# Patient Record
Sex: Female | Born: 1935 | Race: White | Hispanic: No | Smoking: Never smoker
Health system: Southern US, Community
[De-identification: ages and names within clinical notes are randomized; demographics above are authoritative.]

## PROBLEM LIST (undated history)

## (undated) DIAGNOSIS — I10 Essential (primary) hypertension: Secondary | ICD-10-CM

## (undated) DIAGNOSIS — Z8241 Family history of sudden cardiac death: Secondary | ICD-10-CM

## (undated) DIAGNOSIS — E785 Hyperlipidemia, unspecified: Secondary | ICD-10-CM

## (undated) DIAGNOSIS — I5032 Chronic diastolic (congestive) heart failure: Secondary | ICD-10-CM

## (undated) DIAGNOSIS — E559 Vitamin D deficiency, unspecified: Secondary | ICD-10-CM

## (undated) DIAGNOSIS — H3320 Serous retinal detachment, unspecified eye: Secondary | ICD-10-CM

## (undated) DIAGNOSIS — I4819 Other persistent atrial fibrillation: Secondary | ICD-10-CM

## (undated) DIAGNOSIS — I8310 Varicose veins of unspecified lower extremity with inflammation: Secondary | ICD-10-CM

## (undated) DIAGNOSIS — I6789 Other cerebrovascular disease: Secondary | ICD-10-CM

## (undated) DIAGNOSIS — I6529 Occlusion and stenosis of unspecified carotid artery: Secondary | ICD-10-CM

## (undated) HISTORY — DX: Other cerebrovascular disease: I67.89

## (undated) HISTORY — DX: Serous retinal detachment, unspecified eye: H33.20

## (undated) HISTORY — DX: Hyperlipidemia, unspecified: E78.5

## (undated) HISTORY — PX: OTHER SURGICAL HISTORY: SHX169

## (undated) HISTORY — DX: Other persistent atrial fibrillation: I48.19

## (undated) HISTORY — DX: Varicose veins of unspecified lower extremity with inflammation: I83.10

## (undated) HISTORY — PX: RETINAL DETACHMENT SURGERY: SHX105

## (undated) HISTORY — PX: TONSILLECTOMY: SUR1361

## (undated) HISTORY — DX: Family history of sudden cardiac death: Z82.41

## (undated) HISTORY — PX: CATARACT EXTRACTION: SUR2

---

## 1981-02-18 HISTORY — PX: CHOLECYSTECTOMY: SHX55

## 1998-04-07 ENCOUNTER — Emergency Department (HOSPITAL_COMMUNITY): Admission: EM | Admit: 1998-04-07 | Discharge: 1998-04-07 | Payer: Self-pay | Admitting: Emergency Medicine

## 1998-04-07 ENCOUNTER — Encounter: Payer: Self-pay | Admitting: Emergency Medicine

## 1998-10-26 ENCOUNTER — Other Ambulatory Visit: Admission: RE | Admit: 1998-10-26 | Discharge: 1998-10-26 | Payer: Self-pay | Admitting: *Deleted

## 2001-03-19 ENCOUNTER — Other Ambulatory Visit: Admission: RE | Admit: 2001-03-19 | Discharge: 2001-03-19 | Payer: Self-pay | Admitting: *Deleted

## 2003-03-16 DIAGNOSIS — I8310 Varicose veins of unspecified lower extremity with inflammation: Secondary | ICD-10-CM

## 2003-03-16 HISTORY — DX: Varicose veins of unspecified lower extremity with inflammation: I83.10

## 2005-07-23 ENCOUNTER — Emergency Department (HOSPITAL_COMMUNITY): Admission: EM | Admit: 2005-07-23 | Discharge: 2005-07-23 | Payer: Self-pay | Admitting: Emergency Medicine

## 2006-07-25 DIAGNOSIS — E669 Obesity, unspecified: Secondary | ICD-10-CM | POA: Insufficient documentation

## 2008-11-29 DIAGNOSIS — Z8241 Family history of sudden cardiac death: Secondary | ICD-10-CM

## 2008-11-29 HISTORY — DX: Family history of sudden cardiac death: Z82.41

## 2009-01-22 ENCOUNTER — Ambulatory Visit: Payer: Self-pay | Admitting: Family Medicine

## 2009-01-22 ENCOUNTER — Inpatient Hospital Stay (HOSPITAL_COMMUNITY): Admission: EM | Admit: 2009-01-22 | Discharge: 2009-01-26 | Payer: Self-pay | Admitting: Family Medicine

## 2009-01-25 ENCOUNTER — Ambulatory Visit: Payer: Self-pay | Admitting: Emergency Medicine

## 2009-05-16 DIAGNOSIS — E559 Vitamin D deficiency, unspecified: Secondary | ICD-10-CM

## 2009-05-16 HISTORY — DX: Vitamin D deficiency, unspecified: E55.9

## 2009-06-23 ENCOUNTER — Emergency Department (HOSPITAL_COMMUNITY): Admission: EM | Admit: 2009-06-23 | Discharge: 2009-06-23 | Payer: Self-pay | Admitting: Emergency Medicine

## 2009-06-27 DIAGNOSIS — I6789 Other cerebrovascular disease: Secondary | ICD-10-CM

## 2009-06-27 DIAGNOSIS — I10 Essential (primary) hypertension: Secondary | ICD-10-CM

## 2009-06-27 HISTORY — DX: Other cerebrovascular disease: I67.89

## 2009-06-27 HISTORY — DX: Essential (primary) hypertension: I10

## 2010-05-08 LAB — COMPREHENSIVE METABOLIC PANEL
AST: 22 U/L (ref 0–37)
Albumin: 3.7 g/dL (ref 3.5–5.2)
BUN: 13 mg/dL (ref 6–23)
Chloride: 103 mEq/L (ref 96–112)
Creatinine, Ser: 0.42 mg/dL (ref 0.4–1.2)
Potassium: 3.8 mEq/L (ref 3.5–5.1)
Total Bilirubin: 0.8 mg/dL (ref 0.3–1.2)
Total Protein: 7.1 g/dL (ref 6.0–8.3)

## 2010-05-08 LAB — URINALYSIS, ROUTINE W REFLEX MICROSCOPIC
Bilirubin Urine: NEGATIVE
Glucose, UA: NEGATIVE mg/dL
Specific Gravity, Urine: 1.008 (ref 1.005–1.030)
Urobilinogen, UA: 0.2 mg/dL (ref 0.0–1.0)
pH: 7.5 (ref 5.0–8.0)

## 2010-05-08 LAB — URINE MICROSCOPIC-ADD ON

## 2010-05-08 LAB — DIFFERENTIAL
Lymphs Abs: 1.3 10*3/uL (ref 0.7–4.0)
Monocytes Absolute: 0.5 10*3/uL (ref 0.1–1.0)
Monocytes Relative: 7 % (ref 3–12)
Neutro Abs: 4.9 10*3/uL (ref 1.7–7.7)
Neutrophils Relative %: 72 % (ref 43–77)

## 2010-05-08 LAB — CBC
HCT: 43.8 % (ref 36.0–46.0)
Hemoglobin: 14.7 g/dL (ref 12.0–15.0)
MCHC: 33.6 g/dL (ref 30.0–36.0)
Platelets: 214 10*3/uL (ref 150–400)
RDW: 13.6 % (ref 11.5–15.5)

## 2010-05-22 LAB — CBC
HCT: 36.4 % (ref 36.0–46.0)
HCT: 36.5 % (ref 36.0–46.0)
HCT: 37.5 % (ref 36.0–46.0)
Hemoglobin: 12.6 g/dL (ref 12.0–15.0)
MCHC: 33.7 g/dL (ref 30.0–36.0)
MCHC: 34.6 g/dL (ref 30.0–36.0)
MCV: 95.6 fL (ref 78.0–100.0)
MCV: 96.4 fL (ref 78.0–100.0)
MCV: 96.7 fL (ref 78.0–100.0)
Platelets: 171 10*3/uL (ref 150–400)
RBC: 3.78 MIL/uL — ABNORMAL LOW (ref 3.87–5.11)
RBC: 3.88 MIL/uL (ref 3.87–5.11)
WBC: 11.6 10*3/uL — ABNORMAL HIGH (ref 4.0–10.5)
WBC: 13.5 10*3/uL — ABNORMAL HIGH (ref 4.0–10.5)

## 2010-05-22 LAB — CARDIAC PANEL(CRET KIN+CKTOT+MB+TROPI)
CK, MB: 2 ng/mL (ref 0.3–4.0)
Total CK: 85 U/L (ref 7–177)

## 2010-05-22 LAB — DIFFERENTIAL
Eosinophils Absolute: 0.1 10*3/uL (ref 0.0–0.7)
Eosinophils Relative: 1 % (ref 0–5)
Lymphs Abs: 1.4 10*3/uL (ref 0.7–4.0)
Monocytes Relative: 12 % (ref 3–12)

## 2010-05-22 LAB — FUNGUS CULTURE W SMEAR: Fungal Smear: NONE SEEN

## 2010-05-22 LAB — BASIC METABOLIC PANEL
CO2: 28 mEq/L (ref 19–32)
Calcium: 8.4 mg/dL (ref 8.4–10.5)
Creatinine, Ser: 0.54 mg/dL (ref 0.4–1.2)
GFR calc non Af Amer: >60 mL/min (ref >60)

## 2010-05-22 LAB — LEGIONELLA ANTIGEN, URINE

## 2010-05-22 LAB — CULTURE, BLOOD (ROUTINE X 2): Culture: NO GROWTH

## 2010-05-22 LAB — MYCOPLASMA PNEUMONIAE ANTIBODY, IGM

## 2010-05-22 LAB — COCCIDIOIDES ANTIBODIES

## 2010-09-09 ENCOUNTER — Emergency Department (HOSPITAL_COMMUNITY): Payer: Medicare Other

## 2010-09-09 ENCOUNTER — Observation Stay (HOSPITAL_COMMUNITY)
Admission: EM | Admit: 2010-09-09 | Discharge: 2010-09-10 | Disposition: A | Discharge status: Home / Self Care | Payer: Medicare Other | Attending: Internal Medicine | Admitting: Internal Medicine

## 2010-09-09 DIAGNOSIS — E785 Hyperlipidemia, unspecified: Secondary | ICD-10-CM | POA: Insufficient documentation

## 2010-09-09 DIAGNOSIS — J4489 Other specified chronic obstructive pulmonary disease: Secondary | ICD-10-CM | POA: Insufficient documentation

## 2010-09-09 DIAGNOSIS — J449 Chronic obstructive pulmonary disease, unspecified: Secondary | ICD-10-CM | POA: Insufficient documentation

## 2010-09-09 DIAGNOSIS — G319 Degenerative disease of nervous system, unspecified: Secondary | ICD-10-CM | POA: Insufficient documentation

## 2010-09-09 DIAGNOSIS — R55 Syncope and collapse: Principal | ICD-10-CM | POA: Insufficient documentation

## 2010-09-09 DIAGNOSIS — I4949 Other premature depolarization: Secondary | ICD-10-CM | POA: Insufficient documentation

## 2010-09-09 DIAGNOSIS — I517 Cardiomegaly: Secondary | ICD-10-CM | POA: Insufficient documentation

## 2010-09-09 LAB — URINALYSIS, ROUTINE W REFLEX MICROSCOPIC
Glucose, UA: NEGATIVE mg/dL
Hgb urine dipstick: NEGATIVE
Protein, ur: NEGATIVE mg/dL

## 2010-09-09 LAB — CBC
Hemoglobin: 15.1 g/dL — ABNORMAL HIGH (ref 12.0–15.0)
MCH: 32.2 pg (ref 26.0–34.0)
MCV: 93.8 fL (ref 78.0–100.0)
Platelets: 248 10*3/uL (ref 150–400)
RBC: 4.69 MIL/uL (ref 3.87–5.11)

## 2010-09-09 LAB — CK TOTAL AND CKMB (NOT AT ARMC)
Relative Index: INVALID (ref 0.0–2.5)
Total CK: 52 U/L (ref 7–177)

## 2010-09-09 LAB — COMPREHENSIVE METABOLIC PANEL
Albumin: 3.8 g/dL (ref 3.5–5.2)
BUN: 13 mg/dL (ref 6–23)
Calcium: 8.9 mg/dL (ref 8.4–10.5)
Glucose, Bld: 103 mg/dL — ABNORMAL HIGH (ref 70–99)
Sodium: 139 mEq/L (ref 135–145)
Total Protein: 8 g/dL (ref 6.0–8.3)

## 2010-09-09 LAB — DIFFERENTIAL
Eosinophils Absolute: 0 10*3/uL (ref 0.0–0.7)
Lymphs Abs: 1.5 10*3/uL (ref 0.7–4.0)
Monocytes Relative: 7 % (ref 3–12)
Neutrophils Relative %: 71 % (ref 43–77)

## 2010-09-09 LAB — CARDIAC PANEL(CRET KIN+CKTOT+MB+TROPI): Total CK: 46 U/L (ref 7–177)

## 2010-09-09 LAB — TROPONIN I: Troponin I: <0.3 ng/mL (ref <0.30)

## 2010-09-09 NOTE — H&P (Signed)
Julie Haas, Julie Haas              ACCOUNT NO.:  [id number]  MEDICAL RECORD NO.:  [id number]  LOCATION:  WLED                         FACILITY:  [facility]  PHYSICIAN:  Tana Felts, MD     DATE OF BIRTH:  05-07-1925  DATE OF ADMISSION:  09/09/2010 DATE OF DISCHARGE:                             HISTORY & PHYSICAL   CHIEF COMPLAINT:  Syncopal event.  HISTORY OF PRESENT ILLNESS:  This is a fairly healthy 77 year old woman with no significant past medical history who presents today after a 2- minute episode of staring and unresponsiveness at church today.  She remembers the preacher asking everyone to bow their heads in prayer and woke up about 2 minutes later having been lowered to the floor.  She denies any fall.  Apparently her husband was unable to get her attention to get her to wake up.  Her eyes remained open and, as mentioned, she sustained no trauma.  She denies any sort of prodromal syndrome so when she woke up she felt hot, sweaty and said she was seeing stars.  She did have breakfast this morning including her regular coffee which she drinks every day.  She says almost immediately after the episode she felt fine and was attended to by three physicians who were in the church congregation at the time.  She denies any chest pain, trouble breathing, or abdominal symptoms, and says she is at her baseline right now.  REVIEW OF SYSTEMS:  10 system review of systems is otherwise negative.  PAST MEDICAL HISTORY:  Cataract surgery, detached retina, hyperlipidemia, pneumonia, PVCs, cholecystectomy, and tonsillectomy.  SOCIAL HISTORY:  The patient does not smoke or drink.  She lives in [facility] with her husband and is fairly independent.  FAMILY HISTORY:  Multiple female relatives have died in their 47s or younger of a genetic heart condition.  She says it predominately affects men.  Travel history, nothing recent but she has traveled widely in [place] in the  past.  ALLERGIES:  PENICILLIN, CODEINE, and STATINS.  MEDICATIONS:  No home medications.  PHYSICAL EXAM:  VITAL SIGNS:  Temperature 98.1, pulse 68, blood pressure 156/77, respiratory rate 18, saturating 99% on room air. GENERAL:  This is a well-nourished, well-developed, well-groomed and pleasant elderly woman in no acute distress. HEENT:  Pupils are equal, round, reactive to light.  There is a mild irregularity in the left pupil secondary to ophthalmic surgery.  She has moist mucous membranes.  Oropharynx is clear. NECK:  Supple.  No lymphadenopathy. LUNGS:  Clear to auscultation bilaterally. CARDIOVASCULAR:  Regular rate and rhythm.  No murmurs, rubs or gallops although heart sounds are somewhat soft.  ABDOMEN:  Soft, nontender, nondistended.  Normal bowel sounds. EXTREMITIES:  Warm, well-perfused.  No cyanosis, clubbing or edema except for possibly some very mild pedal edema. NEUROLOGIC:  Cranial nerves are intact and symmetric.  She has 5/5 strength in all extremities.  Sensation is intact and her cognition is excellent.  LABORATORY DATA:  One set of cardiac enzymes was normal.  White count 6.6, hemoglobin 15.1, platelets 248, normal differential.  UA was normal.  Chemistries were normal. Chest x-ray:  Mild cardiomegaly and pulmonary hyperinflation, but no  active process. Head CT:  No acute intracranial abnormality, mild-to-moderate generalized atrophy, and mild chronic microvascular ischemic changes which are stable since an exam done in 2007.  ASSESSMENT:  This is an 77 year old woman in generally good health who presents today after an episode of staring and unresponsiveness in church.  Based on her symptoms of sweating and warmth after she awakened, I suspect this is a vagal event although it is unusual that she did not have a prodrome.  It is also possible that she had arrhythmias that were self-limited given her history of PVCs.  She does drink coffee which can  provoke tachyarrhythmias especially in the elderly.  The description somewhat like a petit mal seizure but these are very uncommon to onset in an elderly patient with no prior history of seizures, and if workup is negative and these events recur, I think it might be worthwhile to work them up but I am not sure that it would be useful at this time given her completely normal neuro exam.  PLAN: 1. Admit for observation on telemetry to evaluate for any kind of     arrhythmia.  She might be a candidate for an event monitor in the     future. 2. Echocardiogram to rule out any structural heart disease.  She does     have cardiomegaly on her chest x-ray. 3. Seizure precautions:  Though again I doubt this is a seizure.  At     this point, I have not ordered an EEG but it might be a reasonable     course of action should this is event recur.     Tana Felts, MD     NB/MEDQ  D:  09/09/2010  T:  09/09/2010  Job:  [id number]  Electronically Signed by Tana Felts M.D. on 09/09/2010 11:04:20 PM

## 2010-09-10 DIAGNOSIS — R55 Syncope and collapse: Secondary | ICD-10-CM

## 2010-09-13 NOTE — Discharge Summary (Addendum)
Julie Haas, Julie Haas              ACCOUNT NO.:  [id number]  MEDICAL RECORD NO.:  [id number]  LOCATION:  1401                         FACILITY:  [facility]  PHYSICIAN:  Hartley Barefoot, MD    DATE OF BIRTH:  [birth year]  DATE OF ADMISSION:  09/09/2010 DATE OF DISCHARGE:  09/10/2010                              DISCHARGE SUMMARY   PRIMARY CARE PROVIDER:  Lenon Curt. Chilton Si, M.D.  DISCHARGE DIAGNOSES: 1. Syncope. 2. History of PVCs. 3. Hyperlipidemia.  DISCHARGE MEDICATIONS:  None.  DIAGNOSTIC LABS:  WBCs 6.6, hemoglobin 15.1, hematocrit 44.0, platelets 248,000.  Sodium 139, potassium 3.8, chloride 101, CO2 30, BUN 13, creatinine 0.47.  Urinalysis was negative.  First set of cardiac enzymes; total CK 52, CK-MB 2.4, troponin I less than 0.30.  Second set; total CK 46, CK-MB 2.1, troponin I less and 0.30.  MRSA PCR screening negative.  Magnesium 2.0.  DIAGNOSTIC IMAGING: 1. Chest x-ray done on September 09, 2010 yields stable cardiomegaly and     COPD.  No active disease. 2. CT of the head without contrast yields no acute intracranial     abnormality.  Mild to moderate generalized atrophy and mild chronic     microvascular ischemic changes of the white matter, stable since     2007. 3. A 2D echo done on September 10, 2010 yields an ejection fraction of 60%     to 65%, grade 1 diastolic dysfunction.  CONSULTATIONS:  Rollene Rotunda, MD, [facility] with [place], cardiology.  PROCEDURES:  None.  BRIEF HISTORY:  Julie Haas is a fairly healthy 77 year old female with no significant past medical history, presented to the [facility] ED on September 09, 2010 after a 2-minute episode of staring and unresponsiveness at [place].  She remembers the preacher asked them to bow their head and prayer, and woke up about 2 minutes later have been lower to the floor. She denies any fall.  Apparently, her husband was unable to get her attention to get her to wake up.  Her eyes remained open and as mentioned, she  sustained no trauma.  She denies any short of predominant syndrome, so when she woke up she felt hot, sweaty, and said she was seeing stars.  She did have breakfast at morning including a regular coffee which she drinks every day.  She indicated that most immediately after the episode, she felt fine, was attended 2 by 3 physicians who are at [facility] in the [place] of the time.  She denied any chest pain, trouble breathing.  [facility] were asked to admit her for further evaluation and treatment.  HOSPITAL COURSE BY PROBLEMS: 1. Syncope.  The patient was admitted to telemetry.  Etiology unknown,     there is a concern for arrhythmia, so cardiology was asked to     consult.  The patient does have a history of PVCs.  Mag level was     checked and stated as above.  A 2D echo was done, as stated above.     EKG showed a borderline abnormal QT segment, but otherwise     unremarkable.  The patient was monitored on telemetry during her     hospitalization, and did have  one run of 6 to 7 PVCs.  She was     asymptomatic.  She was seen by cardiology as stated above and an     outpatient stress test scheduled for September 18, 2010 at 9:15.  Her     cardiac enzymes have been negative as stated above.  At the time of     this dictation, patient has had no further episodes. 2. History of PVCs, please see #1. 3. Hyperlipidemia.  The patient is currently not on any medication.     Will follow up with primary care provider for a fasting lipid     panel.  PHYSICAL EXAMINATION:  VITAL SIGNS:  Temperature 98.7, blood pressure 160/82, heart rate 78, respirations 20, sats 93% on room air. GENERAL:  Awake, alert, sitting on the side of bed. CV:  Regular rate and rhythm.  No murmur, gallop, or rub. EXTREMITIES:  Trace lower extremity edema.  Pedal pulses present palpable. RESPIRATORY:  Normal effort.  Breath sounds clear to auscultation bilaterally.  No rhonchi, wheezes, or rales. ABDOMEN:  Soft.   Positive bowel sounds throughout.  Nontender to palpation. NEURO:  Alert and oriented x3.  Speech clear.  Facial symmetry.  ACTIVITY:  As tolerated.  DIET:  Regular.  FOLLOWUP:  She will see Dr. Antoine Poche on October 16, 2010 at 12 noon.  She will have a stress test on September 18, 2010 at 09:15 with [place], Cardiology.  DISPOSITION:  The patient is being discharged to home.  CONDITION ON DISCHARGE:  She is medically stable.  Time spent on this discharge 30 minutes.     Gwenyth Bender, NP   ______________________________ Hartley Barefoot, MD    KMB/MEDQ  D:  09/10/2010  T:  09/10/2010  Job:  [id number]  cc:   Lenon Curt. Chilton Si, M.D. Fax: [phone]  Electronically Signed by Hartley Barefoot MD on 09/13/2010 09:40:48 PM Electronically Signed by Toya Smothers  on 09/16/2010 07:35:54 AM

## 2010-09-14 NOTE — Consult Note (Signed)
Julie Haas, Julie Haas              ACCOUNT NO.:  [id number]  MEDICAL RECORD NO.:  [id number]  LOCATION:  1401                         FACILITY:  [facility]  PHYSICIAN:  Rollene Rotunda, MD, FACCDATE OF BIRTH:  07/10/1925  DATE OF CONSULTATION:  09/10/2010 DATE OF DISCHARGE:  09/10/2010                                CONSULTATION   PRIMARY CARE PHYSICIAN:  Lenon Curt. Chilton Si, M.D.  REASON FOR CONSULTATION:  The patient with syncope.  HISTORY OF PRESENT ILLNESS:  The patient is a lovely 77 year old who has only past cardiac history, apparently includes some PVCs.  This was noted incidentally and she has not had a workup.  She coincidently does have family history in some paternal cousin who has long QT syndrome. She, however, is never up to this point had any problems with presyncope or syncope.  There has been no sudden death in her immediate first- degree relatives.  She was at church.  They have closed their eyes to pray.  Her husband looked over afterwards, and her eyes were open and she was not responding.  She did not fall to the ground.  There was no trauma, seizure activity, or loss of bowel or bladder.  They laid her down on some chairs.  She apparently was somewhat unresponsive for couple of minutes, then her eyes open.  When she awoke, she knew she was in church.  She had no palpitations.  She has had no chest pressure, neck or arm discomfort.  She has had no orthostatic symptoms.  In the emergency room, she has had no enzyme elevations x2.  Head CT demonstrated no acute abnormalities.  EKG just done demonstrated borderline QT prolongation, but no other significant arrhythmias. Echocardiography which we just reviewed was essentially normal.  She has had no further presyncope or syncope.  She has no shortness of breath, PND, or orthopnea.  Of note, she does have ventricular ectopy on telemetry and has had a couple of 6 and 70 runs of nonsustained ventricular tachycardia.  These  have been asymptomatic.  PAST MEDICAL HISTORY: 1. Hyperlipidemia. 2. PVCs. 3. Retinal detachment.  PAST SURGICAL HISTORY: 1. Cholecystectomy. 2. Cataract surgery. 3. Tonsillectomy.  ALLERGIES/INTOLERANCES: 1. PENICILLIN. 2. CODEINE.  MEDICATIONS:  None.  SOCIAL HISTORY:  The patient is married.  She has two children and two grandchildren.  She never smoked cigarettes and does not drink alcohol.  FAMILY HISTORY:  Noncontributory for long QT in first-degree relatives, but she describes sudden death in her paternal grandfather's family. This was a third cousin who died at age 74.  There was another distant cousin who died at age 76.  There has been apparently genetic testing in this family and they have been found to have long QT.  Her father had his first heart attack around in his 14s and died at age 33.  Her brother had his first heart attack at age 52 and died in early age as well.  REVIEW OF SYSTEMS:  As stated in the HPI and negative for all other systems.  PHYSICAL EXAMINATION:  GENERAL:  The patient is in no distress. VITAL SIGNS:  Blood pressure 160/82, heart rate 78 and regular, room air saturation 95%,  afebrile, respiratory rate 16. HEENT:  Eyelids are unremarkable, pupils equal and reactive to light, fundi not visualized.  Oral mucosa unremarkable. NECK:  No jugular venous distention at 45 degrees.  Carotid upstroke brisk and symmetrical.  No bruits, no thyromegaly. LYMPHATICS:  No cervical, axillary, inguinal lymph nodes. LUNGS:  Clear to auscultation bilaterally. BACK:  No costovertebral angle tenderness. CHEST:  Unremarkable. HEART:  PMI not displaced or sustained.  S1-S2 within normal limits.  No S3-S4.  No clicks, no rubs, no murmurs. ABDOMEN:  Flat.  Positive bowel sounds, normal in frequency and pitch. No bruits, rebound, guarding.  No midline pulsatile mass.  No splenomegaly. SKIN:  No rashes, nodules. EXTREMITIES:  2+ pulses throughout.  No edema,  no cyanosis, no clubbing. NEURO:  Oriented to person, place, and time.  Cranial nerves II through XII grossly intact.  Motor grossly intact throughout.  LABS:  Sodium 139, potassium 3.8, BUN 13, creatinine 0.47.  WBCs 6.6, hemoglobin 15.1, platelets 248,000.  Chest x-ray, no acute disease.  EKG; sinus rhythm, rate 76, axis within normal limits, QTC slightly prolonged 470, no acute ST-T wave changes.  ASSESSMENT/PLAN: 1. Loss of consciousness.  The patient has an interesting distant     family history.  She does have ventricular ectopy.  However, at     this point, I cannot put with 2 together as the etiology for loss     of consciousness.  I do plan outpatient evaluation.  Given her risk     factors and ventricular ectopy, a stress perfusion study will be     ordered.  On that same day, apply a 21-day event monitor.  I will     then follow her in the clinic for further     evaluation of her ectopy episode.  I do think followup with her     primary is indicated and any neurologic evaluation if already     planned, should proceed. 2. Long QT, again this was noted.  She should avoid any QT prolonging     drugs and I will evaluate as above.     Rollene Rotunda, MD, [facility]     JH/MEDQ  D:  09/10/2010  T:  09/10/2010  Job:  [id number]  cc:   Lenon Curt. Chilton Si, M.D. Fax: [phone]  Electronically Signed by Rollene Rotunda MD [facility] on 09/14/2010 07:51:45 PM

## 2010-09-18 ENCOUNTER — Encounter (HOSPITAL_COMMUNITY): Payer: Medicare Other | Admitting: Radiology

## 2010-09-18 ENCOUNTER — Telehealth: Payer: Self-pay

## 2010-10-01 ENCOUNTER — Telehealth: Payer: Self-pay

## 2010-10-01 NOTE — Telephone Encounter (Signed)
Call Patient for monitor ,she was very upset that no one has call her about what was find when she was in the hospital so she said she would like to talk to Dr green before  she has this monitor put on her. Per patient said she  Will tell Dr green office to call back if she still need monitor.l

## 2010-10-01 NOTE — Telephone Encounter (Signed)
Talk to patient

## 2010-10-01 NOTE — Telephone Encounter (Deleted)
canc

## 2010-10-08 ENCOUNTER — Encounter: Payer: Self-pay | Admitting: Cardiology

## 2010-10-09 ENCOUNTER — Ambulatory Visit (INDEPENDENT_AMBULATORY_CARE_PROVIDER_SITE_OTHER): Payer: Medicare Other | Admitting: Cardiology

## 2010-10-09 ENCOUNTER — Encounter: Payer: Self-pay | Admitting: Cardiology

## 2010-10-09 DIAGNOSIS — R55 Syncope and collapse: Secondary | ICD-10-CM | POA: Insufficient documentation

## 2010-10-09 DIAGNOSIS — I10 Essential (primary) hypertension: Secondary | ICD-10-CM | POA: Insufficient documentation

## 2010-10-09 NOTE — Progress Notes (Signed)
HPI The patient presents for follow up after a syncopal episode.  I saw her in the hospital.  Echo was OK and no etiology was identified.  She was to have a stress test and event monitor but she canceled these. Since I last saw her she has done well.  The patient denies any new symptoms such as chest discomfort, neck or arm discomfort. There has been no new shortness of breath, PND or orthopnea. There have been no reported palpitations, presyncope or syncope.  She takes care of her aged husband and walks 1/4 mile to the dining hall frequently.   Allergies  Allergen Reactions  . Penicillins     No current outpatient prescriptions on file.    Past Medical History  Diagnosis Date  . Hyperlipidemia   . Arrhythmia     PVC  . Retinal detachment     Past Surgical History  Procedure Date  . Cholecystectomy   . Cataract extraction   . Tonsillectomy     ROS:  As stated in the HPI and negative for all other systems.  PHYSICAL EXAM BP 159/90  Pulse 74  Ht 5\' 5"  (1.651 m)  Wt 196 lb (88.905 kg)  BMI 32.62 kg/m2 GENERAL:  Well appearing HEENT:  Pupils equal round and reactive, fundi not visualized, oral mucosa unremarkable NECK:  No jugular venous distention, waveform within normal limits, carotid upstroke brisk and symmetric, no bruits, no thyromegaly LYMPHATICS:  No cervical, inguinal adenopathy LUNGS:  Clear to auscultation bilaterally BACK:  No CVA tenderness CHEST:  Unremarkable HEART:  PMI not displaced or sustained,S1 and S2 within normal limits, no S3, no S4, no clicks, no rubs, no murmurs ABD:  Flat, positive bowel sounds normal in frequency in pitch, no bruits, no rebound, no guarding, no midline pulsatile mass, no hepatomegaly, no splenomegaly EXT:  2 plus pulses throughout, no edema, no cyanosis no clubbing SKIN:  No rashes no nodules NEURO:  Cranial nerves II through XII grossly intact, motor grossly intact throughout PSYCH:  Cognitively intact, oriented to person place  and time  ASSESSMENT AND PLAN

## 2010-10-09 NOTE — Assessment & Plan Note (Signed)
Her blood pressure is elevated today. She gets it checked occasionally at home but doesn't think it's high. I have given her instructions on how to keep a blood pressure diary. I would be happy to review this.

## 2010-10-09 NOTE — Assessment & Plan Note (Signed)
The patient has had no further cardiac symptoms or syncopal events.  No further work up is indicated at this point.

## 2010-10-16 ENCOUNTER — Encounter: Payer: Medicare Other | Admitting: Cardiology

## 2011-02-03 ENCOUNTER — Emergency Department (HOSPITAL_COMMUNITY)
Admission: EM | Admit: 2011-02-03 | Discharge: 2011-02-03 | Discharge status: Against Medical Advice | Payer: Medicare Other | Attending: Emergency Medicine | Admitting: Emergency Medicine

## 2011-02-03 ENCOUNTER — Encounter (HOSPITAL_COMMUNITY): Payer: Self-pay

## 2011-02-03 DIAGNOSIS — R5381 Other malaise: Secondary | ICD-10-CM | POA: Insufficient documentation

## 2011-02-03 DIAGNOSIS — R5383 Other fatigue: Secondary | ICD-10-CM | POA: Insufficient documentation

## 2011-02-03 NOTE — ED Notes (Signed)
MD at bedside. 

## 2011-02-03 NOTE — ED Notes (Signed)
Per ems- pt has had 3 falls since end of November, latest one being yesterday. Pt family thought he was more lethargic than usual today and having more weakness than usual and has been unable to stand. Pt family noted more left sided weakness, but EMS noted equal strengths bilaterally. Pt has been lethargic for EMS.

## 2011-02-03 NOTE — ED Notes (Signed)
Family at bedside. 

## 2011-02-13 ENCOUNTER — Emergency Department (HOSPITAL_COMMUNITY)
Admission: EM | Admit: 2011-02-13 | Discharge: 2011-02-13 | Disposition: A | Discharge status: Home / Self Care | Payer: Medicare Other | Attending: Emergency Medicine | Admitting: Emergency Medicine

## 2011-02-13 ENCOUNTER — Encounter (HOSPITAL_COMMUNITY): Payer: Self-pay | Admitting: *Deleted

## 2011-02-13 DIAGNOSIS — S0500XA Injury of conjunctiva and corneal abrasion without foreign body, unspecified eye, initial encounter: Secondary | ICD-10-CM

## 2011-02-13 DIAGNOSIS — X58XXXA Exposure to other specified factors, initial encounter: Secondary | ICD-10-CM | POA: Insufficient documentation

## 2011-02-13 DIAGNOSIS — H109 Unspecified conjunctivitis: Secondary | ICD-10-CM | POA: Insufficient documentation

## 2011-02-13 DIAGNOSIS — S058X9A Other injuries of unspecified eye and orbit, initial encounter: Secondary | ICD-10-CM | POA: Insufficient documentation

## 2011-02-13 MED ORDER — TOBRAMYCIN 0.3 % OP SOLN
2.0000 [drp] | Freq: Once | OPHTHALMIC | Status: AC
  Administered 2011-02-13: 2 [drp] via OPHTHALMIC
  Filled 2011-02-13: qty 5

## 2011-02-13 NOTE — ED Notes (Signed)
Pt states "I woke up around 0530 with my right eye hurting, my husband is dying & I've been with him a lot"; pt presents with ROD redness, pt describes "as it feels like grit is in it"

## 2011-02-13 NOTE — ED Notes (Signed)
Mother states "she has a nebulizer @ home, was on prednisone x 2 wks ago, but she started coughing on Christmas Eve"

## 2011-02-13 NOTE — ED Provider Notes (Signed)
History     CSN: [id number]  Arrival date & time 02/13/11  0717   First MD Initiated Contact with Patient 02/13/11 [phone]      Chief Complaint  Patient presents with  . Eye Pain    (Consider location/radiation/quality/duration/timing/severity/associated sxs/prior treatment) Patient is a 77 y.o. female presenting with eye pain.  Eye Pain  pt c/o right eye pain, irritation, redness and scant drainage since this morning. Pt says felt as if sand or 'grit' was in eye. No change in vision. No headache. No nv. No nasal congestion, cough or uri symptoms. No fever or chills. No specific exacerbating or allev factors.   Past Medical History  Diagnosis Date  . Hyperlipidemia   . Arrhythmia     PVC  . Retinal detachment   . Renal disorder     Past Surgical History  Procedure Date  . Cholecystectomy   . Cataract extraction   . Tonsillectomy   . Keratosis     Family History  Problem Relation Age of Onset  . Arrhythmia      Long QT in first degree relatives,  . Heart attack Father 40  . Heart attack Brother 31    History  Substance Use Topics  . Smoking status: Never Smoker   . Smokeless tobacco: Not on file  . Alcohol Use: No    OB History    Grav Para Term Preterm Abortions TAB SAB Ect Mult Living                  Review of Systems  Constitutional: Negative for fever and chills.  Eyes: Positive for pain. Negative for visual disturbance.  Respiratory: Negative for cough.   Skin: Negative for rash.    Allergies  Codeine; Diuretic; Lipitor; Penicillins; and Pravachol  Home Medications  No current outpatient prescriptions on file.  BP 173/87  Pulse 93  Temp(Src) 98.6 F (37 C) (Oral)  Resp 16  Wt 195 lb (88.451 kg)  SpO2 93%  Physical Exam  Nursing note and vitals reviewed. Constitutional: She is oriented to person, place, and time. She appears well-developed and well-nourished. No distress.  HENT:  Head: Atraumatic.  Eyes: Pupils are equal, round,  and reactive to light. No scleral icterus.       Right conj injected, scant matting on lashes. Pupils equal, 3-4 mm, briskly reactive. No corneal clouding.  w fluorescein staining, small right corneal abrasion. Lid everted, no fb seen.   Neck: Neck supple. No tracheal deviation present.  Cardiovascular: Normal rate.   Pulmonary/Chest: Effort normal. No respiratory distress.  Abdominal: Normal appearance. She exhibits no distension.  Musculoskeletal: She exhibits no edema.  Neurological: She is alert and oriented to person, place, and time.  Skin: Skin is warm and dry. No rash noted.       No facial skin rash/shingles noted.   Psychiatric: She has a normal mood and affect.    ED Course  Procedures (including critical care time)     MDM  Tetracaine - relief of symptoms. Tonopen, IOP on right 15. Pt without corrective glasses, [facility] noted. As conjunctivitis, small corneal abrasion will rx tobrex drops.          Suzi Roots, MD 02/13/11 [phone]

## 2011-07-20 ENCOUNTER — Encounter: Payer: Self-pay | Admitting: Family Medicine

## 2011-07-20 ENCOUNTER — Ambulatory Visit (INDEPENDENT_AMBULATORY_CARE_PROVIDER_SITE_OTHER): Payer: Medicare Other | Admitting: Family Medicine

## 2011-07-20 VITALS — BP 164/86 | HR 72 | Temp 97.6°F | Resp 16 | Ht 64.5 in | Wt 193.6 lb

## 2011-07-20 DIAGNOSIS — R55 Syncope and collapse: Secondary | ICD-10-CM

## 2011-07-20 DIAGNOSIS — R5381 Other malaise: Secondary | ICD-10-CM

## 2011-07-20 DIAGNOSIS — R269 Unspecified abnormalities of gait and mobility: Secondary | ICD-10-CM

## 2011-07-20 DIAGNOSIS — R5383 Other fatigue: Secondary | ICD-10-CM

## 2011-07-20 DIAGNOSIS — R2681 Unsteadiness on feet: Secondary | ICD-10-CM

## 2011-07-20 LAB — COMPREHENSIVE METABOLIC PANEL
ALT: 14 U/L (ref 0–35)
AST: 22 U/L (ref 0–37)
Albumin: 3.7 g/dL (ref 3.5–5.2)
Alkaline Phosphatase: 72 U/L (ref 39–117)
BUN: 13 mg/dL (ref 6–23)
CO2: 29 mEq/L (ref 19–32)
Calcium: 9 mg/dL (ref 8.4–10.5)
Chloride: 103 mEq/L (ref 96–112)
Creat: 0.71 mg/dL (ref 0.50–1.10)
Glucose, Bld: 124 mg/dL — ABNORMAL HIGH (ref 70–99)
Potassium: 4 mEq/L (ref 3.5–5.3)
Sodium: 139 mEq/L (ref 135–145)
Total Bilirubin: 0.3 mg/dL (ref 0.3–1.2)
Total Protein: 7.1 g/dL (ref 6.0–8.3)

## 2011-07-20 LAB — POCT CBC
Granulocyte percent: 54.4 %G (ref 37–80)
HCT, POC: 40 % (ref 37.7–47.9)
Hemoglobin: 12.9 g/dL (ref 12.2–16.2)
Lymph, poc: 1.8 (ref 0.6–3.4)
MCH, POC: 30.3 pg (ref 27–31.2)
MCHC: 32.3 g/dL (ref 31.8–35.4)
MCV: 93.8 fL (ref 80–97)
MID (cbc): 0.4 (ref 0–0.9)
MPV: 9.7 fL (ref 0–99.8)
POC Granulocyte: 2.6 (ref 2–6.9)
POC LYMPH PERCENT: 37.8 %L (ref 10–50)
POC MID %: 7.8 %M (ref 0–12)
Platelet Count, POC: 226 10*3/uL (ref 142–424)
RBC: 4.26 M/uL (ref 4.04–5.48)
RDW, POC: 15.6 %
WBC: 4.7 10*3/uL (ref 4.6–10.2)

## 2011-07-20 LAB — POCT UA - MICROSCOPIC ONLY
Casts, Ur, LPF, POC: NEGATIVE
Crystals, Ur, HPF, POC: NEGATIVE
Mucus, UA: NEGATIVE
Yeast, UA: NEGATIVE

## 2011-07-20 LAB — POCT URINALYSIS DIPSTICK
Bilirubin, UA: NEGATIVE
Glucose, UA: NEGATIVE
Ketones, UA: NEGATIVE
Leukocytes, UA: NEGATIVE
Nitrite, UA: NEGATIVE
Protein, UA: NEGATIVE
Spec Grav, UA: 1.01
Urobilinogen, UA: 0.2
pH, UA: 6.5

## 2011-07-20 LAB — VITAMIN B12: Vitamin B-12: 665 pg/mL (ref 211–911)

## 2011-07-20 LAB — TSH: TSH: 2.16 u[IU]/mL (ref 0.350–4.500)

## 2011-07-20 NOTE — Progress Notes (Signed)
78 yo woman in usual state of health until Thursday when she felt something was wrong.  She had nurse at [facility] check her and nothing abnormal was found. Today patient was at program outside.  She felt funny, went inside to the bathroom stall and fell on the toilet seat briefly losing consciousness.  She got back up and spoke with the nurse.    She is not seeing double or feeling dizzy.  Objective:  Alert, articulate and cooperative. HEENT:  unremarkable Neck: no bruits, supple, no adenopathy Chest: clear, non tender, no ecchymoses Heart: reg, no murmur Skin: no ecch.  Ext: trace edema

## 2011-07-20 NOTE — Progress Notes (Signed)
Is an 78 year old woman, widowed this last January, who comes in for symptoms of syncope, and malaise.  She started feeling funny in her head on Thursday at friend's home last. She was evaluated by the nurse who found nothing wrong including a normal blood pressure. She went about her usual daily activities over the last 48 hours. Today she went to a program which was outdoors and after sitting in the sun for a while, she got up to go to the bathroom. When she went to the bathroom she says she lost consciousness and fell onto the toilet seat and immediately came to without hitting anything or injuring herself. She came back out of the bathroom and went to the nurse who said she didn't look well. She was checked by the nurse who didn't find anything wrong, but because of the syncopal symptoms, patient was sent over to our facility for further evaluation.  Patient denies chest pain, headache, weakness. She's had no recent cough, fever, urinary symptoms, abdominal pain, or shortness of breath.  She has a family history of arrhythmias. Currently she is being treated for high blood pressure.  Was widowed last January and she wonders if the stress of her husband's death could be related to this episode.  Objective: Very pleasant, cooperative woman in no distress.  HEENT: Unremarkable Neck: Supple, no adenopathy or thyromegaly, no bruits Chest: Clear to auscultation without ecchymosis Heart: Regular, no murmur or gallop Abdomen: Soft nontender Extremities: Unremarkable with no ecchymosis or bony abnormality. She demonstrates a stable gait. Extremities: 1+ edema of the feet. Skin: No ecchymoses noted, no abrasions. Neurological: Normal mental status, normal cranial nerves III through XII, symmetric motor exam and sensory exam. Patient has a wide-based gait but this seems stable.  EKG:  Normal sinus Results for orders placed in visit on 07/20/11  POCT CBC      Component Value Range   WBC 4.7  4.6 -  10.2 (K/uL)   Lymph, poc 1.8  0.6 - 3.4    POC LYMPH PERCENT 37.8  10 - 50 (%L)   MID (cbc) 0.4  0 - 0.9    POC MID % 7.8  0 - 12 (%M)   POC Granulocyte 2.6  2 - 6.9    Granulocyte percent 54.4  37 - 80 (%G)   RBC 4.26  4.04 - 5.48 (M/uL)   Hemoglobin 12.9  12.2 - 16.2 (g/dL)   HCT, POC 16.1  09.6 - 47.9 (%)   MCV 93.8  80 - 97 (fL)   MCH, POC 30.3  27 - 31.2 (pg)   MCHC 32.3  31.8 - 35.4 (g/dL)   RDW, POC 04.5     Platelet Count, POC 226  142 - 424 (K/uL)   MPV 9.7  0 - 99.8 (fL)  POCT URINALYSIS DIPSTICK      Component Value Range   Color, UA yellow     Clarity, UA clear     Glucose, UA neg     Bilirubin, UA neg     Ketones, UA neg     Spec Grav, UA 1.010     Blood, UA trace     pH, UA 6.5     Protein, UA neg     Urobilinogen, UA 0.2     Nitrite, UA neg     Leukocytes, UA Negative    POCT UA - MICROSCOPIC ONLY      Component Value Range   WBC, Ur, HPF, POC rare  RBC, urine, microscopic 0-2     Bacteria, U Microscopic trace     Mucus, UA neg     Epithelial cells, urine per micros 0-2     Crystals, Ur, HPF, POC neg     Casts, Ur, LPF, POC neg     Yeast, UA neg     Assessment: Vague symptoms without clear causality. I am at a loss to explain her symptoms at the same time I am somewhat concerned with the syncopal nature.  Plan: Start the aspirin daily and followup with Dr. Murray Hodgkins next week C. meds and thyroid and B12 are pending

## 2011-07-20 NOTE — Patient Instructions (Signed)
Several tests are pending.  I want you to see Dr. Chilton Si next week in follow up and we will be checking your pending labs in the next 24 hours   Syncope You have had a fainting (syncopal) spell. A fainting episode is a sudden, short-lived loss of consciousness. It results in complete recovery. It occurs because there has been a temporary shortage of oxygen and/or sugar (glucose) to the brain. CAUSES   Blood pressure pills and other medications that may lower blood pressure below normal. Sudden changes in posture (sudden standing).   Over-medication. Take your medications as directed.   Standing too long. This can cause blood to pool in the legs.   Seizure disorders.   Low blood sugar (hypoglycemia) of diabetes. This more commonly causes coma.   Bearing down to go to the bathroom. This can cause your blood pressure to rise suddenly. Your body compensates by making the blood pressure too low when you stop bearing down.   Hardening of the arteries where the brain temporarily does not receive enough blood.   Irregular heart beat and circulatory problems.   Fear, emotional distress, injury, sight of blood, or illness.  Your caregiver will send you home if the syncope was from non-worrisome causes (benign). Depending on your age and health, you may stay to be monitored and observed. If you return home, have someone stay with you if your caregiver feels that is desirable. It is very important to keep all follow-up referrals and appointments in order to properly manage this condition. This is a serious problem which can lead to serious illness and death if not carefully managed.  WARNING: Do not drive or operate machinery until your caregiver feels that it is safe for you to do so. SEEK IMMEDIATE MEDICAL CARE IF:   You have another fainting episode or faint while lying or sitting down. DO NOT DRIVE YOURSELF. Call 911 if no other help is available.   You have chest pain, are feeling sick to your  stomach (nausea), vomiting or abdominal pain.   You have an irregular heartbeat or one that is very fast (pulse over 120 beats per minute).   You have a loss of feeling in some part of your body or lose movement in your arms or legs.   You have difficulty with speech, confusion, severe weakness, or visual problems.   You become sweaty and/or feel light headed.  Make sure you are rechecked as instructed. Document Released: 02/04/2005 Document Revised: 01/24/2011 Document Reviewed: 09/25/2006 [facility] Patient Information 2012 [place], [place].

## 2011-11-17 ENCOUNTER — Encounter (HOSPITAL_COMMUNITY): Payer: Self-pay | Admitting: *Deleted

## 2011-11-17 ENCOUNTER — Observation Stay (HOSPITAL_COMMUNITY)
Admission: EM | Admit: 2011-11-17 | Discharge: 2011-11-18 | Disposition: A | Discharge status: Home / Self Care | Payer: Medicare Other | Attending: Internal Medicine | Admitting: Internal Medicine

## 2011-11-17 DIAGNOSIS — I4729 Other ventricular tachycardia: Secondary | ICD-10-CM | POA: Insufficient documentation

## 2011-11-17 DIAGNOSIS — R55 Syncope and collapse: Principal | ICD-10-CM | POA: Insufficient documentation

## 2011-11-17 DIAGNOSIS — I472 Ventricular tachycardia, unspecified: Secondary | ICD-10-CM | POA: Insufficient documentation

## 2011-11-17 DIAGNOSIS — I10 Essential (primary) hypertension: Secondary | ICD-10-CM | POA: Insufficient documentation

## 2011-11-17 LAB — POCT I-STAT, CHEM 8
BUN: 17 mg/dL (ref 6–23)
Calcium, Ion: 1.17 mmol/L (ref 1.13–1.30)
TCO2: 29 mmol/L (ref 0–100)

## 2011-11-17 LAB — POCT I-STAT TROPONIN I: Troponin i, poc: 0.01 ng/mL (ref 0.00–0.08)

## 2011-11-17 MED ORDER — ONDANSETRON HCL 4 MG/2ML IJ SOLN: 4.0000 mg | Freq: Three times a day (TID) | INTRAMUSCULAR | Status: DC | PRN

## 2011-11-17 NOTE — ED Notes (Signed)
Assisted Living  No c/o fever, feeling bad

## 2011-11-17 NOTE — ED Notes (Signed)
Patients only complaint is feeling hot.  Stated she was at a religious event and felt hot.  Had a syncopal episode several years back but not since then.l

## 2011-11-17 NOTE — ED Notes (Signed)
Patient was at a church event and became very hot and felt like she was going to pass out.  Became cool and clammy.  Denies passing out but felt light headed and very hot.  Stated this happened once before about 1 year ago.

## 2011-11-17 NOTE — ED Provider Notes (Addendum)
History     CSN: [id number]  Arrival date & time 11/17/11  2036   First MD Initiated Contact with Patient 11/17/11 2048      Chief Complaint  Patient presents with  . Near Syncope     HPI Patient was at a church event and became very hot and felt like she was going to pass out. Became cool and clammy. Denies passing out but felt light headed and very hot. Stated this happened once before about 1 year ago.   Past Medical History  Diagnosis Date  . Hyperlipidemia   . Arrhythmia     PVC  . Retinal detachment   . Renal disorder     Past Surgical History  Procedure Date  . Cholecystectomy   . Cataract extraction   . Tonsillectomy   . Keratosis     Family History  Problem Relation Age of Onset  . Arrhythmia      Long QT in first degree relatives,  . Heart attack Father 37  . Heart disease Father   . Heart attack Brother 31  . Heart disease Brother     History  Substance Use Topics  . Smoking status: Never Smoker   . Smokeless tobacco: Not on file  . Alcohol Use: No    OB History    Grav Para Term Preterm Abortions TAB SAB Ect Mult Living                  Review of Systems  All other systems reviewed and are negative.    Allergies  Codeine; Diuretic; Lipitor; Penicillins; and Pravachol  Home Medications   Current Outpatient Rx  Name Route Sig Dispense Refill  . LOSARTAN POTASSIUM 50 MG PO TABS Oral Take 50 mg by mouth daily.    Marland Kitchen VITAMIN E PO Oral Take 1 tablet by mouth daily.      BP 137/71  Pulse 71  Temp 98 F (36.7 C) (Oral)  Resp 21  SpO2 95%  Physical Exam  Nursing note and vitals reviewed. Constitutional: She is oriented to person, place, and time. She appears well-developed. No distress.  HENT:  Head: Normocephalic and atraumatic.  Eyes: Pupils are equal, round, and reactive to light.  Neck: Normal range of motion.  Cardiovascular: Normal rate and intact distal pulses.  Exam reveals no friction rub.   No murmur  heard. Pulmonary/Chest: No respiratory distress. She has no wheezes. She has no rales.  Abdominal: Normal appearance. She exhibits no distension.  Musculoskeletal: Normal range of motion.  Neurological: She is alert and oriented to person, place, and time. No cranial nerve deficit.  Skin: Skin is warm and dry. No rash noted.  Psychiatric: She has a normal mood and affect. Her behavior is normal.    ED Course  Procedures (including critical care time) Sinus rhythm Rate = 68 .  Ventricular premature complexes Choroid progression anteriorly Borderline repolarization abnormality Labs Reviewed  POCT I-STAT, CHEM 8 - Abnormal; Notable for the following:    Glucose, Bld 103 (*)     All other components within normal limits  POCT I-STAT TROPONIN I   No results found.   1. Syncope       MDM          Nelia Shi, MD 11/17/11 2317  Nelia Shi, MD 11/17/11 [phone]

## 2011-11-18 ENCOUNTER — Encounter (HOSPITAL_COMMUNITY): Payer: Self-pay | Admitting: Internal Medicine

## 2011-11-18 DIAGNOSIS — R55 Syncope and collapse: Principal | ICD-10-CM

## 2011-11-18 DIAGNOSIS — I10 Essential (primary) hypertension: Secondary | ICD-10-CM

## 2011-11-18 LAB — COMPREHENSIVE METABOLIC PANEL
Albumin: 3.4 g/dL — ABNORMAL LOW (ref 3.5–5.2)
BUN: 12 mg/dL (ref 6–23)
CO2: 26 mEq/L (ref 19–32)
Chloride: 102 mEq/L (ref 96–112)
Creatinine, Ser: 0.52 mg/dL (ref 0.50–1.10)
GFR calc non Af Amer: 86 mL/min — ABNORMAL LOW (ref >90)
Total Bilirubin: 0.5 mg/dL (ref 0.3–1.2)

## 2011-11-18 LAB — CBC WITH DIFFERENTIAL/PLATELET
Lymphocytes Relative: 29 % (ref 12–46)
Lymphs Abs: 1.7 10*3/uL (ref 0.7–4.0)
Neutro Abs: 3.5 10*3/uL (ref 1.7–7.7)
Neutrophils Relative %: 58 % (ref 43–77)
Platelets: 209 10*3/uL (ref 150–400)
RBC: 4.61 MIL/uL (ref 3.87–5.11)
WBC: 6 10*3/uL (ref 4.0–10.5)

## 2011-11-18 LAB — TSH: TSH: 2.068 u[IU]/mL (ref 0.350–4.500)

## 2011-11-18 LAB — TROPONIN I: Troponin I: <0.3 ng/mL (ref <0.30)

## 2011-11-18 MED ORDER — ONDANSETRON HCL 4 MG/2ML IJ SOLN: 4.0000 mg | Freq: Four times a day (QID) | INTRAMUSCULAR | Status: DC | PRN

## 2011-11-18 MED ORDER — SODIUM CHLORIDE 0.9 % IV SOLN
INTRAVENOUS | Status: DC
  Administered 2011-11-18: 1000 mL via INTRAVENOUS

## 2011-11-18 MED ORDER — ONDANSETRON HCL 4 MG PO TABS: 4.0000 mg | ORAL_TABLET | Freq: Four times a day (QID) | ORAL | Status: DC | PRN

## 2011-11-18 MED ORDER — SODIUM CHLORIDE 0.9 % IJ SOLN
3.0000 mL | Freq: Two times a day (BID) | INTRAMUSCULAR | Status: DC
  Administered 2011-11-18: 3 mL via INTRAVENOUS

## 2011-11-18 MED ORDER — ACETAMINOPHEN 650 MG RE SUPP: 650.0000 mg | Freq: Four times a day (QID) | RECTAL | Status: DC | PRN

## 2011-11-18 MED ORDER — HYDRALAZINE HCL 20 MG/ML IJ SOLN
10.0000 mg | INTRAMUSCULAR | Status: DC | PRN
  Administered 2011-11-18: 10 mg via INTRAVENOUS
  Filled 2011-11-18: qty 0.5

## 2011-11-18 MED ORDER — METOPROLOL TARTRATE 25 MG PO TABS: 25.0000 mg | ORAL_TABLET | Freq: Two times a day (BID) | ORAL | Status: DC

## 2011-11-18 MED ORDER — ACETAMINOPHEN 325 MG PO TABS: 650.0000 mg | ORAL_TABLET | Freq: Four times a day (QID) | ORAL | Status: DC | PRN

## 2011-11-18 MED ORDER — METOPROLOL TARTRATE 25 MG PO TABS
25.0000 mg | ORAL_TABLET | Freq: Two times a day (BID) | ORAL | Status: DC
  Filled 2011-11-18 (×2): qty 1

## 2011-11-18 MED ORDER — LOSARTAN POTASSIUM 50 MG PO TABS
50.0000 mg | ORAL_TABLET | Freq: Every day | ORAL | Status: DC
  Filled 2011-11-18: qty 1

## 2011-11-18 NOTE — Progress Notes (Signed)
Utilization review complete 

## 2011-11-18 NOTE — Discharge Summary (Signed)
Physician Discharge Summary  Patient ID: NOVALEE HORSFALL MRN: [id number] DOB/AGE: April 28, 1925 78 y.o.  Admit date: 11/17/2011 Discharge date: 11/18/2011  Primary Care Physician:  Kimber Relic, MD  Discharge Diagnoses:    .Syncope ?cardiac/recurrent  - NSVT on tele .HTN (hypertension)  Consults:  [facility]  Discharge Medications:   Medication List     As of 11/18/2011  9:47 AM    TAKE these medications         losartan 50 MG tablet   Commonly known as: COZAAR   Take 50 mg by mouth daily.      metoprolol tartrate 25 MG tablet   Commonly known as: LOPRESSOR   Take 1 tablet (25 mg total) by mouth 2 (two) times daily.      VITAMIN E PO   Take 1 tablet by mouth daily.         Brief H and P: For complete details please refer to admission H and P, but in brief 78 year-old female with history of hypertension and strong family history of cardiac arrhythmias with patient's brother having died at age 50 and niece dying at age 29 from cardiac arrhythmia presented to the ER because patient had an episode of loss of consciousness. Patient was at church last evening around 7 PM patient suddenly lost consciousness while she was sitting. She had no prodromal symptoms. She had sweating after she has lost consciousness. Patient was admitted with similar symptoms last July in 2012 and at that time 2-D echo showed EF of 60-65% and CT head was negative   Hospital Course:    *Syncope: recurrent ?due to cardiac arrhythmia. Patient was admitted under observation and cardiology was consulted. Patient had similar event a year ago when she had runs of NSVT. Troponin x3 were negative, EKG per admitting M.D.was unremarkable. Metoprolol 25 mgBID was added per cardiology recommendations. Patient did not want to stay in-patient for any other workup. Patient was cleared for discharge by cardiology, Dr Antoine Poche. She will need event monitor if she has another recurring syncopal episode.    HTN  (hypertension): Cont betablocker and losartan   Day of Discharge BP 161/79  Pulse 74  Temp 98 F (36.7 C) (Oral)  Resp 20  Ht 5\' 6"  (1.676 m)  Wt 89.6 kg (197 lb 8.5 oz)  BMI 31.88 kg/m2  SpO2 94%  Physical Exam: General: Alert and awake oriented x3 not in any acute distress. HEENT: anicteric sclera, pupils reactive to light and accommodation CVS: S1-S2 clear no murmur rubs or gallops Chest: clear to auscultation bilaterally, no wheezing rales or rhonchi Abdomen: soft nontender, nondistended, normal bowel sounds, no organomegaly Extremities: no cyanosis, clubbing or edema noted bilaterally Neuro: Cranial nerves II-XII intact, no focal neurological deficits   The results of significant diagnostics from this hospitalization (including imaging, microbiology, ancillary and laboratory) are listed below for reference.    LAB RESULTS: Basic Metabolic Panel:  Lab 11/17/11 4098  NA 140  K 4.2  CL 101  CO2 --  GLUCOSE 103*  BUN 17  CREATININE 0.90  CALCIUM --  MG --  PHOS --   CBC:  Lab 11/17/11 2207  WBC --  NEUTROABS --  HGB 14.6  HCT 43.0  MCV --  PLT --   Cardiac Enzymes:  Lab 11/18/11 0258  CKTOTAL --  CKMB --  CKMBINDEX --  TROPONINI <0.30    Significant Diagnostic Studies:  No results found.   Disposition and Follow-up:     Discharge  Orders    Future Orders Please Complete By Expires   Diet - low sodium heart healthy      Increase activity slowly      Discharge instructions      Comments:   Please check your BP everyday, if you have BP<110 consistently, then stop cozaar.       DISPOSITION: home  DIET: heart healthy diet ACTIVITY: as tolerated   DISCHARGE FOLLOW-UP Follow-up Information    Follow up with GREEN, Lenon Curt, MD. Schedule an appointment as soon as possible for a visit in 10 days. (for hospital follow-up)    Contact information:   [address] Jeanella Anton [place] [address] [phone]       Follow up with Rollene Rotunda,  MD. Schedule an appointment as soon as possible for a visit in 2 weeks.   Contact information:   [address] Jaclyn Prime [place] [address] [phone]          Time spent on Discharge: 35 mins  Signed:   RAI,RIPUDEEP M.D. [facility] 11/18/2011, 9:47 AM Pager: [phone]

## 2011-11-18 NOTE — H&P (Signed)
Julie Haas is an 78 y.o. female.   Patient was seen and examined on November 18, 2011. PCP - Dr. Murray Hodgkins. Chief Complaint: Loss of consciousness. HPI: 78 year-old female with history of hypertension and strong family history of cardiac arrhythmias with patient's brother having died at age 110 and niece dying at age 31 from cardiac arrhythmia presented to the ER because patient had an episode of loss of consciousness. Patient was at church last evening around 7 PM patient suddenly lost consciousness while she was sitting. She had no prodromal symptoms. She had sweating after she has lost consciousness. Denies any chest pain focal deficits shortness of breath nausea vomiting abdominal pain visual symptoms prior or after the episode. In the ER initial labs EKG where unremarkable. Patient was nonfocal. Patient will be admitted for further management. Patient was admitted with similar symptoms last July in 2012 and at that time 2-D echo showed EF of 60-65% and CT head was negative.  Past Medical History  Diagnosis Date  . Hyperlipidemia   . Arrhythmia     PVC  . Retinal detachment   . Renal disorder     Past Surgical History  Procedure Date  . Cholecystectomy   . Cataract extraction   . Tonsillectomy   . Keratosis     Family History  Problem Relation Age of Onset  . Arrhythmia      Long QT in first degree relatives,  . Heart attack Father 69  . Heart disease Father   . Heart attack Brother 31  . Heart disease Brother    Social History:  reports that she has never smoked. She does not have any smokeless tobacco history on file. She reports that she does not drink alcohol or use illicit drugs.  Allergies:  Allergies  Allergen Reactions  . Codeine     Unknown  . Diuretic (Buchu-Cornsil[place]-Ch Grass-Hydran)     Unknown  . Lipitor (Atorvastatin Calcium)     Unknown  . Penicillins Other (See Comments)    unknown  . Pravachol     Unknown    Medications Prior to Admission   Medication Sig Dispense Refill  . losartan (COZAAR) 50 MG tablet Take 50 mg by mouth daily.      Marland Kitchen VITAMIN E PO Take 1 tablet by mouth daily.        Results for orders placed during the hospital encounter of 11/17/11 (from the past 48 hour(s))  POCT I-STAT TROPONIN I     Status: Normal   Collection Time   11/17/11 10:05 PM      Component Value Range Comment   Troponin i, poc 0.01  0.00 - 0.08 ng/mL    Comment 3            POCT I-STAT, CHEM 8     Status: Abnormal   Collection Time   11/17/11 10:07 PM      Component Value Range Comment   Sodium 140  135 - 145 mEq/L    Potassium 4.2  3.5 - 5.1 mEq/L    Chloride 101  96 - 112 mEq/L    BUN 17  6 - 23 mg/dL    Creatinine, Ser 1.61  0.50 - 1.10 mg/dL    Glucose, Bld 096 (*) 70 - 99 mg/dL    Calcium, Ion 0.45  4.09 - 1.30 mmol/L    TCO2 29  0 - 100 mmol/L    Hemoglobin 14.6  12.0 - 15.0 g/dL    HCT  43.0  36.0 - 46.0 %    No results found.  Review of Systems  Constitutional: Negative.   HENT: Negative.   Eyes: Negative.   Respiratory: Negative.   Cardiovascular: Negative.   Gastrointestinal: Negative.   Genitourinary: Negative.   Musculoskeletal: Negative.   Skin: Negative.   Neurological: Positive for loss of consciousness.  Endo/Heme/Allergies: Negative.   Psychiatric/Behavioral: Negative.     Blood pressure 150/90, pulse 74, temperature 98.1 F (36.7 C), temperature source Oral, resp. rate 20, SpO2 94.00%. Physical Exam  Constitutional: She is oriented to person, place, and time. She appears well-developed and well-nourished. No distress.  HENT:  Head: Normocephalic and atraumatic.  Right Ear: External ear normal.  Left Ear: External ear normal.  Nose: Nose normal.  Mouth/Throat: Oropharynx is clear and moist. No oropharyngeal exudate.  Eyes: Conjunctivae normal are normal. Pupils are equal, round, and reactive to light. Right eye exhibits no discharge. Left eye exhibits no discharge. No scleral icterus.  Neck: Normal  range of motion. Neck supple.  Cardiovascular: Normal rate and regular rhythm.   Respiratory: Effort normal and breath sounds normal. No respiratory distress. She has no wheezes. She has no rales.  GI: Soft. Bowel sounds are normal. She exhibits no distension. There is no tenderness. There is no rebound.  Musculoskeletal: Normal range of motion. She exhibits no edema and no tenderness.  Neurological: She is alert and oriented to person, place, and time.       Moves all extremities 5/5. No facial asymmetry. Tongue is midline.  Skin: Skin is warm and dry. She is not diaphoretic.     Assessment/Plan #1. Syncope - at this time suspecting cardiac arrhythmia as the most likely cause for patient's syncope given her strong family history. [place] cardiology has been consulted and we'll follow their recommendations. Patient will be monitored in telemetry. Check magnesium levels and closely follow electrolytes. #2. Hypertension - continue home medications. Place patient on when necessary hydralazine for systolic blood pressure more than 160.  CODE STATUS - full code.  Tarae Wooden N. 11/18/2011, 1:12 AM

## 2011-11-18 NOTE — Evaluation (Signed)
Physical Therapy Evaluation Patient Details Name: Julie Haas MRN: [id number] DOB: 01/22/1926 Today's Date: 11/18/2011 Time: 1001-1011 PT Time Calculation (min): 10 min  PT Assessment / Plan / Recommendation Clinical Impression  Pt admitted after syncopal episode sitting at [facility]. Pt currently able to ambulate in halls and perform basic transfer without difficulty and reports gait deficit as baseline and for long distances like dining hall she takes RW and encouraged to continue RW use of safety and fall prevention. Pt with NSVT with rate 116 with gait pt asymptomatic and RN aware. Pt denies any further needs and no dizziness or presyncope.     PT Assessment  Patent does not need any further PT services    Follow Up Recommendations  No PT follow up    Barriers to Discharge        Equipment Recommendations  None recommended by PT    Recommendations for Other Services     Frequency      Precautions / Restrictions Precautions Precautions: Fall   Pertinent Vitals/Pain No pain      Mobility  Bed Mobility Bed Mobility: Not assessed Transfers Transfers: Sit to Stand;Stand to Sit Sit to Stand: 6: Modified independent (Device/Increase time);From bed Stand to Sit: 6: Modified independent (Device/Increase time);To bed Ambulation/Gait Ambulation/Gait Assistance: 6: Modified independent (Device/Increase time) Ambulation Distance (Feet): 350 Feet Assistive device: None Ambulation/Gait Assistance Details: Pt with maintained hip flexion with gait and slightly unsteady gait but no LOB and able to perform head turns, change of direction Gait Pattern: Step-through pattern;Decreased stride length;Trunk flexed Gait velocity: WFL Stairs: No    Shoulder Instructions     Exercises     PT Diagnosis:    PT Problem List:   PT Treatment Interventions:     PT Goals    Visit Information  Last PT Received On: 11/18/11 Assistance Needed: +1    Subjective Data  Subjective:  I'm moving like I always do Patient Stated Goal: to go home today   Prior Functioning  Home Living Lives With: Alone Available Help at Discharge: Friend(s);Available PRN/intermittently Type of Home: Apartment (retirement center) Home Access: Level entry Home Layout: One level Bathroom Shower/Tub: Health visitor: Standard Home Adaptive Equipment: Walker - rolling;Other (comment) (scooter) Prior Function Level of Independence: Needs assistance Needs Assistance: Meal Prep;Light Housekeeping Meal Prep: Maximal Light Housekeeping: Minimal Able to Take Stairs?: No Driving: Yes Vocation: Retired Comments: Pt performs all ADLs other than meals and walks to dining hall for those. With housekeeper 1x/month. Pt states she doesn't use RW inside but takes it to walk to dining hall Communication Communication: No difficulties Dominant Hand: Right    Cognition  Overall Cognitive Status: Appears within functional limits for tasks assessed/performed Arousal/Alertness: Awake/alert Orientation Level: Appears intact for tasks assessed Behavior During Session: [facility] for tasks performed    Extremity/Trunk Assessment Right Upper Extremity Assessment RUE ROM/Strength/Tone: [facility] for tasks assessed Left Upper Extremity Assessment LUE ROM/Strength/Tone: [facility] for tasks assessed Right Lower Extremity Assessment RLE ROM/Strength/Tone: [facility] for tasks assessed Left Lower Extremity Assessment LLE ROM/Strength/Tone: [facility] for tasks assessed Trunk Assessment Trunk Assessment: Normal   Balance    End of Session PT - End of Session Activity Tolerance: Patient tolerated treatment well Patient left: in bed;with call bell/phone within reach;with family/visitor present Nurse Communication: Mobility status  GP     Toney Sang Beth 11/18/2011, 10:19 AM  Delaney Meigs, PT [phone]

## 2011-11-18 NOTE — Consult Note (Addendum)
CARDIOLOGY CONSULT NOTE  Patient ID: Julie Haas, MRN: [id number], DOB/AGE: Jul 31, 1925 78 y.o. Admit date: 11/17/2011 Date of Consult: 11/18/2011  Primary Physician: Kimber Relic, MD Primary Cardiologist: Dr. Rollene Rotunda  Chief Complaint: syncope Reason for Consultation: cardiac cause of syncope?  HPI: 78 y.o. female w/ PMHx significant for syncope in the past, hyperlipidemia who presented to [facility] on 11/17/2011 with complaints of fainting at her living facility. She reports being at her baseline health prior to the event. Went to prayers and during the ceremony, she lost consciousness while seated in the chair. She denies any impending sensation prior to event. No chest pain, no SOB. No loss of bowels or urine. No injury- remained in the chair. When she awoke, she was mentating normally. Felt flushed and hot but this resolved.  She also admits to occasional palpitations but over the last several months, she has not noticed these as much as before.  She was evaluated 1 year ago by [facility] cardiology for a similar event. Workup at the time included echo and head ct which was unrevealing. During the hospitalization, she was having runs of NSVT of 6-7 beats. Also interestingly, she had a brother die at age 92 of a cardiac event (?arrhythmia) as well as a cousin at age 80.  Chart indications another pre-syncopal event that occurred while she was at her PCP in June but she does not recall any of the details of that event.  A stress test and event monitor were recommended to her but she preferred not to proceed with further evaluation.   Past Medical History  Diagnosis Date  . Hyperlipidemia   . Arrhythmia     PVC  . Retinal detachment   . Renal disorder     HTN  Surgical History:  Past Surgical History  Procedure Date  . Cholecystectomy   . Cataract extraction   . Tonsillectomy   . Keratosis      Home Meds: Prior to Admission medications   Medication Sig  Start Date End Date Taking? Authorizing Provider  losartan (COZAAR) 50 MG tablet Take 50 mg by mouth daily.   Yes Historical Provider, MD  VITAMIN E PO Take 1 tablet by mouth daily.   Yes Historical Provider, MD    Inpatient Medications:    . losartan  50 mg Oral Daily  . sodium chloride  3 mL Intravenous Q12H      . sodium chloride      Allergies:  Allergies  Allergen Reactions  . Codeine     Unknown  . Diuretic (Buchu-Cornsilk-Ch Grass-Hydran)     Unknown  . Lipitor (Atorvastatin Calcium)     Unknown  . Penicillins Other (See Comments)    unknown  . Pravachol     Unknown    History   Social History  . Marital Status: Single    Spouse Name: N/A    Number of Children: N/A  . Years of Education: N/A   Occupational History  . Not on file.   Social History Main Topics  . Smoking status: Never Smoker   . Smokeless tobacco: Not on file  . Alcohol Use: No  . Drug Use: No  . Sexually Active:    Other Topics Concern  . Not on file   Social History Narrative   The patient is married.  She has two children and two  grandchildren.  She never smoked cigarettes and does not drink alcohol.  Family History  Problem Relation Age of Onset  . Arrhythmia      Long QT in first degree relatives,  . Heart attack Father 62  . Heart disease Father   . Heart attack Brother 31  . Heart disease Brother      Review of Systems: General: negative for chills, fever, night sweats or weight changes.  Cardiovascular: as per HPI. Dermatological: negative for rash Respiratory: negative for cough or wheezing Urologic: negative for hematuria Abdominal: negative for nausea, vomiting, diarrhea, bright red blood per rectum, melena, or hematemesis Neurologic: negative for visual changes,  or dizziness All other systems reviewed and are otherwise negative except as noted above.  Labs: No results found for this basename: CKTOTAL:4,CKMB:4,TROPONINI:4 in the last 72 hours Lab  Results  Component Value Date   WBC 4.7 07/20/2011   HGB 14.6 11/17/2011   HCT 43.0 11/17/2011   MCV 93.8 07/20/2011   PLT 248 09/09/2010    Lab 11/17/11 2207  NA 140  K 4.2  CL 101  CO2 --  BUN 17  CREATININE 0.90  CALCIUM --  PROT --  BILITOT --  ALKPHOS --  ALT --  AST --  GLUCOSE 103*   No results found for this basename: CHOL, HDL, LDLCALC, TRIG   No results found for this basename: DDIMER    Radiology/Studies:  No results found.  EKG:  None done.  Telemetry: occ PVCs 2 runs of 6 beats of wide complex tach c/w NSVT @150  BPM  Physical Exam: Blood pressure 150/90, pulse 74, temperature 98.1 F (36.7 C), temperature source Oral, resp. rate 20, height 5\' 6"  (1.676 m), weight 89.6 kg (197 lb 8.5 oz), SpO2 94.00%. General: Well developed, well nourished, in no acute distress. Head: Normocephalic, atraumatic, sclera non-icteric, no xanthomas, nares are without discharge.  Neck: Supple. Negative for carotid bruits. JVD not elevated. Lungs: Clear bilaterally to auscultation without wheezes, rales, or rhonchi. Breathing is unlabored. Heart: RRR with S1 S2. No murmurs, rubs, or gallops appreciated. Abdomen: Soft, non-tender, non-distended with normoactive bowel sounds. No hepatomegaly. No rebound/guarding. No obvious abdominal masses. Msk:  Strength and tone appear normal for age. Extremities: No clubbing or cyanosis. No edema.  Distal pedal pulses are 2+ and equal bilaterally. Neuro: Alert and oriented X 3. Moves all extremities spontaneously. Psych:  Responds to questions appropriately with a normal affect.   Problem List 1. Syncope, recurrent 2. HTN 3. Presumed NSVT on telemetry, asymptomatic  Assessment and Plan:  78 y.o. female w/ PMHx significant for syncope in the past, hyperlipidemia who presented to [facility] on 11/17/2011 with complaints of a syncopal event at her living facility. Similar to episode 1 year ago.  Prior workup has included an echo which  demonstrated normal EF. Planned workup included an ischemia evaluation and event monitor but she declined. I repeated the offer of the ischemia evaluation and she again declined arguing that she would prefer not to go down a route of testing begetting testing (her late husband apparently had a bad experience with this).  Currently, she continues to want minimal workup and evaluation. A beta blocker may be reasonable to suppress the NSVT and would be beneficial if ischemia is playing a role. In keeping with her wishes, the brief recommendations are outlined below.  Recs: 1. 12 lead EKG (ordered) 2. Consider addition (or substitution of ARB) of metoprolol 25 bid for PVC and NSVT suppression. 3. Check lytes (Mg, K) in the am.  Thank you for this  interesting consult. [facility] will followup in the AM.  Signed, WHITLOCK, MATTHEW C. MD 11/18/2011, 2:11 AM   History and all data above reviewed. . I agree with the findings as above.   All available labs, radiology testing, previous records reviewed. Agree with documented assessment and plan. I spoke with the patient and she does not want further work up.  She does however agree to let us know if she has further events at which point she would consider wearing an event monitor.  Rollene Rotunda  8:52 AM  11/18/2011

## 2011-11-19 NOTE — Progress Notes (Signed)
11/18/11 1019  PT G-Codes **NOT FOR INPATIENT CLASS**  Functional Assessment Tool Used clinical judgement  Functional Limitation Mobility: Walking and moving around  Mobility: Walking and Moving Around Current Status [phone]) CH  Mobility: Walking and Moving Around Goal Status [phone]) CH  Mobility: Walking and Moving Around Discharge Status (W2956) [facility]  PT General Charges  $$ ACUTE PT VISIT 1 Procedure  PT Evaluation  $Initial PT Evaluation Tier I 1 Procedure   addendum to 9/30 note given observation status Delaney Meigs, PT [phone]

## 2011-12-03 ENCOUNTER — Encounter: Payer: Self-pay | Admitting: Cardiology

## 2011-12-03 ENCOUNTER — Ambulatory Visit (INDEPENDENT_AMBULATORY_CARE_PROVIDER_SITE_OTHER): Payer: Medicare Other | Admitting: Cardiology

## 2011-12-03 VITALS — BP 151/72 | HR 60 | Ht 63.0 in | Wt 195.1 lb

## 2011-12-03 DIAGNOSIS — I10 Essential (primary) hypertension: Secondary | ICD-10-CM

## 2011-12-03 DIAGNOSIS — R55 Syncope and collapse: Secondary | ICD-10-CM

## 2011-12-03 MED ORDER — LOSARTAN POTASSIUM 50 MG PO TABS: 50.0000 mg | ORAL_TABLET | Freq: Two times a day (BID) | ORAL | Status: DC

## 2011-12-03 NOTE — Patient Instructions (Addendum)
Please increase your Losartan to 50 mg twice a day Continue all other medications as listed  Follow up in 2 months with Dr Antoine Poche

## 2011-12-03 NOTE — Progress Notes (Signed)
HPI The patient presents for follow up after a syncopal episode.  I saw her in the hospital.  She had a syncopal episode. This was while seated in a chair prayer service. She had no prodrome. There was no loss of bowel or bladder. She was briefly hospitalized. She had short runs of nonsustained V. tach 6-7 beats. We did suggest possible event monitor and stress test. However, the patient did not want this. Since going home she's had no recurrent syncope. She's had no palpitations or presyncope. She's active and denies any chest pressure, neck or arm discomfort. She has had no shortness of breath, PND or orthopnea. She does keep a blood pressure diary her blood pressures have been high. Of note we did put her on low dose of beta blocker during her hospitalization she tolerated this.   Allergies  Allergen Reactions  . Codeine     Unknown  . Diuretic (Buchu-Cornsilk-Ch Grass-Hydran)     Unknown  . Lipitor (Atorvastatin Calcium)     Unknown  . Penicillins Other (See Comments)    unknown  . Pravachol     Unknown    Current Outpatient Prescriptions  Medication Sig Dispense Refill  . losartan (COZAAR) 50 MG tablet Take 50 mg by mouth daily.      . metoprolol tartrate (LOPRESSOR) 25 MG tablet Take 1 tablet (25 mg total) by mouth 2 (two) times daily.  60 tablet  3  . VITAMIN E PO Take 1 tablet by mouth daily.        Past Medical History  Diagnosis Date  . Hyperlipidemia   . Arrhythmia     PVC  . Retinal detachment   . Renal disorder     Past Surgical History  Procedure Date  . Cholecystectomy   . Cataract extraction   . Tonsillectomy   . Keratosis     ROS:  As stated in the HPI and negative for all other systems.  PHYSICAL EXAM BP 151/72  Pulse 60  Ht 5\' 3"  (1.6 m)  Wt 195 lb 1.9 oz (88.506 kg)  BMI 34.56 kg/m2 GENERAL:  Well appearing HEENT:  Pupils equal round and reactive, fundi not visualized, oral mucosa unremarkable NECK:  No jugular venous distention, waveform  within normal limits, carotid upstroke brisk and symmetric, no bruits, no thyromegaly LUNGS:  Clear to auscultation bilaterally LYMPHATICS:  Unremarkable BACK:  No CVA tenderness CHEST:  Unremarkable HEART:  PMI not displaced or sustained,S1 and S2 within normal limits, no S3, no S4, no clicks, no rubs, early peaking apical systolic murmur radiating out the aortic outflow tract, no diastolic murmurs ABD:  Flat, positive bowel sounds normal in frequency in pitch, no bruits, no rebound, no guarding, no midline pulsatile mass, no hepatomegaly, no splenomegaly EXT:  2 plus pulses throughout, no edema, no cyanosis no clubbing NEURO:  Cranial nerves II through XII grossly intact, motor grossly intact  ASSESSMENT AND PLAN  Syncope -  The patient has had no further cardiac symptoms or syncopal events.  If this occurs again she would consent to wearing a 21 day event monitor.    HTN (hypertension) -  Her blood pressure is elevated today. I reviewed her BP diary.  It is consistently elevated.  I will increase the Cozaar to 50 mg bid.    NSVT - She has had no symptomatic palpitations.  She will continue the beta blocker.   MURMUR - She has mild aortic sclerosis and I reviewed the echo done last year.  No further workup is suggested.

## 2012-01-07 ENCOUNTER — Ambulatory Visit (INDEPENDENT_AMBULATORY_CARE_PROVIDER_SITE_OTHER): Payer: Medicare Other | Admitting: Internal Medicine

## 2012-01-07 ENCOUNTER — Ambulatory Visit: Payer: Medicare Other

## 2012-01-07 VITALS — BP 122/58 | HR 73 | Temp 98.7°F | Resp 20 | Ht 64.5 in | Wt 196.0 lb

## 2012-01-07 DIAGNOSIS — R0600 Dyspnea, unspecified: Secondary | ICD-10-CM

## 2012-01-07 DIAGNOSIS — R509 Fever, unspecified: Secondary | ICD-10-CM

## 2012-01-07 DIAGNOSIS — R05 Cough: Secondary | ICD-10-CM

## 2012-01-07 DIAGNOSIS — J189 Pneumonia, unspecified organism: Secondary | ICD-10-CM

## 2012-01-07 DIAGNOSIS — R0609 Other forms of dyspnea: Secondary | ICD-10-CM

## 2012-01-07 LAB — POCT CBC
Granulocyte percent: 75.9 %G (ref 37–80)
MID (cbc): 1.4 — AB (ref 0–0.9)
MPV: 9.7 fL (ref 0–99.8)
POC Granulocyte: 9.8 — AB (ref 2–6.9)
POC LYMPH PERCENT: 13.3 %L (ref 10–50)
Platelet Count, POC: 327 10*3/uL (ref 142–424)
RBC: 4.57 M/uL (ref 4.04–5.48)
RDW, POC: 13.2 %

## 2012-01-07 MED ORDER — HYDROCODONE-ACETAMINOPHEN 7.5-325 MG/15ML PO SOLN: 15.0000 mL | Freq: Four times a day (QID) | ORAL | Status: DC | PRN

## 2012-01-07 MED ORDER — LEVOFLOXACIN 500 MG PO TABS: 500.0000 mg | ORAL_TABLET | Freq: Every day | ORAL | Status: DC

## 2012-01-07 NOTE — Progress Notes (Signed)
  Subjective:    Patient ID: Julie Haas, female    DOB: March 04, 1925, 78 y.o.   MRN: [id number]  HPI Cough for 3 weeks, low grade fever recently, fatigue. No sob, cp, hemoptysis. Past hx of pneumonia many times, has had the vaccines   Review of Systems     Objective:   Physical Exam  Vitals reviewed. Constitutional: She is oriented to person, place, and time. She appears well-developed and well-nourished. No distress.  HENT:  Right Ear: External ear normal.  Left Ear: External ear normal.  Nose: Nose normal.  Mouth/Throat: Oropharynx is clear and moist.  Eyes: EOM are normal. No scleral icterus.  Cardiovascular: Normal rate, regular rhythm and normal heart sounds.   Pulmonary/Chest: Effort normal. She has wheezes. She has rales.  Neurological: She is alert and oriented to person, place, and time. She has normal reflexes. Coordination normal.  Psychiatric: She has a normal mood and affect. Her behavior is normal.     Results for orders placed in visit on 01/07/12  POCT CBC      Component Value Range   WBC 12.9 (*) 4.6 - 10.2 K/uL   Lymph, poc 1.7  0.6 - 3.4   POC LYMPH PERCENT 13.3  10 - 50 %L   MID (cbc) 1.4 (*) 0 - 0.9   POC MID % 10.8  0 - 12 %M   POC Granulocyte 9.8 (*) 2 - 6.9   Granulocyte percent 75.9  37 - 80 %G   RBC 4.57  4.04 - 5.48 M/uL   Hemoglobin 13.9  12.2 - 16.2 g/dL   HCT, POC 40.9  81.1 - 47.9 %   MCV 98.7 (*) 80 - 97 fL   MCH, POC 30.4  27 - 31.2 pg   MCHC 30.8 (*) 31.8 - 35.4 g/dL   RDW, POC 91.4     Platelet Count, POC 327  142 - 424 K/uL   MPV 9.7  0 - 99.8 fL   [facility] reading (PRIMARY) by  Dr.Phuong Hillary infiltrate right middle lobe      Assessment & Plan:  Pneumonia Levaquin/lortab Reck 3days

## 2012-01-07 NOTE — Patient Instructions (Signed)

## 2012-01-10 ENCOUNTER — Ambulatory Visit: Payer: Medicare Other

## 2012-01-10 ENCOUNTER — Ambulatory Visit (INDEPENDENT_AMBULATORY_CARE_PROVIDER_SITE_OTHER): Payer: Medicare Other | Admitting: Internal Medicine

## 2012-01-10 VITALS — BP 110/76 | HR 68 | Temp 98.5°F | Resp 20 | Ht 64.5 in | Wt 196.0 lb

## 2012-01-10 DIAGNOSIS — J189 Pneumonia, unspecified organism: Secondary | ICD-10-CM

## 2012-01-10 DIAGNOSIS — R9389 Abnormal findings on diagnostic imaging of other specified body structures: Secondary | ICD-10-CM

## 2012-01-10 DIAGNOSIS — R918 Other nonspecific abnormal finding of lung field: Secondary | ICD-10-CM

## 2012-01-10 LAB — POCT CBC
HCT, POC: 41.8 % (ref 37.7–47.9)
Hemoglobin: 13.3 g/dL (ref 12.2–16.2)
Lymph, poc: 1.9 (ref 0.6–3.4)
MCH, POC: 31 pg (ref 27–31.2)
MCHC: 31.8 g/dL (ref 31.8–35.4)
MCV: 97.4 fL — AB (ref 80–97)
POC Granulocyte: 8.1 — AB (ref 2–6.9)
POC LYMPH PERCENT: 17.3 %L (ref 10–50)
WBC: 10.8 10*3/uL — AB (ref 4.6–10.2)

## 2012-01-10 NOTE — Progress Notes (Signed)
  Subjective:    Patient ID: Julie Haas, female    DOB: November 29, 1925, 78 y.o.   MRN: [id number]  HPI Pneumonia is better, levaquin working well with no side affects. No sob, cough is loose and productive. No hemoptysis CXR read as rml infiltrate with effusion. On 11/19  Review of Systems     Objective:   Physical Exam  Vitals reviewed. Constitutional: She is oriented to person, place, and time. She appears well-developed and well-nourished.  HENT:  Nose: Nose normal.  Cardiovascular: Normal rate, regular rhythm and normal heart sounds.   Pulmonary/Chest: Not tachypneic. No respiratory distress. She has rhonchi in the right middle field and the right lower field. She has rales.  Neurological: She is alert and oriented to person, place, and time. Coordination normal.  Skin: Skin is warm and dry.  Psychiatric: She has a normal mood and affect.   [facility] reading (PRIMARY) by  Dr.Tiney Zipper infiltrate improved a little, effusion persists.She feels better Results for orders placed in visit on 01/07/12  POCT CBC      Component Value Range   WBC 12.9 (*) 4.6 - 10.2 K/uL   Lymph, poc 1.7  0.6 - 3.4   POC LYMPH PERCENT 13.3  10 - 50 %L   MID (cbc) 1.4 (*) 0 - 0.9   POC MID % 10.8  0 - 12 %M   POC Granulocyte 9.8 (*) 2 - 6.9   Granulocyte percent 75.9  37 - 80 %G   RBC 4.57  4.04 - 5.48 M/uL   Hemoglobin 13.9  12.2 - 16.2 g/dL   HCT, POC 30.8  65.7 - 47.9 %   MCV 98.7 (*) 80 - 97 fL   MCH, POC 30.4  27 - 31.2 pg   MCHC 30.8 (*) 31.8 - 35.4 g/dL   RDW, POC 84.6     Platelet Count, POC 327  142 - 424 K/uL   MPV 9.7  0 - 99.8 fL   . Results for orders placed in visit on 01/10/12  POCT CBC      Component Value Range   WBC 10.8 (*) 4.6 - 10.2 K/uL   Lymph, poc 1.9  0.6 - 3.4   POC LYMPH PERCENT 17.3  10 - 50 %L   MID (cbc) 0.9  0 - 0.9   POC MID % 8.1  0 - 12 %M   POC Granulocyte 8.1 (*) 2 - 6.9   Granulocyte percent 74.6  37 - 80 %G   RBC 4.29  4.04 - 5.48 M/uL   Hemoglobin 13.3   12.2 - 16.2 g/dL   HCT, POC 96.2  95.2 - 47.9 %   MCV 97.4 (*) 80 - 97 fL   MCH, POC 31.0  27 - 31.2 pg   MCHC 31.8  31.8 - 35.4 g/dL   RDW, POC 84.1     Platelet Count, POC 436 (*) 142 - 424 K/uL   MPV 8.5  0 - 99.8 fL              Assessment & Plan:  Pneumonia improving/ Will need ct chest if does not clear Continue levaquin 14 days RECK 11/26

## 2012-01-14 ENCOUNTER — Ambulatory Visit (INDEPENDENT_AMBULATORY_CARE_PROVIDER_SITE_OTHER): Payer: Medicare Other | Admitting: Internal Medicine

## 2012-01-14 VITALS — BP 155/81 | HR 72 | Temp 98.7°F | Resp 20 | Ht 64.0 in | Wt 195.0 lb

## 2012-01-14 DIAGNOSIS — R9389 Abnormal findings on diagnostic imaging of other specified body structures: Secondary | ICD-10-CM

## 2012-01-14 DIAGNOSIS — R918 Other nonspecific abnormal finding of lung field: Secondary | ICD-10-CM

## 2012-01-14 DIAGNOSIS — R0902 Hypoxemia: Secondary | ICD-10-CM

## 2012-01-14 DIAGNOSIS — J189 Pneumonia, unspecified organism: Secondary | ICD-10-CM

## 2012-01-14 DIAGNOSIS — J90 Pleural effusion, not elsewhere classified: Secondary | ICD-10-CM

## 2012-01-14 NOTE — Progress Notes (Signed)
  Subjective:    Patient ID: Julie Haas, female    DOB: Oct 13, 1925, 78 y.o.   MRN: [id number]  HPI Much improved clinically. Started 2nd week of levaquin. Second cxr shows infiltrate, effusion, and possible lesion rulobe. No hx of TB, never had a positive skin ppd, no hemoptysis, and cough has gone away. No nite sweats,wt loss.   Review of Systems     Objective:   Physical Exam Lungs unchanged Oximetry 90%, she is not tachypneic, and has no new doe Heart is nl       Assessment & Plan:  Resolving pneumonia Dr. Chilton Si to f/up on abnl cxr. Finish levaquin

## 2012-01-29 ENCOUNTER — Ambulatory Visit (INDEPENDENT_AMBULATORY_CARE_PROVIDER_SITE_OTHER): Payer: Medicare Other | Admitting: Cardiology

## 2012-01-29 ENCOUNTER — Encounter: Payer: Self-pay | Admitting: Cardiology

## 2012-01-29 VITALS — BP 140/70 | HR 70 | Ht 64.0 in | Wt 195.8 lb

## 2012-01-29 DIAGNOSIS — I10 Essential (primary) hypertension: Secondary | ICD-10-CM

## 2012-01-29 DIAGNOSIS — J918 Pleural effusion in other conditions classified elsewhere: Secondary | ICD-10-CM

## 2012-01-29 DIAGNOSIS — R55 Syncope and collapse: Secondary | ICD-10-CM

## 2012-01-29 DIAGNOSIS — J90 Pleural effusion, not elsewhere classified: Secondary | ICD-10-CM

## 2012-01-29 DIAGNOSIS — R0989 Other specified symptoms and signs involving the circulatory and respiratory systems: Secondary | ICD-10-CM

## 2012-01-29 NOTE — Patient Instructions (Addendum)
The current medical regimen is effective;  continue present plan and medications.  Your physician has requested that you have a carotid duplex. This test is an ultrasound of the carotid arteries in your neck. It looks at blood flow through these arteries that supply the brain with blood. Allow one hour for this exam. There are no restrictions or special instructions.  Follow up in 6 months with Dr Hochrein.  You will receive a letter in the mail 2 months before you are due.  Please call us when you receive this letter to schedule your follow up appointment.  

## 2012-01-29 NOTE — Progress Notes (Signed)
   HPI The patient presents for follow up after a syncopal episode earlier this year. Since I last saw her she has had no palpitations. She has had no presyncope or syncope. She is recovering from pneumonia. She hasn't been able to ambulate as much as she had been. However, she's not describing any new symptoms. She has no new shortness of breath, PND or orthopnea. She has no new weight gain or edema.   Allergies  Allergen Reactions  . Codeine     Unknown  . Diuretic (Buchu-Cornsilk-Ch Grass-Hydran)     Unknown  . Lipitor (Atorvastatin Calcium)     Unknown  . Penicillins Other (See Comments)    unknown  . Pravachol     Unknown    Current Outpatient Prescriptions  Medication Sig Dispense Refill  . losartan (COZAAR) 50 MG tablet Take 1 tablet (50 mg total) by mouth 2 (two) times daily.  60 tablet  11  . metoprolol tartrate (LOPRESSOR) 25 MG tablet Take 1 tablet (25 mg total) by mouth 2 (two) times daily.  60 tablet  3  . VITAMIN E PO Take 1 tablet by mouth daily.        Past Medical History  Diagnosis Date  . Hyperlipidemia   . Arrhythmia     PVC  . Retinal detachment   . Renal disorder   . Allergy     Past Surgical History  Procedure Date  . Cholecystectomy   . Cataract extraction   . Tonsillectomy   . Keratosis     ROS:  As stated in the HPI and negative for all other systems.  PHYSICAL EXAM BP 140/70  Pulse 70  Ht 5\' 4"  (1.626 m)  Wt 195 lb 12.8 oz (88.814 kg)  BMI 33.61 kg/m2 GENERAL:  Well appearing HEENT:  Pupils equal round and reactive, fundi not visualized, oral mucosa unremarkable NECK:  No jugular venous distention, waveform within normal limits, carotid upstroke brisk and symmetric, soft transmitted systolic murmur versus left carotid bruits, no thyromegaly LUNGS:  Clear to auscultation bilaterally LYMPHATICS:  Unremarkable BACK:  No CVA tenderness CHEST:  Unremarkable HEART:  PMI not displaced or sustained,S1 and S2 within normal limits, no S3, no  S4, no clicks, no rubs, early peaking apical systolic murmur radiating out the aortic outflow tract, no diastolic murmurs ABD:  Flat, positive bowel sounds normal in frequency in pitch, no bruits, no rebound, no guarding, no midline pulsatile mass, no hepatomegaly, no splenomegaly EXT:  2 plus pulses throughout, no edema, no cyanosis no clubbing NEURO:  Cranial nerves II through XII grossly intact, motor grossly intact  ASSESSMENT AND PLAN  Syncope -  The patient has had no further cardiac symptoms or syncopal events.  If this occurs again she would consent to wearing a 21 day event monitor and we've renewed this is today.    HTN (hypertension) -  I did increase the Cozaar to 50 mg twice daily at the last visit. Her blood pressure diary demonstrates that the evening readings that she takes are typically above 140 sometimes in the 160 range.  NSVT - She has had no symptomatic palpitations.  She will continue the beta blocker.   MURMUR - She has mild aortic sclerosis and I reviewed the echo done last year. No further workup is suggested.   Bruit - I will obtain carotid Dopplers to further evaluate a bruit versus transmitted systolic murmur

## 2012-02-05 ENCOUNTER — Encounter (INDEPENDENT_AMBULATORY_CARE_PROVIDER_SITE_OTHER): Payer: Medicare Other

## 2012-02-05 DIAGNOSIS — I6529 Occlusion and stenosis of unspecified carotid artery: Secondary | ICD-10-CM

## 2012-02-05 DIAGNOSIS — R0989 Other specified symptoms and signs involving the circulatory and respiratory systems: Secondary | ICD-10-CM

## 2012-02-10 ENCOUNTER — Telehealth: Payer: Self-pay | Admitting: Cardiology

## 2012-02-10 DIAGNOSIS — I6523 Occlusion and stenosis of bilateral carotid arteries: Secondary | ICD-10-CM

## 2012-02-10 NOTE — Telephone Encounter (Signed)
Results given/ 6 month recall set and order placed

## 2012-02-10 NOTE — Telephone Encounter (Signed)
New Problem:    Patient returned your call about her carotid results.  Please call back.

## 2012-03-19 ENCOUNTER — Telehealth: Payer: Self-pay | Admitting: Cardiology

## 2012-03-19 ENCOUNTER — Other Ambulatory Visit: Payer: Self-pay

## 2012-03-19 MED ORDER — METOPROLOL TARTRATE 25 MG PO TABS: 25.0000 mg | ORAL_TABLET | Freq: Two times a day (BID) | ORAL | Status: DC

## 2012-03-19 NOTE — Telephone Encounter (Signed)
New Problem:    Patient called in needing a refill of her metoprolol tartrate (LOPRESSOR) 25 MG tablet.

## 2012-03-20 ENCOUNTER — Other Ambulatory Visit: Payer: Self-pay | Admitting: *Deleted

## 2012-03-20 MED ORDER — METOPROLOL TARTRATE 25 MG PO TABS: 25.0000 mg | ORAL_TABLET | Freq: Two times a day (BID) | ORAL | Status: DC

## 2012-03-24 DIAGNOSIS — I6529 Occlusion and stenosis of unspecified carotid artery: Secondary | ICD-10-CM | POA: Insufficient documentation

## 2012-03-24 HISTORY — DX: Occlusion and stenosis of unspecified carotid artery: I65.29

## 2012-07-28 ENCOUNTER — Other Ambulatory Visit: Payer: Self-pay | Admitting: Cardiology

## 2012-07-28 NOTE — Telephone Encounter (Signed)
..   Requested Prescriptions   Pending Prescriptions Disp Refills  . metoprolol tartrate (LOPRESSOR) 25 MG tablet [Pharmacy Med Name: METOPROLOL TARTRATE 25 MG TAB] 60 tablet 1    Sig: TAKE 1 TABLET TWICE DAILY.

## 2012-08-10 ENCOUNTER — Ambulatory Visit: Payer: Medicare Other | Admitting: Cardiology

## 2012-08-11 ENCOUNTER — Encounter: Payer: Medicare Other | Admitting: Internal Medicine

## 2012-09-10 ENCOUNTER — Encounter (INDEPENDENT_AMBULATORY_CARE_PROVIDER_SITE_OTHER): Payer: Medicare Other

## 2012-09-10 ENCOUNTER — Ambulatory Visit (INDEPENDENT_AMBULATORY_CARE_PROVIDER_SITE_OTHER): Payer: Medicare Other | Admitting: Cardiology

## 2012-09-10 ENCOUNTER — Encounter: Payer: Self-pay | Admitting: Cardiology

## 2012-09-10 VITALS — BP 121/69 | HR 58 | Ht 64.0 in | Wt 195.2 lb

## 2012-09-10 DIAGNOSIS — I6523 Occlusion and stenosis of bilateral carotid arteries: Secondary | ICD-10-CM

## 2012-09-10 DIAGNOSIS — I714 Abdominal aortic aneurysm, without rupture, unspecified: Secondary | ICD-10-CM

## 2012-09-10 DIAGNOSIS — I6529 Occlusion and stenosis of unspecified carotid artery: Secondary | ICD-10-CM

## 2012-09-10 DIAGNOSIS — R55 Syncope and collapse: Secondary | ICD-10-CM

## 2012-09-10 DIAGNOSIS — I10 Essential (primary) hypertension: Secondary | ICD-10-CM

## 2012-09-10 NOTE — Patient Instructions (Addendum)
The current medical regimen is effective;  continue present plan and medications.  Follow up in 1 year with Dr Hochrein.  You will receive a letter in the mail 2 months before you are due.  Please call us when you receive this letter to schedule your follow up appointment.  

## 2012-09-10 NOTE — Progress Notes (Signed)
   HPI The patient presents for follow up after a syncopal episode earlier this year. At the last appointment I did increase her Cozaar but her blood pressure came down and the dose has been reduced back to the previous. She's had carotid Dopplers as described below. She's had no further syncopal episodes which she had previously. She denies any chest pressure, neck or arm discomfort. She's not noticing any palpitations. She has no chest pressure, neck or arm discomfort. She has no weight gain or edema. She does try to stay busy.  Allergies  Allergen Reactions  . Codeine     Unknown  . Diuretic (Buchu-Cornsilk-Ch Grass-Hydran)     Unknown  . Lipitor (Atorvastatin Calcium)     Unknown  . Penicillins Other (See Comments)    unknown  . Pravachol     Unknown    Current Outpatient Prescriptions  Medication Sig Dispense Refill  . Cholecalciferol (VITAMIN D PO) Take by mouth daily.      Marland Kitchen losartan (COZAAR) 50 MG tablet Take 50 mg by mouth daily.      . metoprolol tartrate (LOPRESSOR) 25 MG tablet TAKE 1 TABLET TWICE DAILY.  60 tablet  1   No current facility-administered medications for this visit.    Past Medical History  Diagnosis Date  . Hyperlipidemia   . Arrhythmia     PVC  . Retinal detachment   . Renal disorder   . Allergy     Past Surgical History  Procedure Laterality Date  . Cholecystectomy    . Cataract extraction    . Tonsillectomy    . Keratosis      ROS:  As stated in the HPI and negative for all other systems.  PHYSICAL EXAM BP 121/69  Pulse 58  Ht 5\' 4"  (1.626 m)  Wt 195 lb 3.2 oz (88.542 kg)  BMI 33.49 kg/m2 GENERAL:  Well appearing HEENT:  Pupils equal round and reactive, fundi not visualized, oral mucosa unremarkable NECK:  No jugular venous distention, waveform within normal limits, carotid upstroke brisk and symmetric, soft transmitted systolic murmur versus left carotid bruits, no thyromegaly LUNGS:  Clear to auscultation bilaterally LYMPHATICS:   Unremarkable BACK:  No CVA tenderness CHEST:  Unremarkable HEART:  PMI not displaced or sustained,S1 and S2 within normal limits, no S3, no S4, no clicks, no rubs, early peaking apical systolic murmur radiating out the aortic outflow tract, no diastolic murmurs ABD:  Flat, positive bowel sounds normal in frequency in pitch, no bruits, no rebound, no guarding, no midline pulsatile mass, no hepatomegaly, no splenomegaly EXT:  2 plus pulses throughout, no edema, no cyanosis no clubbing NEURO:  Cranial nerves II through XII grossly intact, motor grossly intact  ASSESSMENT AND PLAN  Syncope -  The patient has had no further cardiac symptoms or syncopal events.  We have talked about wearing the monitor but since she's had no further events we're going to forego this.  HTN (hypertension) -  I will defer management to GREEN, Lenon Curt, MD.  Her blood pressure is currently well controlled.  NSVT - She has had no symptomatic palpitations.  She will continue the beta blocker.   MURMUR - She has mild aortic sclerosis and I reviewed the echo done last year. No further workup is suggested.   CAROTID STENOSIS - She has 60-79% bilateral stenosis with a carotid Doppler done today and she will have this repeated in 6 months.

## 2012-09-21 DIAGNOSIS — E785 Hyperlipidemia, unspecified: Secondary | ICD-10-CM | POA: Insufficient documentation

## 2012-09-22 ENCOUNTER — Encounter: Payer: Self-pay | Admitting: Internal Medicine

## 2012-09-22 ENCOUNTER — Non-Acute Institutional Stay: Payer: Medicare Other | Admitting: Internal Medicine

## 2012-09-22 VITALS — BP 126/64 | HR 60 | Ht 64.0 in | Wt 198.0 lb

## 2012-09-22 DIAGNOSIS — E785 Hyperlipidemia, unspecified: Secondary | ICD-10-CM

## 2012-09-22 DIAGNOSIS — E669 Obesity, unspecified: Secondary | ICD-10-CM

## 2012-09-22 DIAGNOSIS — I6789 Other cerebrovascular disease: Secondary | ICD-10-CM

## 2012-09-22 DIAGNOSIS — I10 Essential (primary) hypertension: Secondary | ICD-10-CM

## 2012-09-22 NOTE — Progress Notes (Signed)
  Subjective:    Patient ID: Julie Haas, female    DOB: [birth year], 79 y.o.   MRN: [id number]  HPI BP doing well.  She feels she is over her depression. Had prolonged grief reaction from her husband's death. Sleeping OK.  Resumed attending church. Feels supported.  Current Outpatient Prescriptions on File Prior to Visit  Medication Sig Dispense Refill  . Cholecalciferol (VITAMIN D PO) Take 1,000 Units by mouth daily. Take one capsule twice daily      . losartan (COZAAR) 50 MG tablet Take 50 mg by mouth daily.      . metoprolol tartrate (LOPRESSOR) 25 MG tablet TAKE 1 TABLET TWICE DAILY.  60 tablet  1   No current facility-administered medications on file prior to visit.    Review of Systems  Constitutional: Negative for fever, activity change, appetite change and fatigue.  Eyes: Negative.   Respiratory: Negative.   Cardiovascular: Negative for chest pain, palpitations and leg swelling.  Gastrointestinal: Negative.   Endocrine: Negative.   Musculoskeletal: Negative.   Skin: Negative.   Neurological: Negative.   Hematological: Negative.   Psychiatric/Behavioral: Negative.        Objective:BP 126/64  Pulse 60  Ht 5\' 4"  (1.626 m)  Wt 198 lb (89.812 kg)  BMI 33.97 kg/m2    Physical Exam  Constitutional:  overwewight  HENT:  Right Ear: External ear normal.  Left Ear: External ear normal.  Nose: Nose normal.  Eyes:  Corrective lenses.  Neck: Normal range of motion. Neck supple. No JVD present. No tracheal deviation present. No thyromegaly present.  Cardiovascular: Normal rate, regular rhythm, normal heart sounds and intact distal pulses.  Exam reveals no gallop and no friction rub.   No murmur heard. Varicose leg veins.  Pulmonary/Chest: Breath sounds normal. No respiratory distress. She has no wheezes. She has no rales.  Abdominal: Soft. Bowel sounds are normal. She exhibits no distension and no mass. There is no tenderness.  Musculoskeletal: Normal range of  motion. She exhibits no edema and no tenderness.  Lymphadenopathy:    She has no cervical adenopathy.  Neurological: She is alert. No cranial nerve deficit. Coordination normal.  Skin: No rash noted. No erythema. No pallor.  Psychiatric: She has a normal mood and affect. Her behavior is normal. Judgment and thought content normal.          Assessment & Plan:  Hyperlipidemia: recheck prior to next visit. Not using medication at present.  Other generalized ischemic cerebrovascular disease: memory is intact. Noted on a prior brain scan.  Obesity, unspecified: weight unchanged  Varicose veins of lower extremities with inflammation, unspecified laterality:no pain  HTN (hypertension): controlled

## 2012-10-01 ENCOUNTER — Other Ambulatory Visit: Payer: Self-pay | Admitting: *Deleted

## 2012-10-01 MED ORDER — METOPROLOL TARTRATE 25 MG PO TABS: ORAL_TABLET | ORAL | Status: DC

## 2013-01-01 ENCOUNTER — Other Ambulatory Visit: Payer: Self-pay

## 2013-01-01 MED ORDER — LOSARTAN POTASSIUM 50 MG PO TABS: 50.0000 mg | ORAL_TABLET | Freq: Every day | ORAL | Status: DC

## 2013-01-18 ENCOUNTER — Ambulatory Visit (INDEPENDENT_AMBULATORY_CARE_PROVIDER_SITE_OTHER): Payer: Medicare Other | Admitting: Family Medicine

## 2013-01-18 VITALS — BP 110/66 | HR 68 | Temp 98.2°F | Resp 18 | Ht 64.75 in | Wt 200.0 lb

## 2013-01-18 DIAGNOSIS — L03119 Cellulitis of unspecified part of limb: Secondary | ICD-10-CM

## 2013-01-18 DIAGNOSIS — Z23 Encounter for immunization: Secondary | ICD-10-CM

## 2013-01-18 DIAGNOSIS — S81802A Unspecified open wound, left lower leg, initial encounter: Secondary | ICD-10-CM

## 2013-01-18 DIAGNOSIS — S81009A Unspecified open wound, unspecified knee, initial encounter: Secondary | ICD-10-CM

## 2013-01-18 DIAGNOSIS — L02419 Cutaneous abscess of limb, unspecified: Secondary | ICD-10-CM

## 2013-01-18 LAB — POCT CBC
Granulocyte percent: 64.6 %G (ref 37–80)
HCT, POC: 41.7 % (ref 37.7–47.9)
Hemoglobin: 12.9 g/dL (ref 12.2–16.2)
MCH, POC: 31 pg (ref 27–31.2)
MCV: 100.3 fL — AB (ref 80–97)
MID (cbc): 0.4 (ref 0–0.9)
POC Granulocyte: 3.6 (ref 2–6.9)
Platelet Count, POC: 225 10*3/uL (ref 142–424)
RBC: 4.16 M/uL (ref 4.04–5.48)
WBC: 5.6 10*3/uL (ref 4.6–10.2)

## 2013-01-18 MED ORDER — DOXYCYCLINE HYCLATE 100 MG PO TABS: 100.0000 mg | ORAL_TABLET | Freq: Two times a day (BID) | ORAL | Status: DC

## 2013-01-18 NOTE — Progress Notes (Signed)
[facility] [address] [place] [address] [phone]  Date:  01/18/2013   Name:  Julie Haas   DOB:  1925/05/15   MRN:  [id number]  PCP:  Kimber Relic, MD    Chief Complaint: Wound Check   History of Present Illness:  Julie Haas is a 79 y.o. very pleasant female patient who presents with the following:  About one week ago she was getting into a bus and scraped her anterior left shin on a bus step.   She has tried hot compresses, ice and heat.   This occurred 8 days ago.  She noted it was red over the last couple of days.  Her friend who is a retired MD looked at it and was concerned  She lives at [facility] and has access to a nurse there.  She has not noted any fever.  She does not feel ill.  She is eating normally.    Patient Active Problem List   Diagnosis Date Noted  . Hyperlipidemia   . Syncope 10/09/2010  . HTN (hypertension) 10/09/2010  . Other generalized ischemic cerebrovascular disease 06/27/2009  . Obesity, unspecified 07/25/2006  . Varicose veins of lower extremities with inflammation 03/16/2003    Past Medical History  Diagnosis Date  . Hyperlipidemia   . Arrhythmia     PVC  . Retinal detachment   . Renal disorder   . Allergy   . Occlusion and stenosis of carotid artery without mention of cerebral infarction 03/24/2012  . Pneumonia, organism unspecified 11/192013  . Depressive disorder, not elsewhere classified 03/05/2011  . Other premature beats 03/05/2011  . Other seborrheic keratosis 11/20/2010  . Syncope and collapse 10/02/2010  . Palpitations 11/21/2009  . Unspecified essential hypertension 06/27/2009  . Cardiomegaly 06/27/2009  . Other generalized ischemic cerebrovascular disease 06/27/2009  . Dizziness and giddiness 06/27/2009  . Unspecified vitamin D deficiency 05/16/2009  . Family history of sudden cardiac death (SCD) 12-17-08  . Rash and other nonspecific skin eruption 07/05/2008  . Edema 03/15/2008  .  Obesity, unspecified 07/25/2006  . Varicose veins of lower extremities with inflammation 03/16/2003    Past Surgical History  Procedure Laterality Date  . Cholecystectomy  1983  . Cataract extraction Bilateral     Dr. Cecilie Kicks  . Tonsillectomy    . Keratosis    . Retinal detachment surgery Left     History  Substance Use Topics  . Smoking status: Never Smoker   . Smokeless tobacco: Never Used  . Alcohol Use: No    Family History  Problem Relation Age of Onset  . Arrhythmia      Long QT in first degree relatives,  . Heart attack Father   . Heart disease Father   . Heart attack Brother   . Heart disease Brother   . Pneumonia Mother     Allergies  Allergen Reactions  . Codeine     Unknown  . Diuretic [Buchu-Cornsilk-Ch Grass-Hydran]     Unknown  . Lipitor [Atorvastatin Calcium]     Unknown  . Penicillins Other (See Comments)    unknown  . Pravachol     Unknown    Medication list has been reviewed and updated.  Current Outpatient Prescriptions on File Prior to Visit  Medication Sig Dispense Refill  . Cholecalciferol (VITAMIN D PO) Take 1,000 Units by mouth daily. Take one capsule twice daily      . losartan (COZAAR) 50 MG tablet Take 1  tablet (50 mg total) by mouth daily.  30 tablet  6  . metoprolol tartrate (LOPRESSOR) 25 MG tablet TAKE 1 TABLET TWICE DAILY.  60 tablet  6   No current facility-administered medications on file prior to visit.    Review of Systems:  As per HPI- otherwise negative.   Physical Examination: Filed Vitals:   01/18/13 1504  BP: 110/66  Pulse: 68  Temp: 98.2 F (36.8 C)  Resp: 18   Filed Vitals:   01/18/13 1504  Height: 5' 4.75" (1.645 m)  Weight: 200 lb (90.719 kg)   Body mass index is 33.52 kg/(m^2). Ideal Body Weight: Weight in (lb) to have BMI = 25: 148.8  GEN: WDWN, NAD, Non-toxic, A & O x 3, obese, looks well HEENT: Atraumatic, Normocephalic. Neck supple. No masses, No LAD. Ears and Nose: No external  deformity. CV: RRR, No M/G/R. No JVD. No thrill. No extra heart sounds. PULM: CTA B, no wheezes, crackles, rhonchi. No retractions. No resp. distress. No accessory muscle use. EXTR: No c/c/e NEURO Normal gait.  PSYCH: Normally interactive. Conversant. Not depressed or anxious appearing.  Calm demeanor.  Left shin: there is a nickel sized lesion on the anterior portion of the shin with a soft eschar,  with surrounding redness, minimal edema and tenderness. Calf is normal, no evidence of DVT  Results for orders placed in visit on 01/18/13  POCT CBC      Result Value Range   WBC 5.6  4.6 - 10.2 K/uL   Lymph, poc 1.6  0.6 - 3.4   POC LYMPH PERCENT 28.6  10 - 50 %L   MID (cbc) 0.4  0 - 0.9   POC MID % 6.8  0 - 12 %M   POC Granulocyte 3.6  2 - 6.9   Granulocyte percent 64.6  37 - 80 %G   RBC 4.16  4.04 - 5.48 M/uL   Hemoglobin 12.9  12.2 - 16.2 g/dL   HCT, POC 16.1  09.6 - 47.9 %   MCV 100.3 (*) 80 - 97 fL   MCH, POC 31.0  27 - 31.2 pg   MCHC 30.9 (*) 31.8 - 35.4 g/dL   RDW, POC 04.5     Platelet Count, POC 225  142 - 424 K/uL   MPV 9.5  0 - 99.8 fL     Assessment and Plan: Cellulitis of ankle - Plan: doxycycline (VIBRA-TABS) 100 MG tablet, POCT CBC  Open wound, lower leg, left, initial encounter - Plan: Td vaccine greater than or equal to 7yo preservative free IM  Update td today- her last shot was in 2003.  Treat with doxycycline BID.   Wound cleaned and dressed  Signed Abbe Amsterdam, MD

## 2013-01-18 NOTE — Patient Instructions (Signed)
Use the doxycycline antibiotic as directed.   Please let us know if you are not better or are worse in the next couple of days.    Please have the nurse at [facility] help you dress your wound over the next few days.

## 2013-01-19 ENCOUNTER — Encounter: Payer: Medicare Other | Admitting: Internal Medicine

## 2013-01-20 ENCOUNTER — Ambulatory Visit (INDEPENDENT_AMBULATORY_CARE_PROVIDER_SITE_OTHER): Payer: Medicare Other | Admitting: Nurse Practitioner

## 2013-01-20 ENCOUNTER — Encounter: Payer: Self-pay | Admitting: Nurse Practitioner

## 2013-01-20 VITALS — BP 130/74 | HR 72 | Temp 97.1°F | Resp 14 | Wt 198.2 lb

## 2013-01-20 DIAGNOSIS — L02419 Cutaneous abscess of limb, unspecified: Secondary | ICD-10-CM

## 2013-01-20 DIAGNOSIS — L03115 Cellulitis of right lower limb: Secondary | ICD-10-CM

## 2013-01-20 NOTE — Patient Instructions (Addendum)
-  Cont to elevate leg (above heart)  -monitor for increase heat, swelling, and redness -may take a probiotic daily or yogurt to help restore gut flora  -to follow up with Dr Chilton Si at [facility] in 1 week (if he has no availability then follow up with me on next Thursday)   Cellulitis Cellulitis is an infection of the skin and the tissue beneath it. The infected area is usually red and tender. Cellulitis occurs most often in the arms and lower legs.  CAUSES  Cellulitis is caused by bacteria that enter the skin through cracks or cuts in the skin. The most common types of bacteria that cause cellulitis are Staphylococcus and Streptococcus. SYMPTOMS   Redness and warmth.  Swelling.  Tenderness or pain.  Fever. DIAGNOSIS  Your caregiver can usually determine what is wrong based on a physical exam. Blood tests may also be done. TREATMENT  Treatment usually involves taking an antibiotic medicine. HOME CARE INSTRUCTIONS   Take your antibiotics as directed. Finish them even if you start to feel better.  Keep the infected arm or leg elevated to reduce swelling.  Apply a warm cloth to the affected area up to 4 times per day to relieve pain.  Only take over-the-counter or prescription medicines for pain, discomfort, or fever as directed by your caregiver.  Keep all follow-up appointments as directed by your caregiver. SEEK MEDICAL CARE IF:   You notice red streaks coming from the infected area.  Your red area gets larger or turns dark in color.  Your bone or joint underneath the infected area becomes painful after the skin has healed.  Your infection returns in the same area or another area.  You notice a swollen bump in the infected area.  You develop new symptoms. SEEK IMMEDIATE MEDICAL CARE IF:   You have a fever.  You feel very sleepy.  You develop vomiting or diarrhea.  You have a general ill feeling (malaise) with muscle aches and pains. MAKE SURE YOU:   Understand these  instructions.  Will watch your condition.  Will get help right away if you are not doing well or get worse. Document Released: 11/14/2004 Document Revised: 08/06/2011 Document Reviewed: 04/22/2011 [facility] Patient Information 2014 [place], [place].

## 2013-01-20 NOTE — Progress Notes (Signed)
Patient ID: Julie Haas, female   DOB: 02/04/1926, 79 y.o.   MRN: [id number]    Allergies  Allergen Reactions  . Codeine     Unknown  . Diuretic [Buchu-Cornsilk-Ch Grass-Hydran]     Unknown  . Lipitor [Atorvastatin Calcium]     Unknown  . Penicillins Other (See Comments)    unknown  . Pravachol     Unknown    Chief Complaint  Patient presents with  . Acute Visit    f/u [facility] Urgent care for blood infection on 01/18/13.  she hit her leg before Thanksgiving and is on Doxicycline.    HPI: Patient is a 79 y.o. female seen in the office today for follow up on cellulitis About one week ago she was getting into a bus and scraped her anterior left shin on a bus step, 2 days ago she went to urgent care for swelling and redness  Was started on doxycyline for a 10 day course, is tolerating medication well.  Swelling was worse yesterday but after elevating leg last night swelling and redness has improved today No fevers or chills. Overall feels well  Review of Systems:  Review of Systems  Constitutional: Negative for fever, chills and malaise/fatigue.  Respiratory: Negative for shortness of breath.   Cardiovascular: Positive for leg swelling (to right leg). Negative for chest pain and palpitations.  Gastrointestinal: Negative for abdominal pain, diarrhea and constipation.  Musculoskeletal: Negative for myalgias.  Skin: Negative for itching and rash.  Neurological: Negative for weakness and headaches.     Past Medical History  Diagnosis Date  . Hyperlipidemia   . Arrhythmia     PVC  . Retinal detachment   . Renal disorder   . Allergy   . Occlusion and stenosis of carotid artery without mention of cerebral infarction 03/24/2012  . Pneumonia, organism unspecified 11/192013  . Depressive disorder, not elsewhere classified 03/05/2011  . Other premature beats 03/05/2011  . Other seborrheic keratosis 11/20/2010  . Syncope and collapse 10/02/2010  . Palpitations 11/21/2009  .  Unspecified essential hypertension 06/27/2009  . Cardiomegaly 06/27/2009  . Other generalized ischemic cerebrovascular disease 06/27/2009  . Dizziness and giddiness 06/27/2009  . Unspecified vitamin D deficiency 05/16/2009  . Family history of sudden cardiac death (SCD) 2008-12-08  . Rash and other nonspecific skin eruption 07/05/2008  . Edema 03/15/2008  . Obesity, unspecified 07/25/2006  . Varicose veins of lower extremities with inflammation 03/16/2003   Past Surgical History  Procedure Laterality Date  . Cholecystectomy  1983  . Cataract extraction Bilateral     Dr. Cecilie Kicks  . Tonsillectomy    . Keratosis    . Retinal detachment surgery Left    Social History:   reports that she has never smoked. She has never used smokeless tobacco. She reports that she does not drink alcohol or use illicit drugs.  Family History  Problem Relation Age of Onset  . Arrhythmia      Long QT in first degree relatives,  . Heart attack Father   . Heart disease Father   . Heart attack Brother   . Heart disease Brother   . Pneumonia Mother     Medications: Patient's Medications  New Prescriptions   No medications on file  Previous Medications   CHOLECALCIFEROL (VITAMIN D PO)    Take 1,000 Units by mouth daily. Take one capsule twice daily   DOXYCYCLINE (VIBRA-TABS) 100 MG TABLET    Take 1 tablet (100 mg total) by mouth 2 (  two) times daily.   LOSARTAN (COZAAR) 50 MG TABLET    Take 1 tablet (50 mg total) by mouth daily.   METOPROLOL TARTRATE (LOPRESSOR) 25 MG TABLET    TAKE 1 TABLET TWICE DAILY.  Modified Medications   No medications on file  Discontinued Medications   No medications on file     Physical Exam:  Filed Vitals:   01/20/13 1534  BP: 130/74  Pulse: 72  Temp: 97.1 F (36.2 C)  TempSrc: Oral  Resp: 14  Weight: 198 lb 3.2 oz (89.903 kg)  SpO2: 96%   Physical Exam  Constitutional: She is oriented to person, place, and time and well-developed, well-nourished, and in no distress.  No distress.  Cardiovascular: Normal rate, regular rhythm, normal heart sounds and intact distal pulses.   Pulmonary/Chest: Effort normal and breath sounds normal. No respiratory distress.  Abdominal: Soft. Bowel sounds are normal. She exhibits no distension.  Musculoskeletal: She exhibits edema. She exhibits no tenderness.  Neurological: She is alert and oriented to person, place, and time.  Skin: Skin is warm and dry. She is not diaphoretic. There is erythema (to right lower leg with 1 open area, erythema less than line that was noting redness on 12/1; no heat, +1 edema).  Psychiatric: Affect normal.     Labs reviewed: CBC:  Recent Labs  01/18/13 1546  WBC 5.6  HGB 12.9  HCT 41.7  MCV 100.3*    Assessment/Plan 1. Cellulitis of leg, right -redness, swelling and heat has improved since starting doxycycline -cont course of antibiotics -may use probiotic or yogurt daily for GI health -cont to elevate leg  -to follow up in 1 week regarding cellulitis  -given warning signs and when to return before 1 week

## 2013-01-26 ENCOUNTER — Non-Acute Institutional Stay: Payer: Medicare Other | Admitting: Internal Medicine

## 2013-01-26 ENCOUNTER — Encounter: Payer: Self-pay | Admitting: Internal Medicine

## 2013-01-26 VITALS — BP 152/76 | HR 60 | Wt 199.5 lb

## 2013-01-26 DIAGNOSIS — S81009A Unspecified open wound, unspecified knee, initial encounter: Secondary | ICD-10-CM

## 2013-01-26 DIAGNOSIS — S81802A Unspecified open wound, left lower leg, initial encounter: Secondary | ICD-10-CM | POA: Insufficient documentation

## 2013-01-26 DIAGNOSIS — I10 Essential (primary) hypertension: Secondary | ICD-10-CM

## 2013-01-26 NOTE — Progress Notes (Signed)
   Subjective:    Patient ID: Julie Haas, female    DOB: 03/11/1925, 79 y.o.   MRN: [id number]  Chief Complaint  Patient presents with  . Medical Managment of Chronic Issues    right leg cellulitis follow up    HPI  Hit left leg on a bus step and sustained a hematoma on the Sat before Thanksgiving. Was seen at Urgent Care and then at [facility]. Was put on doxycycline and Neosporin ointment. Has some swelling of the left leg. Sleeps with it elevated.  Current Outpatient Prescriptions on File Prior to Visit  Medication Sig Dispense Refill  . Cholecalciferol (VITAMIN D PO) Take 1,000 Units by mouth daily. Take one capsule twice daily      . doxycycline (VIBRA-TABS) 100 MG tablet Take 1 tablet (100 mg total) by mouth 2 (two) times daily.  20 tablet  0  . losartan (COZAAR) 50 MG tablet Take 1 tablet (50 mg total) by mouth daily.  30 tablet  6  . metoprolol tartrate (LOPRESSOR) 25 MG tablet TAKE 1 TABLET TWICE DAILY.  60 tablet  6   No current facility-administered medications on file prior to visit.    Review of Systems  Constitutional: Negative for fever, activity change, appetite change and fatigue.  Eyes: Negative.   Respiratory: Negative.   Cardiovascular: Negative for chest pain, palpitations and leg swelling.  Gastrointestinal: Negative.   Endocrine: Negative.   Musculoskeletal: Negative.   Skin:       Traumatic injury of the left shin  Neurological: Negative.   Hematological: Negative.   Psychiatric/Behavioral: Negative.        Objective:BP 152/76  Pulse 60  Wt 199 lb 8 oz (90.493 kg)    Physical Exam  Constitutional:  overwewight  HENT:  Right Ear: External ear normal.  Left Ear: External ear normal.  Nose: Nose normal.  Eyes:  Corrective lenses.  Neck: Normal range of motion. Neck supple. No JVD present. No tracheal deviation present. No thyromegaly present.  Cardiovascular: Normal rate, regular rhythm, normal heart sounds and intact distal pulses.  Exam  reveals no gallop and no friction rub.   No murmur heard. Varicose leg veins.  Pulmonary/Chest: Breath sounds normal. No respiratory distress. She has no wheezes. She has no rales.  Abdominal: Soft. Bowel sounds are normal. She exhibits no distension and no mass. There is no tenderness.  Musculoskeletal: Normal range of motion. She exhibits no edema and no tenderness.  Lymphadenopathy:    She has no cervical adenopathy.  Neurological: She is alert. No cranial nerve deficit. Coordination normal.  Skin: No rash noted. No erythema. No pallor.  Small hematoma left shin. Open area is about 6mm by 14 mm. Some swelling of the leg above this. Small hematoma at superior end of the injury.  Psychiatric: She has a normal mood and affect. Her behavior is normal. Judgment and thought content normal.       Assessment & Plan:  Wound of left leg, initial encounter: healing. No infection.  HTN (hypertension): controlled

## 2013-01-26 NOTE — Patient Instructions (Signed)
Finish doxycycline

## 2013-02-24 ENCOUNTER — Other Ambulatory Visit (HOSPITAL_COMMUNITY): Payer: Self-pay | Admitting: Nurse Practitioner

## 2013-02-24 ENCOUNTER — Encounter: Payer: Self-pay | Admitting: Nurse Practitioner

## 2013-02-24 ENCOUNTER — Ambulatory Visit (INDEPENDENT_AMBULATORY_CARE_PROVIDER_SITE_OTHER): Payer: Medicare Other | Admitting: Nurse Practitioner

## 2013-02-24 ENCOUNTER — Ambulatory Visit (HOSPITAL_COMMUNITY)
Admission: RE | Admit: 2013-02-24 | Discharge: 2013-02-24 | Disposition: A | Discharge status: Home / Self Care | Payer: Medicare Other | Source: Ambulatory Visit | Attending: Vascular Surgery | Admitting: Vascular Surgery

## 2013-02-24 ENCOUNTER — Telehealth: Payer: Self-pay

## 2013-02-24 VITALS — BP 130/76 | HR 70 | Temp 96.5°F | Resp 14 | Wt 201.8 lb

## 2013-02-24 DIAGNOSIS — S81802D Unspecified open wound, left lower leg, subsequent encounter: Secondary | ICD-10-CM

## 2013-02-24 DIAGNOSIS — M7989 Other specified soft tissue disorders: Secondary | ICD-10-CM

## 2013-02-24 DIAGNOSIS — Z5189 Encounter for other specified aftercare: Secondary | ICD-10-CM

## 2013-02-24 NOTE — Telephone Encounter (Signed)
Preliminary Report- Patient was negative for DVT, patient was already released.

## 2013-02-24 NOTE — Addendum Note (Signed)
Addended by: Pricilla Larsson on: 02/24/2013 03:07 PM   Modules accepted: Orders

## 2013-02-24 NOTE — Progress Notes (Signed)
Verbal preliminary report given to Chrae at [facility] @16 :57.

## 2013-02-24 NOTE — Progress Notes (Addendum)
Patient ID: Julie Haas, female   DOB: 1925-11-16, 80 y.o.   MRN: [id number]    Allergies  Allergen Reactions  . Codeine     Unknown  . Diuretic [Buchu-Cornsilk-Ch Grass-Hydran]     Unknown  . Lipitor [Atorvastatin Calcium]     Unknown  . Penicillins Other (See Comments)    unknown  . Pravachol     Unknown    Chief Complaint  Patient presents with  . Medical Managment of Chronic Issues    1 month f/u LT leg    HPI: Patient is a 80 y.o. femaleseen in the office today for follow up on left leg cellulitis Reports the wound has heeled, swelling is unchanged, no heat no pain  Review of Systems:  Review of Systems  Constitutional: Negative for fever, chills and malaise/fatigue.  Respiratory: Negative for shortness of breath.   Cardiovascular: Positive for leg swelling (to left leg). Negative for chest pain and palpitations.  Gastrointestinal: Negative for abdominal pain, diarrhea and constipation.  Musculoskeletal: Negative for myalgias.  Skin: Negative for itching and rash.  Neurological: Negative for weakness and headaches.     Past Medical History  Diagnosis Date  . Hyperlipidemia   . Arrhythmia     PVC  . Retinal detachment   . Renal disorder   . Allergy   . Occlusion and stenosis of carotid artery without mention of cerebral infarction 03/24/2012  . Pneumonia, organism unspecified 11/192013  . Depressive disorder, not elsewhere classified 03/05/2011  . Other premature beats 03/05/2011  . Other seborrheic keratosis 11/20/2010  . Syncope and collapse 10/02/2010  . Palpitations 11/21/2009  . Unspecified essential hypertension 06/27/2009  . Cardiomegaly 06/27/2009  . Other generalized ischemic cerebrovascular disease 06/27/2009  . Dizziness and giddiness 06/27/2009  . Unspecified vitamin D deficiency 05/16/2009  . Family history of sudden cardiac death (SCD) 22-Dec-2008  . Rash and other nonspecific skin eruption 07/05/2008  . Edema 03/15/2008  . Obesity, unspecified  07/25/2006  . Varicose veins of lower extremities with inflammation 03/16/2003   Past Surgical History  Procedure Laterality Date  . Cholecystectomy  1983  . Cataract extraction Bilateral     Dr. [name]  . Tonsillectomy    . Keratosis    . Retinal detachment surgery Left    Social History:   reports that she has never smoked. She has never used smokeless tobacco. She reports that she does not drink alcohol or use illicit drugs.  Family History  Problem Relation Age of Onset  . Arrhythmia      Long QT in first degree relatives,  . Heart attack Father   . Heart disease Father   . Heart attack Brother   . Heart disease Brother   . Pneumonia Mother     Medications: Patient's Medications  New Prescriptions   No medications on file  Previous Medications   CHOLECALCIFEROL (VITAMIN D PO)    Take 1,000 Units by mouth daily. Take one capsule twice daily   LOSARTAN (COZAAR) 50 MG TABLET    Take 1 tablet (50 mg total) by mouth daily.   METOPROLOL TARTRATE (LOPRESSOR) 25 MG TABLET    TAKE 1 TABLET TWICE DAILY.  Modified Medications   No medications on file  Discontinued Medications   DOXYCYCLINE (VIBRA-TABS) 100 MG TABLET    Take 1 tablet (100 mg total) by mouth 2 (two) times daily.     Physical Exam:  Filed Vitals:   02/24/13 1424  BP: 130/76  Pulse: 70  Temp: 96.5 F (35.8 C)  TempSrc: Oral  Resp: 14  Weight: 201 lb 12.8 oz (91.536 kg)   Physical Exam  Constitutional: She is oriented to person, place, and time and well-developed, well-nourished, and in no distress. No distress.  Cardiovascular: Normal rate, regular rhythm, normal heart sounds and intact distal pulses.   Pulmonary/Chest: Effort normal and breath sounds normal. No respiratory distress.  Abdominal: Soft. Bowel sounds are normal. She exhibits no distension.  Musculoskeletal: She exhibits edema (and leg is tight). She exhibits no tenderness.  Neurological: She is alert and oriented to person, place, and time.   Skin: Skin is warm and dry. She is not diaphoretic. There is erythema (slight erythema to right lower ext).  Psychiatric: Affect normal.    Labs reviewed:  Recent Labs  01/18/13 1546  WBC 5.6  HGB 12.9  HCT 41.7  MCV 100.3*      Assessment/Plan 1. Wound of left leg, subsequent encounter -resolved   2. Swelling of limb -pt remains with tenderness on palpitation with edema and tightness  -no pain on walking or with movement - Lower Extremity Venous Duplex to left leg to rule out DVT   To keep follow up with Dr Nyoka Cowden in feb

## 2013-02-26 ENCOUNTER — Telehealth: Payer: Self-pay | Admitting: *Deleted

## 2013-02-26 NOTE — Telephone Encounter (Signed)
Pt was notified VIA of results.

## 2013-03-22 ENCOUNTER — Encounter (HOSPITAL_COMMUNITY): Payer: Medicare Other

## 2013-03-22 ENCOUNTER — Other Ambulatory Visit (HOSPITAL_COMMUNITY): Payer: Self-pay | Admitting: Cardiology

## 2013-03-22 DIAGNOSIS — I6529 Occlusion and stenosis of unspecified carotid artery: Secondary | ICD-10-CM

## 2013-03-22 LAB — BASIC METABOLIC PANEL
BUN: 17 mg/dL (ref 4–21)
CREATININE: 0.6 mg/dL (ref 0.5–1.1)
Glucose: 90 mg/dL
Potassium: 4.1 mmol/L (ref 3.4–5.3)
Sodium: 139 mmol/L (ref 137–147)

## 2013-03-22 LAB — LIPID PANEL
CHOLESTEROL: 229 mg/dL — AB (ref 0–200)
HDL: 63 mg/dL (ref 35–70)
LDL Cholesterol: 148 mg/dL
Triglycerides: 92 mg/dL (ref 40–160)

## 2013-03-22 LAB — HEPATIC FUNCTION PANEL
ALT: 9 U/L (ref 7–35)
AST: 17 U/L (ref 13–35)
Alkaline Phosphatase: 88 U/L (ref 25–125)
Bilirubin, Total: 0.5 mg/dL

## 2013-03-29 ENCOUNTER — Ambulatory Visit (HOSPITAL_COMMUNITY): Payer: Medicare Other | Attending: Cardiology

## 2013-03-29 DIAGNOSIS — I658 Occlusion and stenosis of other precerebral arteries: Secondary | ICD-10-CM | POA: Insufficient documentation

## 2013-03-29 DIAGNOSIS — I10 Essential (primary) hypertension: Secondary | ICD-10-CM | POA: Insufficient documentation

## 2013-03-29 DIAGNOSIS — E785 Hyperlipidemia, unspecified: Secondary | ICD-10-CM | POA: Insufficient documentation

## 2013-03-29 DIAGNOSIS — I6529 Occlusion and stenosis of unspecified carotid artery: Secondary | ICD-10-CM | POA: Insufficient documentation

## 2013-03-29 DIAGNOSIS — R0989 Other specified symptoms and signs involving the circulatory and respiratory systems: Secondary | ICD-10-CM | POA: Insufficient documentation

## 2013-03-30 ENCOUNTER — Encounter: Payer: Self-pay | Admitting: Internal Medicine

## 2013-03-30 ENCOUNTER — Non-Acute Institutional Stay: Payer: Medicare Other | Admitting: Internal Medicine

## 2013-03-30 VITALS — BP 134/72 | HR 60 | Temp 95.9°F | Ht 63.5 in | Wt 198.0 lb

## 2013-03-30 DIAGNOSIS — I8310 Varicose veins of unspecified lower extremity with inflammation: Secondary | ICD-10-CM

## 2013-03-30 DIAGNOSIS — S81809A Unspecified open wound, unspecified lower leg, initial encounter: Secondary | ICD-10-CM

## 2013-03-30 DIAGNOSIS — S81802A Unspecified open wound, left lower leg, initial encounter: Secondary | ICD-10-CM

## 2013-03-30 DIAGNOSIS — I10 Essential (primary) hypertension: Secondary | ICD-10-CM

## 2013-03-30 DIAGNOSIS — S91009A Unspecified open wound, unspecified ankle, initial encounter: Secondary | ICD-10-CM

## 2013-03-30 DIAGNOSIS — I6529 Occlusion and stenosis of unspecified carotid artery: Secondary | ICD-10-CM

## 2013-03-30 DIAGNOSIS — S81009A Unspecified open wound, unspecified knee, initial encounter: Secondary | ICD-10-CM

## 2013-03-30 DIAGNOSIS — E669 Obesity, unspecified: Secondary | ICD-10-CM

## 2013-03-30 DIAGNOSIS — I6789 Other cerebrovascular disease: Secondary | ICD-10-CM

## 2013-03-30 DIAGNOSIS — E785 Hyperlipidemia, unspecified: Secondary | ICD-10-CM

## 2013-03-30 NOTE — Progress Notes (Signed)
Patient ID: Julie Haas, female   DOB: 1925/10/08, 80 y.o.   MRN: [id number]    Nursing Home Location:  [place] of Service: Clinic (12)  PCP: Estill Dooms, MD  Code Status: LIVING WILL  Allergies  Allergen Reactions  . Codeine     Unknown  . Diuretic [Buchu-Cornsilk-Ch Grass-Hydran]     Unknown  . Lipitor [Atorvastatin Calcium]     Unknown  . Penicillins Other (See Comments)    unknown  . Pravachol     Unknown    Chief Complaint  Patient presents with  . Medical Managment of Chronic Issues    Comprehensive exam: blood pressure, cholesterol,    HPI:  HTN (hypertension)" controlled  Hyperlipidemia: controlled  Obesity, unspecified: no weight loss  Wound of left leg: still with thin scabs at the left lower leg anteriorly  Other generalized ischemic cerebrovascular disease: noted on prior Brain scan  Varicose veins of lower extremities with inflammation: sstable  Occlusion and stenosis of carotid artery without mention of cerebral infarction:  Had carotid Doppler by Dr. Percival Spanish yesterday      Past Medical History  Diagnosis Date  . Hyperlipidemia   . Arrhythmia     PVC  . Retinal detachment   . Renal disorder   . Allergy   . Occlusion and stenosis of carotid artery without mention of cerebral infarction 03/24/2012  . Pneumonia, organism unspecified 11/192013  . Depressive disorder, not elsewhere classified 03/05/2011  . Other premature beats 03/05/2011  . Other seborrheic keratosis 11/20/2010  . Syncope and collapse 10/02/2010  . Palpitations 11/21/2009  . Unspecified essential hypertension 06/27/2009  . Cardiomegaly 06/27/2009  . Other generalized ischemic cerebrovascular disease 06/27/2009  . Dizziness and giddiness 06/27/2009  . Unspecified vitamin D deficiency 05/16/2009  . Family history of sudden cardiac death (SCD) December 10, 2008  . Rash and other nonspecific skin eruption 07/05/2008  . Edema 03/15/2008  . Obesity, unspecified  07/25/2006  . Varicose veins of lower extremities with inflammation 03/16/2003    Past Surgical History  Procedure Laterality Date  . Cholecystectomy  1983  . Cataract extraction Bilateral     Dr. [name]  . Tonsillectomy    . Keratosis    . Retinal detachment surgery Left     CONSULTANTS Cardio: [employer]:   PAST PROCEDURES 01/14/96 CT chest: improving inflammatory process in RUL 03/16/96 CXR; resolved RUL pneumonia 05/23/98 CXR; chronic changes in RML 06/24/01 Bone density: upper osteopenic range 07/29/04 CXR: cardiomegaly 03/17/08 Venous Doppler: no clot 01/23/09 CXR: bilateral multilobar patchy and nodular pulmonary opacity 06/23/09 MRI brain: no acute findings 09/09/10 CT brain: atrophy. SVD. 09/10/10 2D Echo:   Social History: History   Social History  . Marital Status: Widowed    Spouse Name: N/A    Number of Children: N/A  . Years of Education: N/A   Occupational History  . Housewife    Social History Main Topics  . Smoking status: Never Smoker   . Smokeless tobacco: Never Used  . Alcohol Use: No  . Drug Use: No  . Sexual Activity: No   Other Topics Concern  . None   Social History Narrative   The patient is widowed, husband died March 06, 2011.  She has two children and two     grandchildren.  She never smoked cigarettes and does not drink alcohol. Drinks 2 cups caffeine coffee,    Lives at [facility] since 09/03/2004   Living Will  Family History Family Status  Relation Status Death Age  . Father Deceased 39  . Brother Deceased 70  . Mother Deceased 31  . Daughter Alive   . Son Alive    Family History  Problem Relation Age of Onset  . Arrhythmia      Long QT in first degree relatives,  . Heart attack Father   . Heart disease Father   . Heart attack Brother   . Heart disease Brother   . Pneumonia Mother      Medications: Patient's Medications  New Prescriptions   No medications on file  Previous Medications   CHOLECALCIFEROL  (VITAMIN D PO)    Take 1,000 Units by mouth daily. Take one capsule twice daily   LOSARTAN (COZAAR) 50 MG TABLET    Take 1 tablet (50 mg total) by mouth daily.   METOPROLOL TARTRATE (LOPRESSOR) 25 MG TABLET    TAKE 1 TABLET TWICE DAILY.  Modified Medications   No medications on file  Discontinued Medications   No medications on file    Immunization History  Administered Date(s) Administered  . Influenza Whole 11/19/2011  . Influenza-Unspecified 11/09/2012  . Pneumococcal Polysaccharide-23 02/19/2004  . Td 02/18/2001, 01/18/2013  . Zoster 02/18/2006     Review of Systems  Constitutional: Negative for fever, activity change, appetite change and fatigue.  Eyes: Negative.   Respiratory: Negative.   Cardiovascular: Negative for chest pain, palpitations and leg swelling.  Gastrointestinal: Negative.   Endocrine: Negative.   Musculoskeletal: Negative.   Skin:       Traumatic injury of the left shin  Neurological: Negative.   Hematological: Negative.   Psychiatric/Behavioral: Negative.       Filed Vitals:   03/30/13 1006  BP: 134/72  Pulse: 60  Temp: 95.9 F (35.5 C)  TempSrc: Oral  Height: 5' 3.5" (1.613 m)  Weight: 198 lb (89.812 kg)   Physical Exam  Constitutional: She is oriented to person, place, and time. She appears well-nourished. No distress.  obese  HENT:  Left Ear: External ear normal.  Nose: Nose normal.  Mouth/Throat: Oropharynx is clear and moist.  Eyes: Conjunctivae and EOM are normal. Pupils are equal, round, and reactive to light.  Corrective lenses  Neck: No JVD present. No tracheal deviation present. No thyromegaly present.  Cardiovascular: Normal rate, regular rhythm, normal heart sounds and intact distal pulses.  Exam reveals no gallop and no friction rub.   No murmur heard. Bilateral mild leg varicosities  Respiratory: No respiratory distress. She has no wheezes. She has no rales. She exhibits no tenderness.  GI: She exhibits no distension and  no mass. There is no tenderness.  Genitourinary: Guaiac negative stool. No vaginal discharge found.  atrophic  Musculoskeletal: Normal range of motion. She exhibits no edema and no tenderness.  Lymphadenopathy:    She has no cervical adenopathy.  Neurological: She is alert and oriented to person, place, and time. She has normal reflexes. No cranial nerve deficit. Coordination normal.  Skin:  Soft thin scabs at the left lower leg at site of prior leg injury  Psychiatric: She has a normal mood and affect. Her behavior is normal. Judgment and thought content normal.       Labs reviewed: Nursing Home on 03/30/2013  Component Date Value Range Status  . Glucose 03/22/2013 90   Final  . BUN 03/22/2013 17  4 - 21 mg/dL Final  . Creatinine 03/22/2013 0.6  0.5 - 1.1 mg/dL Final  . Potassium 03/22/2013 4.1  3.4 - 5.3 mmol/L Final  . Sodium 03/22/2013 139  137 - 147 mmol/L Final  . Triglycerides 03/22/2013 92  40 - 160 mg/dL Final  . Cholesterol 03/22/2013 229* 0 - 200 mg/dL Final  . HDL 03/22/2013 63  35 - 70 mg/dL Final  . LDL Cholesterol 03/22/2013 148   Final  . Alkaline Phosphatase 03/22/2013 88  25 - 125 U/L Final  . ALT 03/22/2013 9  7 - 35 U/L Final  . AST 03/22/2013 17  13 - 35 U/L Final  . Bilirubin, Total 03/22/2013 0.5   Final  Office Visit on 01/18/2013  Component Date Value Range Status  . WBC 01/18/2013 5.6  4.6 - 10.2 K/uL Final  . Lymph, poc 01/18/2013 1.6  0.6 - 3.4 Final  . POC LYMPH PERCENT 01/18/2013 28.6  10 - 50 %L Final  . MID (cbc) 01/18/2013 0.4  0 - 0.9 Final  . POC MID % 01/18/2013 6.8  0 - 12 %M Final  . POC Granulocyte 01/18/2013 3.6  2 - 6.9 Final  . Granulocyte percent 01/18/2013 64.6  37 - 80 %G Final  . RBC 01/18/2013 4.16  4.04 - 5.48 M/uL Final  . Hemoglobin 01/18/2013 12.9  12.2 - 16.2 g/dL Final  . HCT, POC 01/18/2013 41.7  37.7 - 47.9 % Final  . MCV 01/18/2013 100.3* 80 - 97 fL Final  . MCH, POC 01/18/2013 31.0  27 - 31.2 pg Final  . MCHC  01/18/2013 30.9* 31.8 - 35.4 g/dL Final  . RDW, POC 01/18/2013 14.0   Final  . Platelet Count, POC 01/18/2013 225  142 - 424 K/uL Final  . MPV 01/18/2013 9.5  0 - 99.8 fL Final   03/30/13 EKG: rate 54. NSR. Normal.   Assessment/Plan HTN (hypertension): controlled  Hyperlipidemia: controlled  Obesity, unspecified: advised weight loss  Wound of left leg: residual scabs LLE  Other generalized ischemic cerebrovascular disease: noted on prior CT brain  Varicose veins of lower extremities with inflammation: unchanged  Occlusion and stenosis of carotid artery without mention of cerebral infarction: unchanged

## 2013-04-01 ENCOUNTER — Telehealth: Payer: Self-pay | Admitting: Cardiology

## 2013-04-01 NOTE — Telephone Encounter (Signed)
New problem   Pt returning call concerning results.

## 2013-04-01 NOTE — Telephone Encounter (Signed)
Called pt with results.

## 2013-04-02 ENCOUNTER — Telehealth: Payer: Self-pay | Admitting: Cardiology

## 2013-04-02 NOTE — Telephone Encounter (Signed)
New Problem:  Pt is calling to hear her recent carotid results.

## 2013-04-02 NOTE — Telephone Encounter (Signed)
Pt aware of results of carotid doppler and will have repeated in 6 months.

## 2013-04-14 ENCOUNTER — Encounter: Payer: Self-pay | Admitting: Internal Medicine

## 2013-05-04 ENCOUNTER — Other Ambulatory Visit: Payer: Self-pay | Admitting: Cardiology

## 2013-05-04 ENCOUNTER — Other Ambulatory Visit: Payer: Self-pay

## 2013-05-04 MED ORDER — METOPROLOL TARTRATE 25 MG PO TABS: ORAL_TABLET | ORAL | Status: DC

## 2013-05-18 ENCOUNTER — Encounter: Payer: Self-pay | Admitting: Internal Medicine

## 2013-05-18 ENCOUNTER — Non-Acute Institutional Stay: Payer: Medicare Other | Admitting: Internal Medicine

## 2013-05-18 VITALS — BP 132/90 | HR 62 | Temp 98.4°F | Wt 198.0 lb

## 2013-05-18 DIAGNOSIS — J209 Acute bronchitis, unspecified: Secondary | ICD-10-CM | POA: Insufficient documentation

## 2013-05-18 DIAGNOSIS — I6529 Occlusion and stenosis of unspecified carotid artery: Secondary | ICD-10-CM

## 2013-05-18 DIAGNOSIS — I10 Essential (primary) hypertension: Secondary | ICD-10-CM

## 2013-05-18 MED ORDER — DOXYCYCLINE HYCLATE 100 MG PO TABS: ORAL_TABLET | ORAL | Status: DC

## 2013-05-18 MED ORDER — DEXTROMETHORPHAN POLISTIREX 30 MG/5ML PO LQCR: ORAL | Status: DC

## 2013-05-18 NOTE — Progress Notes (Signed)
Patient ID: Julie Haas, female   DOB: 03-14-1925, 80 y.o.   MRN: [id number]    Location: [facility]  Place of Service: CLINIC    Allergies  Allergen Reactions  . Codeine     Unknown  . Diuretic [Buchu-Cornsilk-Ch Grass-Hydran]     Unknown  . Lipitor [Atorvastatin Calcium]     Unknown  . Penicillins Other (See Comments)    unknown  . Pravachol     Unknown    Chief Complaint  Patient presents with  . Cough    started week ago, only at night when she goes to bed, chest congestion. Doesn't feel bad. History of pneumonia 17 times, but none since at [facility]    HPI:  Cough for a week. Rattling in the chest. No fever. No nausea.   Medications: Patient's Medications  New Prescriptions   No medications on file  Previous Medications   CHOLECALCIFEROL (VITAMIN D PO)    Take 1,000 Units by mouth daily. Take one capsule twice daily   LOSARTAN (COZAAR) 50 MG TABLET    Take 1 tablet (50 mg total) by mouth daily.   METOPROLOL TARTRATE (LOPRESSOR) 25 MG TABLET    TAKE 1 TABLET TWICE DAILY.  Modified Medications   No medications on file  Discontinued Medications   No medications on file     Review of Systems  Constitutional: Negative for fever, activity change, appetite change and fatigue.  Eyes: Positive for visual disturbance (Corrective lenses).  Respiratory: Positive for cough. Negative for chest tightness, shortness of breath and wheezing.   Cardiovascular: Negative for chest pain, palpitations and leg swelling.  Gastrointestinal: Negative.   Endocrine: Negative.   Musculoskeletal: Negative.   Skin:       Traumatic injury of the left shin  Neurological: Negative.   Hematological: Negative.   Psychiatric/Behavioral: Negative.     Filed Vitals:   05/18/13 0850  BP: 132/90  Pulse: 62  Temp: 98.4 F (36.9 C)  TempSrc: Oral  Weight: 198 lb (89.812 kg)   Physical Exam  Constitutional:  overwewight  HENT:  Right Ear: External ear normal.  Left Ear: External ear  normal.  Nose: Nose normal.  Eyes:  Corrective lenses.  Neck: Normal range of motion. Neck supple. No JVD present. No tracheal deviation present. No thyromegaly present.  Cardiovascular: Normal rate, regular rhythm, normal heart sounds and intact distal pulses.  Exam reveals no gallop and no friction rub.   No murmur heard. Varicose leg veins.  Pulmonary/Chest: No respiratory distress. She has no wheezes. She has rales (bronchial rattle).  Abdominal: Soft. Bowel sounds are normal. She exhibits no distension and no mass. There is no tenderness.  Musculoskeletal: Normal range of motion. She exhibits no edema and no tenderness.  Lymphadenopathy:    She has no cervical adenopathy.  Neurological: She is alert. No cranial nerve deficit. Coordination normal.  Skin: No rash noted. No erythema. No pallor.  Small hematoma left shin. Open area is about 30mm by 14 mm. Some swelling of the leg above this. Small hematoma at superior end of the injury.  Psychiatric: She has a normal mood and affect. Her behavior is normal. Judgment and thought content normal.     Labs reviewed: Nursing Home on 03/30/2013  Component Date Value Ref Range Status  . Glucose 03/22/2013 90   Final  . BUN 03/22/2013 17  4 - 21 mg/dL Final  . Creatinine 03/22/2013 0.6  0.5 - 1.1 mg/dL Final  . Potassium 03/22/2013 4.1  3.4 - 5.3 mmol/L Final  . Sodium 03/22/2013 139  137 - 147 mmol/L Final  . Triglycerides 03/22/2013 92  40 - 160 mg/dL Final  . Cholesterol 03/22/2013 229* 0 - 200 mg/dL Final  . HDL 03/22/2013 63  35 - 70 mg/dL Final  . LDL Cholesterol 03/22/2013 148   Final  . Alkaline Phosphatase 03/22/2013 88  25 - 125 U/L Final  . ALT 03/22/2013 9  7 - 35 U/L Final  . AST 03/22/2013 17  13 - 35 U/L Final  . Bilirubin, Total 03/22/2013 0.5   Final      Assessment/Plan  1. Acute bronchitis - doxycycline (VIBRA-TABS) 100 MG tablet; Take one twice daily for infection  Dispense: 20 tablet; Refill: 0 -  dextromethorphan (DELSYM) 30 MG/5ML liquid; One tsp every 8-12 hours as needed to control cough  Dispense: 90 mL; Refill: 0  2. HTN (hypertension) controlled

## 2013-06-14 ENCOUNTER — Ambulatory Visit (INDEPENDENT_AMBULATORY_CARE_PROVIDER_SITE_OTHER): Payer: Medicare Other | Admitting: Internal Medicine

## 2013-06-14 VITALS — BP 156/76 | HR 69 | Temp 98.1°F | Resp 18 | Ht 64.5 in | Wt 207.2 lb

## 2013-06-14 DIAGNOSIS — L88 Pyoderma gangrenosum: Secondary | ICD-10-CM

## 2013-06-14 DIAGNOSIS — R21 Rash and other nonspecific skin eruption: Secondary | ICD-10-CM

## 2013-06-14 MED ORDER — MUPIROCIN 2 % EX OINT: TOPICAL_OINTMENT | CUTANEOUS | Status: DC

## 2013-06-14 NOTE — Progress Notes (Signed)
Subjective:     Patient ID: Julie Haas, female   DOB: Jul 01, 1925, 80 y.o.   MRN: [id number]  Rash Pertinent negatives include no congestion, cough, diarrhea, eye pain, fatigue, fever, rhinorrhea, shortness of breath, sore throat or vomiting.   80 YO caucasian female presents to [facility] with large skin lesion on her left lower extremity on the shin with weeping. She originally hit this left shin on a bus step back in november and was seen by Dr. Lorelei Pont with a wound the size of a nickel. She was then treated with Doxycycline BID x 10 days with improvement and scaling of the wound. Originally according to Dr. Rolly Salter nurse practitioner's note the wound had scabbed over and was almost resolved in early January but she was having calf tightness, pain to palpation, so a LLE U/S was ordered, which was negative. She still has calf pain but has said over the past 2 months the wound has been progressing in size. She also states she has been scrubbing the wound with soap and water and picking the larger scabbing areas off. She denies pain in the wound today, any fevers/chills/sweats, or any decrease in activity because of the wound. She states her left calf is swollen, tight, and painful but is unchanged since first initially noticing this in January. She also endorses a diffuse itchy rash that started this weekend and has been spreading. She hasn't taken any medicine for the rash and it has been spreading.    Review of Systems  Constitutional: Negative for fever, chills, diaphoresis, activity change, appetite change, fatigue and unexpected weight change.  HENT: Negative for congestion, ear pain, hearing loss, postnasal drip, rhinorrhea, sinus pressure and sore throat.   Eyes: Negative for pain, redness and itching.  Respiratory: Negative for cough, chest tightness, shortness of breath and wheezing.   Cardiovascular: Positive for leg swelling. Negative for chest pain and palpitations.  Gastrointestinal: Negative  for nausea, vomiting, abdominal pain, diarrhea, constipation and abdominal distention.  Genitourinary: Negative for frequency, decreased urine volume and difficulty urinating.  Musculoskeletal: Negative for arthralgias, gait problem, joint swelling and myalgias.  Skin: Positive for color change and rash.       Large wound on left anterior shin with weeping and skin sloughing  Allergic/Immunologic: Negative for environmental allergies.  Neurological: Negative for dizziness, syncope, weakness, light-headedness and headaches.  Psychiatric/Behavioral:       Still grieving over loss husband 2 y ago   Current Outpatient Prescriptions on File Prior to Visit  Medication Sig Dispense Refill  . Cholecalciferol (VITAMIN D PO) Take 1,000 Units by mouth daily. Take one capsule twice daily      . doxycycline (VIBRA-TABS) 100 MG tablet Take one twice daily for infection  20 tablet  0  . losartan (COZAAR) 50 MG tablet Take 1 tablet (50 mg total) by mouth daily.  30 tablet  6  . metoprolol tartrate (LOPRESSOR) 25 MG tablet TAKE 1 TABLET TWICE DAILY.  60 tablet  6  . dextromethorphan (DELSYM) 30 MG/5ML liquid One tsp every 8-12 hours as needed to control cough  90 mL  0   No current facility-administered medications on file prior to visit.       Objective:   Physical Exam  Constitutional: She is oriented to person, place, and time. She appears well-developed and well-nourished. No distress.  HENT:  Head: Normocephalic and atraumatic.  Right Ear: External ear normal.  Left Ear: External ear normal.  Mouth/Throat: Oropharynx is clear and moist.  Eyes: Conjunctivae are normal. Pupils are equal, round, and reactive to light.  Cardiovascular: Normal rate, regular rhythm, normal heart sounds and intact distal pulses.  Exam reveals no gallop and no friction rub.   No murmur heard. Pulmonary/Chest: Effort normal and breath sounds normal. No respiratory distress. She has no wheezes. She has no rales.   Abdominal: Soft. Bowel sounds are normal. She exhibits no distension. There is no tenderness. There is no rebound and no guarding.  Musculoskeletal: Normal range of motion.  4/5 LE strength bilaterally in flexion and extension, intact sensation,  Tenderness and swelling of left calf, circ 3cm greater than R mid calf--tense-tender-not red--no cord or defect homan's neg  Neurological: She is alert and oriented to person, place, and time. No cranial nerve deficit.  Skin: She is not diaphoretic.     erythematous necrotizing, weeping wound of the left anterior shin//only sl tender-- Fine diffuse maculopapular lesions, dry and rough, more heavily concentrated on her neck and chest but also on back and arms   Psychiatric: She has a normal mood and affect. Her behavior is normal. Thought content normal.   BP 156/76  Pulse 69  Temp(Src) 98.1 F (36.7 C) (Oral)  Resp 18  Ht 5' 4.5" (1.638 m)  Wt 207 lb 3.2 oz (93.985 kg)  BMI 35.03 kg/m2  SpO2 95%     Assessment:     Pyoderma gangrenosum  Culture sent Rash and nonspecific skin eruption-suggesting an ID reaction Swelling calf--? Edema 2 to pyoderma since this was present in jan and dopplers were negative        Plan:     Wound: dress wound with silvadene cream and dressing, send to wound care for further instructions and care as soon as possible//she can dress w/ bactroban at home Refer to wound care center Referral to dermatology to address rash component/2nd opinion     I have completed the patient encounter in its entirety as documented by the [facility], with editing by me where necessary. Shwanda Soltis P. Laney Pastor, M.D.

## 2013-06-17 LAB — WOUND CULTURE: GRAM STAIN: NONE SEEN

## 2013-06-20 ENCOUNTER — Ambulatory Visit (INDEPENDENT_AMBULATORY_CARE_PROVIDER_SITE_OTHER): Payer: Medicare Other | Admitting: Internal Medicine

## 2013-06-20 ENCOUNTER — Encounter: Payer: Self-pay | Admitting: Internal Medicine

## 2013-06-20 VITALS — BP 144/76 | HR 78 | Temp 97.7°F | Resp 18 | Ht 63.5 in | Wt 198.0 lb

## 2013-06-20 DIAGNOSIS — R21 Rash and other nonspecific skin eruption: Secondary | ICD-10-CM

## 2013-06-20 LAB — POCT SKIN KOH: Skin KOH, POC: NEGATIVE

## 2013-06-20 MED ORDER — CEPHALEXIN 500 MG PO CAPS: 500.0000 mg | ORAL_CAPSULE | Freq: Three times a day (TID) | ORAL | Status: DC

## 2013-06-20 NOTE — Progress Notes (Signed)
This chart was scribed for [employer]. Laney Pastor, MD by Marcha Dutton, ED Scribe. This patient was seen in room 11 and the patient's care was started at 1:59 PM.  Subjective:    Patient ID: Julie Haas, female    DOB: 17-Sep-1925, 80 y.o.   MRN: [id number]   HPI Chief Complaint  Patient presents with   Rash    follow up on rash all over body     HPI Comments: Julie Haas is a 80 y.o. female who presents to the [facility] for f/u of rash to LLE that began a little over 7 days ago and a wound to her left leg expanding since 01/2013. Pt notes much improvement to the LLE wound.  She was seen at [facility] 6 days ago 4/27 and prescribed protective bandage and bactroban ointment.  The skin rash is increasing however, with associated itching, espec on chest and back.   He is still awaiting appointment with dermatology and the wound care center Prior to Admission medications   Medication Sig Start Date End Date Taking? Authorizing Provider  Cholecalciferol (VITAMIN D PO) Take 1,000 Units by mouth daily. Take one capsule twice daily    Historical Provider, MD  dextromethorphan (DELSYM) 30 MG/5ML liquid One tsp every 8-12 hours as needed to control cough 05/18/13   Estill Dooms, MD  doxycycline (VIBRA-TABS) 100 MG tablet Take one twice daily for infection 05/18/13   Estill Dooms, MD  losartan (COZAAR) 50 MG tablet Take 1 tablet (50 mg total) by mouth daily. 01/01/13   Minus Breeding, MD  metoprolol tartrate (LOPRESSOR) 25 MG tablet TAKE 1 TABLET TWICE DAILY. 05/04/13   Minus Breeding, MD  mupirocin ointment Drue Stager) 2 % Apply ointment on wound after every dressing change 06/14/13   Leandrew Koyanagi, MD     Review of Systems  Musculoskeletal: Positive for myalgias.  Skin: Positive for rash.  no fever of leg pain.     Objective:   Physical Exam  Nursing note and vitals reviewed. Constitutional: She is oriented to person, place, and time. She appears  well-developed and well-nourished.  HENT:  Head: Normocephalic and atraumatic.  Eyes: Conjunctivae and EOM are normal. Pupils are equal, round, and reactive to light.  Neck: Neck supple.  Pulmonary/Chest: Effort normal.  Musculoskeletal:  The wound on the left anterior lower leg is now dry with scales but no active cellulitis. KOH scraping of this area is negative for hyphae.  Neurological: She is alert and oriented to person, place, and time. No cranial nerve deficit.  Skin: Skin is warm and dry. Rash noted. Rash is maculopapular.  The diffuse maculopapular lesions have increased in size and number over the chest and back and are red and scaly   Psychiatric: She has a normal mood and affect. Her behavior is normal.     Triage Vitals: BP 144/76   Pulse 78   Temp(Src) 97.7 F (36.5 C) (Oral)   Resp 18   Ht 5' 3.5" (1.613 m)   Wt 198 lb (89.812 kg)   BMI 34.52 kg/m2   SpO2 92%    Results for orders placed in visit on 06/20/13  POCT SKIN KOH      Result Value Ref Range   Skin KOH, POC from leg wound Negative          Assessment & Plan:   1. 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    Dispense:  30 capsule    Refill:  0   continue dressing the wound daily after cleaning with Bactroban and Ace wrap  Pt advised of plan for treatment and pt agrees.   I have completed the patient encounter in its entirety as documented by the scribe, with editing by me where necessary. Robert P. Laney Pastor, M.D.

## 2013-07-08 ENCOUNTER — Encounter (HOSPITAL_BASED_OUTPATIENT_CLINIC_OR_DEPARTMENT_OTHER): Payer: Medicare Other | Attending: Internal Medicine

## 2013-07-22 LAB — CULTURE, FUNGUS WITHOUT SMEAR

## 2013-07-30 ENCOUNTER — Other Ambulatory Visit: Payer: Self-pay | Admitting: Cardiology

## 2013-08-31 ENCOUNTER — Other Ambulatory Visit: Payer: Self-pay | Admitting: Cardiology

## 2013-09-14 ENCOUNTER — Ambulatory Visit (INDEPENDENT_AMBULATORY_CARE_PROVIDER_SITE_OTHER): Payer: Medicare Other | Admitting: Cardiology

## 2013-09-14 ENCOUNTER — Encounter: Payer: Self-pay | Admitting: Cardiology

## 2013-09-14 VITALS — BP 152/84 | HR 74 | Ht 65.0 in | Wt 199.0 lb

## 2013-09-14 DIAGNOSIS — I6529 Occlusion and stenosis of unspecified carotid artery: Secondary | ICD-10-CM

## 2013-09-14 DIAGNOSIS — I10 Essential (primary) hypertension: Secondary | ICD-10-CM

## 2013-09-14 NOTE — Progress Notes (Signed)
HPI The patient presents for follow up of HTN.  She had previous syncopal episode but has had no complaints of this for the last few visits. She did have a mechanical fall and injured her leg and has this wrap.  The patient denies any new symptoms such as chest discomfort, neck or arm discomfort. There has been no new shortness of breath, PND or orthopnea. There have been no reported palpitations, presyncope or syncope.  She does some activities and does some volunteer work.  Allergies  Allergen Reactions  . Codeine     Unknown  . Diuretic [Buchu-Cornsilk-Ch Grass-Hydran]     Unknown  . Lipitor [Atorvastatin Calcium]     Unknown  . Penicillins Other (See Comments)    unknown  . Pravachol     Unknown    Current Outpatient Prescriptions  Medication Sig Dispense Refill  . dextromethorphan (DELSYM) 30 MG/5ML liquid One tsp every 8-12 hours as needed to control cough  90 mL  0  . losartan (COZAAR) 50 MG tablet TAKE 1 TABLET ONCE DAILY.  30 tablet  0  . metoprolol tartrate (LOPRESSOR) 25 MG tablet TAKE 1 TABLET TWICE DAILY.  60 tablet  6   No current facility-administered medications for this visit.    Past Medical History  Diagnosis Date  . Hyperlipidemia   . Arrhythmia     PVC  . Retinal detachment   . Renal disorder   . Allergy   . Occlusion and stenosis of carotid artery without mention of cerebral infarction 03/24/2012  . Pneumonia, organism unspecified 11/192013  . Depressive disorder, not elsewhere classified 03/05/2011  . Other premature beats 03/05/2011  . Other seborrheic keratosis 11/20/2010  . Syncope and collapse 10/02/2010  . Palpitations 11/21/2009  . Unspecified essential hypertension 06/27/2009  . Cardiomegaly 06/27/2009  . Other generalized ischemic cerebrovascular disease 06/27/2009  . Dizziness and giddiness 06/27/2009  . Unspecified vitamin D deficiency 05/16/2009  . Family history of sudden cardiac death (SCD) 2008/11/30  . Rash and other nonspecific skin eruption  07/05/2008  . Edema 03/15/2008  . Obesity, unspecified 07/25/2006  . Varicose veins of lower extremities with inflammation 03/16/2003    Past Surgical History  Procedure Laterality Date  . Cholecystectomy  1983  . Cataract extraction Bilateral     Dr. [name]  . Tonsillectomy    . Keratosis    . Retinal detachment surgery Left     ROS:  As stated in the HPI and negative for all other systems.  PHYSICAL EXAM BP 152/84  Pulse 74  Ht 5\' 5"  (1.651 m)  Wt 199 lb (90.266 kg)  BMI 33.12 kg/m2 GENERAL:  Well appearing HEENT:  Pupils equal round and reactive, fundi not visualized, oral mucosa unremarkable NECK:  No jugular venous distention, waveform within normal limits, carotid upstroke brisk and symmetric, soft transmitted systolic murmur versus left carotid bruits, no thyromegaly LUNGS:  Clear to auscultation bilaterally LYMPHATICS:  Unremarkable BACK:  No CVA tenderness CHEST:  Unremarkable HEART:  PMI not displaced or sustained,S1 and S2 within normal limits, no S3, no S4, no clicks, no rubs, early peaking apical systolic murmur radiating out the aortic outflow tract, no diastolic murmurs ABD:  Flat, positive bowel sounds normal in frequency in pitch, no bruits, no rebound, no guarding, no midline pulsatile mass, no hepatomegaly, no splenomegaly EXT:  2 plus pulses throughout, no edema, no cyanosis no clubbing, left leg dressed   EKG:  Sinus rhythm, rate 75, axis within normal limits, intervals within normal  limits, no acute ST-T wave changes.  09/14/2013  ASSESSMENT AND PLAN   HTN (hypertension) -  Her BP is slightly elevated.  She will have this checked at home and let me know if it is trending upward.  It has been well controlled at previous appointments.  NSVT - She has had no symptomatic palpitations.  She will continue the beta blocker.   MURMUR - She has mild aortic sclerosis no further work up is suggested.   CAROTID STENOSIS - She has 60-79% bilateral stenosis with  a carotid Doppler done last in Feb.  I will repeat this study.    DYSLIPIDEMIA - She does not tolerate statins.  Her LDL was 148.  She will continue meds as listed.

## 2013-09-14 NOTE — Patient Instructions (Signed)
Your physician recommends that you schedule a follow-up appointment in: one year with Dr. Percival Spanish   We are going to get Carotid dopplers in one month

## 2013-09-24 ENCOUNTER — Inpatient Hospital Stay (HOSPITAL_COMMUNITY)
Admission: EM | Admit: 2013-09-24 | Discharge: 2013-10-02 | DRG: 308 | Disposition: A | Discharge status: Skilled Nursing Facility | Payer: Medicare Other | Attending: Cardiovascular Disease | Admitting: Cardiovascular Disease

## 2013-09-24 ENCOUNTER — Emergency Department (HOSPITAL_COMMUNITY): Payer: Medicare Other

## 2013-09-24 ENCOUNTER — Inpatient Hospital Stay (HOSPITAL_COMMUNITY): Admission: RE | Admit: 2013-09-24 | Payer: Medicare Other | Source: Ambulatory Visit

## 2013-09-24 ENCOUNTER — Encounter (HOSPITAL_COMMUNITY): Payer: Self-pay | Admitting: Emergency Medicine

## 2013-09-24 DIAGNOSIS — J189 Pneumonia, unspecified organism: Secondary | ICD-10-CM | POA: Diagnosis present

## 2013-09-24 DIAGNOSIS — I480 Paroxysmal atrial fibrillation: Secondary | ICD-10-CM

## 2013-09-24 DIAGNOSIS — J9600 Acute respiratory failure, unspecified whether with hypoxia or hypercapnia: Secondary | ICD-10-CM | POA: Diagnosis present

## 2013-09-24 DIAGNOSIS — E785 Hyperlipidemia, unspecified: Secondary | ICD-10-CM | POA: Diagnosis present

## 2013-09-24 DIAGNOSIS — I658 Occlusion and stenosis of other precerebral arteries: Secondary | ICD-10-CM | POA: Diagnosis present

## 2013-09-24 DIAGNOSIS — I10 Essential (primary) hypertension: Secondary | ICD-10-CM | POA: Diagnosis present

## 2013-09-24 DIAGNOSIS — I4891 Unspecified atrial fibrillation: Secondary | ICD-10-CM | POA: Diagnosis present

## 2013-09-24 DIAGNOSIS — Z8249 Family history of ischemic heart disease and other diseases of the circulatory system: Secondary | ICD-10-CM | POA: Diagnosis not present

## 2013-09-24 DIAGNOSIS — I6529 Occlusion and stenosis of unspecified carotid artery: Secondary | ICD-10-CM | POA: Diagnosis present

## 2013-09-24 DIAGNOSIS — W19XXXA Unspecified fall, initial encounter: Secondary | ICD-10-CM | POA: Diagnosis present

## 2013-09-24 DIAGNOSIS — I5031 Acute diastolic (congestive) heart failure: Secondary | ICD-10-CM | POA: Diagnosis present

## 2013-09-24 DIAGNOSIS — I509 Heart failure, unspecified: Secondary | ICD-10-CM | POA: Diagnosis present

## 2013-09-24 DIAGNOSIS — R0601 Orthopnea: Secondary | ICD-10-CM | POA: Diagnosis present

## 2013-09-24 DIAGNOSIS — Z9849 Cataract extraction status, unspecified eye: Secondary | ICD-10-CM

## 2013-09-24 DIAGNOSIS — S81802A Unspecified open wound, left lower leg, initial encounter: Secondary | ICD-10-CM

## 2013-09-24 DIAGNOSIS — I359 Nonrheumatic aortic valve disorder, unspecified: Secondary | ICD-10-CM

## 2013-09-24 DIAGNOSIS — Z66 Do not resuscitate: Secondary | ICD-10-CM | POA: Diagnosis present

## 2013-09-24 DIAGNOSIS — I350 Nonrheumatic aortic (valve) stenosis: Secondary | ICD-10-CM | POA: Diagnosis present

## 2013-09-24 DIAGNOSIS — Z79899 Other long term (current) drug therapy: Secondary | ICD-10-CM

## 2013-09-24 DIAGNOSIS — R0602 Shortness of breath: Secondary | ICD-10-CM

## 2013-09-24 LAB — CBC
HEMATOCRIT: 45.8 % (ref 36.0–46.0)
HEMOGLOBIN: 15.4 g/dL — AB (ref 12.0–15.0)
MCH: 32.3 pg (ref 26.0–34.0)
MCHC: 33.6 g/dL (ref 30.0–36.0)
MCV: 96 fL (ref 78.0–100.0)
Platelets: 257 10*3/uL (ref 150–400)
RBC: 4.77 MIL/uL (ref 3.87–5.11)
RDW: 14 % (ref 11.5–15.5)
WBC: 13.3 10*3/uL — ABNORMAL HIGH (ref 4.0–10.5)

## 2013-09-24 LAB — COMPREHENSIVE METABOLIC PANEL
ALK PHOS: 83 U/L (ref 39–117)
ALT: 39 U/L — AB (ref 0–35)
ANION GAP: 18 — AB (ref 5–15)
AST: 44 U/L — ABNORMAL HIGH (ref 0–37)
Albumin: 3.7 g/dL (ref 3.5–5.2)
BILIRUBIN TOTAL: 0.5 mg/dL (ref 0.3–1.2)
BUN: 15 mg/dL (ref 6–23)
CO2: 24 meq/L (ref 19–32)
Calcium: 8.7 mg/dL (ref 8.4–10.5)
Chloride: 94 mEq/L — ABNORMAL LOW (ref 96–112)
Creatinine, Ser: 0.53 mg/dL (ref 0.50–1.10)
GFR calc Af Amer: >90 mL/min (ref >90)
GFR, EST NON AFRICAN AMERICAN: 84 mL/min — AB (ref >90)
Glucose, Bld: 106 mg/dL — ABNORMAL HIGH (ref 70–99)
POTASSIUM: 4.3 meq/L (ref 3.7–5.3)
SODIUM: 136 meq/L — AB (ref 137–147)
TOTAL PROTEIN: 8 g/dL (ref 6.0–8.3)

## 2013-09-24 LAB — TSH: TSH: 2.58 u[IU]/mL (ref 0.350–4.500)

## 2013-09-24 LAB — PRO B NATRIURETIC PEPTIDE: Pro B Natriuretic peptide (BNP): 4829 pg/mL — ABNORMAL HIGH (ref 0–450)

## 2013-09-24 LAB — TROPONIN I: Troponin I: <0.3 ng/mL (ref <0.30)

## 2013-09-24 MED ORDER — SODIUM CHLORIDE 0.9 % IV SOLN
INTRAVENOUS | Status: DC
  Administered 2013-09-24: 02:00:00 via INTRAVENOUS
  Administered 2013-09-24: 10 mL/h via INTRAVENOUS
  Administered 2013-09-27: 11:00:00 via INTRAVENOUS

## 2013-09-24 MED ORDER — FUROSEMIDE 10 MG/ML IJ SOLN
20.0000 mg | Freq: Once | INTRAMUSCULAR | Status: AC
  Administered 2013-09-24: 20 mg via INTRAVENOUS
  Filled 2013-09-24: qty 2

## 2013-09-24 MED ORDER — DEXTROSE 5 % IV SOLN
5.0000 mg/h | INTRAVENOUS | Status: DC
  Administered 2013-09-24 – 2013-09-25 (×3): 15 mg/h via INTRAVENOUS
  Administered 2013-09-25 – 2013-09-27 (×6): 10 mg/h via INTRAVENOUS
  Administered 2013-09-28: 5 mg/h via INTRAVENOUS
  Filled 2013-09-24 (×15): qty 100

## 2013-09-24 MED ORDER — FUROSEMIDE 10 MG/ML IJ SOLN
40.0000 mg | Freq: Once | INTRAMUSCULAR | Status: AC
  Administered 2013-09-24: 40 mg via INTRAVENOUS
  Filled 2013-09-24: qty 4

## 2013-09-24 MED ORDER — DILTIAZEM HCL 100 MG IV SOLR: 20.0000 mg/h | Freq: Once | INTRAVENOUS | Status: AC

## 2013-09-24 MED ORDER — LOSARTAN POTASSIUM 50 MG PO TABS
50.0000 mg | ORAL_TABLET | Freq: Every day | ORAL | Status: DC
  Administered 2013-09-24 – 2013-10-02 (×9): 50 mg via ORAL
  Filled 2013-09-24 (×9): qty 1

## 2013-09-24 MED ORDER — DILTIAZEM HCL 25 MG/5ML IV SOLN
20.0000 mg | Freq: Once | INTRAVENOUS | Status: AC
  Administered 2013-09-24: 20 mg via INTRAVENOUS
  Filled 2013-09-24: qty 5

## 2013-09-24 MED ORDER — HEPARIN (PORCINE) IN NACL 100-0.45 UNIT/ML-% IJ SOLN
1000.0000 [IU]/h | INTRAMUSCULAR | Status: AC
  Administered 2013-09-24: 1000 [IU]/h via INTRAVENOUS
  Filled 2013-09-24: qty 250

## 2013-09-24 MED ORDER — VITAMIN E 45 MG (100 UNIT) PO CAPS
1000.0000 [IU] | ORAL_CAPSULE | Freq: Every day | ORAL | Status: DC
  Administered 2013-09-24 – 2013-10-02 (×8): 1000 [IU] via ORAL
  Filled 2013-09-24 (×9): qty 2

## 2013-09-24 MED ORDER — DILTIAZEM HCL 100 MG IV SOLR
10.0000 mg/h | Freq: Once | INTRAVENOUS | Status: AC
  Administered 2013-09-24: 10 mg/h via INTRAVENOUS

## 2013-09-24 MED ORDER — ACETAMINOPHEN 325 MG PO TABS: 650.0000 mg | ORAL_TABLET | ORAL | Status: DC | PRN

## 2013-09-24 MED ORDER — DEXTROSE 5 % IV SOLN
5.0000 mg/h | Freq: Once | INTRAVENOUS | Status: AC
  Administered 2013-09-24: 5 mg/h via INTRAVENOUS

## 2013-09-24 MED ORDER — APIXABAN 5 MG PO TABS
5.0000 mg | ORAL_TABLET | Freq: Two times a day (BID) | ORAL | Status: DC
  Administered 2013-09-24 – 2013-10-02 (×17): 5 mg via ORAL
  Filled 2013-09-24 (×18): qty 1

## 2013-09-24 MED ORDER — HEPARIN BOLUS VIA INFUSION
3000.0000 [IU] | Freq: Once | INTRAVENOUS | Status: AC
  Administered 2013-09-24: 3000 [IU] via INTRAVENOUS
  Filled 2013-09-24: qty 3000

## 2013-09-24 MED ORDER — METOPROLOL TARTRATE 25 MG PO TABS
25.0000 mg | ORAL_TABLET | Freq: Two times a day (BID) | ORAL | Status: DC
Start: 2013-09-24 — End: 2013-09-30
  Administered 2013-09-24 – 2013-09-29 (×12): 25 mg via ORAL
  Filled 2013-09-24 (×14): qty 1

## 2013-09-24 MED ORDER — ONDANSETRON HCL 4 MG/2ML IJ SOLN
4.0000 mg | Freq: Four times a day (QID) | INTRAMUSCULAR | Status: DC | PRN
  Filled 2013-09-24: qty 2

## 2013-09-24 NOTE — Care Management Note (Unsigned)
    Page 1 of 1   09/30/2013     4:32:27 PM CARE MANAGEMENT NOTE 09/30/2013  Patient:  Julie Haas, Julie Haas   Account Number:  [id number]  Date Initiated:  09/24/2013  Documentation initiated by:  Macaria Bias  Subjective/Objective Assessment:   Pt adm on 09/24/13 with Afib with RVR.  PTA, pt resides at [facility].     Action/Plan:   CSW consulted to facilitate return to [facility] when medically stable for dc.  New Eliquis pt...will check coverage for med.   Anticipated DC Date:  09/30/2013   Anticipated DC Plan:  SKILLED NURSING FACILITY  In-house referral  Clinical Social Worker      DC Planning Services  CM consult      Choice offered to / List presented to:             Status of service:  In process, will continue to follow Medicare Important Message given?  YES (If response is "NO", the following Medicare IM given date fields will be blank) Date Medicare IM given:  09/27/2013 Medicare IM given by:  Tashona Calk Date Additional Medicare IM given:  09/30/2013 Additional Medicare IM given by:  Ernesto Lashway  Discharge Disposition:    Per UR Regulation:  Reviewed for med. necessity/level of care/duration of stay  If discussed at [place] of Stay Meetings, dates discussed:    Comments:  09/29/13 Ellan Lambert, RN, BSN [phone] PT recommending SNF at dc; CSW following to facilitate dc to SNF at [facility] when medically stable for dc.  09/28/13 Ellan Lambert, RN, BSN [phone] Checked with pt's pharmacy, [facility] [address] regarding Eliquis copay.  They ran dummy Rx for pt's Eliquis, copay will be $20, per phamacist.  09/24/13 Ellan Lambert, RN, BSN [phone] Attempting to check benefits for Eliquis, but computer system down for pt's Rx insurance all afternoon.

## 2013-09-24 NOTE — Evaluation (Signed)
Physical Therapy Evaluation Patient Details Name: Julie Haas MRN: [id number] DOB: 1925-04-29 Today's Date: 09/24/2013   History of Present Illness  Pt adm on 09/24/13 with Afib with RVR.   Clinical Impression  Pt admitted with above. Pt currently with functional limitations due to the deficits listed below (see PT Problem List).  Pt will benefit from skilled PT to increase their independence and safety with mobility to allow discharge back to [facility]. Pt on 4L of O2.      Follow Up Recommendations Home health PT    Equipment Recommendations  None recommended by PT    Recommendations for Other Services       Precautions / Restrictions Precautions Precautions: Fall      Mobility  Bed Mobility Overal bed mobility: Needs Assistance Bed Mobility: Supine to Sit     Supine to sit: Mod assist        Transfers Overall transfer level: Needs assistance Equipment used: Rolling walker (2 wheeled) Transfers: Sit to/from Stand Sit to Stand: Min guard         General transfer comment: verbal cues for hand placement  Ambulation/Gait Ambulation/Gait assistance: Min guard Ambulation Distance (Feet): 130 Feet Assistive device: Rolling walker (2 wheeled) Gait Pattern/deviations: Step-through pattern;Decreased step length - right;Decreased step length - left;Trunk flexed Gait velocity: decr Gait velocity interpretation: Below normal speed for age/gender General Gait Details: Verbal cues to keep feet inside base of walker especially on turns.  Stairs            Wheelchair Mobility    Modified Rankin (Stroke Patients Only)       Balance Overall balance assessment: Needs assistance         Standing balance support: Bilateral upper extremity supported Standing balance-Leahy Scale: Poor Standing balance comment: Walker for support                             Pertinent Vitals/Pain Pain Assessment: No/denies pain    Home Living  Family/patient expects to be discharged to::  [facility]) Living Arrangements: Alone   Type of Home: Apartment Home Access: Level entry     Home Layout: One level Home Equipment: Environmental consultant - 4 wheels;Electric scooter;Grab bars - toilet;Grab bars - tub/shower      Prior Function Level of Independence: Independent with assistive device(s)         Comments: Can obtain more assist from [facility] if needed.     Hand Dominance        Extremity/Trunk Assessment   Upper Extremity Assessment: Defer to OT evaluation           Lower Extremity Assessment: Generalized weakness         Communication   Communication: No difficulties  Cognition Arousal/Alertness: Awake/alert Behavior During Therapy: WFL for tasks assessed/performed Overall Cognitive Status: Within Functional Limits for tasks assessed                      General Comments      Exercises        Assessment/Plan    PT Assessment Patient needs continued PT services  PT Diagnosis Difficulty walking;Generalized weakness   PT Problem List Decreased strength;Decreased activity tolerance;Decreased balance;Decreased mobility;Decreased knowledge of use of DME  PT Treatment Interventions DME instruction;Gait training;Functional mobility training;Therapeutic activities;Therapeutic exercise;Balance training;Patient/family education   PT Goals (Current goals can be found in the Care Plan section) Acute Rehab PT Goals  Patient Stated Goal: to return home PT Goal Formulation: With patient Time For Goal Achievement: 10/01/13 Potential to Achieve Goals: Good    Frequency Min 3X/week   Barriers to discharge        Co-evaluation               End of Session Equipment Utilized During Treatment: Oxygen Activity Tolerance: Patient limited by fatigue Patient left: in chair;with call bell/phone within reach Nurse Communication: Mobility status         Time: 0092-3300 PT Time Calculation  (min): 16 min   Charges:   PT Evaluation $Initial PT Evaluation Tier I: 1 Procedure PT Treatments $Gait Training: 8-22 mins   PT G Codes:          Johnie Stadel 2013/10/05, 4:07 PM  [employer] PT [phone]

## 2013-09-24 NOTE — Progress Notes (Signed)
ANTICOAGULATION CONSULT NOTE - Initial Consult  Pharmacy Consult for Heparin Indication: atrial fibrillation  Allergies  Allergen Reactions  . Codeine     Unknown  . Diuretic [Buchu-Cornsilk-Ch Grass-Hydran]     Unknown  . Lipitor [Atorvastatin Calcium]     Unknown  . Penicillins Other (See Comments)    unknown  . Pravachol     Unknown    Patient Measurements: Height: 5' 4.96" (165 cm) Weight: 199 lb 1.2 oz (90.3 kg) IBW/kg (Calculated) : 56.91 Heparin Dosing Weight: 75 kg  Vital Signs: Temp: 98.2 F (36.8 C) (08/07 0130) Temp src: Oral (08/07 0130) BP: 117/71 mmHg (08/07 0301) Pulse Rate: 102 (08/07 0301)  Labs:  Recent Labs  09/24/13 0200 09/24/13 0248  HGB 15.4*  --   HCT 45.8  --   PLT 257  --   CREATININE 0.53  --   TROPONINI  --  <0.30    Estimated Creatinine Clearance: 57.1 ml/min (by C-G formula based on Cr of 0.53).   Medical History: Past Medical History  Diagnosis Date  . Hyperlipidemia   . Arrhythmia     PVC  . Retinal detachment   . Renal disorder   . Allergy   . Occlusion and stenosis of carotid artery without mention of cerebral infarction 03/24/2012  . Pneumonia, organism unspecified 11/192013  . Depressive disorder, not elsewhere classified 03/05/2011  . Other premature beats 03/05/2011  . Other seborrheic keratosis 11/20/2010  . Syncope and collapse 10/02/2010  . Palpitations 11/21/2009  . Unspecified essential hypertension 06/27/2009  . Cardiomegaly 06/27/2009  . Other generalized ischemic cerebrovascular disease 06/27/2009  . Dizziness and giddiness 06/27/2009  . Unspecified vitamin D deficiency 05/16/2009  . Family history of sudden cardiac death (SCD) 2008/12/16  . Rash and other nonspecific skin eruption 07/05/2008  . Edema 03/15/2008  . Obesity, unspecified 07/25/2006  . Varicose veins of lower extremities with inflammation 03/16/2003    Medications:  Cozaar  Lopressor  Vit E  Assessment: 80 yo female with new onset Afib for  heparin+  Goal of Therapy:  Heparin level 0.3-0.7 units/ml Monitor platelets by anticoagulation protocol: Yes   Plan:  Heparin 3000 units IV bolus, then 1000 units/hr Check heparin level in 8 hours.   Caryl Pina 09/24/2013,5:11 AM

## 2013-09-24 NOTE — ED Notes (Signed)
Pt present to ED via EMS with SOB associated with diminished lungs sounds. Per EMS, ECG show a-fib with HR-150-190, given 10mg  Cardezim on route, initial SpO2-87% on room air. Pt also was given 5mg  albuterol. Pt alerts and oriented x4 at arrival.

## 2013-09-24 NOTE — Progress Notes (Signed)
  Echocardiogram 2D Echocardiogram has been performed.  Julie Haas 09/24/2013, 5:41 PM

## 2013-09-24 NOTE — Discharge Instructions (Signed)
Information on my medicine - ELIQUIS (apixaban)  This medication education was reviewed with me or my healthcare representative as part of my discharge preparation.  The pharmacist that spoke with me during my hospital stay was:  Earleen Newport, [facility]  Why was Eliquis prescribed for you? Eliquis was prescribed for you to reduce the risk of a blood clot forming that can cause a stroke if you have a medical condition called atrial fibrillation (a type of irregular heartbeat).  What do You need to know about Eliquis ? Take your Eliquis TWICE DAILY - one tablet in the morning and one tablet in the evening with or without food. If you have difficulty swallowing the tablet whole please discuss with your pharmacist how to take the medication safely.  Take Eliquis exactly as prescribed by your doctor and DO NOT stop taking Eliquis without talking to the doctor who prescribed the medication.  Stopping may increase your risk of developing a stroke.  Refill your prescription before you run out.  After discharge, you should have regular check-up appointments with your healthcare provider that is prescribing your Eliquis.  In the future your dose may need to be changed if your kidney function or weight changes by a significant amount or as you get older.  What do you do if you miss a dose? If you miss a dose, take it as soon as you remember on the same day and resume taking twice daily.  Do not take more than one dose of ELIQUIS at the same time to make up a missed dose.  Important Safety Information A possible side effect of Eliquis is bleeding. You should call your healthcare provider right away if you experience any of the following:   Bleeding from an injury or your nose that does not stop.   Unusual colored urine (red or dark brown) or unusual colored stools (red or black).   Unusual bruising for unknown reasons.   A serious fall or if you hit your head (even if there is no  bleeding).  Some medicines may interact with Eliquis and might increase your risk of bleeding or clotting while on Eliquis. To help avoid this, consult your healthcare provider or pharmacist prior to using any new prescription or non-prescription medications, including herbals, vitamins, non-steroidal anti-inflammatory drugs (NSAIDs) and supplements.  This website has more information on Eliquis (apixaban): http://www.eliquis.com/eliquis/home

## 2013-09-24 NOTE — Progress Notes (Signed)
  TEE/DCCV is scheduled for 1200hrs on Monday, Aug. 10.  Rodel Glaspy, PA-C

## 2013-09-24 NOTE — Evaluation (Signed)
Occupational Therapy Evaluation Patient Details Name: Julie Haas MRN: [id number] DOB: August 12, 1925 Today's Date: 09/24/2013    History of Present Illness Pt adm on 09/24/13 with Afib with RVR.    Clinical Impression   Pt admitted with above. Will benefit from acute OT services to address below problem list. Recommending HHOT vs. None pending progress.     Follow Up Recommendations  Home health OT;Supervision - Intermittent    Equipment Recommendations  None recommended by OT    Recommendations for Other Services       Precautions / Restrictions        Mobility Bed Mobility Overal bed mobility: Needs Assistance Bed Mobility: Supine to Sit     Supine to sit: Mod assist        Transfers Overall transfer level: Needs assistance Equipment used: Rolling walker (2 wheeled) Transfers: Sit to/from Stand Sit to Stand: Min guard              Balance                                            ADL Overall ADL's : Needs assistance/impaired Eating/Feeding: Supervision/ safety;Sitting   Grooming: Brushing hair;Wash/dry hands;Min guard;Standing           Upper Body Dressing : Supervision/safety;Sitting       Toilet Transfer: Min guard;Ambulation;Comfort height toilet;RW   Toileting- Water quality scientist and Hygiene: Min guard;Sit to/from stand       Functional mobility during ADLs: Min guard;Rolling walker General ADL Comments: 3/4 dyspnea after completing bed mobility tasks and with conversation.  Cues for deep breathing.     Vision                     Perception     Praxis      Pertinent Vitals/Pain       Hand Dominance     Extremity/Trunk Assessment Upper Extremity Assessment Upper Extremity Assessment: Overall WFL for tasks assessed           Communication Communication Communication: No difficulties   Cognition Arousal/Alertness: Awake/alert Behavior During Therapy: WFL for tasks  assessed/performed Overall Cognitive Status: Within Functional Limits for tasks assessed                     General Comments       Exercises       Shoulder Instructions      Home Living Family/patient expects to be discharged to::  ([facility]) Living Arrangements: Alone   Type of Home: Apartment Home Access: Level entry     Home Layout: One level     Bathroom Shower/Tub: Occupational psychologist: Handicapped height     Home Equipment: Environmental consultant - 4 wheels;Electric scooter;Grab bars - toilet;Grab bars - tub/shower          Prior Functioning/Environment Level of Independence: Independent with assistive device(s)        Comments: Can obtain more assist from [facility] if needed.    OT Diagnosis: Generalized weakness   OT Problem List: Decreased strength;Decreased activity tolerance;Decreased knowledge of use of DME or AE;Cardiopulmonary status limiting activity   OT Treatment/Interventions: Self-care/ADL training;Energy conservation;DME and/or AE instruction;Therapeutic activities;Patient/family education    OT Goals(Current goals can be found in the care plan section) Acute Rehab OT Goals Patient Stated Goal: to return home  OT Goal Formulation: With patient Time For Goal Achievement: 10/01/13 Potential to Achieve Goals: Good  OT Frequency: Min 2X/week   Barriers to D/C:            Co-evaluation              End of Session Equipment Utilized During Treatment: Gait belt;Rolling walker;Oxygen Nurse Communication: Mobility status  Activity Tolerance: Patient tolerated treatment well Patient left:  (ambulating with PT)   Time: 5726-2035 OT Time Calculation (min): 27 min Charges:  OT General Charges $OT Visit: 1 Procedure OT Evaluation $Initial OT Evaluation Tier I: 1 Procedure OT Treatments $Self Care/Home Management : 8-22 mins G-Codes:    Darrol Jump 09/24/2013, 3:50 PM   09/24/2013 Darrol Jump OTR/L Pager [phone] Office [phone]

## 2013-09-24 NOTE — ED Provider Notes (Signed)
CSN: [id number]     Arrival date & time 09/24/13  0125 History   First MD Initiated Contact with Patient 09/24/13 0135     Chief Complaint  Patient presents with  . Shortness of Breath     (Consider location/radiation/quality/duration/timing/severity/associated sxs/prior Treatment) Patient is a 80 y.o. female presenting with shortness of breath. The history is provided by the patient.  Shortness of Breath Associated symptoms: no abdominal pain, no chest pain, no fever, no headaches, no neck pain, no rash, no sore throat and no vomiting   pt c/o sob, orthopnea tonight.  ems found pt in afib w rvr rate 150-180.  Pt denies hx dysrhythmia, has had brief palpitations in past. No syncope. Pt denies chest pain or discomfort. Pt unaware of rapid heart beat, is not feeling palpitations, as such, unclear when onset.   States compliant w normal meds. No recent changes. No recent febrile illness. States earlier today felt in normal state of health.     Past Medical History  Diagnosis Date  . Hyperlipidemia   . Arrhythmia     PVC  . Retinal detachment   . Renal disorder   . Allergy   . Occlusion and stenosis of carotid artery without mention of cerebral infarction 03/24/2012  . Pneumonia, organism unspecified 11/192013  . Depressive disorder, not elsewhere classified 03/05/2011  . Other premature beats 03/05/2011  . Other seborrheic keratosis 11/20/2010  . Syncope and collapse 10/02/2010  . Palpitations 11/21/2009  . Unspecified essential hypertension 06/27/2009  . Cardiomegaly 06/27/2009  . Other generalized ischemic cerebrovascular disease 06/27/2009  . Dizziness and giddiness 06/27/2009  . Unspecified vitamin D deficiency 05/16/2009  . Family history of sudden cardiac death (SCD) Dec 28, 2008  . Rash and other nonspecific skin eruption 07/05/2008  . Edema 03/15/2008  . Obesity, unspecified 07/25/2006  . Varicose veins of lower extremities with inflammation 03/16/2003   Past Surgical History  Procedure  Laterality Date  . Cholecystectomy  1983  . Cataract extraction Bilateral     Dr. [name]  . Tonsillectomy    . Keratosis    . Retinal detachment surgery Left    Family History  Problem Relation Age of Onset  . Arrhythmia      Long QT in first degree relatives,  . Heart attack Father   . Heart disease Father   . Heart attack Brother   . Heart disease Brother   . Pneumonia Mother    History  Substance Use Topics  . Smoking status: Never Smoker   . Smokeless tobacco: Never Used  . Alcohol Use: No   OB History   Grav Para Term Preterm Abortions TAB SAB Ect Mult Living                 Review of Systems  Constitutional: Negative for fever and chills.  HENT: Negative for sore throat.   Eyes: Negative for redness.  Respiratory: Positive for shortness of breath.   Cardiovascular: Negative for chest pain and leg swelling.  Gastrointestinal: Negative for vomiting, abdominal pain and diarrhea.  Genitourinary: Negative for flank pain.  Musculoskeletal: Negative for back pain and neck pain.  Skin: Negative for rash.  Neurological: Negative for headaches.  Hematological: Does not bruise/bleed easily.  Psychiatric/Behavioral: Negative for confusion.      Allergies  Codeine; Diuretic; Lipitor; Penicillins; and Pravachol  Home Medications   Prior to Admission medications   Medication Sig Start Date End Date Taking? Authorizing Provider  dextromethorphan (DELSYM) 30 MG/5ML liquid One tsp every  8-12 hours as needed to control cough 05/18/13   Estill Dooms, MD  losartan (COZAAR) 50 MG tablet TAKE 1 TABLET ONCE DAILY.    Minus Breeding, MD  metoprolol tartrate (LOPRESSOR) 25 MG tablet TAKE 1 TABLET TWICE DAILY. 05/04/13   Minus Breeding, MD   There were no vitals taken for this visit. Physical Exam  Nursing note and vitals reviewed. Constitutional: She is oriented to person, place, and time. She appears well-developed and well-nourished.  Tachycardic.   HENT:  Mouth/Throat:  Oropharynx is clear and moist.  Eyes: Conjunctivae are normal. No scleral icterus.  Neck: Neck supple. No tracheal deviation present. No thyromegaly present.  Cardiovascular: Normal heart sounds and intact distal pulses.   Tachycardic, irregular rhythm  Pulmonary/Chest: Effort normal and breath sounds normal. No respiratory distress.  Abdominal: Soft. Normal appearance and bowel sounds are normal. She exhibits no distension. There is no tenderness.  Genitourinary:  No cva tenderness  Musculoskeletal: She exhibits no edema and no tenderness.  Neurological: She is alert and oriented to person, place, and time.  Skin: Skin is warm and dry. No rash noted. She is not diaphoretic.  Psychiatric: She has a normal mood and affect.    ED Course  Procedures (including critical care time) Labs Review   Results for orders placed during the hospital encounter of 09/24/13  CBC      Result Value Ref Range   WBC 13.3 (*) 4.0 - 10.5 K/uL   RBC 4.77  3.87 - 5.11 MIL/uL   Hemoglobin 15.4 (*) 12.0 - 15.0 g/dL   HCT 45.8  36.0 - 46.0 %   MCV 96.0  78.0 - 100.0 fL   MCH 32.3  26.0 - 34.0 pg   MCHC 33.6  30.0 - 36.0 g/dL   RDW 14.0  11.5 - 15.5 %   Platelets 257  150 - 400 K/uL  COMPREHENSIVE METABOLIC PANEL      Result Value Ref Range   Sodium 136 (*) 137 - 147 mEq/L   Potassium 4.3  3.7 - 5.3 mEq/L   Chloride 94 (*) 96 - 112 mEq/L   CO2 24  19 - 32 mEq/L   Glucose, Bld 106 (*) 70 - 99 mg/dL   BUN 15  6 - 23 mg/dL   Creatinine, Ser 0.53  0.50 - 1.10 mg/dL   Calcium 8.7  8.4 - 10.5 mg/dL   Total Protein 8.0  6.0 - 8.3 g/dL   Albumin 3.7  3.5 - 5.2 g/dL   AST 44 (*) 0 - 37 U/L   ALT 39 (*) 0 - 35 U/L   Alkaline Phosphatase 83  39 - 117 U/L   Total Bilirubin 0.5  0.3 - 1.2 mg/dL   GFR calc non Af Amer 84 (*) >90 mL/min   GFR calc Af Amer >90  >90 mL/min   Anion gap 18 (*) 5 - 15  TROPONIN I      Result Value Ref Range   Troponin I <0.30  <0.30 ng/mL  PRO B NATRIURETIC PEPTIDE      Result  Value Ref Range   Pro B Natriuretic peptide (BNP) 4829.0 (*) 0 - 450 pg/mL   Dg Chest Port 1 View  09/24/2013   CLINICAL DATA:  Shortness of breath.  EXAM: PORTABLE CHEST - 1 VIEW  COMPARISON:  01/10/2012.  FINDINGS: There is chronic cardiomegaly. Aortic atherosclerosis. No acute aortic contour abnormality.  There is diffuse interstitial coarsening which is increased from previous. Chronic patchy opacities  in the bilateral lungs, most confluent in the right upper and right lower chest. Based on chest CT 01/23/2009, these are likely postinfectious scarring. No definitive effusion. No pneumothorax.  IMPRESSION: When accounting for chronic lung scarring (likely postinfectious), no definite edema or pneumonia.   Electronically Signed   By: Jorje Guild M.D.   On: 09/24/2013 02:11      EKG Interpretation   Date/Time:  Friday September 24 2013 01:37:02 EDT Ventricular Rate:  153 PR Interval:    QRS Duration: 102 QT Interval:  301 QTC Calculation: 480 R Axis:   15 Text Interpretation:  Atrial fibrillation with rapid V-rate Ventricular  premature complex Repolarization abnormality, prob rate related  Non-specific ST-t changes Confirmed by Ashok Cordia  MD, Lennette Bihari ([id number]) on  09/24/2013 1:39:58 AM      MDM   Iv ns. Continuous pulse ox and monitor. o2 [place].   cardizem iv bolus and drip.  Reviewed nursing notes and prior charts for additional history.   Recheck hr back to 150, afib.  rebolus cardizem 20 iv, increased gtt to 10.  Recheck pt no cp.  Hr improved 114, afib.  Cardiology consulted re admission new onset rapid afib.  CRITICAL CARE  RE new onset atrial fibrillation with rapid ventricular response, severe tachycardia, dyspnea, chf.  Performed by: Mirna Mires Total critical care time: 35 Critical care time was exclusive of separately billable procedures and treating other patients. Critical care was necessary to treat or prevent imminent or life-threatening deterioration. Critical  care was time spent personally by me on the following activities: development of treatment plan with patient and/or surrogate as well as nursing, discussions with consultants, evaluation of patient's response to treatment, examination of patient, obtaining history from patient or surrogate, ordering and performing treatments and interventions, ordering and review of laboratory studies, ordering and review of radiographic studies, pulse oximetry and re-evaluation of patient's condition.      Mirna Mires, MD 09/24/13 [phone]

## 2013-09-24 NOTE — H&P (Signed)
Patient ID: Julie Haas MRN: [id number], DOB/AGE: Aug 31, 1925   Admit date: 09/24/2013   Primary Physician: Estill Dooms, MD Primary Cardiologist: Hochrein  CC: Shortness of breath  Problem List  Past Medical History  Diagnosis Date  . Hyperlipidemia   . Arrhythmia     PVC  . Retinal detachment   . Renal disorder   . Allergy   . Occlusion and stenosis of carotid artery without mention of cerebral infarction 03/24/2012  . Pneumonia, organism unspecified 11/192013  . Depressive disorder, not elsewhere classified 03/05/2011  . Other premature beats 03/05/2011  . Other seborrheic keratosis 11/20/2010  . Syncope and collapse 10/02/2010  . Palpitations 11/21/2009  . Unspecified essential hypertension 06/27/2009  . Cardiomegaly 06/27/2009  . Other generalized ischemic cerebrovascular disease 06/27/2009  . Dizziness and giddiness 06/27/2009  . Unspecified vitamin D deficiency 05/16/2009  . Family history of sudden cardiac death (SCD) 12/27/08  . Rash and other nonspecific skin eruption 07/05/2008  . Edema 03/15/2008  . Obesity, unspecified 07/25/2006  . Varicose veins of lower extremities with inflammation 03/16/2003    Past Surgical History  Procedure Laterality Date  . Cholecystectomy  1983  . Cataract extraction Bilateral     Dr. [name]  . Tonsillectomy    . Keratosis    . Retinal detachment surgery Left      Allergies  Allergies  Allergen Reactions  . Codeine     Unknown  . Diuretic [Buchu-Cornsilk-Ch Grass-Hydran]     Unknown  . Lipitor [Atorvastatin Calcium]     Unknown  . Penicillins Other (See Comments)    unknown  . Pravachol     Unknown    HPI The patient is a pleasant 54F with a history of HTN who presents with worsening shortness of breath. She was in her usual state of health earlier on 09/23/2013 but noted in the evening that she had difficulty breathing when lying down. Of note, she had noticed that her heart rate had been irregular intermittently  through the course of the day.  Although she has had prior syncopal episodes, she has never had SOB like today. She is overall very active with clubs and her church groups. She had a prior history of PVCs but no confirmed AF per her recollection.  EMS was called given her symptoms and they noted that she was in AF with rates in the >150 range. She was given diltiazem en route. In the ED, she was given additional diltiazem and placed on an infusion with better control of her HR in the 110s-120s. Cardiology was consulted for additional evaluation and management of her AF-RVR.   Home Medications  Prior to Admission medications   Medication Sig Start Date End Date Taking? Authorizing Provider  losartan (COZAAR) 50 MG tablet Take 50 mg by mouth daily.    Historical Provider, MD  metoprolol tartrate (LOPRESSOR) 25 MG tablet Take 25 mg by mouth 2 (two) times daily.    Historical Provider, MD  vitamin E 1000 UNIT capsule Take 1,000 Units by mouth daily.    Historical Provider, MD    Family History  Family History  Problem Relation Age of Onset  . Arrhythmia      Long QT in first degree relatives,  . Heart attack Father   . Heart disease Father   . Heart attack Brother   . Heart disease Brother   . Pneumonia Mother     Social History  History   Social History  .  Marital Status: Widowed    Spouse Name: N/A    Number of Children: N/A  . Years of Education: N/A   Occupational History  . Housewife    Social History Main Topics  . Smoking status: Never Smoker   . Smokeless tobacco: Never Used  . Alcohol Use: No  . Drug Use: No  . Sexual Activity: No   Other Topics Concern  . Not on file   Social History Narrative   The patient is widowed, husband died 03-02-2011.  She has two children and two     grandchildren.     She never smoked cigarettes    Does not drink alcohol.    Drinks 2 cups caffeine coffee,    Lives at [facility] since 09/03/2004   Living Will   Exercise:  walks daily           Review of Systems General:  No chills, fever, night sweats or weight changes.  Cardiovascular:  No chest pain. +orthopnea, DOE, PND. Dermatological: No rash, lesions/masses Respiratory: No cough, dyspnea Urologic: No hematuria, dysuria Abdominal:   No nausea, vomiting, diarrhea, bright red blood per rectum, melena, or hematemesis Neurologic:  No visual changes, wkns, changes in mental status. All other systems reviewed and are otherwise negative except as noted above.  Physical Exam  Blood pressure 117/71, pulse 102, temperature 98.2 F (36.8 C), temperature source Oral, resp. rate 28, SpO2 90.00%.  General: Pleasant, NAD Psych: Normal affect. Neuro: Alert and oriented X 3. Moves all extremities spontaneously. HEENT: Normal  Neck: Supple without bruits. JVD elevated to 10cm water Lungs:  Tachypneic, bibasilar rales L>R Heart: tachy, IRIR no s3, s4, or murmurs. Abdomen: Soft, non-tender, non-distended, BS + x 4.  Extremities: Left leg is wrapped. Tr-1+ edema RLE  Labs  Troponin (Point of Care Test) No results found for this basename: TROPIPOC,  in the last 72 hours  Recent Labs  09/24/13 0248  TROPONINI <0.30   Lab Results  Component Value Date   WBC 13.3* 09/24/2013   HGB 15.4* 09/24/2013   HCT 45.8 09/24/2013   MCV 96.0 09/24/2013   PLT 257 09/24/2013    Recent Labs Lab 09/24/13 0200  NA 136*  K 4.3  CL 94*  CO2 24  BUN 15  CREATININE 0.53  CALCIUM 8.7  PROT 8.0  BILITOT 0.5  ALKPHOS 83  ALT 39*  AST 44*  GLUCOSE 106*   Lab Results  Component Value Date   CHOL 229* 03/22/2013   HDL 63 03/22/2013   LDLCALC 148 03/22/2013   TRIG 92 03/22/2013   No results found for this basename: DDIMER     Radiology/Studies  Dg Chest Port 1 View  09/24/2013   CLINICAL DATA:  Shortness of breath.  EXAM: PORTABLE CHEST - 1 VIEW  COMPARISON:  01/10/2012.  FINDINGS: There is chronic cardiomegaly. Aortic atherosclerosis. No acute aortic contour abnormality.   There is diffuse interstitial coarsening which is increased from previous. Chronic patchy opacities in the bilateral lungs, most confluent in the right upper and right lower chest. Based on chest CT 01/23/2009, these are likely postinfectious scarring. No definitive effusion. No pneumothorax.  IMPRESSION: When accounting for chronic lung scarring (likely postinfectious), no definite edema or pneumonia.   Electronically Signed   By: Jorje Guild M.D.   On: 09/24/2013 02:11    ECG 150bpm consistent with AF-RVR  ASSESSMENT AND PLAN 1. AF-RVR 2. Hypertension 3. Heart failure symptoms  The patient is a pleasant 25F  with a history of HTN who presents with worsening shortness of breath. ECG revealed rapid AF. She is currently rate controlled with diltiazem.We had a long conversation regarding rate versus rhythm control and then stroke ppx. At this point we will continue aggressive rate control with diltiazem gtt. However, given that this the first instance of documented AF for her, it is reasonable to consider rhythm control as well. Re: Stroke ppx, she is CHADS-VASC = 5 (Age x2, HTN, vascular disease from carotid stenosis, female sex), placing her at moderate-high risk of stroke. We will start her on heparin during the hospitalization but she may be a good candidate for a novel agent. I would consider apixaban given her age. -Continue diltiazem infusion for now -continue home metoprolol, though may need to be careful long term re: dual AV nodal blcokers -TTE for functional assessment -heparin gtt for now, consider apixaban or other novel agent -Lasix 20mg  IV for fluid overload, will continue to monitor.  FULL CODE  Signed Raliegh Ip, MD MPH 09/24/2013, 4:52 AM

## 2013-09-24 NOTE — Progress Notes (Addendum)
Subjective: SOB, Orthopnea  Objective: Vital signs in last 24 hours: Temp:  [98.2 F (36.8 C)-98.4 F (36.9 C)] 98.4 F (36.9 C) (08/07 0637) Pulse Rate:  [68-139] 90 (08/07 0637) Resp:  [21-35] 30 (08/07 0637) BP: (107-140)/(55-92) 134/80 mmHg (08/07 0637) SpO2:  [85 %-96 %] 95 % (08/07 0637) Weight:  [199 lb 1.2 oz (90.3 kg)-210 lb 5.1 oz (95.4 kg)] 210 lb 5.1 oz (95.4 kg) (08/07 0637)    Intake/Output from previous day:   Intake/Output this shift:    Medications Current Facility-Administered Medications  Medication Dose Route Frequency Provider Last Rate Last Dose  . 0.9 %  sodium chloride infusion   Intravenous Continuous Mirna Mires, MD 20 mL/hr at 09/24/13 0157    . acetaminophen (TYLENOL) tablet 650 mg  650 mg Oral Q4H PRN Raliegh Ip, MD      . diltiazem (CARDIZEM) 100 mg in dextrose 5 % 100 mL (1 mg/mL) infusion  5-15 mg/hr Intravenous Titrated Raliegh Ip, MD      . heparin ADULT infusion 100 units/mL (25000 units/250 mL)  1,000 Units/hr Intravenous Continuous Herminio Commons, MD 10 mL/hr at 09/24/13 0550 1,000 Units/hr at 09/24/13 0550  . losartan (COZAAR) tablet 50 mg  50 mg Oral Daily Raliegh Ip, MD      . metoprolol tartrate (LOPRESSOR) tablet 25 mg  25 mg Oral BID Raliegh Ip, MD      . ondansetron (ZOFRAN) injection 4 mg  4 mg Intravenous Q6H PRN Raliegh Ip, MD      . vitamin E capsule 1,000 Units  1,000 Units Oral Daily Raliegh Ip, MD        PE: General appearance: alert, cooperative and no distress Lungs: Bilateral rales. Heart: irregularly irregular rhythm Abdomen: Nontender, +BS.  Appears tense. Extremities: No LEE Pulses: 2+ and symmetric Skin: Warm and dry Neurologic: Grossly normal  Lab Results:   Recent Labs  09/24/13 0200  WBC 13.3*  HGB 15.4*  HCT 45.8  PLT 257   BMET  Recent Labs  09/24/13 0200  NA 136*  K 4.3  CL 94*  CO2 24  GLUCOSE 106*  BUN 15  CREATININE 0.53  CALCIUM 8.7     Assessment/Plan     Active Problems:   Atrial fibrillation   HTN   Acute CHF  Plan:   Still in afib. Rate 110's.   IV cardizem.  Will increase to 15mg /hr.  Lopressor 25BID, IV heparin.  Echo Pending.  Was given IV lasix 20mg  x 1 at 0557hrs.  Will give another 20mg  now and 40mg  tonight.  BNP was 4800. Reassess in AM.  She probably would need a TEE prior to DCCV but she needs more fluid off.  She already had breakfast.    BP controlled.   Confirmed DNR status with the patient.     LOS: 0 days    HAGER, BRYAN PA-C 09/24/2013 8:30 AM  Patient seen. She is still moderately dyspneic.  No chest pain.  Chest x-ray reviewed and shows cardiomegaly and increased vascular congestion superimposed on chronic lung scarring.  Her B. natruretic peptide is elevated.  She does not have a past history of known CHF.  She does have a history of previous syncope and has been followed by Dr. Percival Spanish with carotid Dopplers and was to have had one today in followup of her known 60-79% internal carotid artery stenoses. We will check a transthoracic echocardiogram today.  She does have a  systolic heart murmur at the base.  She has not had a prior echo.  We will transition her from IV heparin to apixaban over the weekend and consider TEE cardioversion on Monday if she remains in atrial fibrillation.  This will need to be scheduled.  Continue rate control with metoprolol and diltiazem.  Check fasting lipids in the a.m. she does have significant plaque in her carotids.  However she has been intolerant of Lipitor and Pravachol in the past.

## 2013-09-24 NOTE — Progress Notes (Signed)
ANTICOAGULATION CONSULT NOTE - Follow Up Consult  Pharmacy Consult for Heparin --> Apixaban Indication: New onset Afib  Allergies  Allergen Reactions  . Codeine     Unknown  . Diuretic [Buchu-Cornsilk-Ch Grass-Hydran]     Unknown  . Lipitor [Atorvastatin Calcium]     Unknown  . Penicillins Other (See Comments)    unknown  . Pravachol     Unknown    Patient Measurements: Height: 5\' 2"  (157.5 cm) Weight: 210 lb 5.1 oz (95.4 kg) IBW/kg (Calculated) : 50.1 Heparin Dosing Weight:   Vital Signs: Temp: 98.4 F (36.9 C) (08/07 0637) Temp src: Oral (08/07 0637) BP: 134/80 mmHg (08/07 0637) Pulse Rate: 90 (08/07 0637)  Labs:  Recent Labs  09/24/13 0200 09/24/13 0248  HGB 15.4*  --   HCT 45.8  --   PLT 257  --   CREATININE 0.53  --   TROPONINI  --  <0.30    Estimated Creatinine Clearance: 55.4 ml/min (by C-G formula based on Cr of 0.53).   Medications:  Heparin 1000 units/hr   Assessment: 80yof on heparin for new onset Afib. Pharmacy has been consulted to transition patient to apixaban. - CBC wnl - No bleeding reported - LFTs slightly elevated (44/39) - Age 80, SCr 0.53, Wt 95kg    Plan:  1. Apixaban 5mg  PO BID 2. Discontinue heparin drip at time of apixaban administration, heparin levels 3. Follow-up Cardiology plans   Earleen Newport [phone] 09/24/2013,11:24 AM

## 2013-09-25 LAB — BASIC METABOLIC PANEL
Anion gap: 14 (ref 5–15)
BUN: 17 mg/dL (ref 6–23)
CALCIUM: 8.4 mg/dL (ref 8.4–10.5)
CO2: 27 meq/L (ref 19–32)
CREATININE: 0.59 mg/dL (ref 0.50–1.10)
Chloride: 90 mEq/L — ABNORMAL LOW (ref 96–112)
GFR calc Af Amer: >90 mL/min (ref >90)
GFR calc non Af Amer: 81 mL/min — ABNORMAL LOW (ref >90)
GLUCOSE: 106 mg/dL — AB (ref 70–99)
Potassium: 3.9 mEq/L (ref 3.7–5.3)
Sodium: 131 mEq/L — ABNORMAL LOW (ref 137–147)

## 2013-09-25 LAB — URINALYSIS, ROUTINE W REFLEX MICROSCOPIC
Bilirubin Urine: NEGATIVE
Glucose, UA: NEGATIVE mg/dL
Ketones, ur: NEGATIVE mg/dL
Leukocytes, UA: NEGATIVE
NITRITE: NEGATIVE
PROTEIN: NEGATIVE mg/dL
Specific Gravity, Urine: 1.019 (ref 1.005–1.030)
UROBILINOGEN UA: 1 mg/dL (ref 0.0–1.0)
pH: 5.5 (ref 5.0–8.0)

## 2013-09-25 LAB — CBC
HCT: 37.5 % (ref 36.0–46.0)
HEMOGLOBIN: 12.7 g/dL (ref 12.0–15.0)
MCH: 31.7 pg (ref 26.0–34.0)
MCHC: 33.9 g/dL (ref 30.0–36.0)
MCV: 93.5 fL (ref 78.0–100.0)
Platelets: 306 10*3/uL (ref 150–400)
RBC: 4.01 MIL/uL (ref 3.87–5.11)
RDW: 14.2 % (ref 11.5–15.5)
WBC: 11.1 10*3/uL — ABNORMAL HIGH (ref 4.0–10.5)

## 2013-09-25 LAB — LIPID PANEL
CHOL/HDL RATIO: 2.2 ratio
Cholesterol: 191 mg/dL (ref 0–200)
HDL: 86 mg/dL (ref >39)
LDL CALC: 95 mg/dL (ref 0–99)
Triglycerides: 48 mg/dL (ref <150)
VLDL: 10 mg/dL (ref 0–40)

## 2013-09-25 LAB — URINE MICROSCOPIC-ADD ON

## 2013-09-25 MED ORDER — FUROSEMIDE 10 MG/ML IJ SOLN
INTRAMUSCULAR | Status: AC
  Administered 2013-09-25: 40 mg via INTRAVENOUS
  Filled 2013-09-25: qty 4

## 2013-09-25 MED ORDER — FUROSEMIDE 10 MG/ML IJ SOLN
40.0000 mg | Freq: Once | INTRAMUSCULAR | Status: AC
  Administered 2013-09-25: 40 mg via INTRAVENOUS

## 2013-09-25 MED ORDER — FUROSEMIDE 10 MG/ML IJ SOLN
40.0000 mg | Freq: Once | INTRAMUSCULAR | Status: AC
  Administered 2013-09-25: 40 mg via INTRAVENOUS
  Filled 2013-09-25: qty 4

## 2013-09-25 MED ORDER — POTASSIUM CHLORIDE CRYS ER 20 MEQ PO TBCR
20.0000 meq | EXTENDED_RELEASE_TABLET | Freq: Once | ORAL | Status: AC
  Administered 2013-09-25: 20 meq via ORAL
  Filled 2013-09-25: qty 1

## 2013-09-25 MED ORDER — FUROSEMIDE 40 MG PO TABS
40.0000 mg | ORAL_TABLET | Freq: Every day | ORAL | Status: DC
  Filled 2013-09-25: qty 1

## 2013-09-25 NOTE — Progress Notes (Signed)
SUBJECTIVE:  She does not feel well.  Still SOB.     PHYSICAL EXAM Filed Vitals:   09/24/13 0637 09/24/13 1456 09/24/13 1958 09/25/13 0322  BP: 134/80 122/60 131/67 113/60  Pulse: 90 92 90 77  Temp: 98.4 F (36.9 C) 98.7 F (37.1 C) 98.8 F (37.1 C) 98.3 F (36.8 C)  TempSrc: Oral Oral Oral Oral  Resp: 30  20 18   Height: 5\' 2"  (1.575 m)     Weight: 210 lb 5.1 oz (95.4 kg)   205 lb 4 oz (93.1 kg)  SpO2: 95% 95% 96% 94%   General:  Chronically ill appearing Lungs:  Decreased breath sounds with scattered crackles Heart:  RRR Abdomen:  Positive bowel sounds, no rebound no guarding Extremities:  Trace edema   LABS: Lab Results  Component Value Date   TROPONINI <0.30 09/24/2013   Results for orders placed during the hospital encounter of 09/24/13 (from the past 24 hour(s))  CBC     Status: Abnormal   Collection Time    09/25/13  3:21 AM      Result Value Ref Range   WBC 11.1 (*) 4.0 - 10.5 K/uL   RBC 4.01  3.87 - 5.11 MIL/uL   Hemoglobin 12.7  12.0 - 15.0 g/dL   HCT 37.5  36.0 - 46.0 %   MCV 93.5  78.0 - 100.0 fL   MCH 31.7  26.0 - 34.0 pg   MCHC 33.9  30.0 - 36.0 g/dL   RDW 14.2  11.5 - 15.5 %   Platelets 306  150 - 400 K/uL  BASIC METABOLIC PANEL     Status: Abnormal   Collection Time    09/25/13  3:21 AM      Result Value Ref Range   Sodium 131 (*) 137 - 147 mEq/L   Potassium 3.9  3.7 - 5.3 mEq/L   Chloride 90 (*) 96 - 112 mEq/L   CO2 27  19 - 32 mEq/L   Glucose, Bld 106 (*) 70 - 99 mg/dL   BUN 17  6 - 23 mg/dL   Creatinine, Ser 0.59  0.50 - 1.10 mg/dL   Calcium 8.4  8.4 - 10.5 mg/dL   GFR calc non Af Amer 81 (*) >90 mL/min   GFR calc Af Amer >90  >90 mL/min   Anion gap 14  5 - 15  LIPID PANEL     Status: None   Collection Time    09/25/13  3:21 AM      Result Value Ref Range   Cholesterol 191  0 - 200 mg/dL   Triglycerides 48  <150 mg/dL   HDL 86  >39 mg/dL   Total CHOL/HDL Ratio 2.2     VLDL 10  0 - 40 mg/dL   LDL Cholesterol 95  0 - 99 mg/dL     Intake/Output Summary (Last 24 hours) at 09/25/13 1249 Last data filed at 09/25/13 0700  Gross per 24 hour  Intake    240 ml  Output      0 ml  Net    240 ml   ASSESSMENT AND PLAN:  ATRIAL FIB:   Patient is on schedule for TEE/DCCV on Monday.  On Eliquis.  Rate is OK on Cardizem IV .  Continue therapy.   HTN:   BP well controlled.   ACUTE DIASTOLIC HF:    I/O are incomplete.   Weight is down.   I suspect that the ongoing dyspnea is multifactorial.  I will start some oral diuretic however.     Jeneen Rinks [facility] 09/25/2013 12:49 PM

## 2013-09-26 ENCOUNTER — Inpatient Hospital Stay (HOSPITAL_COMMUNITY): Payer: Medicare Other

## 2013-09-26 DIAGNOSIS — I350 Nonrheumatic aortic (valve) stenosis: Secondary | ICD-10-CM | POA: Diagnosis present

## 2013-09-26 DIAGNOSIS — I5031 Acute diastolic (congestive) heart failure: Secondary | ICD-10-CM | POA: Diagnosis present

## 2013-09-26 LAB — BASIC METABOLIC PANEL
Anion gap: 14 (ref 5–15)
BUN: 12 mg/dL (ref 6–23)
CALCIUM: 7.9 mg/dL — AB (ref 8.4–10.5)
CO2: 28 mEq/L (ref 19–32)
CREATININE: 0.59 mg/dL (ref 0.50–1.10)
Chloride: 88 mEq/L — ABNORMAL LOW (ref 96–112)
GFR calc Af Amer: >90 mL/min (ref >90)
GFR calc non Af Amer: 81 mL/min — ABNORMAL LOW (ref >90)
Glucose, Bld: 120 mg/dL — ABNORMAL HIGH (ref 70–99)
Potassium: 3.7 mEq/L (ref 3.7–5.3)
Sodium: 130 mEq/L — ABNORMAL LOW (ref 137–147)

## 2013-09-26 LAB — CBC
HCT: 36.8 % (ref 36.0–46.0)
HEMOGLOBIN: 12.8 g/dL (ref 12.0–15.0)
MCH: 32.3 pg (ref 26.0–34.0)
MCHC: 34.8 g/dL (ref 30.0–36.0)
MCV: 92.9 fL (ref 78.0–100.0)
PLATELETS: 265 10*3/uL (ref 150–400)
RBC: 3.96 MIL/uL (ref 3.87–5.11)
RDW: 13.7 % (ref 11.5–15.5)
WBC: 13.1 10*3/uL — ABNORMAL HIGH (ref 4.0–10.5)

## 2013-09-26 MED ORDER — POTASSIUM CHLORIDE CRYS ER 20 MEQ PO TBCR
20.0000 meq | EXTENDED_RELEASE_TABLET | Freq: Every day | ORAL | Status: DC
  Administered 2013-09-26 – 2013-10-02 (×7): 20 meq via ORAL
  Filled 2013-09-26 (×7): qty 1

## 2013-09-26 MED ORDER — FUROSEMIDE 10 MG/ML IJ SOLN
40.0000 mg | Freq: Every day | INTRAMUSCULAR | Status: DC
  Administered 2013-09-26 – 2013-09-29 (×4): 40 mg via INTRAVENOUS
  Filled 2013-09-26 (×8): qty 4

## 2013-09-26 NOTE — Progress Notes (Signed)
Progress Note   Subjective:  Denies CP.  Still short of breath.  "I just feel listless."     Objective:  Filed Vitals:   09/25/13 2352 09/26/13 0326 09/26/13 0500 09/26/13 0605  BP: 122/76 114/89    Pulse:      Temp: 99 F (37.2 C) 98.9 F (37.2 C)    TempSrc:  Oral    Resp: 26 24    Height:      Weight:    201 lb 15.1 oz (91.6 kg)  SpO2:  92% 94%     Intake/Output from previous day:  Intake/Output Summary (Last 24 hours) at 09/26/13 0959 Last data filed at 09/26/13 0900  Gross per 24 hour  Intake 893.83 ml  Output   2300 ml  Net -1406.17 ml    PHYSICAL EXAM: No acute distress Neck: I do not appreciate JVD Cardiac:  normal S1, S2; irregularly irregular rhythm; no murmur Lungs:  Bilateral crackles at the bases; faint expiratory wheezes Abd: soft, nontender, no hepatomegaly Ext: no edema; Left LE wrap in place Skin: warm and dry Neuro:  CNs 2-12 intact, no focal abnormalities noted   Tele:  AFib HR 90s  Lab Results:  Basic Metabolic Panel:  Recent Labs  09/25/13 0321 09/26/13 0350  NA 131* 130*  K 3.9 3.7  CL 90* 88*  CO2 27 28  GLUCOSE 106* 120*  BUN 17 12  CREATININE 0.59 0.59  CALCIUM 8.4 7.9*    CBC:  Recent Labs  09/25/13 0321 09/26/13 0350  WBC 11.1* 13.1*  HGB 12.7 12.8  HCT 37.5 36.8  MCV 93.5 92.9  PLT 306 265    Cardiac Enzymes:  Recent Labs  09/24/13 0248  TROPONINI <0.30    Echo (09/24/13): Study Conclusions  - Left ventricle: The cavity size was normal. Wall thickness was increased in a pattern of mild LVH. Systolic function was normal. The estimated ejection fraction was in the range of 55% to 60%. - Aortic valve: There was mild stenosis. Valve area (VTI): 1.34 cm^2. Valve area (Vmax): 1.23 cm^2. - Mitral valve: Calcified annulus. There was mild regurgitation. - Left atrium: The atrium was moderately dilated. - Atrial septum: No defect or patent foramen ovale was identified.     Assessment:   Principal  Problem:   Atrial fibrillation Active Problems:   Acute diastolic heart failure   HTN (hypertension)   Hyperlipidemia   Wound of left leg   Occlusion and stenosis of carotid artery without mention of cerebral infarction   Mild aortic stenosis     Plan:   - Rate is controlled.  She remains on Eliquis, Dilt gtt, Metoprolol 25 bid.  TEE/DCCV planned for tomorrow.   - O2 sat 92% on 5 LPM [place].  She got 2 doses of IV Lasix yesterday.  Wt down 4 lbs.  Good UOP yesterday with -2L+.  Suspect she still volume overloaded.  Will continue IV Lasix >>> Lasix 40 IV QD + K+ 20 QD.  Check BMET tomorrow.  Crackles on exam.  Will get CXR.   - There was small Hgb on UA yesterday.  Tmax 99.5.  WBC 13K.  Will get Urine Cx.  Repeat CBC with diff in AM.  If temp increases, would have low threshold to empirically place her on antibiotic (Cipro).    - She has been followed by wound clinic in [place].  Needs dressing change - scheduled for tomorrow.  Will get wound consult.   Julie Dopp, PA-C  09/26/2013 9:59 AM  Pager # [phone]   History and all data above reviewed.  Patient examined.  I agree with the findings as above.  She had some acute dyspnea yesterday.  The patient exam reveals GOT:[id number]  ,  Lungs: Decreased breath sounds with few scattered crackles  ,  Abd: Positive bowel sounds, no rebound no guarding, Ext No edema   .  All available labs, radiology testing, previous records reviewed. Agree with documented assessment and plan. Dyspnea is multifactorial.  We will continue IV diuresis today.  She is on for TEE/DCCV Monday.    Julie Haas  11:00 AM  09/26/2013

## 2013-09-26 NOTE — Progress Notes (Signed)
Orthopedic Tech Progress Note Patient Details:  Julie Haas 07/20/1925 [id number]  Ortho Devices Type of Ortho Device: Haematologist Ortho Device/Splint Location: LLE Ortho Device/Splint Interventions: Ordered;Application   Braulio Bosch 09/26/2013, 6:13 PM

## 2013-09-26 NOTE — Consult Note (Signed)
WOC wound consult note Reason for Consult: Left LE Wound type:Traumatic injury to left LE in November 2014.  Seen by several practitioners, most recently at outpatient wound care center at [facility] where treatment is an Unna's Boot with silver hydrofiber as a wound contact layer with padding (an ABD pad) over the medial malleolus. There are no current wounds.  Patient states that she "bruised the bone" and that this was the extent of the injury. Pressure Ulcer POA: No Measurement: No wound to measure. Wound bed: No wound Drainage (amount, consistency, odor) None. Scant serous fluid (dried) on old hydrofiber dressing. Periwound: With hemosiderin staining and flaking/scaling tissue. Dressing procedure/placement/frequency: We have removed the old Unna's Boot (which was due to be changed on Monday) and washed and dried the extremity.  I have ordered placement of the hydrofiber followed by padding with the ABD pad.  Following this, ortho tech to place Unna's boot and top with 4-inch Coban from metatarsal head to patellar notch (toe to knee).  These are to be changed weekly on Mondays.  Patient will return to the outpatient wound care center at [facility] upon discharge. [name] nursing team will not follow, but will remain available to this patient, the nursing and medical team.  Please re-consult if needed. Thanks, [name], MSN, RN, [name], [name], [name] [phone])

## 2013-09-26 NOTE — Progress Notes (Signed)
Patient c/o sob , on 5l [place], with laboreded breathing. I spoke to on call cardiologist regarding and new order for lasix given. Will cont. To monitor.

## 2013-09-27 ENCOUNTER — Inpatient Hospital Stay (HOSPITAL_COMMUNITY): Payer: Medicare Other

## 2013-09-27 ENCOUNTER — Encounter (HOSPITAL_COMMUNITY)
Admission: EM | Disposition: A | Discharge status: Skilled Nursing Facility | Payer: Self-pay | Source: Home / Self Care | Attending: Cardiovascular Disease

## 2013-09-27 DIAGNOSIS — J189 Pneumonia, unspecified organism: Secondary | ICD-10-CM | POA: Diagnosis not present

## 2013-09-27 LAB — URINE CULTURE
Colony Count: NO GROWTH
Culture: NO GROWTH

## 2013-09-27 LAB — CBC WITH DIFFERENTIAL/PLATELET
BASOS ABS: 0 10*3/uL (ref 0.0–0.1)
Basophils Relative: 0 % (ref 0–1)
Eosinophils Absolute: 0 10*3/uL (ref 0.0–0.7)
Eosinophils Relative: 0 % (ref 0–5)
HCT: 39.5 % (ref 36.0–46.0)
Hemoglobin: 13.7 g/dL (ref 12.0–15.0)
LYMPHS ABS: 1.1 10*3/uL (ref 0.7–4.0)
LYMPHS PCT: 10 % — AB (ref 12–46)
MCH: 31.9 pg (ref 26.0–34.0)
MCHC: 34.7 g/dL (ref 30.0–36.0)
MCV: 92.1 fL (ref 78.0–100.0)
Monocytes Absolute: 1.3 10*3/uL — ABNORMAL HIGH (ref 0.1–1.0)
Monocytes Relative: 11 % (ref 3–12)
Neutro Abs: 9.1 10*3/uL — ABNORMAL HIGH (ref 1.7–7.7)
Neutrophils Relative %: 79 % — ABNORMAL HIGH (ref 43–77)
PLATELETS: 327 10*3/uL (ref 150–400)
RBC: 4.29 MIL/uL (ref 3.87–5.11)
RDW: 13.4 % (ref 11.5–15.5)
WBC: 11.5 10*3/uL — AB (ref 4.0–10.5)

## 2013-09-27 LAB — BASIC METABOLIC PANEL
Anion gap: 15 (ref 5–15)
BUN: 13 mg/dL (ref 6–23)
CO2: 28 mEq/L (ref 19–32)
Calcium: 8.3 mg/dL — ABNORMAL LOW (ref 8.4–10.5)
Chloride: 85 mEq/L — ABNORMAL LOW (ref 96–112)
Creatinine, Ser: 0.49 mg/dL — ABNORMAL LOW (ref 0.50–1.10)
GFR, EST NON AFRICAN AMERICAN: 86 mL/min — AB (ref >90)
Glucose, Bld: 131 mg/dL — ABNORMAL HIGH (ref 70–99)
POTASSIUM: 3.8 meq/L (ref 3.7–5.3)
Sodium: 128 mEq/L — ABNORMAL LOW (ref 137–147)

## 2013-09-27 SURGERY — ECHOCARDIOGRAM, TRANSESOPHAGEAL
Anesthesia: Monitor Anesthesia Care

## 2013-09-27 MED ORDER — FUROSEMIDE 10 MG/ML IJ SOLN
40.0000 mg | Freq: Once | INTRAMUSCULAR | Status: AC
  Administered 2013-09-27: 40 mg via INTRAVENOUS

## 2013-09-27 MED ORDER — LEVALBUTEROL HCL 0.63 MG/3ML IN NEBU
0.6300 mg | INHALATION_SOLUTION | Freq: Four times a day (QID) | RESPIRATORY_TRACT | Status: DC | PRN
  Administered 2013-09-27: 0.63 mg via RESPIRATORY_TRACT
  Filled 2013-09-27: qty 3

## 2013-09-27 MED ORDER — CEFTAZIDIME 1 G IJ SOLR
1.0000 g | Freq: Three times a day (TID) | INTRAMUSCULAR | Status: AC
  Administered 2013-09-27 – 2013-09-30 (×11): 1 g via INTRAVENOUS
  Filled 2013-09-27 (×13): qty 1

## 2013-09-27 MED ORDER — ALBUTEROL SULFATE (2.5 MG/3ML) 0.083% IN NEBU
INHALATION_SOLUTION | RESPIRATORY_TRACT | Status: AC
  Filled 2013-09-27: qty 3

## 2013-09-27 MED ORDER — FUROSEMIDE 10 MG/ML IJ SOLN
INTRAMUSCULAR | Status: AC
  Filled 2013-09-27: qty 4

## 2013-09-27 MED ORDER — VANCOMYCIN HCL 10 G IV SOLR
1750.0000 mg | Freq: Once | INTRAVENOUS | Status: AC
  Administered 2013-09-27: 1750 mg via INTRAVENOUS
  Filled 2013-09-27: qty 1750

## 2013-09-27 MED ORDER — VANCOMYCIN HCL IN DEXTROSE 750-5 MG/150ML-% IV SOLN
750.0000 mg | Freq: Two times a day (BID) | INTRAVENOUS | Status: AC
  Administered 2013-09-27 – 2013-09-29 (×5): 750 mg via INTRAVENOUS
  Filled 2013-09-27 (×6): qty 150

## 2013-09-27 MED ORDER — ALBUTEROL SULFATE (2.5 MG/3ML) 0.083% IN NEBU
2.5000 mg | INHALATION_SOLUTION | Freq: Once | RESPIRATORY_TRACT | Status: AC
  Administered 2013-09-27: 2.5 mg via RESPIRATORY_TRACT

## 2013-09-27 NOTE — Progress Notes (Signed)
OT Cancellation Note  Patient Details Name: Julie Haas MRN: [id number] DOB: 1925/05/18   Cancelled Treatment:    Reason Eval/Treat Not Completed: Medical issues which prohibited therapy. Per nursing, pt with SOB and requests to hold off on OOB at this time. Will check back later time.  [name] [phone] 09/27/2013, 12:19 PM

## 2013-09-27 NOTE — Progress Notes (Addendum)
Subjective:  Better this am but still SOB  Objective:  Vital Signs in the last 24 hours: Temp:  [97.7 F (36.5 C)-98.4 F (36.9 C)] 98 F (36.7 C) (08/10 0403) Pulse Rate:  [84-104] 85 (08/10 0635) Resp:  [24-38] 24 (08/10 0635) BP: (132-143)/(64-79) 132/64 mmHg (08/10 0403) SpO2:  [88 %-98 %] 97 % (08/10 0635) Weight:  [206 lb 9.1 oz (93.7 kg)] 206 lb 9.1 oz (93.7 kg) (08/10 0901)  Intake/Output from previous day:  Intake/Output Summary (Last 24 hours) at 09/27/13 0912 Last data filed at 09/27/13 0500  Gross per 24 hour  Intake    210 ml  Output   1275 ml  Net  -1065 ml    Physical Exam: General appearance: alert, cooperative, no distress, moderately obese, pale and on O2 Neck: no JVD Lungs: diffuse wheezing and rhonchi Heart: irregularly irregular rhythm   Rate: 76  Rhythm: atrial fibrillation  Lab Results:  Recent Labs  09/26/13 0350 09/27/13 0512  WBC 13.1* 11.5*  HGB 12.8 13.7  PLT 265 327    Recent Labs  09/26/13 0350 09/27/13 0512  NA 130* 128*  K 3.7 3.8  CL 88* 85*  CO2 28 28  GLUCOSE 120* 131*  BUN 12 13  CREATININE 0.59 0.49*   No results found for this basename: TROPONINI, CK, MB,  in the last 72 hours No results found for this basename: INR,  in the last 72 hours  Imaging: Imaging results have been reviewed  Cardiac Studies: Ech 09/24/13 Study Conclusions  - Left ventricle: The cavity size was normal. Wall thickness was increased in a pattern of mild LVH. Systolic function was normal. The estimated ejection fraction was in the range of 55% to 60%. - Aortic valve: There was mild stenosis. Valve area (VTI): 1.34 cm^2. Valve area (Vmax): 1.23 cm^2. - Mitral valve: Calcified annulus. There was mild regurgitation. - Left atrium: The atrium was moderately dilated. - Atrial septum: No defect or patent foramen ovale was identified.    Assessment/Plan:   27F with a history of HTN who presents with worsening shortness of breath. ECG  revealed rapid AF. She was rate controlled with diltiazem and anticoagulated. Echo revealed an EF of 55% with mild AS. She was for TEE CV today. Last night she developed increasing dyspnea and hypoxia. She was treated with a nebulizer and IV Lasix. This am she is improved after 2.2L diuresis but still on O2 and she still has diffuse wheezing and rhonchi on exam. Her WBC was noted to be elevated with a Lt shift as well.    Principal Problem:   Acute diastolic heart failure Active Problems:   Atrial fibrillation   Pneumonia   HTN (hypertension)   Hyperlipidemia   Wound of left leg   Bilateral moderate carotid disease by doppler   Mild aortic stenosis with normal LVF    PLAN: Discussed with Dr Acie Fredrickson- cancel cardioversion, IV Lasix 40 mg already ordered for this am. Will start antibiotics and Xopenex for possible hospital acquired pneumonia. She is currently rate controlled on IV Diltiazem.   Kerin Ransom PA-C Beeper [phone] 09/27/2013, 9:12 AM   Attending Note:   The patient was seen and examined.  Agree with assessment and plan as noted above.  Changes made to the above note as needed.  She continues to have respiratory issues.  I do not think this is due to her A-Fib since her HR is well controlled.  Will allow her to eat continue  to tune her up and consider cardioversion later.  Will schedule a TEE / CV at a later time   Ramond Dial., MD, [facility] 09/27/2013, 9:51 AM [address]  [place] Pager [phone]

## 2013-09-27 NOTE — Progress Notes (Signed)
Patient complaints of SOB. Nursing assessment completed. Lungs sounds wet with expiratory wheezes throughout. O2   Sats 88%. Abdominal breathing with tugging and pulling. Respiratory, rapid response, and MD paged.

## 2013-09-27 NOTE — Progress Notes (Addendum)
Called by Lauro Regulus, RN at [phone] with pt c/o sudden onset SOB and desats 88% on 5 L [place].   On arrival to room pt sitting in bed with increased WOB using accessory muscles.  Tachypneic at 38RR  with EW and rales in bilateral lower 1/3 of lung fields.  Diaphoretic, afebrile 98.0   132/64  84 Atrial Fib.  Coughing up  Thick pink tinged phlegm .  Foley with 200 cc dark amber urine over past 8 hours.  Denies Chest Pain.  Albuterol 2.5 mg neb given per protocol.  Stat PCXR ordered.  Notified Dr Elias Else who came to bedside, ordered Lasix 40 mg IV  At 0515 pt's breathing slightly less labored with RR 32 and 02 sats 94% on 5 l [place].  CXR suggests possible pneumonia or possible pulmonary edema as well as small bilateral Pleural effusions.   Hand off report given to Dale, Therapist, sports.  Will continue to follow pt as needed  0635  Asleep,  Less labored RR 24 with O2sats 97% on 5 L [place]

## 2013-09-27 NOTE — Progress Notes (Signed)
PT Cancellation Note  Patient Details Name: Julie Haas MRN: [id number] DOB: [birth year]   Cancelled Treatment:    Reason Eval/Treat Not Completed: Medical issues which prohibited therapy (Pt SOB and not feeling well.)   Mykaela Arena 09/27/2013, 2:28 PM

## 2013-09-27 NOTE — Progress Notes (Signed)
Called by bedside RN:  Patient with sudden increase in respiratory distress.  BP 175/85 HR 129, RR 40 per RN o2 sat initailly 88% on 5L, Now 95% on 6L after respiratory treatment and IV lasix.  BP 127/71  HR 85  RR 32, labored breathing, using acessory muscles, wet non productive cough.  Lung sounds with crackles through out and wheezes.  Placed patient on NRB because patient with continued increased work of breathing.  O2 sats 99% on NRB.  Occ burst of RAF 120-140s then returned to  HR in 80s.  RN spoke with Lurena Joiner PA via phone.  Will continue to monitor.  RN to call if assistance needed.

## 2013-09-27 NOTE — Progress Notes (Addendum)
Patient called out and stated that she was "scared" and "it feels like an elephant is sitting on my chest". Checked VS, O2 87% on 5L O2 & BP 175/85. Called RT and they gave her a PRN Xopenex tx as ordered. Called [place], [place], [place] and he ordered 40 mg IV lasix. Gave lasix as ordered. Rapid response was called as well. O2 came up to 93% on 6 L and BP came down to 127/71. Will continue to monitor closely. Glade Nurse, RN

## 2013-09-27 NOTE — Progress Notes (Addendum)
Cardiology Cross Cover Called due to increased work of breathing. 80 yo presenting with fluid overload and afib with RVR. Rates controlled on diltiazem gtt. Diuresis less successful today, only -200 ml overall. Continued oxygen requ of 5 L/min [place], sats 90-92%. Crackles at bases. Cxray from yesterday raises possiblity of pneumonia on top of fluid overload.  Plan: IV lasix for goal -1 to 2 L out. Noted increased white count but afebrile. Consider HAP abx coverage as well.

## 2013-09-27 NOTE — Progress Notes (Signed)
UR Completed.  Julie Haas [id number] 09/27/2013

## 2013-09-27 NOTE — Progress Notes (Signed)
ANTIBIOTIC CONSULT NOTE - INITIAL  Pharmacy Consult for vancomycin and fortaz Indication: rule out pneumonia  Allergies  Allergen Reactions  . Codeine     Unknown  . Diuretic [Buchu-Cornsilk-Ch Grass-Hydran]     Unknown  . Lipitor [Atorvastatin Calcium]     Unknown  . Penicillins Other (See Comments)    unknown  . Pravachol     Unknown    Patient Measurements: Height: 5\' 2"  (157.5 cm) Weight: 206 lb 9.1 oz (93.7 kg) IBW/kg (Calculated) : 50.1  Vital Signs: Temp: 98 F (36.7 C) (08/10 0403) Temp src: Oral (08/10 0403) BP: 132/64 mmHg (08/10 0403) Pulse Rate: 85 (08/10 0635) Intake/Output from previous day: 08/09 0701 - 08/10 0700 In: 1103.8 [P.O.:320; I.V.:783.8] Out: 1275 [Urine:1275] Intake/Output from this shift:    Labs:  Recent Labs  09/25/13 0321 09/26/13 0350 09/27/13 0512  WBC 11.1* 13.1* 11.5*  HGB 12.7 12.8 13.7  PLT 306 265 327  CREATININE 0.59 0.59 0.49*   Estimated Creatinine Clearance: 54.8 ml/min (by C-G formula based on Cr of 0.49). No results found for this basename: VANCOTROUGH, VANCOPEAK, VANCORANDOM, GENTTROUGH, GENTPEAK, GENTRANDOM, TOBRATROUGH, TOBRAPEAK, TOBRARND, AMIKACINPEAK, AMIKACINTROU, AMIKACIN,  in the last 72 hours   Microbiology: No results found for this or any previous visit (from the past 720 hour(s)).  Medical History: Past Medical History  Diagnosis Date  . Hyperlipidemia   . Arrhythmia     PVC  . Retinal detachment   . Renal disorder   . Allergy   . Occlusion and stenosis of carotid artery without mention of cerebral infarction 03/24/2012  . Pneumonia, organism unspecified 11/192013  . Depressive disorder, not elsewhere classified 03/05/2011  . Other premature beats 03/05/2011  . Other seborrheic keratosis 11/20/2010  . Syncope and collapse 10/02/2010  . Palpitations 11/21/2009  . Unspecified essential hypertension 06/27/2009  . Cardiomegaly 06/27/2009  . Other generalized ischemic cerebrovascular disease 06/27/2009   . Dizziness and giddiness 06/27/2009  . Unspecified vitamin D deficiency 05/16/2009  . Family history of sudden cardiac death (SCD) 12/22/2008  . Rash and other nonspecific skin eruption 07/05/2008  . Edema 03/15/2008  . Obesity, unspecified 07/25/2006  . Varicose veins of lower extremities with inflammation 03/16/2003  . Shortness of breath     Medications:  Prescriptions prior to admission  Medication Sig Dispense Refill  . losartan (COZAAR) 50 MG tablet Take 50 mg by mouth daily.      . metoprolol tartrate (LOPRESSOR) 25 MG tablet Take 25 mg by mouth 2 (two) times daily.      . vitamin E 1000 UNIT capsule Take 1,000 Units by mouth daily.       Assessment: 80 yo lady to start antibiotics for ?PNA.  Her CrCl ~55 ml/min.  She had a sudden onset of SOB and desaturation last night.  Goal of Therapy:  Vancomycin trough level 15-20 mcg/ml  Plan:  Vancomycin 1750 mg IV X 1 then 750 mg IV q12 hours Fortaz 1 gm IV q8 hours F/u renal function, cultures and clinical course Check vanc trough when appropriate.  Thanks for allowing pharmacy to be a part of this patient's care.  Excell Seltzer, PharmD Clinical Pharmacist, [phone] 09/27/2013,10:31 AM

## 2013-09-27 NOTE — Clinical Documentation Improvement (Signed)
Noted in progress notes documentation,  09/27/2013, pt with  sudden onset SOB and desats 88% on 5 L [place],  Possible Clinical Conditions?  Acute Respiratory Failure Acute on Chronic Respiratory Failure Chronic Respiratory Failure Acute Respiratory Insufficiency Acute Respiratory Insufficiency following surgery or trauma Other Condition Cannot Clinically Determine   Treatment:Albuterol, Stat PCXR , Lasix 40 mg IV, O2sats 97% on 5 L [place]   , Thank You, Philippa Chester ,RN Clinical Documentation Specialist:  [place] Information Management

## 2013-09-28 ENCOUNTER — Encounter: Payer: Self-pay | Admitting: Internal Medicine

## 2013-09-28 ENCOUNTER — Inpatient Hospital Stay (HOSPITAL_COMMUNITY): Payer: Medicare Other

## 2013-09-28 DIAGNOSIS — I5031 Acute diastolic (congestive) heart failure: Secondary | ICD-10-CM

## 2013-09-28 DIAGNOSIS — I10 Essential (primary) hypertension: Secondary | ICD-10-CM

## 2013-09-28 LAB — BASIC METABOLIC PANEL
ANION GAP: 12 (ref 5–15)
BUN: 11 mg/dL (ref 6–23)
CO2: 30 mEq/L (ref 19–32)
Calcium: 7.5 mg/dL — ABNORMAL LOW (ref 8.4–10.5)
Chloride: 87 mEq/L — ABNORMAL LOW (ref 96–112)
Creatinine, Ser: 0.45 mg/dL — ABNORMAL LOW (ref 0.50–1.10)
GFR calc Af Amer: >90 mL/min (ref >90)
GFR, EST NON AFRICAN AMERICAN: 89 mL/min — AB (ref >90)
GLUCOSE: 97 mg/dL (ref 70–99)
POTASSIUM: 3.4 meq/L — AB (ref 3.7–5.3)
Sodium: 129 mEq/L — ABNORMAL LOW (ref 137–147)

## 2013-09-28 LAB — CBC WITH DIFFERENTIAL/PLATELET
Basophils Absolute: 0 10*3/uL (ref 0.0–0.1)
Basophils Relative: 0 % (ref 0–1)
Eosinophils Absolute: 0.1 10*3/uL (ref 0.0–0.7)
Eosinophils Relative: 1 % (ref 0–5)
HCT: 34.8 % — ABNORMAL LOW (ref 36.0–46.0)
Hemoglobin: 11.8 g/dL — ABNORMAL LOW (ref 12.0–15.0)
Lymphocytes Relative: 14 % (ref 12–46)
Lymphs Abs: 1.3 10*3/uL (ref 0.7–4.0)
MCH: 31.4 pg (ref 26.0–34.0)
MCHC: 33.9 g/dL (ref 30.0–36.0)
MCV: 92.6 fL (ref 78.0–100.0)
Monocytes Absolute: 1.2 10*3/uL — ABNORMAL HIGH (ref 0.1–1.0)
Monocytes Relative: 12 % (ref 3–12)
Neutro Abs: 7 10*3/uL (ref 1.7–7.7)
Neutrophils Relative %: 73 % (ref 43–77)
Platelets: 299 10*3/uL (ref 150–400)
RBC: 3.76 MIL/uL — ABNORMAL LOW (ref 3.87–5.11)
RDW: 13.5 % (ref 11.5–15.5)
WBC: 9.6 10*3/uL (ref 4.0–10.5)

## 2013-09-28 LAB — PRO B NATRIURETIC PEPTIDE: PRO B NATRI PEPTIDE: 2058 pg/mL — AB (ref 0–450)

## 2013-09-28 NOTE — Progress Notes (Signed)
Subjective: She reports feeling "washed out"  Objective: Vital signs in last 24 hours: Temp:  [97.7 F (36.5 C)-98.6 F (37 C)] 97.7 F (36.5 C) (08/11 0506) Pulse Rate:  [67-129] 87 (08/11 0506) Resp:  [20-32] 20 (08/11 0506) BP: (112-175)/(46-112) 119/47 mmHg (08/11 0506) SpO2:  [89 %-100 %] 94 % (08/11 0506) FiO2 (%):  [100 %] 100 % (08/10 2048) Weight:  [204 lb 9.4 oz (92.8 kg)-206 lb 9.1 oz (93.7 kg)] 204 lb 9.4 oz (92.8 kg) (08/11 0506) Last BM Date: 09/24/13  Intake/Output from previous day: 08/10 0701 - 08/11 0700 In: -  Out: 3450 [Urine:3450] Intake/Output this shift:    Medications Current Facility-Administered Medications  Medication Dose Route Frequency Provider Last Rate Last Dose  . 0.9 %  sodium chloride infusion   Intravenous Continuous Mirna Mires, MD 10 mL/hr at 09/27/13 1119    . acetaminophen (TYLENOL) tablet 650 mg  650 mg Oral Q4H PRN Raliegh Ip, MD      . apixaban (ELIQUIS) tablet 5 mg  5 mg Oral BID Patsey Berthold [place], RPH   5 mg at 09/27/13 2155  . cefTAZidime (FORTAZ) 1 g in dextrose 5 % 50 mL IVPB  1 g Intravenous Q8H Herminio Commons, MD   1 g at 09/28/13 0151  . diltiazem (CARDIZEM) 100 mg in dextrose 5 % 100 mL (1 mg/mL) infusion  5-15 mg/hr Intravenous Titrated Raliegh Ip, MD 10 mL/hr at 09/28/13 0009 10 mg/hr at 09/28/13 0009  . furosemide (LASIX) injection 40 mg  40 mg Intravenous Daily Erlene Quan, PA-C   40 mg at 09/27/13 1113  . levalbuterol (XOPENEX) nebulizer solution 0.63 mg  0.63 mg Nebulization Q6H PRN Erlene Quan, PA-C   0.63 mg at 09/27/13 1758  . losartan (COZAAR) tablet 50 mg  50 mg Oral Daily Raliegh Ip, MD   50 mg at 09/27/13 1115  . metoprolol tartrate (LOPRESSOR) tablet 25 mg  25 mg Oral BID Raliegh Ip, MD   25 mg at 09/27/13 2155  . ondansetron (ZOFRAN) injection 4 mg  4 mg Intravenous Q6H PRN Raliegh Ip, MD      . potassium chloride SA (K-DUR,KLOR-CON) CR tablet 20 mEq  20 mEq Oral Daily Liliane Shi,  PA-C   20 mEq at 09/27/13 1115  . vancomycin (VANCOCIN) IVPB 750 mg/150 ml premix  750 mg Intravenous Q12H Herminio Commons, MD   750 mg at 09/27/13 2155  . vitamin E capsule 1,000 Units  1,000 Units Oral Daily Raliegh Ip, MD   1,000 Units at 09/27/13 1114    PE: General appearance: alert, cooperative and fatigued Lungs: Bilateral rales. [place] changes on the right.  No wheezing.  Heart: irregularly irregular rhythm and 2/6 sys MM Extremities: No LEE Pulses: 2+ and symmetric Skin: Warm and dry Neurologic: Grossly normal  Lab Results:   Recent Labs  09/26/13 0350 09/27/13 0512 09/28/13 0348  WBC 13.1* 11.5* 9.6  HGB 12.8 13.7 11.8*  HCT 36.8 39.5 34.8*  PLT 265 327 299   BMET  Recent Labs  09/26/13 0350 09/27/13 0512 09/28/13 0348  NA 130* 128* 129*  K 3.7 3.8 3.4*  CL 88* 85* 87*  CO2 28 28 30   GLUCOSE 120* 131* 97  BUN 12 13 11   CREATININE 0.59 0.49* 0.45*  CALCIUM 7.9* 8.3* 7.5*   PORTABLE CHEST - 1 VIEW  COMPARISON: Chest radiograph performed 09/26/2013  FINDINGS:  The lungs are  well-aerated. Vascular congestion is noted, with  diffuse bilateral airspace opacities, possibly reflecting multifocal  pneumonia or pulmonary edema. Small bilateral pleural effusions are  seen. No pneumothorax is identified.  The cardiomediastinal silhouette is mildly enlarged. No acute  osseous abnormalities are seen.  IMPRESSION:  Vascular congestion and mild cardiomegaly, with diffuse bilateral  airspace opacities, possibly reflecting multifocal pneumonia or  pulmonary edema. Small bilateral pleural effusions seen.   Assessment/Plan      Acute diastolic heart failure Net fluids:  -3.5L/-5.7L.  Cont IV lasix today.      Acute respiratory failure Secondary to Acute diastolic CHF and Afib RVR.  Improved after additional IV lasix and nonrebreather last night.  Repeat CXR this morning and BNP.     HTN (hypertension)  BP controlled this morning.     Hyperlipidemia    Wound of left leg  Seen by Wound Care.  Una boot applied.    Bilateral moderate carotid disease by doppler   Atrial fibrillation Currently rate controlled on IV dilt at 10mg /hr, lopressor 25mg  BID.  She did have some RVR around 1800hrs last night when she was in respiratory distress.  HR does go into the 40's when sleeping. Anticoagulation with Eliquis.   Not ready for TEE/DCCV.    Mild aortic stenosis with normal LVF   Pneumonia  Fortaz and vancomycin started yesterday.  WBC decreased.  Continue.     LOS: 4 days    HAGER, BRYAN PA-C 09/28/2013 8:37 AM  Attending Note:   The patient was seen and examined.  Agree with assessment and plan as noted above.  Changes made to the above note as needed.  She still needs some tuning before TEE / CV.   I do not think that the A-Fib is causing her decompensation.     Thayer Headings, Brooke Bonito., MD, [facility] 09/28/2013, 9:48 AM [address]  [place] Pager [phone]

## 2013-09-28 NOTE — Progress Notes (Signed)
Occupational Therapy Treatment Patient Details Name: Julie Haas MRN: [id number] DOB: 07-19-1925 Today's Date: 09/28/2013    History of present illness Pt adm on 09/24/13 with Afib with RVR.    OT comments  Pt with limited participation this session in OT, would only agree to attempt sitting up on the EOB to eat.  Mod to max assist for transition to sitting with supervision once sitting on the side of the bed.  O2 sats 95% on 5Ls nasal cannula with HR 81 BPM.  Will continue to follow for OT needs and increased participation to work toward modified independent level goals.  Follow Up Recommendations  Home health OT;Supervision - Intermittent          Precautions / Restrictions Precautions Precautions: Fall Restrictions Weight Bearing Restrictions: No       Mobility Bed Mobility Overal bed mobility: Needs Assistance Bed Mobility: Supine to Sit     Supine to sit: Mod assist     General bed mobility comments: Pt required mod instructional cueing for hand placement during transfer to EOB as well as mod assist for sitting trunk up.   Transfers                      Balance Overall balance assessment: Needs assistance Sitting-balance support: Single extremity supported Sitting balance-Leahy Scale: Fair                             ADL Overall ADL's : Needs assistance/impaired Eating/Feeding: Supervision/ safety;Sitting                                     General ADL Comments: Pt with increased fatigue this session secondary to respiratory issues yesterday.  Did agree to sit EOB to eat her lunch but would not transfer to bedside chair.  Supervision level for opening packets and cutting up her food.                 Cognition   Behavior During Therapy: Flat affect Overall Cognitive Status: Within Functional Limits for tasks assessed                                    Pertinent Vitals/ Pain       Pain Assessment:  No/denies pain         Frequency Min 2X/week     Progress Toward Goals  OT Goals(current goals can now be found in the care plan section)  Progress towards OT goals: Not progressing toward goals - comment (Pt with new onset respiratory decline over the last day.  Will continue to re-assess goals in function.)     Plan Discharge plan remains appropriate       End of Session Equipment Utilized During Treatment: Oxygen   Activity Tolerance Patient limited by fatigue   Patient Left in bed;Other (comment) (Pt eating lunch sitting EOB, nursing tech made aware.)           Time: 5631-4970 OT Time Calculation (min): 25 min  Charges: OT General Charges $OT Visit: 1 Procedure OT Treatments $Self Care/Home Management : 23-37 mins  Calixto Pavel OTR/L 09/28/2013, 12:27 PM

## 2013-09-28 NOTE — Progress Notes (Addendum)
Physical Therapy Treatment Patient Details Name: Julie Haas MRN: [id number] DOB: 28-Jul-1925 Today's Date: 10-26-13    History of Present Illness Pt adm on 09/24/13 with Afib with RVR. Pt now with acute CHF and SOB.    PT Comments    Pt initially sleepy but with encouragement able to participate. Progress is slow and feel pt will not be able to return to her independent apt at [place] she will need ST-SNF at [facility].  Follow Up Recommendations  SNF     Equipment Recommendations  None recommended by PT    Recommendations for Other Services       Precautions / Restrictions Precautions Precautions: Fall    Mobility  Bed Mobility Overal bed mobility: Needs Assistance Bed Mobility: Supine to Sit     Supine to sit: Min assist     General bed mobility comments: Assist to initiate and to elevate trunk.  Transfers Overall transfer level: Needs assistance Equipment used: 4-wheeled walker Transfers: Sit to/from Stand Sit to Stand: Min assist         General transfer comment: Assist to bring hips up and for balance  Ambulation/Gait Ambulation/Gait assistance: Min assist Ambulation Distance (Feet): 125 Feet Assistive device: 4-wheeled walker Gait Pattern/deviations: Step-through pattern;Trunk flexed;Decreased stride length Gait velocity: decr Gait velocity interpretation: Below normal speed for age/gender General Gait Details: Pt better able to handle and control rollator. Verbal cues to stand more erect and assist for balance. Amb on 6L of O2.   Stairs            Wheelchair Mobility    Modified Rankin (Stroke Patients Only)       Balance Overall balance assessment: Needs assistance Sitting-balance support: No upper extremity supported Sitting balance-Leahy Scale: Fair     Standing balance support: Single extremity supported Standing balance-Leahy Scale: Poor Standing balance comment: Pt stood at sink for self care.                    Cognition Arousal/Alertness: Awake/alert Behavior During Therapy: WFL for tasks assessed/performed Overall Cognitive Status: Within Functional Limits for tasks assessed                      Exercises      General Comments        Pertinent Vitals/Pain      Home Living                      Prior Function            PT Goals (current goals can now be found in the care plan section) Progress towards PT goals: Progressing toward goals    Frequency  Min 2X/week    PT Plan Discharge plan needs to be updated    Co-evaluation             End of Session Equipment Utilized During Treatment: Gait belt Activity Tolerance: Patient limited by fatigue Patient left: in chair;with call bell/phone within reach     Time:  -     Charges:                       G Codes:      Julie Haas 2013/10/26, 3:44 PM  [facility] PT [phone]

## 2013-09-29 DIAGNOSIS — I359 Nonrheumatic aortic valve disorder, unspecified: Secondary | ICD-10-CM

## 2013-09-29 LAB — BASIC METABOLIC PANEL
ANION GAP: 14 (ref 5–15)
BUN: 11 mg/dL (ref 6–23)
CHLORIDE: 89 meq/L — AB (ref 96–112)
CO2: 28 mEq/L (ref 19–32)
Calcium: 8 mg/dL — ABNORMAL LOW (ref 8.4–10.5)
Creatinine, Ser: 0.47 mg/dL — ABNORMAL LOW (ref 0.50–1.10)
GFR calc non Af Amer: 88 mL/min — ABNORMAL LOW (ref >90)
Glucose, Bld: 83 mg/dL (ref 70–99)
POTASSIUM: 3.6 meq/L — AB (ref 3.7–5.3)
SODIUM: 131 meq/L — AB (ref 137–147)

## 2013-09-29 LAB — VANCOMYCIN, TROUGH: VANCOMYCIN TR: 10.7 ug/mL (ref 10.0–20.0)

## 2013-09-29 MED ORDER — LEVALBUTEROL HCL 0.63 MG/3ML IN NEBU
0.6300 mg | INHALATION_SOLUTION | RESPIRATORY_TRACT | Status: DC | PRN
  Administered 2013-10-02: 0.63 mg via RESPIRATORY_TRACT
  Filled 2013-09-29: qty 3

## 2013-09-29 MED ORDER — VANCOMYCIN HCL IN DEXTROSE 1-5 GM/200ML-% IV SOLN
1000.0000 mg | Freq: Two times a day (BID) | INTRAVENOUS | Status: AC
  Administered 2013-09-30 (×2): 1000 mg via INTRAVENOUS
  Filled 2013-09-29 (×2): qty 200

## 2013-09-29 MED ORDER — LEVALBUTEROL HCL 0.63 MG/3ML IN NEBU
0.6300 mg | INHALATION_SOLUTION | Freq: Four times a day (QID) | RESPIRATORY_TRACT | Status: DC
  Administered 2013-09-29: 0.63 mg via RESPIRATORY_TRACT
  Filled 2013-09-29: qty 3

## 2013-09-29 NOTE — Progress Notes (Signed)
ANTIBIOTIC CONSULT NOTE - FOLLOW UP  Pharmacy Consult for Vancomycin Indication: rule out pneumonia  Allergies  Allergen Reactions  . Codeine     Unknown  . Diuretic [Buchu-Cornsilk-Ch Grass-Hydran]     Unknown  . Lipitor [Atorvastatin Calcium]     Unknown  . Penicillins Other (See Comments)    unknown  . Pravachol     Unknown    Patient Measurements: Height: 5\' 2"  (157.5 cm) Weight: 203 lb 7.8 oz (92.3 kg) IBW/kg (Calculated) : 50.1  Vital Signs: Temp: 97.6 F (36.4 C) (08/12 1947) Temp src: Oral (08/12 1947) BP: 124/40 mmHg (08/12 1947) Pulse Rate: 104 (08/12 1947) Intake/Output from previous day: 08/11 0701 - 08/12 0700 In: 480 [P.O.:480] Out: 2450 [Urine:2450] Intake/Output from this shift:    Labs:  Recent Labs  09/27/13 0512 09/28/13 0348 09/29/13 0435  WBC 11.5* 9.6  --   HGB 13.7 11.8*  --   PLT 327 299  --   CREATININE 0.49* 0.45* 0.47*   Estimated Creatinine Clearance: 54.4 ml/min (by C-G formula based on Cr of 0.47).  Recent Labs  09/29/13 2033  [place] 10.7     Microbiology: Recent Results (from the past 720 hour(s))  URINE CULTURE     Status: None   Collection Time    09/26/13 10:21 AM      Result Value Ref Range Status   Specimen Description URINE, CATHETERIZED   Final   Special Requests NONE   Final   Culture  Setup Time     Final   Value: 09/26/2013 18:53     Performed at [place]     Final   Value: NO GROWTH     Performed at [facility]   Culture     Final   Value: NO GROWTH     Performed at [facility]   Report Status 09/27/2013 FINAL   Final    Anti-infectives   Start     Dose/Rate Route Frequency Ordered Stop   09/27/13 2130  vancomycin (VANCOCIN) IVPB 750 mg/150 ml premix     750 mg 150 mL/hr over 60 Minutes Intravenous Every 12 hours 09/27/13 0929     09/27/13 0930  vancomycin (VANCOCIN) 1,750 mg in sodium chloride 0.9 % 500 mL IVPB     1,750 mg 250 mL/hr over 120  Minutes Intravenous  Once 09/27/13 0928 09/27/13 1313   09/27/13 0930  cefTAZidime (FORTAZ) 1 g in dextrose 5 % 50 mL IVPB     1 g 100 mL/hr over 30 Minutes Intravenous Every 8 hours 09/27/13 0928        Assessment: 87 YOF started on Vancomycin + Fortaz on 8/10 for empiric HAP coverage. A Vancomycin trough this evening is SUBtherapeutic (VT 10.7, goal of 15-20 mcg/ml). Renal function is stable. Given the patient's advanced age, will increase conservatively to target the lower end of the goal range.   Goal of Therapy:  Vancomycin trough level 15-20 mcg/ml  Plan:  1. Increase Vancomycin to 1000 mg IV every 12 hours 2. Will continue to follow renal function, culture results, LOT, and antibiotic de-escalation plans   Alycia Rossetti, PharmD, BCPS Clinical Pharmacist Pager: [phone] 09/29/2013 9:34 PM

## 2013-09-29 NOTE — Progress Notes (Signed)
Occupational Therapy Treatment Patient Details Name: Julie Haas MRN: [id number] DOB: 06-Jun-1925 Today's Date: 09/29/2013    History of present illness Pt adm on 09/24/13 with Afib with RVR. Pt now with acute CHF and SOB.   OT comments  Pt with very limited activity today.  With max encouragement she agreed only to ambulate 4 ft to her recliner for lunch.  Encouraged pt to sit up as long as she could tolerate.  Pt required mod A today and required a significant amount of time to complete the task. Recommend SNF   Follow Up Recommendations  SNF    Equipment Recommendations  None recommended by OT    Recommendations for Other Services      Precautions / Restrictions Precautions Precautions: Fall       Mobility Bed Mobility Overal bed mobility: Needs Assistance Bed Mobility: Supine to Sit     Supine to sit: Min assist     General bed mobility comments: step by step cues for proper technique and assist to lift shoulders.     Transfers Overall transfer level: Needs assistance Equipment used: Rolling walker (2 wheeled) Transfers: Sit to/from [facility] Sit to Stand: Mod assist Stand pivot transfers: Mod assist       General transfer comment: verbal cues for hand placement and assist to move into standing and to advance walker and ambulate 4 steps to recliner    Balance Overall balance assessment: Needs assistance Sitting-balance support: Feet supported Sitting balance-Leahy Scale: Fair     Standing balance support: Bilateral upper extremity supported Standing balance-Leahy Scale: Poor                     ADL Overall ADL's : Needs assistance/impaired                     Lower Body Dressing: Maximal assistance;Sit to/from stand   Toilet Transfer: Moderate assistance;Ambulation;RW Toilet Transfer Details (indicate cue type and reason): Pt moves exceptionally slowly         Functional mobility during ADLs: Moderate  assistance;Cueing for safety;Rolling walker General ADL Comments: Pt requires max encouragement to participate.  She initially states that she doesn't want to get up to the recliner because she sat up for 4 hours 2 days ago.  Discussed normal activity at home with pt eventually agreeing to at least ambulate 4 feet to the chair for lunch - she would not agree to more activity.  She moves very very slowly and requested a very long rest break once she moved to EOB and before standing       Vision                     Perception     Praxis      Cognition   Behavior During Therapy: [facility] for tasks assessed/performed Overall Cognitive Status: Within Functional Limits for tasks assessed                       Extremity/Trunk Assessment               Exercises     Shoulder Instructions       General Comments      Pertinent Vitals/ Pain       Pain Assessment: No/denies pain  Home Living  Prior Functioning/Environment              Frequency Min 2X/week     Progress Toward Goals  OT Goals(current goals can now be found in the care plan section)  Progress towards OT goals: Not progressing toward goals - comment (fatigue and limited activity )  ADL Goals Pt Will Perform Grooming: with modified independence;standing Pt Will Transfer to Toilet: with modified independence;ambulating Pt Will Perform Toileting - Clothing Manipulation and hygiene: with modified independence;sit to/from stand Additional ADL Goal #1: Pt will be able to independently verbalize 3 energy conservation techniques she can use during daily ADLs. Additional ADL Goal #2: Pt will be able to retrieve ADL items at mod I level with no LOB.   Plan Discharge plan needs to be updated    Co-evaluation                 End of Session Equipment Utilized During Treatment: Oxygen   Activity Tolerance Patient limited by fatigue    Patient Left in chair   Nurse Communication Mobility status        Time: 9767-3419 OT Time Calculation (min): 23 min  Charges: OT General Charges $OT Visit: 1 Procedure OT Treatments $Therapeutic Activity: 23-37 mins  Niah Heinle M 09/29/2013, 6:30 PM

## 2013-09-29 NOTE — Progress Notes (Signed)
Subjective: She reports feeling "washed out"  Objective: Vital signs in last 24 hours: Temp:  [97.8 F (36.6 C)-98.2 F (36.8 C)] 98.2 F (36.8 C) (08/12 0430) Pulse Rate:  [78-81] 78 (08/12 0430) Resp:  [18-19] 18 (08/12 0430) BP: (110-131)/(64-83) 110/64 mmHg (08/12 0430) SpO2:  [95 %-98 %] 96 % (08/12 0430) Weight:  [203 lb 7.8 oz (92.3 kg)] 203 lb 7.8 oz (92.3 kg) (08/12 0430) Last BM Date: 09/24/13 (per patient, no bm since admit, not eating well)  Intake/Output from previous day: 08/11 0701 - 08/12 0700 In: 480 [P.O.:480] Out: 2450 [Urine:2450] Intake/Output this shift:    Medications Current Facility-Administered Medications  Medication Dose Route Frequency Provider Last Rate Last Dose  . 0.9 %  sodium chloride infusion   Intravenous Continuous Mirna Mires, MD 10 mL/hr at 09/27/13 1119    . acetaminophen (TYLENOL) tablet 650 mg  650 mg Oral Q4H PRN Raliegh Ip, MD      . apixaban (ELIQUIS) tablet 5 mg  5 mg Oral BID Patsey Berthold [place], RPH   5 mg at 09/29/13 1032  . cefTAZidime (FORTAZ) 1 g in dextrose 5 % 50 mL IVPB  1 g Intravenous Q8H Herminio Commons, MD   1 g at 09/29/13 0906  . diltiazem (CARDIZEM) 100 mg in dextrose 5 % 100 mL (1 mg/mL) infusion  5-15 mg/hr Intravenous Titrated Raliegh Ip, MD 5 mL/hr at 09/28/13 1715 5 mg/hr at 09/28/13 1715  . furosemide (LASIX) injection 40 mg  40 mg Intravenous Daily Erlene Quan, PA-C   40 mg at 09/29/13 1033  . levalbuterol (XOPENEX) nebulizer solution 0.63 mg  0.63 mg Nebulization Q6H PRN Erlene Quan, PA-C   0.63 mg at 09/27/13 1758  . losartan (COZAAR) tablet 50 mg  50 mg Oral Daily Raliegh Ip, MD   50 mg at 09/29/13 1032  . metoprolol tartrate (LOPRESSOR) tablet 25 mg  25 mg Oral BID Raliegh Ip, MD   25 mg at 09/29/13 1032  . ondansetron (ZOFRAN) injection 4 mg  4 mg Intravenous Q6H PRN Raliegh Ip, MD      . potassium chloride SA (K-DUR,KLOR-CON) CR tablet 20 mEq  20 mEq Oral Daily Liliane Shi, PA-C    20 mEq at 09/29/13 1032  . vancomycin (VANCOCIN) IVPB 750 mg/150 ml premix  750 mg Intravenous Q12H Herminio Commons, MD   750 mg at 09/29/13 1032  . vitamin E capsule 1,000 Units  1,000 Units Oral Daily Raliegh Ip, MD   1,000 Units at 09/29/13 1033    PE: General appearance: alert, cooperative and fatigued Lungs: Bilateral rales. Egophony changes on the right.  No wheezing.  Heart: irregularly irregular rhythm and 2/6 sys MM Extremities: No LEE Pulses: 2+ and symmetric Skin: Warm and dry Neurologic: Grossly normal  Lab Results:   Recent Labs  09/27/13 0512 09/28/13 0348  WBC 11.5* 9.6  HGB 13.7 11.8*  HCT 39.5 34.8*  PLT 327 299   BMET  Recent Labs  09/27/13 0512 09/28/13 0348 09/29/13 0435  NA 128* 129* 131*  K 3.8 3.4* 3.6*  CL 85* 87* 89*  CO2 28 30 28   GLUCOSE 131* 97 83  BUN 13 11 11   CREATININE 0.49* 0.45* 0.47*  CALCIUM 8.3* 7.5* 8.0*   PORTABLE CHEST - 1 VIEW  COMPARISON: Chest radiograph performed 09/26/2013  FINDINGS:  The lungs are well-aerated. Vascular congestion is noted, with  diffuse bilateral airspace opacities,  possibly reflecting multifocal  pneumonia or pulmonary edema. Small bilateral pleural effusions are  seen. No pneumothorax is identified.  The cardiomediastinal silhouette is mildly enlarged. No acute  osseous abnormalities are seen.  IMPRESSION:  Vascular congestion and mild cardiomegaly, with diffuse bilateral  airspace opacities, possibly reflecting multifocal pneumonia or  pulmonary edema. Small bilateral pleural effusions seen.   Assessment/Plan      Acute diastolic heart failure Net fluids:  -3.5L/-5.7L.  Cont IV lasix today.  She continues to diurese briskly     Acute respiratory failure Secondary to Acute diastolic CHF and Afib RVR.  Improved after additional IV lasix and nonrebreather last night.  Repeat CXR this morning and BNP.     HTN (hypertension)  BP controlled this morning.     Hyperlipidemia   Wound of  left leg  Seen by Wound Care.  Una boot applied.    Bilateral moderate carotid disease by doppler   Atrial fibrillation Her HR is very well controlled.  I do not think it is necessary to do TEE/CV ungently.  We need to tune up her lungs furst    Mild aortic stenosis with normal LVF   Pneumonia  Fortaz and vancomycin started yesterday.  WBC decreased.  Continue.  Will order nebs on a scheduled basis.  Will reorder CHest xray tomorro    Thayer Headings, Brooke Bonito., MD, [facility] 09/29/2013, 10:52 AM [address]  [place] Pager [phone]

## 2013-09-30 ENCOUNTER — Inpatient Hospital Stay (HOSPITAL_COMMUNITY): Payer: Medicare Other

## 2013-09-30 DIAGNOSIS — J189 Pneumonia, unspecified organism: Secondary | ICD-10-CM

## 2013-09-30 LAB — BASIC METABOLIC PANEL
Anion gap: 11 (ref 5–15)
BUN: 9 mg/dL (ref 6–23)
CALCIUM: 8.2 mg/dL — AB (ref 8.4–10.5)
CHLORIDE: 94 meq/L — AB (ref 96–112)
CO2: 33 mEq/L — ABNORMAL HIGH (ref 19–32)
Creatinine, Ser: 0.47 mg/dL — ABNORMAL LOW (ref 0.50–1.10)
GFR calc non Af Amer: 88 mL/min — ABNORMAL LOW (ref >90)
Glucose, Bld: 93 mg/dL (ref 70–99)
Potassium: 3.9 mEq/L (ref 3.7–5.3)
SODIUM: 138 meq/L (ref 137–147)

## 2013-09-30 MED ORDER — SODIUM CHLORIDE 0.9 % IJ SOLN
3.0000 mL | Freq: Two times a day (BID) | INTRAMUSCULAR | Status: DC
  Administered 2013-09-30 – 2013-10-02 (×4): 3 mL via INTRAVENOUS

## 2013-09-30 MED ORDER — FUROSEMIDE 40 MG PO TABS
40.0000 mg | ORAL_TABLET | Freq: Every day | ORAL | Status: DC
  Administered 2013-09-30 – 2013-10-02 (×3): 40 mg via ORAL
  Filled 2013-09-30 (×3): qty 1

## 2013-09-30 MED ORDER — METOPROLOL TARTRATE 50 MG PO TABS
50.0000 mg | ORAL_TABLET | Freq: Two times a day (BID) | ORAL | Status: DC
  Administered 2013-09-30 – 2013-10-02 (×5): 50 mg via ORAL
  Filled 2013-09-30 (×6): qty 1

## 2013-09-30 MED ORDER — SODIUM CHLORIDE 0.9 % IV SOLN: 250.0000 mL | INTRAVENOUS | Status: DC

## 2013-09-30 MED ORDER — LEVOFLOXACIN 750 MG PO TABS
750.0000 mg | ORAL_TABLET | Freq: Every day | ORAL | Status: DC
  Administered 2013-10-01 – 2013-10-02 (×2): 750 mg via ORAL
  Filled 2013-09-30 (×2): qty 1

## 2013-09-30 MED ORDER — SODIUM CHLORIDE 0.9 % IJ SOLN: 3.0000 mL | INTRAMUSCULAR | Status: DC | PRN

## 2013-09-30 NOTE — Progress Notes (Signed)
Physical Therapy Treatment Patient Details Name: Julie Haas MRN: [id number] DOB: October 26, 1925 Today's Date: 2013/10/30    History of Present Illness Pt adm on 09/24/13 with Afib with RVR. Pt now with acute CHF and SOB.    PT Comments    Pt continues to make slow progress. Pt able to amb on RA with SaO2 90% or greater.  Follow Up Recommendations  SNF     Equipment Recommendations  None recommended by PT    Recommendations for Other Services       Precautions / Restrictions Precautions Precautions: Fall    Mobility  Bed Mobility                  Transfers Overall transfer level: Needs assistance Equipment used: 4-wheeled walker Transfers: Sit to/from Stand Sit to Stand: Mod assist         General transfer comment: Assist to bring hips up.  Ambulation/Gait Ambulation/Gait assistance: Min assist Ambulation Distance (Feet): 100 Feet Assistive device: 4-wheeled walker Gait Pattern/deviations: Step-through pattern;Decreased step length - right;Decreased step length - left;Trunk flexed Gait velocity: decr Gait velocity interpretation: Below normal speed for age/gender General Gait Details: Verbal cues to stand more erect and stay closer to rollator.   Stairs            Wheelchair Mobility    Modified Rankin (Stroke Patients Only)       Balance Overall balance assessment: Needs assistance Sitting-balance support: No upper extremity supported;Feet supported Sitting balance-Leahy Scale: Fair     Standing balance support: Bilateral upper extremity supported;During functional activity Standing balance-Leahy Scale: Poor Standing balance comment: Pt stood at sink x 6-8 minutes with forearms propped on counter with min guard while she brushed teeth, combed hair.                    Cognition Arousal/Alertness: Awake/alert Behavior During Therapy: WFL for tasks assessed/performed Overall Cognitive Status: Within Functional Limits for tasks  assessed                      Exercises      General Comments        Pertinent Vitals/Pain Pain Assessment: No/denies pain    Home Living                      Prior Function            PT Goals (current goals can now be found in the care plan section) Progress towards PT goals: Progressing toward goals    Frequency  Min 2X/week    PT Plan Current plan remains appropriate;Frequency needs to be updated    Co-evaluation             End of Session Equipment Utilized During Treatment: Gait belt Activity Tolerance: Patient limited by fatigue Patient left: in bed;with call bell/phone within reach (sitting EOB)     Time: 1761-6073 PT Time Calculation (min): 30 min  Charges:  $Gait Training: 23-37 mins                    G Codes:      Devonta Blanford Oct 30, 2013, 4:35 PM  [facility] PT [phone]

## 2013-09-30 NOTE — Progress Notes (Signed)
Patient weaned to 3L O2. SATS 93-96% Roxan Hockey, RN

## 2013-09-30 NOTE — Progress Notes (Signed)
Subjective: She reports feeling "washed out" Still diuresing well.   Objective: Vital signs in last 24 hours: Temp:  [97.3 F (36.3 C)-97.6 F (36.4 C)] 97.5 F (36.4 C) (08/13 0644) Pulse Rate:  [46-104] 46 (08/13 0644) Resp:  [17-18] 18 (08/13 0644) BP: (100-124)/(40-75) 115/75 mmHg (08/13 0644) SpO2:  [94 %-95 %] 95 % (08/13 0644) Weight:  [203 lb 0.7 oz (92.1 kg)] 203 lb 0.7 oz (92.1 kg) (08/13 0644) Last BM Date: 09/24/13 (per pt; not eating well)  Intake/Output from previous day: 08/12 0701 - 08/13 0700 In: 480 [P.O.:480] Out: 4050 [Urine:4050] Intake/Output this shift:    Medications Current Facility-Administered Medications  Medication Dose Route Frequency Provider Last Rate Last Dose  . 0.9 %  sodium chloride infusion   Intravenous Continuous Mirna Mires, MD 10 mL/hr at 09/27/13 1119    . acetaminophen (TYLENOL) tablet 650 mg  650 mg Oral Q4H PRN Raliegh Ip, MD      . apixaban (ELIQUIS) tablet 5 mg  5 mg Oral BID Patsey Berthold Walker, RPH   5 mg at 09/29/13 2151  . cefTAZidime (FORTAZ) 1 g in dextrose 5 % 50 mL IVPB  1 g Intravenous Q8H Herminio Commons, MD   1 g at 09/30/13 0752  . diltiazem (CARDIZEM) 100 mg in dextrose 5 % 100 mL (1 mg/mL) infusion  5-15 mg/hr Intravenous Titrated Raliegh Ip, MD 5 mL/hr at 09/28/13 1715 5 mg/hr at 09/28/13 1715  . furosemide (LASIX) injection 40 mg  40 mg Intravenous Daily Erlene Quan, PA-C   40 mg at 09/29/13 1033  . levalbuterol (XOPENEX) nebulizer solution 0.63 mg  0.63 mg Nebulization Q4H PRN Herminio Commons, MD      . losartan (COZAAR) tablet 50 mg  50 mg Oral Daily Raliegh Ip, MD   50 mg at 09/29/13 1032  . metoprolol tartrate (LOPRESSOR) tablet 25 mg  25 mg Oral BID Raliegh Ip, MD   25 mg at 09/29/13 2151  . ondansetron (ZOFRAN) injection 4 mg  4 mg Intravenous Q6H PRN Raliegh Ip, MD      . potassium chloride SA (K-DUR,KLOR-CON) CR tablet 20 mEq  20 mEq Oral Daily Liliane Shi, PA-C   20 mEq at  09/29/13 1032  . vancomycin (VANCOCIN) IVPB 1000 mg/200 mL premix  1,000 mg Intravenous Q12H Rolla Flatten, [facility]      . vitamin E capsule 1,000 Units  1,000 Units Oral Daily Raliegh Ip, MD   1,000 Units at 09/29/13 1033    PE: General appearance: alert, cooperative and fatigued Lungs: Bilateral rales. Egophony changes on the right.  No wheezing.  Heart: irregularly irregular rhythm and 2/6 sys MM Extremities: No LEE Pulses: 2+ and symmetric Skin: Warm and dry Neurologic: Grossly normal  Lab Results:   Recent Labs  09/28/13 0348  WBC 9.6  HGB 11.8*  HCT 34.8*  PLT 299   BMET  Recent Labs  09/28/13 0348 09/29/13 0435 09/30/13 0300  NA 129* 131* 138  K 3.4* 3.6* 3.9  CL 87* 89* 94*  CO2 30 28 33*  GLUCOSE 97 83 93  BUN 11 11 9   CREATININE 0.45* 0.47* 0.47*  CALCIUM 7.5* 8.0* 8.2*   PORTABLE CHEST - 1 VIEW  COMPARISON: Chest radiograph performed 09/26/2013  FINDINGS:  The lungs are well-aerated. Vascular congestion is noted, with  diffuse bilateral airspace opacities, possibly reflecting multifocal  pneumonia or pulmonary edema. Small bilateral pleural effusions  are  seen. No pneumothorax is identified.  The cardiomediastinal silhouette is mildly enlarged. No acute  osseous abnormalities are seen.  IMPRESSION:  Vascular congestion and mild cardiomegaly, with diffuse bilateral  airspace opacities, possibly reflecting multifocal pneumonia or  pulmonary edema. Small bilateral pleural effusions seen.   Assessment/Plan      Acute diastolic heart failure Net fluids:  She is down 11.2 liters since admission.   change to PO lasix today   She continues to diurese briskly  CXR show no acute abn.  Lungs are clear.     Acute respiratory failure Secondary to Acute diastolic CHF and Afib RVR.  Improved     HTN (hypertension)  BP controlled this morning.     Hyperlipidemia   Wound of left leg  Seen by Wound Care.  Una boot applied.    Bilateral moderate  carotid disease by doppler   Atrial fibrillation Her HR is very well controlled.  She has tuned up well.  Has diuresed 11 liters this week and is ready for TEE /CV which we will schedule tomorrow.  DC IV dilt.  Increase metoprolol     Mild aortic stenosis with normal LVF   Pneumonia  Fortaz and vancomycin started Monday   WBC decreased.     will give her 1 more day of IV antibiotic and start LEvaquin 750 mg a day starting tomorrow.     Thayer Headings, Brooke Bonito., MD, [facility] 09/30/2013, 8:37 AM [address]  [place] Pager [phone]

## 2013-09-30 NOTE — Progress Notes (Signed)
Patient on 5L of 02, SATS 99%, will wean down to 4 L. & Will continue to wean as tolerated. Monitoring closely. Elmarie Shiley R

## 2013-09-30 NOTE — Progress Notes (Addendum)
Patient states that she has not had a BM since before she was admitted on the 08/07. Per patient she is not passing gas, bowel sounds active.  Will follow up with MD. Roxan Hockey

## 2013-09-30 NOTE — Progress Notes (Signed)
Consent signed and obtained for Cardioversion procedure 08/14, consent placed in chart. Patient to be NPO at midnight tonight. Elmarie Shiley R

## 2013-10-01 ENCOUNTER — Inpatient Hospital Stay (HOSPITAL_COMMUNITY): Payer: Medicare Other | Admitting: Certified Registered"

## 2013-10-01 ENCOUNTER — Encounter (HOSPITAL_COMMUNITY)
Admission: EM | Disposition: A | Discharge status: Skilled Nursing Facility | Payer: Self-pay | Source: Home / Self Care | Attending: Cardiovascular Disease

## 2013-10-01 ENCOUNTER — Encounter (HOSPITAL_COMMUNITY): Payer: Medicare Other | Admitting: Certified Registered"

## 2013-10-01 ENCOUNTER — Encounter (HOSPITAL_COMMUNITY): Payer: Self-pay | Admitting: *Deleted

## 2013-10-01 HISTORY — PX: TEE WITHOUT CARDIOVERSION: SHX5443

## 2013-10-01 HISTORY — PX: CARDIOVERSION: SHX1299

## 2013-10-01 LAB — BASIC METABOLIC PANEL
ANION GAP: 12 (ref 5–15)
BUN: 8 mg/dL (ref 6–23)
CHLORIDE: 93 meq/L — AB (ref 96–112)
CO2: 32 meq/L (ref 19–32)
Calcium: 8.1 mg/dL — ABNORMAL LOW (ref 8.4–10.5)
Creatinine, Ser: 0.49 mg/dL — ABNORMAL LOW (ref 0.50–1.10)
GFR calc non Af Amer: 86 mL/min — ABNORMAL LOW (ref >90)
Glucose, Bld: 96 mg/dL (ref 70–99)
Potassium: 4 mEq/L (ref 3.7–5.3)
SODIUM: 137 meq/L (ref 137–147)

## 2013-10-01 SURGERY — CARDIOVERSION
Anesthesia: Monitor Anesthesia Care

## 2013-10-01 MED ORDER — SODIUM CHLORIDE 0.9 % IV SOLN: INTRAVENOUS | Status: DC

## 2013-10-01 MED ORDER — PROPOFOL INFUSION 10 MG/ML OPTIME
INTRAVENOUS | Status: DC | PRN
  Administered 2013-10-01: 180 ug/kg/min via INTRAVENOUS

## 2013-10-01 MED ORDER — BUTAMBEN-TETRACAINE-BENZOCAINE 2-2-14 % EX AERO
INHALATION_SPRAY | CUTANEOUS | Status: DC | PRN
  Administered 2013-10-01: 2 via TOPICAL

## 2013-10-01 NOTE — Progress Notes (Signed)
Patient: Julie Haas / Admit Date: 09/24/2013 / Date of Encounter: 10/01/2013, 11:32 AM   Subjective: Still feels tired. Breathing stable. Still with cough.   Objective: Telemetry: atrial fib rates 90s-130s Physical Exam: Blood pressure 126/74, pulse 116, temperature 98.5 F (36.9 C), temperature source Oral, resp. rate 18, height 5\' 2"  (1.575 m), weight 203 lb 7.8 oz (92.3 kg), SpO2 94.00%. General: Well developed, well nourished WF in no acute distress. Laying nearly flat in bed on her side Head: Normocephalic, atraumatic, sclera non-icteric, no xanthomas, nares are without discharge. Neck: JVP not elevated. Lungs: Bilateral crackles. No wheezing or rhonchi. Breathing is unlabored. Heart: Irregularly irregular, rate slightly elevated, S1 S2 without murmurs, rubs, or gallops.  Abdomen: Soft, non-tender, non-distended with normoactive bowel sounds. No rebound/guarding. Extremities: No clubbing or cyanosis. No edema. Distal pedal pulses are 2+ and equal bilaterally. Neuro: Alert and oriented X 3. Moves all extremities spontaneously. Psych:  Responds to questions appropriately with a normal affect.   Intake/Output Summary (Last 24 hours) at 10/01/13 1132 Last data filed at 10/01/13 0630  Gross per 24 hour  Intake    120 ml  Output   2400 ml  Net  -2280 ml    Inpatient Medications:  . apixaban  5 mg Oral BID  . furosemide  40 mg Oral Daily  . levofloxacin  750 mg Oral Daily  . losartan  50 mg Oral Daily  . metoprolol tartrate  50 mg Oral BID  . potassium chloride  20 mEq Oral Daily  . sodium chloride  3 mL Intravenous Q12H  . vitamin E  1,000 Units Oral Daily   Infusions:  . sodium chloride 10 mL/hr at 09/27/13 1119  . sodium chloride      Labs:  Recent Labs  09/30/13 0300 10/01/13 0410  NA 138 137  K 3.9 4.0  CL 94* 93*  CO2 33* 32  GLUCOSE 93 96  BUN 9 8  CREATININE 0.47* 0.49*  CALCIUM 8.2* 8.1*    2D Echo 09/24/13 - Left ventricle: The cavity size was  normal. Wall thickness was increased in a pattern of mild LVH. Systolic function was normal. The estimated ejection fraction was in the range of 55% to 60%. - Aortic valve: There was mild stenosis. Valve area (VTI): 1.34 cm^2. Valve area (Vmax): 1.23 cm^2. - Mitral valve: Calcified annulus. There was mild regurgitation. - Left atrium: The atrium was moderately dilated. - Atrial septum: No defect or patent foramen ovale was identified.   Radiology/Studies:  Dg Chest 2 View  09/30/2013   EXAM: CHEST  2 VIEW  COMPARISON:  None.  FINDINGS: The heart size and mediastinal contours are within normal limits. Both lungs are clear. The visualized skeletal structures are unremarkable.  IMPRESSION: No active cardiopulmonary disease.   Electronically Signed   By: Marin Olp M.D.   On: 09/30/2013 08:04   Dg Chest 2 View  09/28/2013   CLINICAL DATA:  Cough  EXAM: CHEST  2 VIEW  COMPARISON:  09/27/2013  FINDINGS: Cardiac shadow is stable. Patchy infiltrates are again noted bilaterally. Increasing small left effusion is seen. No new focal abnormality is noted.  IMPRESSION: Patchy bilateral infiltrates.   Electronically Signed   By: Inez Catalina M.D.   On: 09/28/2013 11:09   Dg Chest Port 1 View  09/27/2013   CLINICAL DATA:  Shortness of breath.  EXAM: PORTABLE CHEST - 1 VIEW  COMPARISON:  Chest radiograph performed 09/26/2013  FINDINGS: The lungs are well-aerated. Vascular  congestion is noted, with diffuse bilateral airspace opacities, possibly reflecting multifocal pneumonia or pulmonary edema. Small bilateral pleural effusions are seen. No pneumothorax is identified.  The cardiomediastinal silhouette is mildly enlarged. No acute osseous abnormalities are seen.  IMPRESSION: Vascular congestion and mild cardiomegaly, with diffuse bilateral airspace opacities, possibly reflecting multifocal pneumonia or pulmonary edema. Small bilateral pleural effusions seen.   Electronically Signed   By: Garald Balding M.D.    On: 09/27/2013 04:55    Assessment and Plan  1. Newly recognized atrial fibrillation with RVR 2. Acute diastolic CHF 3. HCAP, on abx 4. HTN 5. Acute resp failure due to #1/3, still requiring supplemental O2  6. HLD 7. Would of left leg, seen by wound care, unna boot applied 8. Mild AS with normal LV function 9. Carotid artery disease, missed f/u duplex due to this hospitalization - will need to r/s  -13.4L, -7lb diuresis. Continue Lasix, apixaban, ACEI, metoprolol, KCl. Switched to Levaquin today - day 5/8. Pending TEE/DCCV today. Appreciate care management help regarding SNF; will involve social work for actual placement per PT recommendation. The patient would like to go to [facility].  Recheck CBC in AM to ensure stability.   Signed, Melina Copa PA-C  Attending Note:   The patient was seen and examined.  Agree with assessment and plan as noted above.  Changes made to the above note as needed.  Ms. Ragen looks comfortable - still on O2 but able to lie flat without problems Ready for cardioversion   Ramond Dial., MD, [facility] 10/01/2013, 12:21 PM [address],  [place] Pager [phone]

## 2013-10-01 NOTE — Transfer of Care (Signed)
Immediate Anesthesia Transfer of Care Note  Patient: Julie Haas  Procedure(s) Performed: Procedure(s): CARDIOVERSION (N/A) TRANSESOPHAGEAL ECHOCARDIOGRAM (TEE) (N/A)  Patient Location: Endoscopy Unit  Anesthesia Type:MAC  Level of Consciousness: awake and alert   Airway & Oxygen Therapy: Patient Spontanous Breathing and Patient connected to nasal cannula oxygen  Post-op Assessment: Report given to PACU RN, Post -op Vital signs reviewed and stable and Patient moving all extremities X 4  Post vital signs: Reviewed and stable  Complications: No apparent anesthesia complications

## 2013-10-01 NOTE — H&P (View-Only) (Signed)
SUBJECTIVE:  She does not feel well.  Still SOB.     PHYSICAL EXAM Filed Vitals:   09/24/13 0637 09/24/13 1456 09/24/13 1958 09/25/13 0322  BP: 134/80 122/60 131/67 113/60  Pulse: 90 92 90 77  Temp: 98.4 F (36.9 C) 98.7 F (37.1 C) 98.8 F (37.1 C) 98.3 F (36.8 C)  TempSrc: Oral Oral Oral Oral  Resp: 30  20 18   Height: 5\' 2"  (1.575 m)     Weight: 210 lb 5.1 oz (95.4 kg)   205 lb 4 oz (93.1 kg)  SpO2: 95% 95% 96% 94%   General:  Chronically ill appearing Lungs:  Decreased breath sounds with scattered crackles Heart:  RRR Abdomen:  Positive bowel sounds, no rebound no guarding Extremities:  Trace edema   LABS: Lab Results  Component Value Date   TROPONINI <0.30 09/24/2013   Results for orders placed during the hospital encounter of 09/24/13 (from the past 24 hour(s))  CBC     Status: Abnormal   Collection Time    09/25/13  3:21 AM      Result Value Ref Range   WBC 11.1 (*) 4.0 - 10.5 K/uL   RBC 4.01  3.87 - 5.11 MIL/uL   Hemoglobin 12.7  12.0 - 15.0 g/dL   HCT 37.5  36.0 - 46.0 %   MCV 93.5  78.0 - 100.0 fL   MCH 31.7  26.0 - 34.0 pg   MCHC 33.9  30.0 - 36.0 g/dL   RDW 14.2  11.5 - 15.5 %   Platelets 306  150 - 400 K/uL  BASIC METABOLIC PANEL     Status: Abnormal   Collection Time    09/25/13  3:21 AM      Result Value Ref Range   Sodium 131 (*) 137 - 147 mEq/L   Potassium 3.9  3.7 - 5.3 mEq/L   Chloride 90 (*) 96 - 112 mEq/L   CO2 27  19 - 32 mEq/L   Glucose, Bld 106 (*) 70 - 99 mg/dL   BUN 17  6 - 23 mg/dL   Creatinine, Ser 0.59  0.50 - 1.10 mg/dL   Calcium 8.4  8.4 - 10.5 mg/dL   GFR calc non Af Amer 81 (*) >90 mL/min   GFR calc Af Amer >90  >90 mL/min   Anion gap 14  5 - 15  LIPID PANEL     Status: None   Collection Time    09/25/13  3:21 AM      Result Value Ref Range   Cholesterol 191  0 - 200 mg/dL   Triglycerides 48  <150 mg/dL   HDL 86  >39 mg/dL   Total CHOL/HDL Ratio 2.2     VLDL 10  0 - 40 mg/dL   LDL Cholesterol 95  0 - 99 mg/dL     Intake/Output Summary (Last 24 hours) at 09/25/13 1249 Last data filed at 09/25/13 0700  Gross per 24 hour  Intake    240 ml  Output      0 ml  Net    240 ml   ASSESSMENT AND PLAN:  ATRIAL FIB:   Patient is on schedule for TEE/DCCV on Monday.  On Eliquis.  Rate is OK on Cardizem IV .  Continue therapy.   HTN:   BP well controlled.   ACUTE DIASTOLIC HF:    I/O are incomplete.   Weight is down.   I suspect that the ongoing dyspnea is multifactorial.  I will start some oral diuretic however.     Jeneen Rinks [facility] 09/25/2013 12:49 PM

## 2013-10-01 NOTE — Op Note (Signed)
Procedure: Electrical Cardioversion Indications:  Atrial Fibrillation  Procedure Details:  Consent: Risks of procedure as well as the alternatives and risks of each were explained to the (patient/caregiver).  Consent for procedure obtained.  Time Out: Verified patient identification, verified procedure, site/side was marked, verified correct patient position, special equipment/implants available, medications/allergies/relevent history reviewed, required imaging and test results available.  Performed  Patient placed on cardiac monitor, pulse oximetry, supplemental oxygen as necessary.  Sedation given: Propofol 230 mg IV, Anesthesiology, Dr. Orene Desanctis Pacer pads placed anterior and posterior chest.  Cardioverted 1 time(s).  Cardioverted at 150J. Sync biphasic  Evaluation: Findings: Post procedure EKG shows: NSR with PACs Complications: None Patient did tolerate procedure well.  Time Spent Directly with the Patient:  73minutes   Ayush Boulet 10/01/2013, 1:16 PM

## 2013-10-01 NOTE — Anesthesia Postprocedure Evaluation (Signed)
  Anesthesia Post-op Note  Patient: Julie Haas  Procedure(s) Performed: Procedure(s): CARDIOVERSION (N/A) TRANSESOPHAGEAL ECHOCARDIOGRAM (TEE) (N/A)  Patient Location: Endoscopy Unit  Anesthesia Type:MAC  Level of Consciousness: awake and alert   Airway and Oxygen Therapy: Patient Spontanous Breathing and Patient connected to nasal cannula oxygen  Post-op Pain: none  Post-op Assessment: Post-op Vital signs reviewed, Patient's Cardiovascular Status Stable, Respiratory Function Stable, Patent Airway and No signs of Nausea or vomiting  Post-op Vital Signs: Reviewed and stable  Last Vitals:  Filed Vitals:   10/01/13 1325  BP: 87/57  Pulse: 71  Temp:   Resp: 34    Complications: No apparent anesthesia complications

## 2013-10-01 NOTE — Op Note (Signed)
INDICATIONS: atrial fibrillation  PROCEDURE:   Informed consent was obtained prior to the procedure. The risks, benefits and alternatives for the procedure were discussed and the patient comprehended these risks.  Risks include, but are not limited to, cough, sore throat, vomiting, nausea, somnolence, esophageal and stomach trauma or perforation, bleeding, low blood pressure, aspiration, pneumonia, infection, trauma to the teeth and death.    After a procedural time-out, the oropharynx was anesthetized with 20% benzocaine spray. The patient was given 230 mg IV propofol for deep sedation.   The transesophageal probe was inserted in the esophagus and stomach without difficulty and multiple views were obtained.  The patient was kept under observation until the patient left the procedure room.  The patient left the procedure room in stable condition.   Agitated microbubble saline contrast was not administered.  COMPLICATIONS:    There were no immediate complications.  FINDINGS:  Spontaneous echo contrast, but no thrombus in the LA appendage Mild to moderate AS due to degenerative changes Myxomatous mitral leaflets with bileaflet prolapse and 1-2+ mitral insufficiency  RECOMMENDATIONS:    Proceed with cardioversion  Time Spent Directly with the Patient:  60 minutes   Wilma Wuthrich 10/01/2013, 1:14 PM

## 2013-10-01 NOTE — Progress Notes (Signed)
Heart Failure Navigator Consult Note  Presentation: Julie Haas is a pleasant 80F with a history of HTN who presents with worsening shortness of breath. She was in her usual state of health earlier on 09/23/2013 but noted in the evening that she had difficulty breathing when lying down. Of note, she had noticed that her heart rate had been irregular intermittently through the course of the day.  Although she has had prior syncopal episodes, she has never had SOB like today. She is overall very active with clubs and her church groups. She had a prior history of PVCs but no confirmed AF per her recollection.  EMS was called given her symptoms and they noted that she was in AF with rates in the >150 range. She was given diltiazem en route. In the ED, she was given additional diltiazem and placed on an infusion with better control of her HR in the 110s-120s. Cardiology was consulted for additional evaluation and management of her AF-RVR.   Past Medical History  Diagnosis Date  . Hyperlipidemia   . Arrhythmia     PVC  . Retinal detachment   . Renal disorder   . Allergy   . Occlusion and stenosis of carotid artery without mention of cerebral infarction 03/24/2012  . Pneumonia, organism unspecified 11/192013  . Depressive disorder, not elsewhere classified 03/05/2011  . Other premature beats 03/05/2011  . Other seborrheic keratosis 11/20/2010  . Syncope and collapse 10/02/2010  . Palpitations 11/21/2009  . Unspecified essential hypertension 06/27/2009  . Cardiomegaly 06/27/2009  . Other generalized ischemic cerebrovascular disease 06/27/2009  . Dizziness and giddiness 06/27/2009  . Unspecified vitamin D deficiency 05/16/2009  . Family history of sudden cardiac death (SCD) 12-04-2008  . Rash and other nonspecific skin eruption 07/05/2008  . Edema 03/15/2008  . Obesity, unspecified 07/25/2006  . Varicose veins of lower extremities with inflammation 03/16/2003  . Shortness of breath     History    Social History  . Marital Status: Widowed    Spouse Name: N/A    Number of Children: N/A  . Years of Education: N/A   Occupational History  . Housewife    Social History Main Topics  . Smoking status: Never Smoker   . Smokeless tobacco: Never Used  . Alcohol Use: No  . Drug Use: No  . Sexual Activity: No   Other Topics Concern  . None   Social History Narrative   The patient is widowed, husband died 02-28-2011.  She has two children and two     grandchildren.     She never smoked cigarettes    Does not drink alcohol.    Drinks 2 cups caffeine coffee,    L[facility] at [facility] since 09/03/2004   Living Will   Exercise: walks daily          ECHO:Study Conclusions--09/24/13  - Left ventricle: The cavity size was normal. Wall thickness was increased in a pattern of mild LVH. Systolic function was normal. The estimated ejection fraction was in the range of 55% to 60%. - Aortic valve: There was mild stenosis. Valve area (VTI): 1.34 cm^2. Valve area (Vmax): 1.23 cm^2. - Mitral valve: Calcified annulus. There was mild regurgitation. - Left atrium: The atrium was moderately dilated. - Atrial septum: No defect or patent foramen ovale was identified.  Transthoracic echocardiography. M-mode, complete 2D, spectral Doppler, and color Doppler. Birthdate: Patient birthdate: Apr 03, 1925. Age: Patient is 80 yr old. Sex: Gender: female. BMI: 38.4 kg/m^2. Blood pressure: 122/60  Patient status: Inpatient. Study date: Study date: 09/24/2013. Study time: 04:53 PM. Location: Bedside.   BNP    Component Value Date/Time   PROBNP 2058.0* 09/28/2013 1000    Education Assessment and Provision:  Detailed education and instructions provided on heart failure disease management including the following:  Signs and symptoms of Heart Failure When to call the physician Importance of daily weights Low sodium diet Fluid restriction Medication management Anticipated future follow-up  appointments  Patient education given on each of the above topics.  Patient acknowledges understanding and acceptance of all instructions.  I spoke at length with Ms. Arrasmith regarding HF.  She currently lives at [facility].  She does not currently weigh daily however says that she can without any issue.  She also relates that she eats a low sodium diet that is provided at [facility].  She was not taking Lasix prior to admission.  She has been very active and says that she is rarely bored.    Education Materials:  "Living Better With Heart Failure" Booklet, Daily Weight Tracker Tool    High Risk Criteria for Readmission and/or Poor Patient Outcomes:   EF <30%- No  2 or more admissions in 6 months- No  Difficult social situation- No  Demonstrates medication noncompliance- No   Barriers of Care:  Knowledge   Discharge Planning:   Plans to return to [facility].  She will follow-up at [facility].

## 2013-10-01 NOTE — Progress Notes (Signed)
Pt picked up to be transported off to Endo for TEE. Delia Heady RN

## 2013-10-01 NOTE — Progress Notes (Signed)
Echocardiogram Echocardiogram Transesophageal has been performed.  Julie Haas 10/01/2013, 2:36 PM

## 2013-10-01 NOTE — Interval H&P Note (Signed)
History and Physical Interval Note:  10/01/2013 11:53 AM  [name]  has presented today for surgery, with the diagnosis of afib  The various methods of treatment have been discussed with the patient and family. After consideration of risks, benefits and other options for treatment, the patient has consented to  Procedure(s): CARDIOVERSION (N/A) TRANSESOPHAGEAL ECHOCARDIOGRAM (TEE) (N/A) as a surgical intervention .  The patient's history has been reviewed, patient examined, no change in status, stable for surgery.  I have reviewed the patient's chart and labs.  Questions were answered to the patient's satisfaction.     Julie Haas

## 2013-10-01 NOTE — H&P (View-Only) (Signed)
HPI The patient presents for follow up of HTN.  She had previous syncopal episode but has had no complaints of this for the last few visits. She did have a mechanical fall and injured her leg and has this wrap.  The patient denies any new symptoms such as chest discomfort, neck or arm discomfort. There has been no new shortness of breath, PND or orthopnea. There have been no reported palpitations, presyncope or syncope.  She does some activities and does some volunteer work.  Allergies  Allergen Reactions  . Codeine     Unknown  . Diuretic [Buchu-[place]sil[place]-Ch Grass-Hydran]     Unknown  . Lipitor [Atorvastatin Calcium]     Unknown  . Penicillins Other (See Comments)    unknown  . Pravachol     Unknown    Current Outpatient Prescriptions  Medication Sig Dispense Refill  . dextromethorphan (DELSYM) 30 MG/5ML liquid One tsp every 8-12 hours as needed to control cough  90 mL  0  . losartan (COZAAR) 50 MG tablet TAKE 1 TABLET ONCE DAILY.  30 tablet  0  . metoprolol tartrate (LOPRESSOR) 25 MG tablet TAKE 1 TABLET TWICE DAILY.  60 tablet  6   No current facility-administered medications for this visit.    Past Medical History  Diagnosis Date  . Hyperlipidemia   . Arrhythmia     PVC  . Retinal detachment   . Renal disorder   . Allergy   . Occlusion and stenosis of carotid artery without mention of cerebral infarction 03/24/2012  . Pneumonia, organism unspecified 11/192013  . Depressive disorder, not elsewhere classified 03/05/2011  . Other premature beats 03/05/2011  . Other seborrheic keratosis 11/20/2010  . Syncope and collapse 10/02/2010  . Palpitations 11/21/2009  . Unspecified essential hypertension 06/27/2009  . Cardiomegaly 06/27/2009  . Other generalized ischemic cerebrovascular disease 06/27/2009  . Dizziness and giddiness 06/27/2009  . Unspecified vitamin D deficiency 05/16/2009  . Family history of sudden cardiac death (SCD) 2008-12-16  . Rash and other nonspecific skin eruption  07/05/2008  . Edema 03/15/2008  . Obesity, unspecified 07/25/2006  . Varicose veins of lower extremities with inflammation 03/16/2003    Past Surgical History  Procedure Laterality Date  . Cholecystectomy  1983  . Cataract extraction Bilateral     Dr. [name]  . Tonsillectomy    . Keratosis    . Retinal detachment surgery Left     ROS:  As stated in the HPI and negative for all other systems.  PHYSICAL EXAM BP 152/84  Pulse 74  Ht 5\' 5"  (1.651 m)  Wt 199 lb (90.266 kg)  BMI 33.12 kg/m2 GENERAL:  Well appearing HEENT:  Pupils equal round and reactive, fundi not visualized, oral mucosa unremarkable NECK:  No jugular venous distention, waveform within normal limits, carotid upstroke brisk and symmetric, soft transmitted systolic murmur versus left carotid bruits, no thyromegaly LUNGS:  Clear to auscultation bilaterally LYMPHATICS:  Unremarkable BACK:  No CVA tenderness CHEST:  Unremarkable HEART:  PMI not displaced or sustained,S1 and S2 within normal limits, no S3, no S4, no clicks, no rubs, early peaking apical systolic murmur radiating out the aortic outflow tract, no diastolic murmurs ABD:  Flat, positive bowel sounds normal in frequency in pitch, no bruits, no rebound, no guarding, no midline pulsatile mass, no hepatomegaly, no splenomegaly EXT:  2 plus pulses throughout, no edema, no cyanosis no clubbing, left leg dressed   EKG:  Sinus rhythm, rate 75, axis within normal limits, intervals within normal  limits, no acute ST-T wave changes.  09/14/2013  ASSESSMENT AND PLAN   HTN (hypertension) -  Her BP is slightly elevated.  She will have this checked at home and let me know if it is trending upward.  It has been well controlled at previous appointments.  NSVT - She has had no symptomatic palpitations.  She will continue the beta blocker.   MURMUR - She has mild aortic sclerosis no further work up is suggested.   CAROTID STENOSIS - She has 60-79% bilateral stenosis with  a carotid Doppler done last in Feb.  I will repeat this study.    DYSLIPIDEMIA - She does not tolerate statins.  Her LDL was 148.  She will continue meds as listed.

## 2013-10-01 NOTE — Anesthesia Procedure Notes (Signed)
Procedure Name: MAC Date/Time: 10/01/2013 12:50 PM Performed by: Melina Copa, Sharonica Kraszewski R Pre-anesthesia Checklist: Patient identified, Timeout performed, Emergency Drugs available, Suction available and Patient being monitored Patient Re-evaluated:Patient Re-evaluated prior to inductionOxygen Delivery Method: Nasal cannula Intubation Type: IV induction Placement Confirmation: positive ETCO2 and breath sounds checked- equal and bilateral Dental Injury: Teeth and Oropharynx as per pre-operative assessment

## 2013-10-01 NOTE — Progress Notes (Signed)
Clinical Social Work Department BRIEF PSYCHOSOCIAL ASSESSMENT 10/01/2013  Patient:  Julie Haas, Julie Haas     Account Number:  [id number]     Admit date:  09/24/2013  Clinical Social Worker:  Adair Laundry  Date/Time:  09/30/2013 04:00 PM  Referred by:  Physician  Date Referred:  09/30/2013 Referred for  SNF Placement   Other Referral:   Interview type:  Patient Other interview type:    PSYCHOSOCIAL DATA Living Status:  FACILITY Admitted from facility:  [facility] Level of care:  Independent Living Primary support name:  Bonnita Levan Primary support relationship to patient:  CHILD, ADULT Degree of support available:   Pt has good support    CURRENT CONCERNS Current Concerns  Post-Acute Placement   Other Concerns:    SOCIAL WORK ASSESSMENT / PLAN CSW made aware that pt was admitted from facility. CSW visited pt room and pt informed CSW she lives at [facility]. CSW spoke with pt about recommendation to dc to [place] [facility] SNF. Pt said she would do whatever was necessary or being recommended. CSW offered to speak with family as well but pt declined.   Assessment/plan status:  Psychosocial Support/Ongoing Assessment of Needs Other assessment/ plan:   Information/referral to community resources:   None needed    PATIENT'S/FAMILY'S RESPONSE TO PLAN OF CARE: Pt agreeable to SNF       [place], [place]

## 2013-10-01 NOTE — Anesthesia Preprocedure Evaluation (Signed)
Anesthesia Evaluation  Patient identified by MRN, date of birth, ID band Patient awake    Reviewed: Allergy & Precautions, H&P , NPO status , Patient's Chart, lab work & pertinent test results, reviewed documented beta blocker date and time   Airway Mallampati: II TM Distance: >3 FB Neck ROM: Full    Dental  (+) Teeth Intact, Dental Advisory Given   Pulmonary shortness of breath,          Cardiovascular hypertension, Pt. on home beta blockers and Pt. on medications     Neuro/Psych    GI/Hepatic   Endo/Other    Renal/GU      Musculoskeletal   Abdominal   Peds  Hematology   Anesthesia Other Findings   Reproductive/Obstetrics                           Anesthesia Physical Anesthesia Plan  ASA: IV  Anesthesia Plan: MAC   Post-op Pain Management:    Induction: Intravenous  Airway Management Planned: Nasal Cannula and Natural Airway  Additional Equipment: None  Intra-op Plan:   Post-operative Plan: Extubation in OR  Informed Consent: I have reviewed the patients History and Physical, chart, labs and discussed the procedure including the risks, benefits and alternatives for the proposed anesthesia with the patient or authorized representative who has indicated his/her understanding and acceptance.   Dental advisory given  Plan Discussed with: CRNA, Anesthesiologist and Surgeon  Anesthesia Plan Comments:         Anesthesia Quick Evaluation

## 2013-10-01 NOTE — Progress Notes (Signed)
Pt returns back from Endo to room; pt Alert and verbally responsive. Placed back on telemetry; vitals taken and will continue to monitor. Francis Gaines Hurbert Duran RN.

## 2013-10-01 NOTE — Interval H&P Note (Signed)
History and Physical Interval Note:  10/01/2013 11:55 AM  Julie Haas  has presented today for surgery, with the diagnosis of afib  The various methods of treatment have been discussed with the patient and family. After consideration of risks, benefits and other options for treatment, the patient has consented to  Procedure(s): CARDIOVERSION (N/A) TRANSESOPHAGEAL ECHOCARDIOGRAM (TEE) (N/A) as a surgical intervention .  The patient's history has been reviewed, patient examined, no change in status, stable for surgery.  I have reviewed the patient's chart and labs.  Questions were answered to the patient's satisfaction.     Julie Haas

## 2013-10-02 LAB — BASIC METABOLIC PANEL
ANION GAP: 13 (ref 5–15)
BUN: 12 mg/dL (ref 6–23)
CHLORIDE: 93 meq/L — AB (ref 96–112)
CO2: 31 mEq/L (ref 19–32)
CREATININE: 0.53 mg/dL (ref 0.50–1.10)
Calcium: 8.8 mg/dL (ref 8.4–10.5)
GFR calc non Af Amer: 84 mL/min — ABNORMAL LOW (ref >90)
Glucose, Bld: 97 mg/dL (ref 70–99)
POTASSIUM: 4.1 meq/L (ref 3.7–5.3)
SODIUM: 137 meq/L (ref 137–147)

## 2013-10-02 LAB — CBC
HCT: 39.1 % (ref 36.0–46.0)
HEMOGLOBIN: 13 g/dL (ref 12.0–15.0)
MCH: 31.6 pg (ref 26.0–34.0)
MCHC: 33.2 g/dL (ref 30.0–36.0)
MCV: 94.9 fL (ref 78.0–100.0)
Platelets: 371 10*3/uL (ref 150–400)
RBC: 4.12 MIL/uL (ref 3.87–5.11)
RDW: 13.4 % (ref 11.5–15.5)
WBC: 9.3 10*3/uL (ref 4.0–10.5)

## 2013-10-02 MED ORDER — APIXABAN 5 MG PO TABS: 5.0000 mg | ORAL_TABLET | Freq: Two times a day (BID) | ORAL | Status: DC

## 2013-10-02 MED ORDER — POTASSIUM CHLORIDE CRYS ER 20 MEQ PO TBCR: 20.0000 meq | EXTENDED_RELEASE_TABLET | Freq: Every day | ORAL | Status: DC

## 2013-10-02 MED ORDER — FUROSEMIDE 40 MG PO TABS: 40.0000 mg | ORAL_TABLET | Freq: Every day | ORAL | Status: DC

## 2013-10-02 MED ORDER — FUROSEMIDE 10 MG/ML IJ SOLN: 40.0000 mg | Freq: Once | INTRAMUSCULAR | Status: DC

## 2013-10-02 MED ORDER — LEVOFLOXACIN 750 MG PO TABS: 750.0000 mg | ORAL_TABLET | Freq: Every day | ORAL | Status: DC

## 2013-10-02 MED ORDER — FUROSEMIDE 10 MG/ML IJ SOLN
10.0000 mg | Freq: Once | INTRAMUSCULAR | Status: AC
  Administered 2013-10-02: 10 mg via INTRAVENOUS
  Filled 2013-10-02: qty 2

## 2013-10-02 NOTE — Clinical Social Work Placement (Signed)
Clinical Social Work Department CLINICAL SOCIAL WORK PLACEMENT NOTE 10/02/2013  Patient:  EMMAMARIE, Julie Haas  Account Number:  [id number] Admit date:  09/24/2013  Clinical Social Worker:  Wylene Men  Date/time:  10/02/2013 03:43 PM  Clinical Social Work is seeking post-discharge placement for this patient at the following level of care:   SKILLED NURSING   (*CSW will update this form in Epic as items are completed)     Patient/family provided with [facility] Department of Clinical Social Work's list of facilities offering this level of care within the geographic area requested by the patient (or if unable, by the patient's family).    Patient/family informed of their freedom to choose among providers that offer the needed level of care, that participate in Medicare, Medicaid or managed care program needed by the patient, have an available bed and are willing to accept the patient.    Patient/family informed of [facility]' ownership interest in [facility], as well as of the fact that they are under no obligation to receive care at this facility.  PASARR submitted to EDS on unknown previously completed  PASARR number received on unknown previously completed  FL2 transmitted to all facilities in geographic area requested by pt/family on  Unknown previously completed FL2 transmitted to all facilities within larger geographic area on   Patient informed that his/her managed care company has contracts with or will negotiate with  certain facilities, including the following:     Patient/family informed of bed offers received:  N/a pt from [place] at [place] Patient chooses bed at [place]  Physician recommends and patient chooses bed at    Patient to be transferred to [place] on  10/02/2013 Patient to be transferred to facility by [facility] Patient and family notified of transfer on 10/02/2013 Name of family member  notified:  ET, pt son  The following physician request were entered in Epic:   Additional Comments: Nonnie Done, [place] [phone] (weekend coverage)  Clinical Social Work

## 2013-10-02 NOTE — Progress Notes (Signed)
Patient: Julie Haas / Admit Date: 09/24/2013 / Date of Encounter: 10/02/2013, 11:21 AM   Subjective: Still feels tired. Breathing stable. Still with cough.   Objective: Telemetry: atrial fib rates 90s-130s Physical Exam: Blood pressure 118/52, pulse 81, temperature 97.8 F (36.6 C), temperature source Oral, resp. rate 18, height 5\' 2"  (1.575 m), weight 203 lb 0.7 oz (92.1 kg), SpO2 90.00%. General: Well developed, well nourished WF in no acute distress. Laying nearly flat in bed on her side Head: Normocephalic, atraumatic, sclera non-icteric, no xanthomas, nares are without discharge. Neck: JVP not elevated. Lungs: few rales, right base.  Heart: Irregularly irregular, rate slightly elevated, S1 S2 without murmurs, rubs, or gallops.  Abdomen: Soft, non-tender, non-distended with normoactive bowel sounds. No rebound/guarding. Extremities: No clubbing or cyanosis. No edema. Distal pedal pulses are 2+ and equal bilaterally. Neuro: Alert and oriented X 3. Moves all extremities spontaneously. Psych:  Responds to questions appropriately with a normal affect.   Intake/Output Summary (Last 24 hours) at 10/02/13 1121 Last data filed at 10/02/13 0815  Gross per 24 hour  Intake    443 ml  Output    950 ml  Net   -507 ml    Inpatient Medications:  . apixaban  5 mg Oral BID  . furosemide  40 mg Oral Daily  . levofloxacin  750 mg Oral Daily  . losartan  50 mg Oral Daily  . metoprolol tartrate  50 mg Oral BID  . potassium chloride  20 mEq Oral Daily  . sodium chloride  3 mL Intravenous Q12H  . vitamin E  1,000 Units Oral Daily   Infusions:  . sodium chloride 10 mL/hr at 09/27/13 1119  . sodium chloride      Labs:  Recent Labs  10/01/13 0410 10/02/13 0352  NA 137 137  K 4.0 4.1  CL 93* 93*  CO2 32 31  GLUCOSE 96 97  BUN 8 12  CREATININE 0.49* 0.53  CALCIUM 8.1* 8.8    2D Echo 09/24/13 - Left ventricle: The cavity size was normal. Wall thickness was increased in a  pattern of mild LVH. Systolic function was normal. The estimated ejection fraction was in the range of 55% to 60%. - Aortic valve: There was mild stenosis. Valve area (VTI): 1.34 cm^2. Valve area (Vmax): 1.23 cm^2. - Mitral valve: Calcified annulus. There was mild regurgitation. - Left atrium: The atrium was moderately dilated. - Atrial septum: No defect or patent foramen ovale was identified.   Radiology/Studies:  Dg Chest 2 View  09/30/2013   EXAM: CHEST  2 VIEW  COMPARISON:  None.  FINDINGS: The heart size and mediastinal contours are within normal limits. Both lungs are clear. The visualized skeletal structures are unremarkable.  IMPRESSION: No active cardiopulmonary disease.   Electronically Signed   By: Marin Olp M.D.   On: 09/30/2013 08:04   Dg Chest 2 View  09/28/2013   CLINICAL DATA:  Cough  EXAM: CHEST  2 VIEW  COMPARISON:  09/27/2013  FINDINGS: Cardiac shadow is stable. Patchy infiltrates are again noted bilaterally. Increasing small left effusion is seen. No new focal abnormality is noted.  IMPRESSION: Patchy bilateral infiltrates.   Electronically Signed   By: Inez Catalina M.D.   On: 09/28/2013 11:09   Dg Chest Port 1 View  09/27/2013   CLINICAL DATA:  Shortness of breath.  EXAM: PORTABLE CHEST - 1 VIEW  COMPARISON:  Chest radiograph performed 09/26/2013  FINDINGS: The lungs are well-aerated. Vascular congestion is  noted, with diffuse bilateral airspace opacities, possibly reflecting multifocal pneumonia or pulmonary edema. Small bilateral pleural effusions are seen. No pneumothorax is identified.  The cardiomediastinal silhouette is mildly enlarged. No acute osseous abnormalities are seen.  IMPRESSION: Vascular congestion and mild cardiomegaly, with diffuse bilateral airspace opacities, possibly reflecting multifocal pneumonia or pulmonary edema. Small bilateral pleural effusions seen.   Electronically Signed   By: Garald Balding M.D.   On: 09/27/2013 04:55    Assessment and Plan   1. Newly recognized atrial fibrillation with RVR 2. Acute diastolic CHF 3. HCAP, on abx 4. HTN 5. Acute resp failure due to #1/3, still requiring supplemental O2  6. HLD 7. Would of left leg, seen by wound care, unna boot applied 8. Mild AS with normal LV function 9. Carotid artery disease, missed f/u duplex due to this hospitalization - will need to r/s  -13.4L, -7lb diuresis. Continue Lasix, apixaban, ACEI, metoprolol, KCl. Switched to Levaquin today - day 5/8. Pending TEE/DCCV today. Appreciate care management help regarding SNF; will involve social work for actual placement per PT recommendation. The patient would like to go to [facility].  Recheck CBC in AM to ensure stability.   Signed, Melina Copa PA-C  Attending Note:   The patient was seen and examined.  Agree with assessment and plan as noted above.  Changes made to the above note as needed.  [name] looks comfortable -   She was cardioverted yesterday and has maintained sinus rhythm. She feels quite well. She's eager to go home.  She's currently on Levaquin for presumed pneumonia. She'll need 5 more days of Levaquin.  Thayer Headings, Brooke Bonito., MD, [facility] 10/02/2013, 11:21 AM [address]  [place] Pager [phone]

## 2013-10-02 NOTE — Clinical Social Work Note (Addendum)
CSW was notified that pt has been discharged.  Pt states son has set her up with SNF at [facility].  CSW will assist with dc.  Nonnie Done, [place] [phone]  Clinical Social Work

## 2013-10-02 NOTE — Discharge Summary (Signed)
Physician Discharge Summary     Patient ID: Julie Haas MRN: [id number] DOB/AGE: [birth year] 80 y.o.  Admit date: 09/24/2013 Discharge date: 10/02/2013  Admission Diagnoses:    Acute diastolic heart failure  Discharge Diagnoses:  Principal Problem:   Acute diastolic heart failure Active Problems:   HTN (hypertension)   Hyperlipidemia   Wound of left leg   Bilateral moderate carotid disease by doppler   Atrial fibrillation   Mild aortic stenosis with normal LVF   Pneumonia   Acute resp failure     Discharged Condition: stable  Hospital Course:   The patient is a pleasant 80F with a history of HTN who presents with worsening shortness of breath. She was in her usual state of health earlier on 09/23/2013 but noted in the evening that she had difficulty breathing when lying down. Of note, she noticed that her heart rate was irregular intermittently through the course of the day.  Although she had prior syncopal episodes, she has never had SOB like today. She is overall very active with clubs and her church groups. She had a prior history of PVCs but no confirmed AF per her recollection.   EMS was called given her symptoms and they noted that she was in AF with rates in the >150 range. She was given diltiazem en route. In the ED, she was given additional diltiazem and placed on an infusion with better control of her HR in the 110s-120s. Cardiology was consulted for additional evaluation and management of her AF-RVR.  She was admitted and continue on IV dilt.  IV heparin added but later changed to Eliquis.  She was given 20mg  of IV lasix for volume overload.  She ultimately diuresed -14L net.  She was scheduled for TEE/DCCV but was not ready on Aug 10.  The procedure was rescheduled and completed on Aug 14.  She tolerated the procedures well.  IV dilt stopped and PO lopressor was added.   Fortaz and vancomycin started Monday, Aug 10 for PNA.  WBC decreased. She was changed to PO Levaquin 750  mg a day which will continue for five days after discharge.  The patient was seen by Dr. Acie Fredrickson who felt she was stable for DC to SNF.      Consults: OT/PT   Significant Diagnostic Studies:   Echocardiogram, Study Conclusions  - Left ventricle: The cavity size was normal. Wall thickness was increased in a pattern of mild LVH. Systolic function was normal. The estimated ejection fraction was in the range of 55% to 60%. - Aortic valve: There was mild stenosis. Valve area (VTI): 1.34 cm^2. Valve area (Vmax): 1.23 cm^2. - Mitral valve: Calcified annulus. There was mild regurgitation. - Left atrium: The atrium was moderately dilated. - Atrial septum: No defect or patent foramen ovale was identified.  09/27/13,  PORTABLE CHEST - 1 VIEW  COMPARISON: Chest radiograph performed 09/26/2013  FINDINGS:  The lungs are well-aerated. Vascular congestion is noted, with  diffuse bilateral airspace opacities, possibly reflecting multifocal  pneumonia or pulmonary edema. Small bilateral pleural effusions are  seen. No pneumothorax is identified.  The cardiomediastinal silhouette is mildly enlarged. No acute  osseous abnormalities are seen.  IMPRESSION:  Vascular congestion and mild cardiomegaly, with diffuse bilateral  airspace opacities, possibly reflecting multifocal pneumonia or  pulmonary edema. Small bilateral pleural effusions seen.   09/30/13, CHEST 2 VIEW  COMPARISON: None.  FINDINGS:  The heart size and mediastinal contours are within normal limits.  Both lungs are clear. The  visualized skeletal structures are  unremarkable.  IMPRESSION:  No active cardiopulmonary disease.   Treatments: See above  Discharge Exam: Blood pressure 118/52, pulse 81, temperature 97.8 F (36.6 C), temperature source Oral, resp. rate 18, height 5\' 2"  (1.575 m), weight 203 lb 0.7 oz (92.1 kg), SpO2 90.00%.   Disposition: 01-Home or Self Care  Discharge Instructions   Diet - low sodium heart healthy     Complete by:  As directed      Increase activity slowly    Complete by:  As directed             Medication List         apixaban 5 MG Tabs tablet  Commonly known as:  ELIQUIS  Take 1 tablet (5 mg total) by mouth 2 (two) times daily.     furosemide 40 MG tablet  Commonly known as:  LASIX  Take 1 tablet (40 mg total) by mouth daily.     levofloxacin 750 MG tablet  Commonly known as:  LEVAQUIN  Take 1 tablet (750 mg total) by mouth daily.     losartan 50 MG tablet  Commonly known as:  COZAAR  Take 50 mg by mouth daily.     metoprolol tartrate 25 MG tablet  Commonly known as:  LOPRESSOR  Take 25 mg by mouth 2 (two) times daily.     potassium chloride SA 20 MEQ tablet  Commonly known as:  K-DUR,KLOR-CON  Take 1 tablet (20 mEq total) by mouth daily.     vitamin E 1000 UNIT capsule  Take 1,000 Units by mouth daily.           Follow-up Information   Follow up with Minus Breeding, MD. (The office will call you to make an appointment for 1-2 weeks. If you do not hear from them by Tuesday please contact them)    Specialty:  Cardiology   Contact information:   [address] [place] [address] [phone]      Greater than 30 minutes was spent completing the patient's discharge.   SignedTarri Fuller, [place] 10/02/2013, 1:54 PM

## 2013-10-04 ENCOUNTER — Telehealth: Payer: Self-pay

## 2013-10-04 ENCOUNTER — Encounter (HOSPITAL_COMMUNITY): Payer: Self-pay | Admitting: Cardiovascular Disease

## 2013-10-04 ENCOUNTER — Telehealth (HOSPITAL_COMMUNITY): Payer: Self-pay | Admitting: *Deleted

## 2013-10-04 NOTE — Telephone Encounter (Signed)
[facility] clinic nurse called, patient is in skill after d/c from hospital for Cardioversion. Patient walker over to her apartment today. Wants to be discharged from skill today to go back to her apartment. [facility] or Dr. Nyoka Cowden has not seen her yet, one of them need to see her before they can d/c her back to her apartment.

## 2013-10-04 NOTE — Discharge Summary (Signed)
Attending Note:   The patient was seen and examined.  Agree with assessment and plan as noted above.  Changes made to the above note as needed.  See my note from day of discharge.  Thayer Headings, Brooke Bonito., MD, [facility] 10/04/2013, 5:47 PM [address],  [place] Pager [phone]

## 2013-10-05 ENCOUNTER — Telehealth: Payer: Self-pay | Admitting: Cardiology

## 2013-10-05 ENCOUNTER — Encounter: Payer: Self-pay | Admitting: Nurse Practitioner

## 2013-10-05 ENCOUNTER — Non-Acute Institutional Stay (SKILLED_NURSING_FACILITY): Payer: Medicare Other | Admitting: Nurse Practitioner

## 2013-10-05 DIAGNOSIS — R319 Hematuria, unspecified: Secondary | ICD-10-CM

## 2013-10-05 DIAGNOSIS — I10 Essential (primary) hypertension: Secondary | ICD-10-CM

## 2013-10-05 DIAGNOSIS — I8312 Varicose veins of left lower extremity with inflammation: Secondary | ICD-10-CM

## 2013-10-05 DIAGNOSIS — I359 Nonrheumatic aortic valve disorder, unspecified: Secondary | ICD-10-CM

## 2013-10-05 DIAGNOSIS — I5031 Acute diastolic (congestive) heart failure: Secondary | ICD-10-CM

## 2013-10-05 DIAGNOSIS — I4891 Unspecified atrial fibrillation: Secondary | ICD-10-CM

## 2013-10-05 DIAGNOSIS — J189 Pneumonia, unspecified organism: Secondary | ICD-10-CM

## 2013-10-05 DIAGNOSIS — I480 Paroxysmal atrial fibrillation: Secondary | ICD-10-CM

## 2013-10-05 DIAGNOSIS — I8310 Varicose veins of unspecified lower extremity with inflammation: Secondary | ICD-10-CM

## 2013-10-05 DIAGNOSIS — I350 Nonrheumatic aortic (valve) stenosis: Secondary | ICD-10-CM

## 2013-10-05 DIAGNOSIS — S81802S Unspecified open wound, left lower leg, sequela: Secondary | ICD-10-CM

## 2013-10-05 DIAGNOSIS — IMO0002 Reserved for concepts with insufficient information to code with codable children: Secondary | ICD-10-CM

## 2013-10-05 DIAGNOSIS — R55 Syncope and collapse: Secondary | ICD-10-CM

## 2013-10-05 NOTE — Assessment & Plan Note (Addendum)
Has course of 5 day Levaquin 750mg  daily to be completed since 10/01/13 when she was discharged from hospital. CXR 10/01/13 Hospital showed no acute cardiopulmonary process. Repeat CXR and CBC

## 2013-10-05 NOTE — Assessment & Plan Note (Signed)
Left lower leg since Nov 2014-currently compression wrap-seen at [facility] weekly.

## 2013-10-05 NOTE — Telephone Encounter (Signed)
Pt. Heart rate still at 100 and in A Fibb ;

## 2013-10-05 NOTE — Telephone Encounter (Signed)
New message           Pt is still in atrial fib with pulse over 100

## 2013-10-05 NOTE — Assessment & Plan Note (Signed)
The patient stated no further bloody urine since Foley removed. Obtain UA C/S to r/o UTI

## 2013-10-05 NOTE — Assessment & Plan Note (Signed)
The patient stated she has has x2 episodes in the past.

## 2013-10-05 NOTE — Assessment & Plan Note (Signed)
2/5 left sternal border systolic murmurs appreciated today.

## 2013-10-05 NOTE — Progress Notes (Signed)
Patient ID: Julie Haas, female   DOB: [birth year], 80 y.o.   MRN: [id number]    Code Status: DNR, living will.   Allergies  Allergen Reactions  . Codeine     Unknown  . Diuretic [Buchu-Cornsilk-Ch Grass-Hydran]     Unknown  . Lipitor [Atorvastatin Calcium]     Unknown  . Penicillins Other (See Comments)    unknown  . Pravachol     Unknown    Chief Complaint  Patient presents with  . Medical Management of Chronic Issues  . Acute Visit    hematuria, tachycardia, BLE edema, CHF,  new onset Afib.   Marland Kitchen Hospitalization Follow-up    HPI: Patient is a 80 y.o. female seen in the SNF at [facility] today for evaluation of hematuria, hospital f/u,  and other chronic medical conditions.     Hospitalized 09/24/2013-10/02/2013 for Acute diastolic heart failure HTN (hypertension) Hyperlipidemia Wound of left leg(since Nov 2014-f/u [place]) Bilateral moderate carotid disease by doppler Atrial fibrillation-conversion 10/01/13 Mild aortic stenosis with normal LVF Pneumonia(Levaquin 750mg  daily) Acute resp failure     Presented ED with SOB. She had a prior history of PVCs but no confirmed AF per her recollection. She was noted in AF with rates in the >150 range. She was given diltiazem en route. In the ED, she was given additional diltiazem and placed on an infusion with better control of her HR in the 110s-120s. She was admitted to hospital for AF-RVR and continue on IV dilt. IV heparin added but later changed to Eliquis. She was given 20mg  of IV lasix for volume overload. She ultimately diuresed -14L net. TEE/DCCV was completed on Aug 14. Fortaz and vancomycin started Monday, Aug 10 for PNA. WBC decreased. She was changed to PO Levaquin 750 mg a day x 5 days(dc 10/07/13)   Problem List Items Addressed This Visit   HTN (hypertension) - Primary (Chronic)     Controlled. 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 [facility].     Wound of left leg (Chronic)     Left lower leg since Nov 2014-currently compression wrap-seen at [facility] weekly.     Mild aortic stenosis with normal LVF (Chronic)     2/5 left sternal border systolic murmurs appreciated today.     Syncope     The patient stated she has has x2 episodes in the past.     Atrial fibrillation     New onset. Had cardioversion. HR 110s and irregular upon my examination today. EKG to evaluate further. Takes Metoprolol 25mg  bid. Eliquis 5mg  bid for PE/DVT risk reduction. Update TSH    Acute diastolic heart failure     Takes Furosemide 40mg  daily. Echocardiogram in hospital showed EF 55-60%. Will obtain BNP and CMP    Pneumonia     Has course of 5 day Levaquin 750mg  daily to be completed since 10/01/13 when she was discharged from hospital. CXR 10/01/13 Hospital showed no acute cardiopulmonary process. Repeat CXR and CBC    Hematuria     The patient stated no further bloody urine since Foley removed. Obtain UA C/S to r/o UTI       Review of Systems:  Review of Systems  Constitutional: Negative for fever, chills, weight loss, malaise/fatigue and diaphoresis.  HENT: Positive  for hearing loss. Negative for congestion, ear discharge, ear pain, nosebleeds, sore throat and tinnitus.   Eyes: Negative for blurred vision, double vision, photophobia, pain, discharge and redness.  Respiratory: Positive for cough. Negative for hemoptysis, sputum production, shortness of breath, wheezing and stridor.        Hacking  Cardiovascular: Positive for leg swelling and PND. Negative for chest pain, palpitations, orthopnea and claudication.       BLE L>R  Gastrointestinal: Negative for heartburn, nausea, vomiting, abdominal pain, diarrhea, constipation, blood in stool and melena.  Genitourinary: Negative for dysuria, urgency, frequency, hematuria and  flank pain.  Musculoskeletal: Negative for back pain, falls, joint pain, myalgias and neck pain.       No further hematuria since Foley Catheter removed.   Skin: Negative for itching and rash.       Left lower leg wound since Nov 2014-f/u [place] weekly.   Neurological: Negative for dizziness, tingling, tremors, sensory change, speech change, focal weakness, seizures, loss of consciousness, weakness and headaches.  Endo/Heme/Allergies: Negative for environmental allergies and polydipsia. Does not bruise/bleed easily.  Psychiatric/Behavioral: Negative for depression, suicidal ideas, hallucinations, memory loss and substance abuse. The patient is not nervous/anxious and does not have insomnia.      Past Medical History  Diagnosis Date  . Hyperlipidemia   . Arrhythmia     PVC  . Retinal detachment   . Renal disorder   . Allergy   . Occlusion and stenosis of carotid artery without mention of cerebral infarction 03/24/2012  . Pneumonia, organism unspecified 11/192013  . Depressive disorder, not elsewhere classified 03/05/2011  . Other premature beats 03/05/2011  . Other seborrheic keratosis 11/20/2010  . Syncope and collapse 10/02/2010  . Palpitations 11/21/2009  . Unspecified essential hypertension 06/27/2009  . Cardiomegaly 06/27/2009  . Other generalized ischemic cerebrovascular disease 06/27/2009  . Dizziness and giddiness 06/27/2009  . Unspecified vitamin D deficiency 05/16/2009  . Family history of sudden cardiac death (SCD) 2008-11-30  . Rash and other nonspecific skin eruption 07/05/2008  . Edema 03/15/2008  . Obesity, unspecified 07/25/2006  . Varicose veins of lower extremities with inflammation 03/16/2003  . Shortness of breath    Past Surgical History  Procedure Laterality Date  . Cholecystectomy  1983  . Cataract extraction Bilateral     Dr. [name]  . Tonsillectomy    . Keratosis    . Retinal detachment surgery Left   . Cardioversion N/A 10/01/2013    Procedure:  CARDIOVERSION;  Surgeon: Sanda Klein, MD;  Location: [facility] ENDOSCOPY;  Service: Cardiovascular;  Laterality: N/A;  . Tee without cardioversion N/A 10/01/2013    Procedure: TRANSESOPHAGEAL ECHOCARDIOGRAM (TEE);  Surgeon: Sanda Klein, MD;  Location: [facility] ENDOSCOPY;  Service: Cardiovascular;  Laterality: N/A;   Social History:   reports that she has never smoked. She has never used smokeless tobacco. She reports that she does not drink alcohol or use illicit drugs.  Family History  Problem Relation Age of Onset  . Arrhythmia      Long QT in first degree relatives,  . Heart attack Father   . Heart disease Father   . Heart attack Brother   . Heart disease Brother   . Pneumonia Mother     Medications: Patient's Medications  New Prescriptions   No medications on file  Previous Medications   APIXABAN (ELIQUIS) 5 MG TABS TABLET    Take 1 tablet (5 mg total) by mouth 2 (two) times daily.   FUROSEMIDE (LASIX) 40 MG  TABLET    Take 1 tablet (40 mg total) by mouth daily.   LEVOFLOXACIN (LEVAQUIN) 750 MG TABLET    Take 1 tablet (750 mg total) by mouth daily.   LOSARTAN (COZAAR) 50 MG TABLET    Take 50 mg by mouth daily.   METOPROLOL TARTRATE (LOPRESSOR) 25 MG TABLET    Take 25 mg by mouth 2 (two) times daily.   POTASSIUM CHLORIDE SA (K-DUR,KLOR-CON) 20 MEQ TABLET    Take 1 tablet (20 mEq total) by mouth daily.   VITAMIN E 1000 UNIT CAPSULE    Take 1,000 Units by mouth daily.  Modified Medications   No medications on file  Discontinued Medications   No medications on file     Physical Exam: Physical Exam  Constitutional:  overwewight  HENT:  Right Ear: External ear normal.  Left Ear: External ear normal.  Nose: Nose normal.  Eyes:  Corrective lenses.  Neck: Normal range of motion. Neck supple. No JVD present. No tracheal deviation present. No thyromegaly present.  Cardiovascular: Intact distal pulses.  Exam reveals no gallop and no friction rub.   Murmur heard. Varicose leg veins.  Trace edema LLE>RLE  Pulmonary/Chest: No respiratory distress. She has no wheezes. She has rales (bronchial rattle).  Moist rales posterior lower lungs.   Abdominal: Soft. Bowel sounds are normal. She exhibits no distension and no mass. There is no tenderness.  Musculoskeletal: Normal range of motion. She exhibits edema. She exhibits no tenderness.  Trace BLE L>R  Lymphadenopathy:    She has no cervical adenopathy.  Neurological: She is alert. No cranial nerve deficit. Coordination normal.  Skin: No rash noted. No erythema. No pallor.  Open area is about 89mm by 14 mm. Some swelling of the leg above this. Small hematoma at superior end of the injury.  Psychiatric: She has a normal mood and affect. Her behavior is normal. Judgment and thought content normal.    Filed Vitals:   10/05/13 1011  BP: 116/62  Pulse: 112  Temp: 98.8 F (37.1 C)  TempSrc: Tympanic  Resp: 16      Labs reviewed: Basic Metabolic Panel:  Recent Labs  09/24/13 0834  09/30/13 0300 10/01/13 0410 10/02/13 0352  NA  --   < > 138 137 137  K  --   < > 3.9 4.0 4.1  CL  --   < > 94* 93* 93*  CO2  --   < > 33* 32 31  GLUCOSE  --   < > 93 96 97  BUN  --   < > 9 8 12   CREATININE  --   < > 0.47* 0.49* 0.53  CALCIUM  --   < > 8.2* 8.1* 8.8  TSH 2.580  --   --   --   --   < > = values in this interval not displayed. Liver Function Tests:  Recent Labs  03/22/13 09/24/13 0200  AST 17 44*  ALT 9 39*  ALKPHOS 88 83  BILITOT  --  0.5  PROT  --  8.0  ALBUMIN  --  3.7   No results found for this basename: LIPASE, AMYLASE,  in the last 8760 hours No results found for this basename: AMMONIA,  in the last 8760 hours CBC:  Recent Labs  09/27/13 0512 09/28/13 0348 10/02/13 0352  WBC 11.5* 9.6 9.3  NEUTROABS 9.1* 7.0  --   HGB 13.7 11.8* 13.0  HCT 39.5 34.8* 39.1  MCV 92.1 92.6 94.9  PLT 327 299 371   Lipid Panel:  Recent Labs  03/22/13 09/25/13 0321  CHOL 229* 191  HDL 63 86  LDLCALC 148 95    TRIG 92 48  CHOLHDL  --  2.2    Past Procedures:  09/30/13 CXR  IMPRESSION: No active cardiopulmonary disease.  10/01/13   Echocardiogram transesophageal                             LV EF: 55% -   60%        Assessment/Plan HTN (hypertension) Controlled. Takes Furosemide 40mg  qd, Losartan 50mg  daily, and Metoprolol 25mg  bid  Varicose veins of lower extremities with inflammation Chronic, left lower leg wound-slow healing since Nov 2014. [facility].   Wound of left leg Left lower leg since Nov 2014-currently compression wrap-seen at [facility] weekly.   Syncope The patient stated she has has x2 episodes in the past.   Pneumonia Has course of 5 day Levaquin 750mg  daily to be completed since 10/01/13 when she was discharged from hospital. CXR 10/01/13 Hospital showed no acute cardiopulmonary process. Repeat CXR and CBC  Acute diastolic heart failure Takes Furosemide 40mg  daily. Echocardiogram in hospital showed EF 55-60%. Will obtain BNP and CMP  Atrial fibrillation New onset. Had cardioversion. HR 110s and irregular upon my examination today. EKG to evaluate further. Takes Metoprolol 25mg  bid. Eliquis 5mg  bid for PE/DVT risk reduction. Update TSH  Mild aortic stenosis with normal LVF 2/5 left sternal border systolic murmurs appreciated today.   Hematuria The patient stated no further bloody urine since Foley removed. Obtain UA C/S to r/o UTI    Family/ Staff Communication: observe the patient.   Goals of Care: SNF  Labs/tests ordered: EKG, CXR, CBC, CMP, TSH, UA C/S, BNP

## 2013-10-05 NOTE — Assessment & Plan Note (Signed)
Chronic, left lower leg wound-slow healing since Nov 2014. [facility].

## 2013-10-05 NOTE — Assessment & Plan Note (Signed)
Takes Furosemide 40mg  daily. Echocardiogram in hospital showed EF 55-60%. Will obtain BNP and CMP

## 2013-10-05 NOTE — Assessment & Plan Note (Addendum)
New onset. Had cardioversion. HR 110s and irregular upon my examination today. EKG to evaluate further. Takes Metoprolol 25mg  bid. Eliquis 5mg  bid for PE/DVT risk reduction. Update TSH

## 2013-10-05 NOTE — Assessment & Plan Note (Signed)
Controlled. Takes Furosemide 40mg  qd, Losartan 50mg  daily, and Metoprolol 25mg  bid

## 2013-10-06 ENCOUNTER — Telehealth: Payer: Self-pay | Admitting: Cardiology

## 2013-10-07 ENCOUNTER — Telehealth: Payer: Self-pay | Admitting: Cardiology

## 2013-10-07 NOTE — Telephone Encounter (Signed)
Pt. Wants  You to call her after 10:00 am tomorrow

## 2013-10-07 NOTE — Telephone Encounter (Signed)
If she believes that she is back in atrial fib she will need to be seen in the office to further address.  Next week at the latest.

## 2013-10-07 NOTE — Telephone Encounter (Signed)
Pt. Needs appt before Wednesday next week per Dr. Percival Spanish

## 2013-10-07 NOTE — Telephone Encounter (Signed)
Close encounter 

## 2013-10-08 NOTE — Telephone Encounter (Signed)
Closed encounter °

## 2013-10-11 ENCOUNTER — Emergency Department (HOSPITAL_COMMUNITY): Payer: Medicare Other

## 2013-10-11 ENCOUNTER — Ambulatory Visit: Payer: Medicare Other | Admitting: Cardiology

## 2013-10-11 ENCOUNTER — Encounter (HOSPITAL_COMMUNITY): Payer: Self-pay | Admitting: Emergency Medicine

## 2013-10-11 ENCOUNTER — Encounter (HOSPITAL_COMMUNITY): Payer: Medicare Other

## 2013-10-11 ENCOUNTER — Inpatient Hospital Stay (HOSPITAL_COMMUNITY)
Admission: EM | Admit: 2013-10-11 | Discharge: 2013-10-20 | DRG: 193 | Disposition: A | Discharge status: Skilled Nursing Facility | Payer: Medicare Other | Attending: Internal Medicine | Admitting: Internal Medicine

## 2013-10-11 ENCOUNTER — Observation Stay (HOSPITAL_COMMUNITY): Payer: Medicare Other

## 2013-10-11 DIAGNOSIS — I959 Hypotension, unspecified: Secondary | ICD-10-CM | POA: Diagnosis not present

## 2013-10-11 DIAGNOSIS — J9600 Acute respiratory failure, unspecified whether with hypoxia or hypercapnia: Secondary | ICD-10-CM | POA: Diagnosis present

## 2013-10-11 DIAGNOSIS — E440 Moderate protein-calorie malnutrition: Secondary | ICD-10-CM | POA: Diagnosis present

## 2013-10-11 DIAGNOSIS — E876 Hypokalemia: Secondary | ICD-10-CM | POA: Diagnosis present

## 2013-10-11 DIAGNOSIS — Z8249 Family history of ischemic heart disease and other diseases of the circulatory system: Secondary | ICD-10-CM

## 2013-10-11 DIAGNOSIS — I6789 Other cerebrovascular disease: Secondary | ICD-10-CM

## 2013-10-11 DIAGNOSIS — I509 Heart failure, unspecified: Secondary | ICD-10-CM | POA: Diagnosis present

## 2013-10-11 DIAGNOSIS — E785 Hyperlipidemia, unspecified: Secondary | ICD-10-CM | POA: Diagnosis present

## 2013-10-11 DIAGNOSIS — S81802D Unspecified open wound, left lower leg, subsequent encounter: Secondary | ICD-10-CM

## 2013-10-11 DIAGNOSIS — E669 Obesity, unspecified: Secondary | ICD-10-CM | POA: Diagnosis present

## 2013-10-11 DIAGNOSIS — Z710 Person encountering health services to consult on behalf of another person: Secondary | ICD-10-CM

## 2013-10-11 DIAGNOSIS — J9601 Acute respiratory failure with hypoxia: Secondary | ICD-10-CM

## 2013-10-11 DIAGNOSIS — R1310 Dysphagia, unspecified: Secondary | ICD-10-CM | POA: Diagnosis present

## 2013-10-11 DIAGNOSIS — I4819 Other persistent atrial fibrillation: Secondary | ICD-10-CM

## 2013-10-11 DIAGNOSIS — R002 Palpitations: Secondary | ICD-10-CM | POA: Diagnosis not present

## 2013-10-11 DIAGNOSIS — I350 Nonrheumatic aortic (valve) stenosis: Secondary | ICD-10-CM

## 2013-10-11 DIAGNOSIS — J208 Acute bronchitis due to other specified organisms: Secondary | ICD-10-CM

## 2013-10-11 DIAGNOSIS — I10 Essential (primary) hypertension: Secondary | ICD-10-CM | POA: Diagnosis present

## 2013-10-11 DIAGNOSIS — Z7901 Long term (current) use of anticoagulants: Secondary | ICD-10-CM

## 2013-10-11 DIAGNOSIS — J189 Pneumonia, unspecified organism: Principal | ICD-10-CM | POA: Diagnosis present

## 2013-10-11 DIAGNOSIS — Z66 Do not resuscitate: Secondary | ICD-10-CM | POA: Diagnosis present

## 2013-10-11 DIAGNOSIS — S27309S Unspecified injury of lung, unspecified, sequela: Secondary | ICD-10-CM

## 2013-10-11 DIAGNOSIS — D638 Anemia in other chronic diseases classified elsewhere: Secondary | ICD-10-CM | POA: Diagnosis present

## 2013-10-11 DIAGNOSIS — L97909 Non-pressure chronic ulcer of unspecified part of unspecified lower leg with unspecified severity: Secondary | ICD-10-CM | POA: Diagnosis present

## 2013-10-11 DIAGNOSIS — S81802S Unspecified open wound, left lower leg, sequela: Secondary | ICD-10-CM

## 2013-10-11 DIAGNOSIS — Z885 Allergy status to narcotic agent status: Secondary | ICD-10-CM

## 2013-10-11 DIAGNOSIS — I4891 Unspecified atrial fibrillation: Secondary | ICD-10-CM | POA: Diagnosis present

## 2013-10-11 DIAGNOSIS — I872 Venous insufficiency (chronic) (peripheral): Secondary | ICD-10-CM | POA: Diagnosis present

## 2013-10-11 DIAGNOSIS — Z6837 Body mass index (BMI) 37.0-37.9, adult: Secondary | ICD-10-CM

## 2013-10-11 DIAGNOSIS — I8312 Varicose veins of left lower extremity with inflammation: Secondary | ICD-10-CM

## 2013-10-11 DIAGNOSIS — I482 Chronic atrial fibrillation, unspecified: Secondary | ICD-10-CM

## 2013-10-11 DIAGNOSIS — I5033 Acute on chronic diastolic (congestive) heart failure: Secondary | ICD-10-CM | POA: Diagnosis present

## 2013-10-11 DIAGNOSIS — I5031 Acute diastolic (congestive) heart failure: Secondary | ICD-10-CM

## 2013-10-11 DIAGNOSIS — Z880 Allergy status to penicillin: Secondary | ICD-10-CM

## 2013-10-11 DIAGNOSIS — I480 Paroxysmal atrial fibrillation: Secondary | ICD-10-CM

## 2013-10-11 DIAGNOSIS — R0902 Hypoxemia: Secondary | ICD-10-CM | POA: Diagnosis present

## 2013-10-11 DIAGNOSIS — I6529 Occlusion and stenosis of unspecified carotid artery: Secondary | ICD-10-CM

## 2013-10-11 DIAGNOSIS — S27309A Unspecified injury of lung, unspecified, initial encounter: Secondary | ICD-10-CM

## 2013-10-11 DIAGNOSIS — Z888 Allergy status to other drugs, medicaments and biological substances status: Secondary | ICD-10-CM | POA: Diagnosis not present

## 2013-10-11 LAB — BASIC METABOLIC PANEL
Anion gap: 13 (ref 5–15)
BUN: 16 mg/dL (ref 6–23)
CO2: 26 mEq/L (ref 19–32)
Calcium: 8.4 mg/dL (ref 8.4–10.5)
Chloride: 94 mEq/L — ABNORMAL LOW (ref 96–112)
Creatinine, Ser: 0.64 mg/dL (ref 0.50–1.10)
GFR calc Af Amer: >90 mL/min (ref >90)
GFR, EST NON AFRICAN AMERICAN: 79 mL/min — AB (ref >90)
GLUCOSE: 104 mg/dL — AB (ref 70–99)
POTASSIUM: 4.3 meq/L (ref 3.7–5.3)
Sodium: 133 mEq/L — ABNORMAL LOW (ref 137–147)

## 2013-10-11 LAB — URINALYSIS, ROUTINE W REFLEX MICROSCOPIC
Bilirubin Urine: NEGATIVE
Glucose, UA: NEGATIVE mg/dL
Ketones, ur: NEGATIVE mg/dL
Nitrite: NEGATIVE
PH: 6.5 (ref 5.0–8.0)
Protein, ur: NEGATIVE mg/dL
SPECIFIC GRAVITY, URINE: 1.011 (ref 1.005–1.030)
Urobilinogen, UA: 1 mg/dL (ref 0.0–1.0)

## 2013-10-11 LAB — I-STAT TROPONIN, ED: Troponin i, poc: 0.01 ng/mL (ref 0.00–0.08)

## 2013-10-11 LAB — CBC WITH DIFFERENTIAL/PLATELET
Basophils Absolute: 0 10*3/uL (ref 0.0–0.1)
Basophils Relative: 0 % (ref 0–1)
EOS ABS: 0.1 10*3/uL (ref 0.0–0.7)
EOS PCT: 1 % (ref 0–5)
HCT: 36.2 % (ref 36.0–46.0)
HEMOGLOBIN: 11.8 g/dL — AB (ref 12.0–15.0)
LYMPHS ABS: 1.1 10*3/uL (ref 0.7–4.0)
Lymphocytes Relative: 14 % (ref 12–46)
MCH: 30.3 pg (ref 26.0–34.0)
MCHC: 32.6 g/dL (ref 30.0–36.0)
MCV: 92.8 fL (ref 78.0–100.0)
MONO ABS: 1.1 10*3/uL — AB (ref 0.1–1.0)
Monocytes Relative: 14 % — ABNORMAL HIGH (ref 3–12)
Neutro Abs: 5.5 10*3/uL (ref 1.7–7.7)
Neutrophils Relative %: 71 % (ref 43–77)
PLATELETS: 337 10*3/uL (ref 150–400)
RBC: 3.9 MIL/uL (ref 3.87–5.11)
RDW: 14.1 % (ref 11.5–15.5)
WBC: 7.8 10*3/uL (ref 4.0–10.5)

## 2013-10-11 LAB — TROPONIN I

## 2013-10-11 LAB — URINE MICROSCOPIC-ADD ON

## 2013-10-11 LAB — PRO B NATRIURETIC PEPTIDE: Pro B Natriuretic peptide (BNP): 2942 pg/mL — ABNORMAL HIGH (ref 0–450)

## 2013-10-11 MED ORDER — APIXABAN 5 MG PO TABS
5.0000 mg | ORAL_TABLET | Freq: Two times a day (BID) | ORAL | Status: DC
  Administered 2013-10-11 – 2013-10-20 (×18): 5 mg via ORAL
  Filled 2013-10-11 (×19): qty 1

## 2013-10-11 MED ORDER — LOSARTAN POTASSIUM 50 MG PO TABS
50.0000 mg | ORAL_TABLET | Freq: Every day | ORAL | Status: DC
  Filled 2013-10-11: qty 1

## 2013-10-11 MED ORDER — ACETAMINOPHEN 325 MG PO TABS
650.0000 mg | ORAL_TABLET | Freq: Four times a day (QID) | ORAL | Status: DC | PRN
  Administered 2013-10-16: 650 mg via ORAL
  Filled 2013-10-11: qty 2

## 2013-10-11 MED ORDER — SODIUM CHLORIDE 0.9 % IJ SOLN
3.0000 mL | Freq: Two times a day (BID) | INTRAMUSCULAR | Status: DC
  Administered 2013-10-11 – 2013-10-15 (×9): 3 mL via INTRAVENOUS
  Administered 2013-10-16: 22:00:00 via INTRAVENOUS
  Administered 2013-10-16 – 2013-10-20 (×6): 3 mL via INTRAVENOUS

## 2013-10-11 MED ORDER — FUROSEMIDE 10 MG/ML IJ SOLN
40.0000 mg | Freq: Once | INTRAMUSCULAR | Status: AC
  Administered 2013-10-11: 40 mg via INTRAVENOUS
  Filled 2013-10-11: qty 4

## 2013-10-11 MED ORDER — ONDANSETRON HCL 4 MG/2ML IJ SOLN: 4.0000 mg | Freq: Four times a day (QID) | INTRAMUSCULAR | Status: DC | PRN

## 2013-10-11 MED ORDER — VITAMIN E 45 MG (100 UNIT) PO CAPS
1000.0000 [IU] | ORAL_CAPSULE | Freq: Every day | ORAL | Status: DC
  Administered 2013-10-12 – 2013-10-20 (×9): 1000 [IU] via ORAL
  Filled 2013-10-11 (×9): qty 2

## 2013-10-11 MED ORDER — DEXTROSE 5 % IV SOLN
1.0000 g | Freq: Three times a day (TID) | INTRAVENOUS | Status: AC
  Administered 2013-10-11 – 2013-10-18 (×21): 1 g via INTRAVENOUS
  Filled 2013-10-11 (×21): qty 1

## 2013-10-11 MED ORDER — FUROSEMIDE 10 MG/ML IJ SOLN: 20.0000 mg | Freq: Two times a day (BID) | INTRAMUSCULAR | Status: DC

## 2013-10-11 MED ORDER — METOPROLOL TARTRATE 50 MG PO TABS
50.0000 mg | ORAL_TABLET | Freq: Two times a day (BID) | ORAL | Status: DC
  Administered 2013-10-11: 50 mg via ORAL
  Filled 2013-10-11 (×3): qty 1

## 2013-10-11 MED ORDER — ONDANSETRON HCL 4 MG PO TABS
4.0000 mg | ORAL_TABLET | Freq: Four times a day (QID) | ORAL | Status: DC | PRN
Start: 2013-10-11 — End: 2013-10-20

## 2013-10-11 MED ORDER — VANCOMYCIN HCL IN DEXTROSE 1-5 GM/200ML-% IV SOLN
1000.0000 mg | INTRAVENOUS | Status: AC
  Administered 2013-10-11: 1000 mg via INTRAVENOUS
  Filled 2013-10-11: qty 200

## 2013-10-11 MED ORDER — POTASSIUM CHLORIDE CRYS ER 20 MEQ PO TBCR
20.0000 meq | EXTENDED_RELEASE_TABLET | Freq: Every day | ORAL | Status: DC
  Administered 2013-10-12 – 2013-10-17 (×6): 20 meq via ORAL
  Filled 2013-10-11 (×7): qty 1

## 2013-10-11 MED ORDER — VANCOMYCIN HCL IN DEXTROSE 750-5 MG/150ML-% IV SOLN
750.0000 mg | Freq: Two times a day (BID) | INTRAVENOUS | Status: DC
  Administered 2013-10-11 – 2013-10-12 (×3): 750 mg via INTRAVENOUS
  Filled 2013-10-11 (×4): qty 150

## 2013-10-11 MED ORDER — AZTREONAM 2 G IJ SOLR
2.0000 g | Freq: Once | INTRAMUSCULAR | Status: AC
  Administered 2013-10-11: 2 g via INTRAVENOUS
  Filled 2013-10-11: qty 2

## 2013-10-11 MED ORDER — ALBUTEROL SULFATE (2.5 MG/3ML) 0.083% IN NEBU: 2.5000 mg | INHALATION_SOLUTION | RESPIRATORY_TRACT | Status: DC | PRN

## 2013-10-11 MED ORDER — FUROSEMIDE 10 MG/ML IJ SOLN
40.0000 mg | Freq: Two times a day (BID) | INTRAMUSCULAR | Status: DC
  Administered 2013-10-11 – 2013-10-13 (×4): 40 mg via INTRAVENOUS
  Filled 2013-10-11 (×7): qty 4

## 2013-10-11 MED ORDER — ACETAMINOPHEN 650 MG RE SUPP
650.0000 mg | Freq: Four times a day (QID) | RECTAL | Status: DC | PRN
Start: 2013-10-11 — End: 2013-10-20

## 2013-10-11 NOTE — ED Notes (Signed)
attempted report 

## 2013-10-11 NOTE — Progress Notes (Addendum)
ANTIBIOTIC CONSULT NOTE - INITIAL  Pharmacy Consult for Vancomycin Indication: pneumonia  Allergies  Allergen Reactions  . Codeine     Unknown  . Cucumber Extract Nausea And Vomiting  . Diuretic [Buchu-Cornsilk-Ch Grass-Hydran]     Unknown  . Lipitor [Atorvastatin Calcium]     Unknown  . Penicillins Other (See Comments)    unknown  . Pravachol     Unknown    Patient Measurements: Height: 5\' 2"  (157.5 cm) Weight: 203 lb (92.08 kg) IBW/kg (Calculated) : 50.1 Adjusted Body Weight: 63 kg  Vital Signs: Temp: 100.1 F (37.8 C) (08/24 1243) Temp src: Oral (08/24 1243) BP: 121/81 mmHg (08/24 1430) Pulse Rate: 107 (08/24 1430) Intake/Output from previous day:   Intake/Output from this shift:    Labs:  Recent Labs  10/11/13 1243  WBC 7.8  HGB 11.8*  PLT 337  CREATININE 0.64   Estimated Creatinine Clearance: 54.3 ml/min (by C-G formula based on Cr of 0.64). No results found for this basename: Letta Median, VANCORANDOM, [place], GENTPEAK, GENTRANDOM, TOBRATROUGH, TOBRAPEAK, TOBRARND, AMIKACINPEAK, AMIKACINTROU, AMIKACIN,  in the last 72 hours   Microbiology: Recent Results (from the past 720 hour(s))  URINE CULTURE     Status: None   Collection Time    09/26/13 10:21 AM      Result Value Ref Range Status   Specimen Description URINE, CATHETERIZED   Final   Special Requests NONE   Final   Culture  Setup Time     Final   Value: 09/26/2013 18:53     Performed at [facility]     Final   Value: NO GROWTH     Performed at [facility]   Culture     Final   Value: NO GROWTH     Performed at [facility]   Report Status 09/27/2013 FINAL   Final    Medical History: Past Medical History  Diagnosis Date  . Hyperlipidemia   . Arrhythmia     PVC  . Retinal detachment   . Renal disorder   . Allergy   . Occlusion and stenosis of carotid artery without mention of cerebral infarction 03/24/2012  . Pneumonia,  organism unspecified 11/192013  . Depressive disorder, not elsewhere classified 03/05/2011  . Other premature beats 03/05/2011  . Other seborrheic keratosis 11/20/2010  . Syncope and collapse 10/02/2010  . Palpitations 11/21/2009  . Unspecified essential hypertension 06/27/2009  . Cardiomegaly 06/27/2009  . Other generalized ischemic cerebrovascular disease 06/27/2009  . Dizziness and giddiness 06/27/2009  . Unspecified vitamin D deficiency 05/16/2009  . Family history of sudden cardiac death (SCD) 12-11-08  . Rash and other nonspecific skin eruption 07/05/2008  . Edema 03/15/2008  . Obesity, unspecified 07/25/2006  . Varicose veins of lower extremities with inflammation 03/16/2003  . Shortness of breath     Medications:  See electronic med rec  Assessment: 80 y.o. female presents with SOB, cough. Noted recent adimt for afib/RVR (s/p TEE/DCCV), PNA - discharged on 8/15. Pt received Vanc 1gm in ED ~1420. Also ordered Aztreonam 2gm once - not given yet. Estimated CrCl 54 ml/min. Wbc wnl.  Goal of Therapy:  Vancomycin trough level 15-20 mcg/ml  Plan:  1. Vancomycin 750mg  IV q12h x 8 days total 2. Will f/u renal function, pt's clinical condition 3. Vanc trough prn 4. F/u admit GNR coverage  Sherlon Handing, PharmD, BCPS Clinical pharmacist, pager [phone] 10/11/2013,3:00 PM  Addendum: Continuing aztreonam  Plan: 1. Aztreonam 1gm IV  Q8H 2. F/u renal fxn, C&S, clinical status  Salome Arnt, PharmD, BCPS Pager # [phone] 10/11/2013 3:24 PM

## 2013-10-11 NOTE — H&P (Signed)
[facility] History and Physical  Julie Haas HEN:[id number] DOB: 01-Dec-1925 DOA: 10/11/2013  Referring physician:  PCP: Estill Dooms, MD  Specialists:   Chief Complaint: SOB, cough   HPI: Julie Haas is a 80 y.o. female with PMH of HTN, HPL, CHF,  A Fib on AC, recently admitted by cardiology service for a fib RVR, pneumonia  s/p TEE/DCCV, discharged on 8/15 presented with worsening SOB, DOE, mild productive cough, chills; denies chest pain, no fever, no nasea, vomiting, or diarrhea, no abd pain; Pt also reports LE edema , ortopnea, no PND,  -ED CXR showed multifocal pneumonia, hospitalist called for admission   Review of Systems: The patient denies anorexia, fever, weight loss,, vision loss, decreased hearing, hoarseness, chest pain, syncope,  peripheral edema, balance deficits, hemoptysis, abdominal pain, melena, hematochezia, severe indigestion/heartburn, hematuria, incontinence, genital sores, muscle weakness, suspicious skin lesions, transient blindness, difficulty walking, depression, unusual weight change, abnormal bleeding, enlarged lymph nodes, angioedema, and breast masses.    Past Medical History  Diagnosis Date  . Hyperlipidemia   . Arrhythmia     PVC  . Retinal detachment   . Renal disorder   . Allergy   . Occlusion and stenosis of carotid artery without mention of cerebral infarction 03/24/2012  . Pneumonia, organism unspecified 11/192013  . Depressive disorder, not elsewhere classified 03/05/2011  . Other premature beats 03/05/2011  . Other seborrheic keratosis 11/20/2010  . Syncope and collapse 10/02/2010  . Palpitations 11/21/2009  . Unspecified essential hypertension 06/27/2009  . Cardiomegaly 06/27/2009  . Other generalized ischemic cerebrovascular disease 06/27/2009  . Dizziness and giddiness 06/27/2009  . Unspecified vitamin D deficiency 05/16/2009  . Family history of sudden cardiac death (SCD) 12-01-08  . Rash and other nonspecific skin eruption  07/05/2008  . Edema 03/15/2008  . Obesity, unspecified 07/25/2006  . Varicose veins of lower extremities with inflammation 03/16/2003  . Shortness of breath    Past Surgical History  Procedure Laterality Date  . Cholecystectomy  1983  . Cataract extraction Bilateral     Dr. [name]  . Tonsillectomy    . Keratosis    . Retinal detachment surgery Left   . Cardioversion N/A 10/01/2013    Procedure: CARDIOVERSION;  Surgeon: Sanda Klein, MD;  Location: [facility] ENDOSCOPY;  Service: Cardiovascular;  Laterality: N/A;  . Tee without cardioversion N/A 10/01/2013    Procedure: TRANSESOPHAGEAL ECHOCARDIOGRAM (TEE);  Surgeon: Sanda Klein, MD;  Location: [facility] ENDOSCOPY;  Service: Cardiovascular;  Laterality: N/A;   Social History:  reports that she has never smoked. She has never used smokeless tobacco. She reports that she does not drink alcohol or use illicit drugs. ALF where does patient live--home, ALF, SNF? and with whom if at home? No';  Can patient participate in ADLs?  Allergies  Allergen Reactions  . Codeine     Unknown  . Cucumber Extract Nausea And Vomiting  . Diuretic [Buchu-Cornsilk-Ch Grass-Hydran]     Unknown  . Lipitor [Atorvastatin Calcium]     Unknown  . Penicillins Other (See Comments)    unknown  . Pravachol     Unknown    Family History  Problem Relation Age of Onset  . Arrhythmia      Long QT in first degree relatives,  . Heart attack Father   . Heart disease Father   . Heart attack Brother   . Heart disease Brother   . Pneumonia Mother     (be sure to complete)  Prior to Admission medications  Medication Sig Start Date End Date Taking? Authorizing Provider  apixaban (ELIQUIS) 5 MG TABS tablet Take 1 tablet (5 mg total) by mouth 2 (two) times daily. 10/02/13  Yes Tarri Fuller, PA-C  furosemide (LASIX) 40 MG tablet Take 1 tablet (40 mg total) by mouth daily. 10/02/13  Yes Tarri Fuller, PA-C  losartan (COZAAR) 50 MG tablet Take 50 mg by mouth daily.   Yes Historical  Provider, MD  metoprolol (LOPRESSOR) 50 MG tablet Take 50 mg by mouth 2 (two) times daily.   Yes Historical Provider, MD  potassium chloride SA (K-DUR,KLOR-CON) 20 MEQ tablet Take 1 tablet (20 mEq total) by mouth daily. 10/02/13  Yes Tarri Fuller, PA-C  vitamin E 1000 UNIT capsule Take 1,000 Units by mouth daily.   Yes Historical Provider, MD  levofloxacin (LEVAQUIN) 750 MG tablet Take 1 tablet (750 mg total) by mouth daily. 10/02/13   Tarri Fuller, PA-C   Physical Exam: Filed Vitals:   10/11/13 1430  BP: 121/81  Pulse: 107  Temp:   Resp: 22     General:  alert  Eyes: eomi  ENT: no oral ulcers   Neck: suppl,e JVD  Cardiovascular: s1,s2 irregular   Respiratory: BL crackles   Abdomen: soft, nt,nd   Skin: L LE ulcers   Musculoskeletal: LE edema, L LE ulcers   Psychiatric: no hallucintsions   Neurologic: CN 2-12 intact, motor 5/5 BL  Labs on Admission:  Basic Metabolic Panel:  Recent Labs Lab 10/11/13 1243  NA 133*  K 4.3  CL 94*  CO2 26  GLUCOSE 104*  BUN 16  CREATININE 0.64  CALCIUM 8.4   Liver Function Tests: No results found for this basename: AST, ALT, ALKPHOS, BILITOT, PROT, ALBUMIN,  in the last 168 hours No results found for this basename: LIPASE, AMYLASE,  in the last 168 hours No results found for this basename: AMMONIA,  in the last 168 hours CBC:  Recent Labs Lab 10/11/13 1243  WBC 7.8  NEUTROABS 5.5  HGB 11.8*  HCT 36.2  MCV 92.8  PLT 337   Cardiac Enzymes: No results found for this basename: CKTOTAL, CKMB, CKMBINDEX, TROPONINI,  in the last 168 hours  BNP (last 3 results)  Recent Labs  09/24/13 0248 09/28/13 1000  PROBNP 4829.0* 2058.0*   CBG: No results found for this basename: GLUCAP,  in the last 168 hours  Radiological Exams on Admission: Dg Chest Portable 1 View  10/11/2013   CLINICAL DATA:  Atrial fibrillation and tachycardia.  EXAM: PORTABLE CHEST - 1 VIEW  COMPARISON:  Chest radiograph of 09/30/2013.  CT of 01/23/2009   FINDINGS: Midline trachea. Moderate cardiomegaly. Chronic right-sided pleural thickening blunts the costophrenic angle. Possible small left pleural effusion. No pneumothorax. Interstitial thickening is chronic. Patchy right upper lobe peripheral opacity has been present back to 01/10/2012. There is also chronic right base pulmonary opacity. Left lower lobe pulmonary opacity may be progressive since 09/30/2013. Grossly similar to 09/28/2013.  IMPRESSION: Cardiomegaly with chronic interstitial thickening. No overt congestive failure.  Multi focal pulmonary opacities. Primarily felt to represent areas of post infectious scarring. Cannot exclude progressive left lower lobe infection or aspiration.  Given chronicity, CT may be informative to re-evaluate the pulmonary opacities.   Electronically Signed   By: Abigail Miyamoto M.D.   On: 10/11/2013 13:33    EKG: Independently reviewed.   Assessment/Plan Principal Problem:   HCAP (healthcare-associated pneumonia) Active Problems:   HTN (hypertension)   Atrial fibrillation   Acute diastolic heart failure  80 y.o. female with PMH of HTN, HPL, CHF,  A Fib on AC, recently admitted by cardiology service for a fib RVR, pneumonia  s/p TEE/DCCV, discharged on 8/15 presented today with worsening SOB, DOE, mild productive cough, chills; denies chest pain, no fever, no nasea, vomiting, or diarrhea, no abd pain; Pt also reports LE edema , ortopnea, no PND,  -ED CXR showed multifocal pneumonia, hospitalist called for admission   1. HCAP; h/o recurrent pneumonia; CXR: Multi focal pulmonary opacities -started on IV atx, f/u cultures; cont oxygen, bronchodilators prn; obtain CT for better eval    2. Acute on chronic CHF ? Worse with A fib RVR --> AS; clinically hypervolemic; pend BnP -Echo (09/2013): LVEF 55%, LVH, Wall motion was normal, AS with AVA 1.2cm, TR, MR, dilated atrium -lasix 40 IV x1; I/O, daily weight; cont diuresis; consulted cardiology for AS tavr evaluation   -minimal ST changes in ECG, initial trop neg; Pt denies chest pain; f/u serial trop, ECG, called cardiology   3. A Fib on AC RVR s/p cardizem in EMS;  -s/p TEE/DCCV on 8/14--> still in fib; cont BB, cont AC, f/u cardiology eval    4. HTN, stable cont home regimen   5. L LE wounds chronic; consult wound care   Cardiology;  if consultant consulted, please document name and whether formally or informally consulted  Code Status: full (must indicate code status--if unknown or must be presumed, indicate so) Family Communication:  D/w patient (indicate person spoken with, if applicable, with phone number if by telephone) Disposition Plan: pend clinical improvement  (indicate anticipated LOS)  Time spent: >35 minutes   Kinnie Feil [facility] Pager [phone]  If 7PM-7AM, please contact night-coverage [email or web address] Password [facility] 10/11/2013, 2:45 PM

## 2013-10-11 NOTE — ED Notes (Addendum)
Per EMS- Pt was at [facility] [address] where she is living and began to feel worse from new afib that she was just d/c from hospital from. HR was in the 150s. EMS gave 20 of cardizem, HR brought down to 70's. BP brought down to 90s/70s. Pt was also satting 89 % on RA. Placed on 3L O2 via [place]. 20 G PIV L wrist. Pt also is being treated for pneumonia.

## 2013-10-11 NOTE — Progress Notes (Signed)
[facility] PROGRESS NOTE  Julie Haas NOI:[id number] DOB: Mar 08, 1925 DOA: 10/11/2013 PCP: Estill Dooms, MD  D/w patient, confirmed  -Pt is DNR; called updated Oletta Lamas, E.T. Son [phone]  Kinnie Feil  [facility] Pager [phone]. If 7PM-7AM, please contact night-coverage at [email or web address], password [facility] 10/11/2013, 3:54 PM  LOS: 0 days

## 2013-10-11 NOTE — ED Provider Notes (Signed)
CSN: [id number]     Arrival date & time 10/11/13  1213 History   First MD Initiated Contact with Patient 10/11/13 1221     Chief Complaint  Patient presents with  . Tachycardia  . Atrial Fibrillation     Patient is a 80 y.o. female presenting with palpitations. The history is provided by the patient.  Palpitations Palpitations quality:  Fast Onset quality:  Sudden Timing:  Constant Progression:  Improving Chronicity:  Recurrent Relieved by: cardizem. Worsened by:  Nothing tried Associated symptoms: cough and shortness of breath   Associated symptoms: no chest pain   Patient presents from nursing facility for palpitations, SOB/cough She denies CP She is now improving as she was given cardizem by EMS    Past Medical History  Diagnosis Date  . Hyperlipidemia   . Arrhythmia     PVC  . Retinal detachment   . Renal disorder   . Allergy   . Occlusion and stenosis of carotid artery without mention of cerebral infarction 03/24/2012  . Pneumonia, organism unspecified 11/192013  . Depressive disorder, not elsewhere classified 03/05/2011  . Other premature beats 03/05/2011  . Other seborrheic keratosis 11/20/2010  . Syncope and collapse 10/02/2010  . Palpitations 11/21/2009  . Unspecified essential hypertension 06/27/2009  . Cardiomegaly 06/27/2009  . Other generalized ischemic cerebrovascular disease 06/27/2009  . Dizziness and giddiness 06/27/2009  . Unspecified vitamin D deficiency 05/16/2009  . Family history of sudden cardiac death (SCD) 12-29-2008  . Rash and other nonspecific skin eruption 07/05/2008  . Edema 03/15/2008  . Obesity, unspecified 07/25/2006  . Varicose veins of lower extremities with inflammation 03/16/2003  . Shortness of breath    Past Surgical History  Procedure Laterality Date  . Cholecystectomy  1983  . Cataract extraction Bilateral     Dr. [name]  . Tonsillectomy    . Keratosis    . Retinal detachment surgery Left   . Cardioversion N/A 10/01/2013     Procedure: CARDIOVERSION;  Surgeon: Sanda Klein, MD;  Location: [facility] ENDOSCOPY;  Service: Cardiovascular;  Laterality: N/A;  . Tee without cardioversion N/A 10/01/2013    Procedure: TRANSESOPHAGEAL ECHOCARDIOGRAM (TEE);  Surgeon: Sanda Klein, MD;  Location[facility] ENDOSCOPY;  Service: Cardiovascular;  Laterality: N/A;   Family History  Problem Relation Age of Onset  . Arrhythmia      Long QT in first degree relatives,  . Heart attack Father   . Heart disease Father   . Heart attack Brother   . Heart disease Brother   . Pneumonia Mother    History  Substance Use Topics  . Smoking status: Never Smoker   . Smokeless tobacco: Never Used  . Alcohol Use: No   OB History   Grav Para Term Preterm Abortions TAB SAB Ect Mult Living                 Review of Systems  Respiratory: Positive for cough and shortness of breath.   Cardiovascular: Positive for palpitations. Negative for chest pain.  Skin: Positive for wound.       Chronic wound to left LE   All other systems reviewed and are negative.     Allergies  Codeine; Diuretic; Lipitor; Penicillins; and Pravachol  Home Medications   Prior to Admission medications   Medication Sig Start Date End Date Taking? Authorizing Provider  apixaban (ELIQUIS) 5 MG TABS tablet Take 1 tablet (5 mg total) by mouth 2 (two) times daily. 10/02/13   Tarri Fuller, PA-C  furosemide (LASIX)  40 MG tablet Take 1 tablet (40 mg total) by mouth daily. 10/02/13   Tarri Fuller, PA-C  levofloxacin (LEVAQUIN) 750 MG tablet Take 1 tablet (750 mg total) by mouth daily. 10/02/13   Tarri Fuller, PA-C  losartan (COZAAR) 50 MG tablet Take 50 mg by mouth daily.    Historical Provider, MD  metoprolol tartrate (LOPRESSOR) 25 MG tablet Take 25 mg by mouth 2 (two) times daily.    Historical Provider, MD  potassium chloride SA (K-DUR,KLOR-CON) 20 MEQ tablet Take 1 tablet (20 mEq total) by mouth daily. 10/02/13   Tarri Fuller, PA-C  vitamin E 1000 UNIT capsule Take 1,000 Units by  mouth daily.    Historical Provider, MD   BP 111/71  Pulse 87  Temp(Src) 100.1 F (37.8 C) (Oral)  Resp 26  SpO2 94% Physical Exam CONSTITUTIONAL:elderly, frail HEAD: Normocephalic/atraumatic EYES: EOMI ENMT: Mucous membranes moist NECK: supple no meningeal signs CV: tachycardic, no loud murmurs LUNGS: crackles noted bilaterally with mild tachypnea ABDOMEN: soft, nontender, no rebound or guarding NEURO: Pt is awake/alert, moves all extremitiesx4 EXTREMITIES: pulses normal, full ROM SKIN: warm, left LE is wrapped in bandage  PSYCH: no abnormalities of mood noted  ED Course  Procedures 1:04 PM Pt here with atrial fibrillation with RVR that has already responded to cardizem Also appears SOB CXR pending Wound on left LE is followed/managed by wound center 2:14 PM Heart rate improved Pt still tachypneic with crackles I spoke to [place], apartment nurse at [facility] Pt has worsened while staying in the apartment section at facility and was tachycardic/SOB today which is new for her.  She also has oxygen requirement at this time She could be elevated to skilled nursing but requires further assessment Will call [facility] for OBS admission 2:22 PM Will admit to tele OBS Antibiotics for health care associated pneumonia ordered  Labs Review Labs Reviewed  BASIC METABOLIC PANEL - Abnormal; Notable for the following:    Sodium 133 (*)    Chloride 94 (*)    Glucose, Bld 104 (*)    GFR calc non Af Amer 79 (*)    All other components within normal limits  CBC WITH DIFFERENTIAL - Abnormal; Notable for the following:    Hemoglobin 11.8 (*)    Monocytes Relative 14 (*)    Monocytes Absolute 1.1 (*)    All other components within normal limits  URINALYSIS, ROUTINE W REFLEX MICROSCOPIC - Abnormal; Notable for the following:    Hgb urine dipstick TRACE (*)    Leukocytes, UA TRACE (*)    All other components within normal limits  URINE MICROSCOPIC-ADD ON - Abnormal; Notable for  the following:    Squamous Epithelial / LPF FEW (*)    All other components within normal limits  URINE CULTURE  PRO B NATRIURETIC PEPTIDE  I-STAT TROPOININ, ED    Imaging Review Dg Chest Portable 1 View  10/11/2013   CLINICAL DATA:  Atrial fibrillation and tachycardia.  EXAM: PORTABLE CHEST - 1 VIEW  COMPARISON:  Chest radiograph of 09/30/2013.  CT of 01/23/2009  FINDINGS: Midline trachea. Moderate cardiomegaly. Chronic right-sided pleural thickening blunts the costophrenic angle. Possible small left pleural effusion. No pneumothorax. Interstitial thickening is chronic. Patchy right upper lobe peripheral opacity has been present back to 01/10/2012. There is also chronic right base pulmonary opacity. Left lower lobe pulmonary opacity may be progressive since 09/30/2013. Grossly similar to 09/28/2013.  IMPRESSION: Cardiomegaly with chronic interstitial thickening. No overt congestive failure.  Multi focal pulmonary  opacities. Primarily felt to represent areas of post infectious scarring. Cannot exclude progressive left lower lobe infection or aspiration.  Given chronicity, CT may be informative to re-evaluate the pulmonary opacities.   Electronically Signed   By: Abigail Miyamoto M.D.   On: 10/11/2013 13:33     EKG Interpretation   Date/Time:  Monday October 11 2013 12:47:27 EDT Ventricular Rate:  85 PR Interval:    QRS Duration: 97 QT Interval:  393 QTC Calculation: 467 R Axis:   58 Text Interpretation:  Atrial fibrillation Anteroseptal infarct, age  indeterminate Minimal ST elevation, inferior leads Confirmed by Christy Gentles   MD, Gottfried Standish ([id number]) on 10/11/2013 1:01:13 PM      MDM   Final diagnoses:  Paroxysmal atrial fibrillation  HCAP (healthcare-associated pneumonia)    Nursing notes including past medical history and social history reviewed and considered in documentation xrays reviewed and considered Labs/vital reviewed and considered Previous records reviewed and  considered     Sharyon Cable, MD 10/11/13 1423

## 2013-10-11 NOTE — Consult Note (Signed)
CARDIOLOGY CONSULT NOTE   Patient ID: Julie Haas MRN: [id number] DOB/AGE: 1925/09/01 80 y.o.  Admit date: 10/11/2013  Primary Physician   Estill Dooms, MD Primary Cardiologist   [facility] Reason for Consultation   AJO:INOMVE [name] is a 80 y.o. female with PMH of HTN, HPL, CHF, A Fib on AC, admitted 08/07-08/15 by cardiology service for a fib RVR, pneumonia. S/p TEE/DCCV.   She presented 08/24 with worsening SOB, DOE, mild productive cough, chills; denies chest pain, no fever, no nasea, vomiting, or diarrhea, no abd pain; Pt also reports LE edema , ortopnea, no PND. ED CXR showed multifocal pneumonia, ECG with atrial fib. Hospitalist called for admission, cardiology asked to consult for CHF and atrial fibrillation.  [name] states she has been coughing more, productive but cannot describe the sputum. She is aware that her legs are more swollen than previously, she was weighing daily and her weight has not changed. She is compliant with medications. The day after discharge, her pulse was checked by Dr. [name] (retired cardiologist) who told her she was out of rhythm. She has no awareness that her heart is out rhythm.   After discharge, she was not aware that her breathing was any worse, she just called the RN because she was depressed. She has been using a scooter since d/c from the hospital. She also has been using a walker since her husband died in 2013-03-07.   Past Medical History  Diagnosis Date  . Hyperlipidemia   . Arrhythmia     PVC  . Retinal detachment   . Renal disorder   . Allergy   . Occlusion and stenosis of carotid artery without mention of cerebral infarction 03/24/2012  . Pneumonia, organism unspecified 11/192013  . Depressive disorder, not elsewhere classified 03/05/2011  . Other premature beats 03/05/2011  . Other seborrheic keratosis 11/20/2010  . Syncope and collapse 10/02/2010  . Palpitations 11/21/2009  . Unspecified essential hypertension 06/27/2009   . Cardiomegaly 06/27/2009  . Other generalized ischemic cerebrovascular disease 06/27/2009  . Dizziness and giddiness 06/27/2009  . Unspecified vitamin D deficiency 05/16/2009  . Family history of sudden cardiac death (SCD) December 16, 2008  . Rash and other nonspecific skin eruption 07/05/2008  . Edema 03/15/2008  . Obesity, unspecified 07/25/2006  . Varicose veins of lower extremities with inflammation 03/16/2003  . Shortness of breath      Past Surgical History  Procedure Laterality Date  . Cholecystectomy  1983  . Cataract extraction Bilateral     Dr. [name]  . Tonsillectomy    . Keratosis    . Retinal detachment surgery Left   . Cardioversion N/A 10/01/2013    Procedure: CARDIOVERSION;  Surgeon: Sanda Klein, MD;  Location: [facility] ENDOSCOPY;  Service: Cardiovascular;  Laterality: N/A;  . Tee without cardioversion N/A 10/01/2013    Procedure: TRANSESOPHAGEAL ECHOCARDIOGRAM (TEE);  Surgeon: Sanda Klein, MD;  Location[facility] ENDOSCOPY;  Service: Cardiovascular;  Laterality: N/A;    Allergies  Allergen Reactions  . Codeine     Unknown  . Cucumber Extract Nausea And Vomiting  . Diuretic [Buchu-Cornsilk-Ch Grass-Hydran]     Unknown  . Lipitor [Atorvastatin Calcium]     Unknown  . Penicillins Other (See Comments)    unknown  . Pravachol     Unknown    I have reviewed the patient's current medications . apixaban  5 mg Oral BID  . [START ON 10/12/2013] aztreonam  1 g Intravenous Q8H  . aztreonam  2 g Intravenous Once  . furosemide  20 mg Intravenous BID  . [START ON 10/12/2013] losartan  50 mg Oral Daily  . metoprolol  50 mg Oral BID  . [START ON 10/12/2013] potassium chloride SA  20 mEq Oral Daily  . sodium chloride  3 mL Intravenous Q12H  . [START ON 10/12/2013] vancomycin  750 mg Intravenous Q12H  . [START ON 10/12/2013] vitamin E  1,000 Units Oral Daily     acetaminophen, acetaminophen, albuterol, ondansetron (ZOFRAN) IV, ondansetron  Prior to Admission medications   Medication  Sig Start Date End Date Taking? Authorizing Provider  apixaban (ELIQUIS) 5 MG TABS tablet Take 1 tablet (5 mg total) by mouth 2 (two) times daily. 10/02/13  Yes Tarri Fuller, PA-C  furosemide (LASIX) 40 MG tablet Take 1 tablet (40 mg total) by mouth daily. 10/02/13  Yes Tarri Fuller, PA-C  losartan (COZAAR) 50 MG tablet Take 50 mg by mouth daily.   Yes Historical Provider, MD  metoprolol (LOPRESSOR) 50 MG tablet Take 50 mg by mouth 2 (two) times daily.   Yes Historical Provider, MD  potassium chloride SA (K-DUR,KLOR-CON) 20 MEQ tablet Take 1 tablet (20 mEq total) by mouth daily. 10/02/13  Yes Tarri Fuller, PA-C  vitamin E 1000 UNIT capsule Take 1,000 Units by mouth daily.   Yes Historical Provider, MD  levofloxacin (LEVAQUIN) 750 MG tablet Take 1 tablet (750 mg total) by mouth daily. 10/02/13   Tarri Fuller, PA-C     History   Social History  . Marital Status: Widowed    Spouse Name: N/A    Number of Children: N/A  . Years of Education: N/A   Occupational History  . Housewife    Social History Main Topics  . Smoking status: Never Smoker   . Smokeless tobacco: Never Used  . Alcohol Use: No  . Drug Use: No  . Sexual Activity: No   Other Topics Concern  . Not on file   Social History Narrative   The patient is widowed, husband died 03-08-2011.  She has two children and two     grandchildren.     She never smoked cigarettes    Does not drink alcohol.    Drinks 2 cups caffeine coffee,    L[facility] at [facility] since 09/03/2004   Living Will   Exercise: walks daily          Family Status  Relation Status Death Age  . Father Deceased 26  . Brother Deceased 68  . Mother Deceased 32  . Daughter Alive   . Son Alive    Family History  Problem Relation Age of Onset  . Arrhythmia      Long QT in first degree relatives,  . Heart attack Father   . Heart disease Father   . Heart attack Brother   . Heart disease Brother   . Pneumonia Mother      ROS: Had hematuria for a week  after last admission, so does not want a foley. Full 14 point review of systems complete and found to be negative unless listed above.  Physical Exam: Blood pressure 121/81, pulse 107, temperature 100.1 F (37.8 C), temperature source Oral, resp. rate 22, height 5\' 2"  (1.575 m), weight 203 lb (92.08 kg), SpO2 92.00%.  General: Well developed, well nourished, female in mild respiratory distress on O2 Head: Eyes PERRLA, No xanthomas.   Normocephalic and atraumatic, oropharynx without edema or exudate. Dentition: poor Lungs: bilateral rales, few rhonchi, poor  air movement Heart: Heart irregular rate and rhythm with S1, S2; soft systolic ejection murmur at the RUSB, pulses are 2+ all 4 extrem.   Neck: No carotid bruits. No lymphadenopathy.  JVD at 10 cm Abdomen: Bowel sounds present, abdomen soft and non-tender without masses or hernias noted. Msk:  No spine or cva tenderness. Generalized weakness, no joint deformities or effusions. Extremities: No clubbing or cyanosis. 2+ RLE edema. LLE with dressing in place and not disturbed, 2+ pedal edema noted Neuro: Alert and oriented X 3. No focal deficits noted. Psych:  Good affect, responds appropriately Skin: No rashes or lesions noted.  Labs:   Lab Results  Component Value Date   WBC 7.8 10/11/2013   HGB 11.8* 10/11/2013   HCT 36.2 10/11/2013   MCV 92.8 10/11/2013   PLT 337 10/11/2013     Recent Labs Lab 10/11/13 1243  NA 133*  K 4.3  CL 94*  CO2 26  BUN 16  CREATININE 0.64  CALCIUM 8.4  GLUCOSE 104*    Recent Labs  10/11/13 1257  TROPIPOC 0.01   Pro B Natriuretic peptide (BNP)  Date/Time Value Ref Range Status  10/11/2013 12:03 PM 2942.0* 0 - 450 pg/mL Final  09/28/2013 10:00 AM 2058.0* 0 - 450 pg/mL Final   TSH  Date/Time Value Ref Range Status  09/24/2013  8:34 AM 2.580  0.350 - 4.500 uIU/mL Final   Echo: 09/24/2013 Conclusions - Left ventricle: The cavity size was normal. Wall thickness was increased in a pattern of mild  LVH. Systolic function was normal. The estimated ejection fraction was in the range of 55% to 60%. - Aortic valve: There was mild stenosis. Valve area (VTI): 1.34 cm^2. Valve area (Vmax): 1.23 cm^2. - Mitral valve: Calcified annulus. There was mild regurgitation. - Left atrium: The atrium was moderately dilated. - Atrial septum: No defect or patent foramen ovale was identified.  ECG:  Atrial fib, rate 84, No acute ischemic changes.  Radiology:  Dg Chest Portable 1 View 10/11/2013   CLINICAL DATA:  Atrial fibrillation and tachycardia.  EXAM: PORTABLE CHEST - 1 VIEW  COMPARISON:  Chest radiograph of 09/30/2013.  CT of 01/23/2009  FINDINGS: Midline trachea. Moderate cardiomegaly. Chronic right-sided pleural thickening blunts the costophrenic angle. Possible small left pleural effusion. No pneumothorax. Interstitial thickening is chronic. Patchy right upper lobe peripheral opacity has been present back to 01/10/2012. There is also chronic right base pulmonary opacity. Left lower lobe pulmonary opacity may be progressive since 09/30/2013. Grossly similar to 09/28/2013.  IMPRESSION: Cardiomegaly with chronic interstitial thickening. No overt congestive failure.  Multi focal pulmonary opacities. Primarily felt to represent areas of post infectious scarring. Cannot exclude progressive left lower lobe infection or aspiration.  Given chronicity, CT may be informative to re-evaluate the pulmonary opacities.   Electronically Signed   By: Abigail Miyamoto M.D.   On: 10/11/2013 13:33    ASSESSMENT AND PLAN:   The patient was seen today by Dr. Burt Knack, the patient evaluated and the data reviewed.  Principal Problem:   HCAP (healthcare-associated pneumonia) - ABX per IM  Active Problems:   HTN (hypertension) - per IM, good control on current rx    Atrial fibrillation - rate is OK despite acute illness. Continue Lopressor 50 mg BID. She is on Eliquis.    Acute diastolic heart failure - rec'd IV Lasix 40 mg in ER.  Think she needs more diuresis, will give her Lasix 40 mg IV BID, reassess daily.    Signed: Rosaria Ferries,  PA-C 10/11/2013 3:31 PM Beeper [phone]  Co-Sign MD  Patient seen, examined. Available data reviewed. Agree with findings, assessment, and plan as outlined by Rosaria Ferries, PA-C. The patient was independently interviewed and examined. Changes made above where appropriate. The patient has recurrent shortness of breath which I suspect is related to heart failure in the setting of atrial fibrillation. She has failed cardioversion. She is now anticoagulated with Eliquis. Would continue metoprolol for heart rate control. Agree with IV diuresis. She is very clear that she desires conservative care. If progressive symptoms, would be reasonable to involve palliative care.  Sherren Mocha, M.D. 10/11/2013 4:45 PM

## 2013-10-12 LAB — CBC
HCT: 33.1 % — ABNORMAL LOW (ref 36.0–46.0)
HEMOGLOBIN: 11.1 g/dL — AB (ref 12.0–15.0)
MCH: 31.3 pg (ref 26.0–34.0)
MCHC: 33.5 g/dL (ref 30.0–36.0)
MCV: 93.2 fL (ref 78.0–100.0)
PLATELETS: 281 10*3/uL (ref 150–400)
RBC: 3.55 MIL/uL — AB (ref 3.87–5.11)
RDW: 14 % (ref 11.5–15.5)
WBC: 6.7 10*3/uL (ref 4.0–10.5)

## 2013-10-12 LAB — BASIC METABOLIC PANEL
ANION GAP: 15 (ref 5–15)
BUN: 13 mg/dL (ref 6–23)
CHLORIDE: 95 meq/L — AB (ref 96–112)
CO2: 27 meq/L (ref 19–32)
Calcium: 7.9 mg/dL — ABNORMAL LOW (ref 8.4–10.5)
Creatinine, Ser: 0.6 mg/dL (ref 0.50–1.10)
GFR calc Af Amer: >90 mL/min (ref >90)
GFR calc non Af Amer: 81 mL/min — ABNORMAL LOW (ref >90)
Glucose, Bld: 92 mg/dL (ref 70–99)
POTASSIUM: 3.5 meq/L — AB (ref 3.7–5.3)
SODIUM: 137 meq/L (ref 137–147)

## 2013-10-12 LAB — TROPONIN I

## 2013-10-12 LAB — MAGNESIUM: Magnesium: 1.7 mg/dL (ref 1.5–2.5)

## 2013-10-12 MED ORDER — METOPROLOL TARTRATE 25 MG PO TABS
25.0000 mg | ORAL_TABLET | Freq: Once | ORAL | Status: AC
  Administered 2013-10-12: 25 mg via ORAL
  Filled 2013-10-12: qty 1

## 2013-10-12 MED ORDER — LEVALBUTEROL HCL 0.63 MG/3ML IN NEBU
0.6300 mg | INHALATION_SOLUTION | RESPIRATORY_TRACT | Status: DC | PRN
  Administered 2013-10-15 – 2013-10-16 (×3): 0.63 mg via RESPIRATORY_TRACT
  Filled 2013-10-12 (×3): qty 3

## 2013-10-12 MED ORDER — METOPROLOL TARTRATE 25 MG PO TABS
25.0000 mg | ORAL_TABLET | Freq: Two times a day (BID) | ORAL | Status: DC
  Administered 2013-10-13 – 2013-10-16 (×6): 25 mg via ORAL
  Filled 2013-10-12 (×10): qty 1

## 2013-10-12 MED ORDER — HYDROCERIN EX CREA
TOPICAL_CREAM | Freq: Every day | CUTANEOUS | Status: DC
  Administered 2013-10-12 – 2013-10-16 (×5): via TOPICAL
  Administered 2013-10-17: 1 via TOPICAL
  Administered 2013-10-18: 11:00:00 via TOPICAL
  Administered 2013-10-19: 1 via TOPICAL
  Administered 2013-10-20: 12:00:00 via TOPICAL
  Filled 2013-10-12: qty 113

## 2013-10-12 NOTE — Progress Notes (Signed)
SUBJECTIVE:  Breathing is not at baseline.   PHYSICAL EXAM Filed Vitals:   10/11/13 1430 10/11/13 1530 10/11/13 2056 10/12/13 0608  BP: 121/81 127/78 117/62 90/50  Pulse: 107 107 127 115  Temp:  98.7 F (37.1 C) 98.3 F (36.8 C) 99.5 F (37.5 C)  TempSrc:  Oral Oral Oral  Resp: 22 21 20 20   Height:  5\' 2"  (1.575 m)    Weight:  203 lb (92.08 kg)  199 lb 11.8 oz (90.6 kg)  SpO2: 92% 96% 91% 93%   General:  No distress Lungs:  Bilateral coarse crackles Heart:  Irregular Abdomen:  Positive bowel sounds, no rebound no guarding Extremities:  Right greater than left edema   LABS: Lab Results  Component Value Date   TROPONINI <0.30 10/12/2013   Results for orders placed during the hospital encounter of 10/11/13 (from the past 24 hour(s))  PRO B NATRIURETIC PEPTIDE     Status: Abnormal   Collection Time    10/11/13 12:03 PM      Result Value Ref Range   Pro B Natriuretic peptide (BNP) 2942.0 (*) 0 - 450 pg/mL  BASIC METABOLIC PANEL     Status: Abnormal   Collection Time    10/11/13 12:43 PM      Result Value Ref Range   Sodium 133 (*) 137 - 147 mEq/L   Potassium 4.3  3.7 - 5.3 mEq/L   Chloride 94 (*) 96 - 112 mEq/L   CO2 26  19 - 32 mEq/L   Glucose, Bld 104 (*) 70 - 99 mg/dL   BUN 16  6 - 23 mg/dL   Creatinine, Ser 0.64  0.50 - 1.10 mg/dL   Calcium 8.4  8.4 - 10.5 mg/dL   GFR calc non Af Amer 79 (*) >90 mL/min   GFR calc Af Amer >90  >90 mL/min   Anion gap 13  5 - 15  CBC WITH DIFFERENTIAL     Status: Abnormal   Collection Time    10/11/13 12:43 PM      Result Value Ref Range   WBC 7.8  4.0 - 10.5 K/uL   RBC 3.90  3.87 - 5.11 MIL/uL   Hemoglobin 11.8 (*) 12.0 - 15.0 g/dL   HCT 36.2  36.0 - 46.0 %   MCV 92.8  78.0 - 100.0 fL   MCH 30.3  26.0 - 34.0 pg   MCHC 32.6  30.0 - 36.0 g/dL   RDW 14.1  11.5 - 15.5 %   Platelets 337  150 - 400 K/uL   Neutrophils Relative % 71  43 - 77 %   Neutro Abs 5.5  1.7 - 7.7 K/uL   Lymphocytes Relative 14  12 - 46 %   Lymphs  Abs 1.1  0.7 - 4.0 K/uL   Monocytes Relative 14 (*) 3 - 12 %   Monocytes Absolute 1.1 (*) 0.1 - 1.0 K/uL   Eosinophils Relative 1  0 - 5 %   Eosinophils Absolute 0.1  0.0 - 0.7 K/uL   Basophils Relative 0  0 - 1 %   Basophils Absolute 0.0  0.0 - 0.1 K/uL  I-STAT TROPOININ, ED     Status: None   Collection Time    10/11/13 12:57 PM      Result Value Ref Range   Troponin i, poc 0.01  0.00 - 0.08 ng/mL   Comment 3           URINALYSIS, ROUTINE W REFLEX  MICROSCOPIC     Status: Abnormal   Collection Time    10/11/13  1:25 PM      Result Value Ref Range   Color, Urine YELLOW  YELLOW   APPearance CLEAR  CLEAR   Specific Gravity, Urine 1.011  1.005 - 1.030   pH 6.5  5.0 - 8.0   Glucose, UA NEGATIVE  NEGATIVE mg/dL   Hgb urine dipstick TRACE (*) NEGATIVE   Bilirubin Urine NEGATIVE  NEGATIVE   Ketones, ur NEGATIVE  NEGATIVE mg/dL   Protein, ur NEGATIVE  NEGATIVE mg/dL   Urobilinogen, UA 1.0  0.0 - 1.0 mg/dL   Nitrite NEGATIVE  NEGATIVE   Leukocytes, UA TRACE (*) NEGATIVE  URINE MICROSCOPIC-ADD ON     Status: Abnormal   Collection Time    10/11/13  1:25 PM      Result Value Ref Range   Squamous Epithelial / LPF FEW (*) RARE   WBC, UA 3-6  <3 WBC/hpf   RBC / HPF 0-2  <3 RBC/hpf   Bacteria, UA RARE  RARE  TROPONIN I     Status: None   Collection Time    10/11/13  7:43 PM      Result Value Ref Range   Troponin I <0.30  <0.30 ng/mL  TROPONIN I     Status: None   Collection Time    10/12/13  1:26 AM      Result Value Ref Range   Troponin I <0.30  <0.30 ng/mL  CBC     Status: Abnormal   Collection Time    10/12/13  4:04 AM      Result Value Ref Range   WBC 6.7  4.0 - 10.5 K/uL   RBC 3.55 (*) 3.87 - 5.11 MIL/uL   Hemoglobin 11.1 (*) 12.0 - 15.0 g/dL   HCT 33.1 (*) 36.0 - 46.0 %   MCV 93.2  78.0 - 100.0 fL   MCH 31.3  26.0 - 34.0 pg   MCHC 33.5  30.0 - 36.0 g/dL   RDW 14.0  11.5 - 15.5 %   Platelets 281  150 - 400 K/uL  BASIC METABOLIC PANEL     Status: Abnormal   Collection  Time    10/12/13  4:04 AM      Result Value Ref Range   Sodium 137  137 - 147 mEq/L   Potassium 3.5 (*) 3.7 - 5.3 mEq/L   Chloride 95 (*) 96 - 112 mEq/L   CO2 27  19 - 32 mEq/L   Glucose, Bld 92  70 - 99 mg/dL   BUN 13  6 - 23 mg/dL   Creatinine, Ser 0.60  0.50 - 1.10 mg/dL   Calcium 7.9 (*) 8.4 - 10.5 mg/dL   GFR calc non Af Amer 81 (*) >90 mL/min   GFR calc Af Amer >90  >90 mL/min   Anion gap 15  5 - 15  MAGNESIUM     Status: None   Collection Time    10/12/13  4:04 AM      Result Value Ref Range   Magnesium 1.7  1.5 - 2.5 mg/dL    Intake/Output Summary (Last 24 hours) at 10/12/13 0759 Last data filed at 10/12/13 0700  Gross per 24 hour  Intake    330 ml  Output   2000 ml  Net  -1670 ml    ASSESSMENT AND PLAN:  ATRIAL FIB:  Continue rate control and anticoagulation.  Continue current therapy.  ACUTE ON  DIASTOLIC HF:    Agree with current meds for IV diuresis.  We will follow.      Jeneen Rinks [facility] 10/12/2013 7:59 AM

## 2013-10-12 NOTE — Progress Notes (Signed)
10/12/2013 1030 Nursing note Pt bp low this am 97/65 and 83/56. Pt. Sitting in chair, asymptomatic states she feels fine. HR 115 Afib. Dr. Percival Spanish paged and made aware. Verbal orders to hold am metoprolol and losartan. Orders enacted. will continue to closely monitor patient.  [name], Arville Lime

## 2013-10-12 NOTE — Clinical Social Work Placement (Addendum)
Clinical Social Work Department CLINICAL SOCIAL WORK PLACEMENT NOTE 10/12/2013  Patient:  Julie Haas, Julie Haas  Account Number:  [id number] Admit date:  10/11/2013  Clinical Social Worker:  Barbette Or, LCSW  Date/time:  10/12/2013 05:00 PM  Clinical Social Work is seeking post-discharge placement for this patient at the following level of care:   [place]   (*CSW will update this form in Epic as items are completed)   10/12/2013  Patient/family provided with [facility] Department of Clinical Social Work's list of facilities offering this level of care within the geographic area requested by the patient (or if unable, by the patient's family).  10/12/2013  Patient/family informed of their freedom to choose among providers that offer the needed level of care, that participate in Medicare, Medicaid or managed care program needed by the patient, have an available bed and are willing to accept the patient.  10/12/2013  Patient/family informed of [facility]' ownership interest in [facility], as well as of the fact that they are under no obligation to receive care at this facility.  PASARR submitted to EDS on 10/12/2013 PASARR number received on 10/12/2013  FL2 transmitted to all facilities in geographic area requested by pt/family on  10/12/2013 FL2 transmitted to all facilities within larger geographic area on   Patient informed that his/her managed care company has contracts with or will negotiate with  certain facilities, including the following:     Patient/family informed of bed offers received:  10/12/2013 Patient chooses bed at [facility] Physician recommends and patient chooses bed at    Patient to be transferred to [facility] on  10/20/2013 Patient to be transferred to facility by  Daughter Patient and family notified of transfer on 10/20/2013 Name of family member notified:  Patient daughter at bedside Rod Holler)  The following physician request  were entered in Epic:   Additional Comments:

## 2013-10-12 NOTE — Progress Notes (Signed)
10/12/2013 4:32 PM Nursing note RN noted pt. HR elevated at 115-130 sustaining Afib. Pt. Asymptomatic. PT BP 108/75. Dr. Doyle Askew paged and made aware of findings. Verbal orders received for 25 mg Metoprolol PO x 1. Orders enacted. Will continue to closely monitor patient.  Nalany Steedley, Arville Lime

## 2013-10-12 NOTE — Clinical Social Work Note (Signed)
Clinical Social Work Department BRIEF PSYCHOSOCIAL ASSESSMENT 10/12/2013  Patient:  Julie Haas, Julie Haas     Account Number:  [id number]     Admit date:  10/11/2013  Clinical Social Worker:  Myles Lipps  Date/Time:  10/12/2013 04:00 PM  Referred by:  Care Management  Date Referred:  10/12/2013 Referred for  SNF Placement   Other Referral:   Interview type:  Patient Other interview type:    PSYCHOSOCIAL DATA Living Status:  FACILITY Admitted from facility:  [facility] Level of care:  Independent Living Primary support name:  Rosello,E.T.  [phone] Primary support relationship to patient:  CHILD, ADULT Degree of support available:   Strong    CURRENT CONCERNS Current Concerns  Post-Acute Placement   Other Concerns:    SOCIAL WORK ASSESSMENT / PLAN Clinical Social Worker met with patient at bedside to offer support and discuss patient needs at discharge.  Patient states that she is a current resident at [facility] independent living and has been living there for almost 10 years.  Patient states that she would prefer to return to independent living but is agreeable to go to short term rehab if recommended at discharge.  CSW spoke with facility admissions coordinator who states that patient will have a bed available if needed once medically ready.  CSW remains available for support and to facilitate patient discharge needs once medically stable.   Assessment/plan status:  Psychosocial Support/Ongoing Assessment of Needs Other assessment/ plan:   Information/referral to community resources:   Clinical Social Worker spoke in depth with patient about the benefits of a potential short term rehab stay.  Patient states that she is comfortable in her home but recognizes the need for further assistance.  Patient offered alternative facility options, however patient adamantly requests bed at [facility].    PATIENT'S/FAMILY'S RESPONSE TO PLAN OF CARE: Patient  alert and oriented x3 sitting on the edge of the bed awaiting to use the restroom.  Patient does not mention family support but does state that many people from [facility] offer a good amount of support as needed.  Patient is agreeable with SNF placement pending PT recommendations. Patient verbalized her understanding of CSW role and appreciation for support and concern.

## 2013-10-12 NOTE — Progress Notes (Addendum)
Patient ID: Julie Haas, female   DOB: 1925-12-01, 80 y.o.   MRN: [id number]  [facility] PROGRESS NOTE  SEARRA CARNATHAN CBS:[id number] DOB: [birth year] DOA: 10/11/2013 PCP: Estill Dooms, MD  Brief narrative: 80 y.o. female with HTN, atrial fibrillation on [facility], recent admission for PNA s/p TEE/DCCV (discharged on 8/15) and presented to [facility] ED with main concern of progressively worsening shortness of breath, mostly exertional but occasionally present at rest. This as need associated with productive cough of yellow sputum, subjective fevers, chills, LE edema and 2 pillow orthopnea.   Assessment and Plan:    Principal Problem:   Acute hypoxic respiratory failure  - secondary to HCAP - pt clinically improving and denies shortness of breat this AM - continue Vanc and Aztreonam day #2/7 - please note below recommendation to have CT chest repeated in 6-8 weeks to re-evaluate the areas of nodularity    HCAP (healthcare-associated pneumonia) - ABX as noted above  Active Problems:   HTN (hypertension) - soft BP this AM - will lower the dose of Metoprolol to 25 mg PO - may need to lower the dose of Lasix as well as pt appears to be diuresing well  - stop losartan for now until BP stabilizes    Atrial fibrillation with RVR - continue Epiquis - also continue Metoprolol but will lower the dose from 50 mg PO BID to 25 mg PO BID tablet due to soft SBP 80 - 90's - replace albuterol with levalbuterol as it may not aggravate a-fib as much    Acute on chronic diastolic heart failure - pt with mild crackles on exam - weight is trending down since admission: 204 --> 199 lbs this AM - continue lasix 40 mg IV BID   Chronic left leg stasis ulcer  - continue wound care     Moderate malnutrition - advance diet as pt able to tolerate    Hypokalemia - mild, will supplement and repeat BMP in AM   Anemia of chronic disease - Hg and Hct stable with no signs of active bleeding - repeat CBC in  AM  DVT prophylaxis  Eliquis for atrial fibrillation   Code Status: DNR Family Communication: Pt at bedside Disposition Plan: Home when medically stable  IV Access:   Peripheral IV Procedures and diagnostic studies:   Ct Chest Wo Contrast   10/11/2013  Patchy bilateral airspace process with nodularity most prominent over the right upper lobe likely due to infection. Patchy bronchiectatic change as described. Small bilateral pleural effusions. 1.3 cm pretracheal lymph node likely reactive. Recommend followup CT in 6-8 weeks to re-evaluate the areas of nodularity.  Cardiomegaly with calcification of the mitral valve annulus and left atrial enlargement. Dg Chest Portable 1 View  10/11/2013   Cardiomegaly with chronic interstitial thickening. No overt congestive failure.  Multi focal pulmonary opacities. Primarily felt to represent areas of post infectious scarring. Cannot exclude progressive left lower lobe infection or aspiration.  Medical Consultants:   Cardiology  Other Consultants:   Physical therapy  Anti-Infectives:   Vancomycin 8/24 --> Aztreonam 8/24 -->  Faye Ramsay, MD  [facility] Pager [phone]  If 7PM-7AM, please contact night-coverage [email or web address] Password [email or web address] 10/12/2013, 7:08 PM   LOS: 1 day   HPI/Subjective: No events overnight.   Objective: Filed Vitals:   10/12/13 0911 10/12/13 1019 10/12/13 1310 10/12/13 1550  BP: 97/65 83/56 104/45 108/65  Pulse:  96 46 130  Temp:   98.5 F (36.9 C)   TempSrc:  Oral   Resp:  18 18   Height:      Weight:      SpO2:   96%     Intake/Output Summary (Last 24 hours) at 10/12/13 1908 Last data filed at 10/12/13 1630  Gross per 24 hour  Intake    810 ml  Output   2000 ml  Net  -1190 ml    Exam:   General:  Pt is alert, follows commands appropriately, not in acute distress  Cardiovascular: Irregular rate and rhythm, no rubs, no gallops  Respiratory: Course breath sounds with bibasilar crackles   Abdomen:  Soft, non tender, non distended, bowel sounds present, no guarding  Data Reviewed: Basic Metabolic Panel:  Recent Labs Lab 10/11/13 1243 10/12/13 0404  NA 133* 137  K 4.3 3.5*  CL 94* 95*  CO2 26 27  GLUCOSE 104* 92  BUN 16 13  CREATININE 0.64 0.60  CALCIUM 8.4 7.9*  MG  --  1.7   CBC:  Recent Labs Lab 10/11/13 1243 10/12/13 0404  WBC 7.8 6.7  NEUTROABS 5.5  --   HGB 11.8* 11.1*  HCT 36.2 33.1*  MCV 92.8 93.2  PLT 337 281   Cardiac Enzymes:  Recent Labs Lab 10/11/13 1943 10/12/13 0126 10/12/13 0940  TROPONINI <0.30 <0.30 <0.30   Recent Results (from the past 240 hour(s))  URINE CULTURE     Status: None   Collection Time    10/11/13  1:25 PM      Result Value Ref Range Status   Specimen Description URINE, CLEAN CATCH   Final   Special Requests NONE   Final   Culture  Setup Time     Final   Value: 10/11/2013 20:09     Performed at [place] PENDING   Incomplete   Culture     Final   Value: Culture reincubated for better growth     Performed at [facility]   Report Status PENDING   Incomplete     Scheduled Meds: . apixaban  5 mg Oral BID  . aztreonam  1 g Intravenous Q8H  . furosemide  40 mg Intravenous BID  . hydrocerin   Topical Daily  . losartan  50 mg Oral Daily  . metoprolol  50 mg Oral BID  . potassium chloride SA  20 mEq Oral Daily  . vancomycin  750 mg Intravenous Q12H  . vitamin E  1,000 Units Oral Daily   Continuous Infusions:

## 2013-10-12 NOTE — Consult Note (Addendum)
WOC wound consult note Reason for Consult: Pt states she has been followed by the outpatient wound care center in [facility] until last week for a chronic left leg stasis ulcer.  She was wearing an IT trainer and this was discontinued  "about a week ago" since wound had healed. Wound type: Dry red scar tissue to left calf 2X1cm; no open wound or drainage.  No topical treatment indicated at this time. Periwound: Generalized edema and erythremia to left leg; dry scabbed plaque on skin which remove easily. Dressing procedure/placement/frequency: Eucerin cream to assist with removal of dry skin areas.  Discussed plan of care with daughter at bedside and patient who verbalizes understanding. Please re-consult if further assistance is needed.  Thank-you,  Julien Girt MSN, [place], [place], [place], [place]

## 2013-10-13 DIAGNOSIS — S81809A Unspecified open wound, unspecified lower leg, initial encounter: Secondary | ICD-10-CM

## 2013-10-13 DIAGNOSIS — Z5189 Encounter for other specified aftercare: Secondary | ICD-10-CM

## 2013-10-13 LAB — BASIC METABOLIC PANEL
Anion gap: 10 (ref 5–15)
BUN: 11 mg/dL (ref 6–23)
CALCIUM: 8 mg/dL — AB (ref 8.4–10.5)
CHLORIDE: 96 meq/L (ref 96–112)
CO2: 30 mEq/L (ref 19–32)
Creatinine, Ser: 0.52 mg/dL (ref 0.50–1.10)
GFR calc Af Amer: >90 mL/min (ref >90)
GFR calc non Af Amer: 85 mL/min — ABNORMAL LOW (ref >90)
Glucose, Bld: 88 mg/dL (ref 70–99)
Potassium: 3.4 mEq/L — ABNORMAL LOW (ref 3.7–5.3)
Sodium: 136 mEq/L — ABNORMAL LOW (ref 137–147)

## 2013-10-13 LAB — CBC
HCT: 32.7 % — ABNORMAL LOW (ref 36.0–46.0)
Hemoglobin: 10.9 g/dL — ABNORMAL LOW (ref 12.0–15.0)
MCH: 30.9 pg (ref 26.0–34.0)
MCHC: 33.3 g/dL (ref 30.0–36.0)
MCV: 92.6 fL (ref 78.0–100.0)
PLATELETS: 293 10*3/uL (ref 150–400)
RBC: 3.53 MIL/uL — ABNORMAL LOW (ref 3.87–5.11)
RDW: 13.9 % (ref 11.5–15.5)
WBC: 6.8 10*3/uL (ref 4.0–10.5)

## 2013-10-13 MED ORDER — VANCOMYCIN HCL 10 G IV SOLR
1250.0000 mg | INTRAVENOUS | Status: DC
  Administered 2013-10-13: 1250 mg via INTRAVENOUS
  Filled 2013-10-13 (×2): qty 1250

## 2013-10-13 MED ORDER — POTASSIUM CHLORIDE CRYS ER 20 MEQ PO TBCR
40.0000 meq | EXTENDED_RELEASE_TABLET | Freq: Once | ORAL | Status: AC
  Administered 2013-10-13: 40 meq via ORAL
  Filled 2013-10-13: qty 2

## 2013-10-13 NOTE — Progress Notes (Signed)
    SUBJECTIVE:  Breathing is not at baseline.   PHYSICAL EXAM Filed Vitals:   10/12/13 1550 10/12/13 2105 10/12/13 2354 10/13/13 0525  BP: 108/65 96/67 95/62  97/79  Pulse: 130 118  115  Temp:  98 F (36.7 C)  97.8 F (36.6 C)  TempSrc:  Oral  Oral  Resp:  18  18  Height:      Weight:    198 lb 3.2 oz (89.903 kg)  SpO2:  94%  94%   General:  No distress Lungs:  Bilateral coarse crackles Heart:  Irregular Abdomen:  Positive bowel sounds, no rebound no guarding Extremities:  Right greater than left edema   LABS: Lab Results  Component Value Date   TROPONINI <0.30 10/12/2013   Results for orders placed during the hospital encounter of 10/11/13 (from the past 24 hour(s))  TROPONIN I     Status: None   Collection Time    10/12/13  9:40 AM      Result Value Ref Range   Troponin I <0.30  <0.30 ng/mL  CBC     Status: Abnormal   Collection Time    10/13/13  5:15 AM      Result Value Ref Range   WBC 6.8  4.0 - 10.5 K/uL   RBC 3.53 (*) 3.87 - 5.11 MIL/uL   Hemoglobin 10.9 (*) 12.0 - 15.0 g/dL   HCT 32.7 (*) 36.0 - 46.0 %   MCV 92.6  78.0 - 100.0 fL   MCH 30.9  26.0 - 34.0 pg   MCHC 33.3  30.0 - 36.0 g/dL   RDW 13.9  11.5 - 15.5 %   Platelets 293  150 - 400 K/uL  BASIC METABOLIC PANEL     Status: Abnormal   Collection Time    10/13/13  5:15 AM      Result Value Ref Range   Sodium 136 (*) 137 - 147 mEq/L   Potassium 3.4 (*) 3.7 - 5.3 mEq/L   Chloride 96  96 - 112 mEq/L   CO2 30  19 - 32 mEq/L   Glucose, Bld 88  70 - 99 mg/dL   BUN 11  6 - 23 mg/dL   Creatinine, Ser 0.52  0.50 - 1.10 mg/dL   Calcium 8.0 (*) 8.4 - 10.5 mg/dL   GFR calc non Af Amer 85 (*) >90 mL/min   GFR calc Af Amer >90  >90 mL/min   Anion gap 10  5 - 15    Intake/Output Summary (Last 24 hours) at 10/13/13 0755 Last data filed at 10/13/13 0600  Gross per 24 hour  Intake    680 ml  Output   1350 ml  Net   -670 ml    ASSESSMENT AND PLAN:  ATRIAL FIB:  Continue rate control and  anticoagulation.   Heart rate up.  Her blood pressure required that her beta blocker and ARB were held yesterday.  ARB is discontinued and beta blocker reduced.    ACUTE ON DIASTOLIC HF:   She has been hypotensive.  I am not sure that the I/Os are accurate.  I will stop the IV diuresis and we can consider resuming in the AM PO.   I will supplement the potassium this AM.    Minus Breeding 10/13/2013 7:55 AM

## 2013-10-13 NOTE — Progress Notes (Signed)
Patient ID: Julie Haas, female   DOB: 07-Jan-1926, 80 y.o.   MRN: [id number]  [facility] PROGRESS NOTE  KELSA JAWOROWSKI [id number] DOB: September 16, 1925 DOA: 10/11/2013 PCP: Estill Dooms, MD  Brief narrative: 80 y.o. female with HTN, atrial fibrillation on [facility], recent admission for PNA s/p TEE/DCCV (discharged on 8/15) and presented to [facility] ED with main concern of progressively worsening shortness of breath, mostly exertional but occasionally present at rest. This as need associated with productive cough of yellow sputum, subjective fevers, chills, LE edema and 2 pillow orthopnea.   Assessment and Plan:      Acute hypoxic respiratory failure  - secondary to HCAP - pt clinically improving and denies shortness of breat this AM - continue Vanc and Aztreonam day #3/7 -  recommendation to have CT chest repeated in 6-8 weeks to re-evaluate the areas of nodularity     HCAP (healthcare-associated pneumonia) - ABX as noted above     HTN (hypertension) - defer to cards    Atrial fibrillation with RVR - continue Epiquis - Her blood pressure required that her beta blocker and ARB were held yesterday. ARB is discontinued and beta blocker reduced.     Acute on chronic diastolic heart failure - pt with mild crackles on exam - weight is trending down since admission: 204 --> 199 lbs this AM - lasix per cards    Chronic left leg stasis ulcer  - continue wound care      Moderate malnutrition - advance diet as pt able to tolerate     Hypokalemia - mild, supplement and repeat BMP in AM    Anemia of chronic disease - Hg and Hct stable with no signs of active bleeding - repeat CBC in AM  DVT prophylaxis  Eliquis for atrial fibrillation   Code Status: DNR Family Communication: Pt at bedside/ddaughter Disposition Plan: Home when medically stable  IV Access:   Peripheral IV Procedures and diagnostic studies:   Ct Chest Wo Contrast   10/11/2013  Patchy bilateral airspace process  with nodularity most prominent over the right upper lobe likely due to infection. Patchy bronchiectatic change as described. Small bilateral pleural effusions. 1.3 cm pretracheal lymph node likely reactive. Recommend followup CT in 6-8 weeks to re-evaluate the areas of nodularity.  Cardiomegaly with calcification of the mitral valve annulus and left atrial enlargement. Dg Chest Portable 1 View  10/11/2013   Cardiomegaly with chronic interstitial thickening. No overt congestive failure.  Multi focal pulmonary opacities. Primarily felt to represent areas of post infectious scarring. Cannot exclude progressive left lower lobe infection or aspiration.  Medical Consultants:   Cardiology  Other Consultants:   Physical therapy  Anti-Infectives:   Vancomycin 8/24 --> Aztreonam 8/24 -->  Andreanna Mikolajczak,DO  [facility] Pager [phone]  If 7PM-7AM, please contact night-coverage [email or web address] Password [email or web address] 10/13/2013, 11:01 AM   LOS: 2 days   HPI/Subjective: Ate well this AM  Objective: Filed Vitals:   10/12/13 2105 10/12/13 2354 10/13/13 0525 10/13/13 1018  BP: 96/67 95/62 97/79  112/68  Pulse: 118  115 140  Temp: 98 F (36.7 C)  97.8 F (36.6 C)   TempSrc: Oral  Oral   Resp: 18  18 18   Height:      Weight:   89.903 kg (198 lb 3.2 oz)   SpO2: 94%  94% 97%    Intake/Output Summary (Last 24 hours) at 10/13/13 1101 Last data filed at 10/13/13 0730  Gross per 24 hour  Intake  920 ml  Output   1901 ml  Net   -981 ml    Exam:   General:  Pt is alert, follows commands appropriately, not in acute distress  Cardiovascular: Irregular rate and rhythm, no rubs, no gallops  Respiratory: Course breath sounds with bibasilar crackles   Abdomen: Soft, non tender, non distended, bowel sounds present, no guarding  Data Reviewed: Basic Metabolic Panel:  Recent Labs Lab 10/11/13 1243 10/12/13 0404 10/13/13 0515  NA 133* 137 136*  K 4.3 3.5* 3.4*  CL 94* 95* 96  CO2 26 27 30   GLUCOSE 104*  92 88  BUN 16 13 11   CREATININE 0.64 0.60 0.52  CALCIUM 8.4 7.9* 8.0*  MG  --  1.7  --    CBC:  Recent Labs Lab 10/11/13 1243 10/12/13 0404 10/13/13 0515  WBC 7.8 6.7 6.8  NEUTROABS 5.5  --   --   HGB 11.8* 11.1* 10.9*  HCT 36.2 33.1* 32.7*  MCV 92.8 93.2 92.6  PLT 337 281 293   Cardiac Enzymes:  Recent Labs Lab 10/11/13 1943 10/12/13 0126 10/12/13 0940  TROPONINI <0.30 <0.30 <0.30   Recent Results (from the past 240 hour(s))  URINE CULTURE     Status: None   Collection Time    10/11/13  1:25 PM      Result Value Ref Range Status   Specimen Description URINE, CLEAN CATCH   Final   Special Requests NONE   Final   Culture  Setup Time     Final   Value: 10/11/2013 20:09     Performed at [place] PENDING   Incomplete   Culture     Final   Value: Culture reincubated for better growth     Performed at [facility]   Report Status PENDING   Incomplete  CULTURE, BLOOD (ROUTINE X 2)     Status: None   Collection Time    10/11/13  7:33 PM      Result Value Ref Range Status   Specimen Description BLOOD RIGHT ARM   Final   Special Requests BOTTLES DRAWN AEROBIC AND ANAEROBIC 10CC   Final   Culture  Setup Time     Final   Value: 10/12/2013 04:12     Performed at [facility]   Culture     Final   Value:        BLOOD CULTURE RECEIVED NO GROWTH TO DATE CULTURE WILL BE HELD FOR 5 DAYS BEFORE ISSUING A FINAL NEGATIVE REPORT     Performed at [facility]   Report Status PENDING   Incomplete  CULTURE, BLOOD (ROUTINE X 2)     Status: None   Collection Time    10/11/13  7:43 PM      Result Value Ref Range Status   Specimen Description BLOOD LEFT ARM   Final   Special Requests BOTTLES DRAWN AEROBIC AND ANAEROBIC 5CC   Final   Culture  Setup Time     Final   Value: 10/12/2013 04:13     Performed at [facility]   Culture     Final   Value:        BLOOD CULTURE RECEIVED NO GROWTH TO DATE CULTURE WILL BE HELD FOR 5  DAYS BEFORE ISSUING A FINAL NEGATIVE REPORT     Performed at [facility]   Report Status PENDING   Incomplete     Scheduled Meds: . apixaban  5 mg Oral BID  . aztreonam  1 g Intravenous Q8H  . furosemide  40 mg Intravenous BID  . hydrocerin   Topical Daily  . losartan  50 mg Oral Daily  . metoprolol  50 mg Oral BID  . potassium chloride SA  20 mEq Oral Daily  . vancomycin  750 mg Intravenous Q12H  . vitamin E  1,000 Units Oral Daily   Continuous Infusions:

## 2013-10-13 NOTE — Progress Notes (Signed)
Patient alert and oriented, daughter at bedside, ambulates with walker or 1 assist, o2 weaned to 2 liters instead of 3, desats while ambulating to RR ra 89, tachy high 140 today, asymptomatic, afib

## 2013-10-13 NOTE — Progress Notes (Signed)
Patient H.R. While resting 110-120 At. Fib. WITH ACT. 130'S

## 2013-10-13 NOTE — Care Management Note (Signed)
    Page 1 of 1   10/20/2013     3:28:43 PM CARE MANAGEMENT NOTE 10/20/2013  Patient:  Julie Haas, Julie Haas   Account Number:  [id number]  Date Initiated:  10/13/2013  Documentation initiated by:  Stevens Magwood  Subjective/Objective Assessment:   Pt adm on 10/11/13 with PNA, afib, UTI.  PTA, pt resides at [place].     Action/Plan:   PT eval pending; will likely need SNF at [facility] at dc.  Will follow progress.   Anticipated DC Date:  10/20/2013   Anticipated DC Plan:  SKILLED NURSING FACILITY  In-house referral  Clinical Social Worker      DC Planning Services  CM consult      Choice offered to / List presented to:             Status of service:  Completed, signed off Medicare Important Message given?  YES (If response is "NO", the following Medicare IM given date fields will be blank) Date Medicare IM given:  10/14/2013 Medicare IM given by:  Courtnie Brenes Date Additional Medicare IM given:  10/19/2013 Additional Medicare IM given by:  Ezekeil Bethel  Discharge Disposition:  [place]  Per UR Regulation:  Reviewed for med. necessity/level of care/duration of stay  If discussed at [place] of Stay Meetings, dates discussed:   10/19/2013    Comments:  10/20/13 Ellan Lambert, RN, BSN [phone] Pt dc to SNF today, per CSW arrangements.  10/15/13 Ellan Lambert, RN, BSN [phone] Pt will need and is agreeable to SNF at dc.  CSW following to facilitate this.

## 2013-10-13 NOTE — Progress Notes (Signed)
Lopressor  Held for low B.P.

## 2013-10-14 DIAGNOSIS — R0902 Hypoxemia: Secondary | ICD-10-CM

## 2013-10-14 DIAGNOSIS — I5033 Acute on chronic diastolic (congestive) heart failure: Secondary | ICD-10-CM

## 2013-10-14 DIAGNOSIS — I509 Heart failure, unspecified: Secondary | ICD-10-CM

## 2013-10-14 LAB — PRO B NATRIURETIC PEPTIDE: Pro B Natriuretic peptide (BNP): 2071 pg/mL — ABNORMAL HIGH (ref 0–450)

## 2013-10-14 MED ORDER — AMIODARONE HCL 200 MG PO TABS
400.0000 mg | ORAL_TABLET | Freq: Three times a day (TID) | ORAL | Status: DC
  Administered 2013-10-14 – 2013-10-15 (×4): 400 mg via ORAL
  Filled 2013-10-14 (×6): qty 2

## 2013-10-14 MED ORDER — VANCOMYCIN HCL IN DEXTROSE 750-5 MG/150ML-% IV SOLN
750.0000 mg | Freq: Two times a day (BID) | INTRAVENOUS | Status: DC
  Administered 2013-10-14 – 2013-10-17 (×7): 750 mg via INTRAVENOUS
  Filled 2013-10-14 (×7): qty 150

## 2013-10-14 NOTE — Discharge Instructions (Signed)

## 2013-10-14 NOTE — Progress Notes (Signed)
    SUBJECTIVE:  Breathing is not at baseline.  She has a better mood today and slept well   PHYSICAL EXAM Filed Vitals:   10/13/13 2000 10/14/13 0000 10/14/13 0559 10/14/13 0800  BP:   91/53   Pulse:   123 146  Temp:   98.3 F (36.8 C)   TempSrc:   Oral   Resp: 20 16 18    Height:      Weight:   197 lb 8 oz (89.585 kg)   SpO2:   91%    General:  No distress Lungs:  Bilateral coarse crackles Heart:  Irregular Abdomen:  Positive bowel sounds, no rebound no guarding Extremities:  Right greater than left edema   LABS: Lab Results  Component Value Date   TROPONINI <0.30 10/12/2013   No results found for this or any previous visit (from the past 24 hour(s)).  Intake/Output Summary (Last 24 hours) at 10/14/13 1036 Last data filed at 10/13/13 2223  Gross per 24 hour  Intake   1023 ml  Output   1600 ml  Net   -577 ml    ASSESSMENT AND PLAN:  ATRIAL FIB:  Continue rate control and anticoagulation.   Heart rate up.   Her BP is precluding adequate rate control.  She needs another attempt at rhythm control in the future.  She needs maximal recovery of her pulmonary function however before this would have any chance of long term success.  I would will add PO amiodarone although I am concerned about her lung function.  I will suggest a pulmonary consult.  ACUTE ON DIASTOLIC HF:   She has been hypotensive.  I am not sure that the I/Os are accurate.  I stopped the IV diuresis .   Check another BNP today to see where we are.   Jeneen Rinks [facility] 10/14/2013 10:36 AM

## 2013-10-14 NOTE — Clinical Social Work Note (Signed)
Clinical Social Worker continuing to follow patient for support and discharge planning needs.  CSW left message for facility admissions coordinator to update on patient current discharge plans.  Patient continues to have a bed available at [facility] SNF for when medically ready.  CSW remains available for support and to facilitate patient discharge needs once medically stable.  Barbette Or, [place]

## 2013-10-14 NOTE — Progress Notes (Addendum)
ANTIBIOTIC CONSULT NOTE - FOLLOW UP  Pharmacy Consult for Vanco/Aztreonam Indication: pneumonia (HCAP)  Allergies  Allergen Reactions  . Codeine     Unknown  . Cucumber Extract Nausea And Vomiting  . Diuretic [Buchu-[place]sil[place]-Ch Grass-Hydran]     Unknown  . Lipitor [Atorvastatin Calcium]     Unknown  . Penicillins Other (See Comments)    unknown  . Pravachol     Unknown    Patient Measurements: Height: 5\' 2"  (157.5 cm) Weight: 197 lb 8 oz (89.585 kg) IBW/kg (Calculated) : 50.1 Adjusted Body Weight:    Vital Signs: Temp: 98.3 F (36.8 C) (08/27 0559) Temp src: Oral (08/27 0559) BP: 91/53 mmHg (08/27 0559) Pulse Rate: 146 (08/27 0800) Intake/Output from previous day: 08/26 0701 - 08/27 0700 In: 1313 [P.O.:960; I.V.:3; IV Piggyback:350] Out: 2151 [Urine:2150; Stool:1] Intake/Output from this shift:    Labs:  Recent Labs  10/11/13 1243 10/12/13 0404 10/13/13 0515  WBC 7.8 6.7 6.8  HGB 11.8* 11.1* 10.9*  PLT 337 281 293  CREATININE 0.64 0.60 0.52   Estimated Creatinine Clearance: 53.5 ml/min (by C-G formula based on Cr of 0.52). No results found for this basename: Letta Median, VANCORANDOM, GENTTROUGH, GENTPEAK, GENTRANDOM, TOBRATROUGH, TOBRAPEAK, TOBRARND, AMIKACINPEAK, AMIKACINTROU, AMIKACIN,  in the last 72 hours   Microbiology: Recent Results (from the past 720 hour(s))  URINE CULTURE     Status: None   Collection Time    09/26/13 10:21 AM      Result Value Ref Range Status   Specimen Description URINE, CATHETERIZED   Final   Special Requests NONE   Final   Culture  Setup Time     Final   Value: 09/26/2013 18:53     Performed at [place]     Final   Value: NO GROWTH     Performed at [facility]   Culture     Final   Value: NO GROWTH     Performed at [facility]   Report Status 09/27/2013 FINAL   Final  URINE CULTURE     Status: None   Collection Time    10/11/13  1:25 PM      Result Value  Ref Range Status   Specimen Description URINE, CLEAN CATCH   Final   Special Requests NONE   Final   Culture  Setup Time     Final   Value: 10/11/2013 20:09     Performed at [place]     Final   Value: 70,000 COLONIES/ML     Performed at [facility]   Culture     Final   Value: STAPHYLOCOCCUS SPECIES (COAGULASE NEGATIVE)     Note: RIFAMPIN AND GENTAMICIN SHOULD NOT BE USED AS SINGLE DRUGS FOR TREATMENT OF STAPH INFECTIONS.     Performed at [facility]   Report Status PENDING   Incomplete  CULTURE, BLOOD (ROUTINE X 2)     Status: None   Collection Time    10/11/13  7:33 PM      Result Value Ref Range Status   Specimen Description BLOOD RIGHT ARM   Final   Special Requests BOTTLES DRAWN AEROBIC AND ANAEROBIC 10CC   Final   Culture  Setup Time     Final   Value: 10/12/2013 04:12     Performed at [facility]   Culture     Final   Value:  BLOOD CULTURE RECEIVED NO GROWTH TO DATE CULTURE WILL BE HELD FOR 5 DAYS BEFORE ISSUING A FINAL NEGATIVE REPORT     Performed at [facility]   Report Status PENDING   Incomplete  CULTURE, BLOOD (ROUTINE X 2)     Status: None   Collection Time    10/11/13  7:43 PM      Result Value Ref Range Status   Specimen Description BLOOD LEFT ARM   Final   Special Requests BOTTLES DRAWN AEROBIC AND ANAEROBIC 5CC   Final   Culture  Setup Time     Final   Value: 10/12/2013 04:13     Performed at [facility]   Culture     Final   Value:        BLOOD CULTURE RECEIVED NO GROWTH TO DATE CULTURE WILL BE HELD FOR 5 DAYS BEFORE ISSUING A FINAL NEGATIVE REPORT     Performed at [facility]   Report Status PENDING   Incomplete    Anti-infectives   Start     Dose/Rate Route Frequency Ordered Stop   10/13/13 1200  vancomycin (VANCOCIN) 1,250 mg in sodium chloride 0.9 % 250 mL IVPB     1,250 mg 166.7 mL/hr over 90 Minutes Intravenous Every 24 hours 10/13/13 1046 10/18/13 1159    10/12/13 0000  vancomycin (VANCOCIN) IVPB 750 mg/150 ml premix  Status:  Discontinued     750 mg 150 mL/hr over 60 Minutes Intravenous Every 12 hours 10/11/13 1509 10/13/13 1046   10/12/13 0000  aztreonam (AZACTAM) 1 g in dextrose 5 % 50 mL IVPB     1 g 100 mL/hr over 30 Minutes Intravenous Every 8 hours 10/11/13 1523 10/18/13 2359   10/11/13 1430  vancomycin (VANCOCIN) IVPB 1000 mg/200 mL premix     1,000 mg 200 mL/hr over 60 Minutes Intravenous STAT 10/11/13 1415 10/11/13 1519   10/11/13 1415  aztreonam (AZACTAM) 2 g in dextrose 5 % 50 mL IVPB     2 g 100 mL/hr over 30 Minutes Intravenous  Once 10/11/13 1408 10/11/13 1828      Assessment: Admit Complaint: 80 y.o. female presents with SOB, cough. Noted recent adimt for afib/RVR (s/p TEE/DCCV), PNA - discharged on 8/15.   Anticoagulation: Eliquis PTA for afib 5mg  BID. Hgb 10.9 relatively stable.  Infectious Disease: HCAP. Afebrile. WBC 6.8.   Vanc 8/24>> Aztreonam 8/24 >>  8/24 BCx2: NGTD 8/24 UCx: CNS  Cardiovascular: HLD, diast HF, HLD, afib. Meds: amiiodarone, metoprolol, K+  Endocrinology: CBGs < 150  Nephrology: Scr 0.52  Pulmonary: 2L [place]  Hematology / Oncology: CBC stable  PTA Medication Issues: none  Best Practices: apixaban   Goal of Therapy:  Vancomycin trough level 15-20 mcg/ml  Plan:  Change Vanc to 750mg  IV q12h per dosing nomogram.  Trough due Saturday or Sunday Aztreo 1gm IV Q8H   Dejha King S. Alford Highland, PharmD, BCPS Clinical Staff Pharmacist Pager [phone]  Eilene Ghazi [place] 10/14/2013,11:00 AM

## 2013-10-14 NOTE — Consult Note (Signed)
Name: Julie Haas MRN: [id number] DOB: [birth year]    ADMISSION DATE:  10/11/2013 CONSULTATION DATE:  8/27  REFERRING MD :  Algis Liming  PRIMARY SERVICE:  [facility]   CHIEF COMPLAINT:  HCAP  BRIEF PATIENT DESCRIPTION:  80yo female with hx HTN, AFib, CHF, recent admit for PNA (d/c 8/15).  Returned 8/24 with worsening SOB, cough, chills.  CXR revealed multifocal PNA and pt admitted by [facility].  Course has been c/b AFib with RVR and acute on chronic dCHF.  PCCM consulted for assist with HCAP.   SIGNIFICANT EVENTS / STUDIES:  CT chest 8/24>>>patchy bilat asd with nodularity most prominent RUL, small bilat effusions.   LINES / TUBES: none  CULTURES: Urine 8/24>>> BCx2 8/24>>>  ANTIBIOTICS: Vanc >>> Aztreonam 8/24>>>  HISTORY OF PRESENT ILLNESS:  80yo female with hx HTN, AFib, CHF, recent admit for PNA (d/c 8/15).  Returned 8/24 with worsening SOB, cough, chills.  CXR revealed multifocal PNA and pt admitted by [facility].  Course has been c/b AFib with RVR and acute on chronic dCHF.  PCCM consulted for assist with HCAP.  Feeling better overall.    PAST MEDICAL HISTORY :  Past Medical History  Diagnosis Date  . Hyperlipidemia   . Arrhythmia     PVC  . Retinal detachment   . Renal disorder   . Allergy   . Occlusion and stenosis of carotid artery without mention of cerebral infarction 03/24/2012  . Pneumonia, organism unspecified 11/192013  . Depressive disorder, not elsewhere classified 03/05/2011  . Other premature beats 03/05/2011  . Other seborrheic keratosis 11/20/2010  . Syncope and collapse 10/02/2010  . Palpitations 11/21/2009  . Unspecified essential hypertension 06/27/2009  . Cardiomegaly 06/27/2009  . Other generalized ischemic cerebrovascular disease 06/27/2009  . Dizziness and giddiness 06/27/2009  . Unspecified vitamin D deficiency 05/16/2009  . Family history of sudden cardiac death (SCD) 12-07-2008  . Rash and other nonspecific skin eruption 07/05/2008  . Edema 03/15/2008  .  Obesity, unspecified 07/25/2006  . Varicose veins of lower extremities with inflammation 03/16/2003  . Shortness of breath    Past Surgical History  Procedure Laterality Date  . Cholecystectomy  1983  . Cataract extraction Bilateral     Dr. [name]  . Tonsillectomy    . Keratosis    . Retinal detachment surgery Left   . Cardioversion N/A 10/01/2013    Procedure: CARDIOVERSION;  Surgeon: Sanda Klein, MD;  Location: [facility] ENDOSCOPY;  Service: Cardiovascular;  Laterality: N/A;  . Tee without cardioversion N/A 10/01/2013    Procedure: TRANSESOPHAGEAL ECHOCARDIOGRAM (TEE);  Surgeon: Sanda Klein, MD;  [facility] END[facility]COPY;  Service: Cardiovascular;  Laterality: N/A;   Prior to Admission medications   Medication Sig Start Date End Date Taking? Authorizing Provider  apixaban (ELIQUIS) 5 MG TABS tablet Take 1 tablet (5 mg total) by mouth 2 (two) times daily. 10/02/13  Yes Tarri Fuller, PA-C  furosemide (LASIX) 40 MG tablet Take 1 tablet (40 mg total) by mouth daily. 10/02/13  Yes Tarri Fuller, PA-C  losartan (COZAAR) 50 MG tablet Take 50 mg by mouth daily.   Yes Historical Provider, MD  metoprolol (LOPRESSOR) 50 MG tablet Take 50 mg by mouth 2 (two) times daily.   Yes Historical Provider, MD  potassium chloride SA (K-DUR,KLOR-CON) 20 MEQ tablet Take 1 tablet (20 mEq total) by mouth daily. 10/02/13  Yes Tarri Fuller, PA-C  vitamin E 1000 UNIT capsule Take 1,000 Units by mouth daily.   Yes Historical Provider, MD  levofloxacin [facility])  750 MG tablet Take 1 tablet (750 mg total) by mouth daily. 10/02/13   Tarri Fuller, PA-C   Allergies  Allergen Reactions  . Codeine     Unknown  . Cucumber Extract Nausea And Vomiting  . Diuretic [Buchu-Cornsil[place]-Ch Grass-Hydran]     Unknown  . Lipitor [Atorvastatin Calcium]     Unknown  . Penicillins Other (See Comments)    unknown  . Pravachol     Unknown    FAMILY HISTORY:  Family History  Problem Relation Age of Onset  . Arrhythmia      Long QT in first  degree relatives,  . Heart attack Father   . Heart disease Father   . Heart attack Brother   . Heart disease Brother   . Pneumonia Mother    SOCIAL HISTORY:  reports that she has never smoked. She has never used smokeless tobacco. She reports that she does not drink alcohol or use illicit drugs.  REVIEW OF SYSTEMS:   As per HPI - All other systems reviewed and were neg.    SUBJECTIVE:   VITAL SIGNS: Temp:  [97.6 F (36.4 C)-98.3 F (36.8 C)] 97.7 F (36.5 C) (08/27 1448) Pulse Rate:  [112-146] 112 (08/27 1448) Resp:  [16-20] 19 (08/27 1448) BP: (91-114)/(53-61) 114/61 mmHg (08/27 1448) SpO2:  [91 %-97 %] 97 % (08/27 1448) Weight:  [197 lb 8 oz (89.585 kg)] 197 lb 8 oz (89.585 kg) (08/27 0559)  PHYSICAL EXAMINATION: General:  Chronically ill appearing female, NAD  Neuro:  Awake, alert, pleasantly confused  HEENT:  Mm moist, no JVD  Cardiovascular:  s1s2 irreg, tachy Lungs:  R>L scattered rhonchi, mild exp wheeze, comfortable on [place]  Abdomen:  Soft, +bs Musculoskeletal:  Warm and dry, no sig edema     Recent Labs Lab 10/11/13 1243 10/12/13 0404 10/13/13 0515  NA 133* 137 136*  K 4.3 3.5* 3.4*  CL 94* 95* 96  CO2 26 27 30   BUN 16 13 11   CREATININE 0.64 0.60 0.52  GLUCOSE 104* 92 88    Recent Labs Lab 10/11/13 1243 10/12/13 0404 10/13/13 0515  HGB 11.8* 11.1* 10.9*  HCT 36.2 33.1* 32.7*  WBC 7.8 6.7 6.8  PLT 337 281 293   No results found.  ASSESSMENT / PLAN:  HCAP  Dyspnea  PLAN -  -Cont abx as above - agree with vanc/aztreonam for now -F/u CXR  -pulm hygiene  -supplemental O2 -will need f/u CT chest 6-8 weeks -will send respiratory viral panel -may consider adding atypical coverage but will hold for now  AFib with RVR - s/p TEE and failed cardioversion last admit Acute on chronic dCHF Plan -  -Cards following  -Cont lopressor, eliquis  -Cont diuresis as tol  -Needs better HR control -ok for amiodarone per cards - no specific  contraindication   Nickolas Madrid, NP 10/14/2013  4:04 PM Pager: [phone] or [phone]  *Care during the described time interval was provided by me and/or other providers on the critical care team. I have reviewed this patient's available data, including medical history, events of note, physical examination and test results as part of my evaluation.  Patient seen and examined, agree with above note.  I dictated the care and orders written for this patient under my direction.  Rush Farmer, MD [phone]

## 2013-10-14 NOTE — Evaluation (Signed)
Physical Therapy Evaluation Patient Details Name: Julie Haas MRN: [id number] DOB: Oct 18, 1925 Today's Date: 10/14/2013   History of Present Illness  pt presents with HCAP and recent admit for A-fib with RVR.    Clinical Impression  Pt moving well, only limited by fatigue.  Pt's O2 sats remained 91-93% on 2L O2 during and after ambulation.  Pt indicates she is moving near baseline level.  Will continue to follow if remains on acute.      Follow Up Recommendations No PT follow up (Return to Independent Living)    Equipment Recommendations  None recommended by PT    Recommendations for Other Services       Precautions / Restrictions Precautions Precautions: Fall Restrictions Weight Bearing Restrictions: No      Mobility  Bed Mobility                  Transfers Overall transfer level: Needs assistance Equipment used: Rolling walker (2 wheeled) Transfers: Sit to/from Stand Sit to Stand: Supervision            Ambulation/Gait Ambulation/Gait assistance: Supervision Ambulation Distance (Feet): 180 Feet Assistive device: Rolling walker (2 wheeled) Gait Pattern/deviations: Step-through pattern;Decreased stride length;Trunk flexed   Gait velocity interpretation: Below normal speed for age/gender General Gait Details: pt moves slowly, but no deviations noted.  O2 sats on 2L O2 remained 91-93%.    Stairs            Wheelchair Mobility    Modified Rankin (Stroke Patients Only)       Balance Overall balance assessment: No apparent balance deficits (not formally assessed)                                           Pertinent Vitals/Pain Pain Assessment: No/denies pain    Home Living Family/patient expects to be discharged to:: Private residence                 Additional Comments: pt lives at [place]: Independent with assistive device(s)          Comments: Can obtain more assist from [facility] if needed.     Hand Dominance        Extremity/Trunk Assessment   Upper Extremity Assessment: Overall WFL for tasks assessed           Lower Extremity Assessment: Overall WFL for tasks assessed      Cervical / Trunk Assessment: Kyphotic  Communication   Communication: No difficulties  Cognition Arousal/Alertness: Awake/alert Behavior During Therapy: WFL for tasks assessed/performed Overall Cognitive Status: Within Functional Limits for tasks assessed                      General Comments      Exercises        Assessment/Plan    PT Assessment Patient needs continued PT services  PT Diagnosis Difficulty walking   PT Problem List Decreased activity tolerance;Decreased balance;Decreased mobility;Decreased knowledge of use of DME;Cardiopulmonary status limiting activity  PT Treatment Interventions DME instruction;Gait training;Functional mobility training;Therapeutic activities;Therapeutic exercise;Balance training;Patient/family education   PT Goals (Current goals can be found in the Care Plan section) Acute Rehab PT Goals Patient Stated Goal: to return home PT Goal Formulation: With patient Time For Goal Achievement: 10/21/13 Potential to Achieve Goals: Good  Frequency Min 3X/week   Barriers to discharge        Co-evaluation               End of Session Equipment Utilized During Treatment: Oxygen Activity Tolerance: Patient tolerated treatment well Patient left: in chair;with call bell/phone within reach Nurse Communication: Mobility status         Time: 1105-1140 PT Time Calculation (min): 35 min   Charges:   PT Evaluation $Initial PT Evaluation Tier I: 1 Procedure PT Treatments $Gait Training: 8-22 mins   PT G CodesCatarina Hartshorn, [place] 10/14/2013, 12:12 PM

## 2013-10-14 NOTE — Progress Notes (Signed)
Patient ID: Julie Haas, female   DOB: 1925-07-30, 80 y.o.   MRN: [id number]  [facility] PROGRESS NOTE  VELENCIA LENART BCW:[id number] DOB: 16-Jun-1925 DOA: 10/11/2013 PCP: Estill Dooms, MD  Brief narrative: 80 y.o. female with HTN, atrial fibrillation on [facility], recent admission for PNA s/p TEE/DCCV (discharged on 8/15) and presented to [facility] ED with main concern of progressively worsening shortness of breath, mostly exertional but occasionally present at rest. This as need associated with productive cough of yellow sputum, subjective fevers, chills, LE edema and 2 pillow orthopnea.   Assessment and Plan:      Acute hypoxic respiratory failure  - secondary to HCAP - pt clinically improving and denies dyspnea - continue Vanc and Aztreonam day #4/7. Consider transitioning to by mouth antibiotics in a.m. -  recommendation to have CT chest repeated in 6-8 weeks to re-evaluate the areas of nodularity     HCAP (healthcare-associated pneumonia) - ABX as noted above     HTN (hypertension) - Soft blood pressures limiting use of beta blockers.    Atrial fibrillation with RVR - continue Eliquis - Rate not adequately controlled. Soft blood pressures preclude increasing beta blockers. Cardiology adding amiodarone but are concerned regarding lung function and recommend pulmonary consultation- called 8/27 .    Acute on chronic diastolic heart failure - pt with mild crackles on exam - 3 L since admission - lasix per cards    Chronic left leg stasis ulcer  - continue wound care      Moderate malnutrition - advance diet as pt able to tolerate     Hypokalemia - mild, supplement and repeat BMP in AM    Anemia of chronic disease - Hg and Hct stable with no signs of active bleeding - stable  DVT prophylaxis  Eliquis for atrial fibrillation   Code Status: DNR Family Communication: None at bedside Disposition Plan: Home when medically stable  IV Access:   Peripheral  IV Procedures and diagnostic studies:   Ct Chest Wo Contrast   10/11/2013  Patchy bilateral airspace process with nodularity most prominent over the right upper lobe likely due to infection. Patchy bronchiectatic change as described. Small bilateral pleural effusions. 1.3 cm pretracheal lymph node likely reactive. Recommend followup CT in 6-8 weeks to re-evaluate the areas of nodularity.  Cardiomegaly with calcification of the mitral valve annulus and left atrial enlargement. Dg Chest Portable 1 View  10/11/2013   Cardiomegaly with chronic interstitial thickening. No overt congestive failure.  Multi focal pulmonary opacities. Primarily felt to represent areas of post infectious scarring. Cannot exclude progressive left lower lobe infection or aspiration.  Medical Consultants:   Cardiology  Other Consultants:   Physical therapy  Anti-Infectives:   Vancomycin 8/24 --> Aztreonam 8/24 -->    Alyx Gee, MD, FACP, FHM. [facility] Pager [phone]  If 7PM-7AM, please contact night-coverage [email or web address] Password [email or web address] 10/14/2013, 3:40 PM   LOS: 3 days   HPI/Subjective: States she feels much better today. Denies dyspnea. Denies palpitations, dizziness or lightheadedness.  Objective: Filed Vitals:   10/14/13 0000 10/14/13 0559 10/14/13 0800 10/14/13 1448  BP:  91/53  114/61  Pulse:  123 146 112  Temp:  98.3 F (36.8 C)  97.7 F (36.5 C)  TempSrc:  Oral  Oral  Resp: 16 18  19   Height:      Weight:  89.585 kg (197 lb 8 oz)    SpO2:  91%  97%    Intake/Output Summary (Last 24 hours) at 10/14/13  [place] filed at 10/14/13 1400  Gross per 24 hour  Intake    803 ml  Output   1450 ml  Net   -647 ml    Exam:   General:  Pt is alert, follows commands appropriately, not in acute distress. Seen ambulating in room with nurse this morning.  Cardiovascular: S1 and S2 heard, irregularly regular and mildly tachycardic. No JVD. Trace bilateral leg edema. Telemetry: A. fib  with ventricular rates ranging between 100-140s.  Respiratory: Few basal crackles but otherwise clear to auscultation. No increased work of breathing.  Abdomen: Soft, non tender, non distended, bowel sounds present, no guarding  CNS: Alert and oriented. No focal deficits.  Data Reviewed: Basic Metabolic Panel:  Recent Labs Lab 10/11/13 1243 10/12/13 0404 10/13/13 0515  NA 133* 137 136*  K 4.3 3.5* 3.4*  CL 94* 95* 96  CO2 26 27 30   GLUCOSE 104* 92 88  BUN 16 13 11   CREATININE 0.64 0.60 0.52  CALCIUM 8.4 7.9* 8.0*  MG  --  1.7  --    CBC:  Recent Labs Lab 10/11/13 1243 10/12/13 0404 10/13/13 0515  WBC 7.8 6.7 6.8  NEUTROABS 5.5  --   --   HGB 11.8* 11.1* 10.9*  HCT 36.2 33.1* 32.7*  MCV 92.8 93.2 92.6  PLT 337 281 293   Cardiac Enzymes:  Recent Labs Lab 10/11/13 1943 10/12/13 0126 10/12/13 0940  TROPONINI <0.30 <0.30 <0.30   Recent Results (from the past 240 hour(s))  URINE CULTURE     Status: None   Collection Time    10/11/13  1:25 PM      Result Value Ref Range Status   Specimen Description URINE, CLEAN CATCH   Final   Special Requests NONE   Final   Culture  Setup Time     Final   Value: 10/11/2013 20:09     Performed at [facility]     Final   Value: 70,000 COLONIES/ML     Performed at [facility]   Culture     Final   Value: STAPHYLOCOCCUS SPECIES (COAGULASE NEGATIVE)     Note: RIFAMPIN AND GENTAMICIN SHOULD NOT BE USED AS SINGLE DRUGS FOR TREATMENT OF STAPH INFECTIONS.     Performed at [facility]   Report Status PENDING   Incomplete  CULTURE, BLOOD (ROUTINE X 2)     Status: None   Collection Time    10/11/13  7:33 PM      Result Value Ref Range Status   Specimen Description BLOOD RIGHT ARM   Final   Special Requests BOTTLES DRAWN AEROBIC AND ANAEROBIC 10CC   Final   Culture  Setup Time     Final   Value: 10/12/2013 04:12     Performed at [facility]   Culture     Final   Value:         BLOOD CULTURE RECEIVED NO GROWTH TO DATE CULTURE WILL BE HELD FOR 5 DAYS BEFORE ISSUING A FINAL NEGATIVE REPORT     Performed at [facility]   Report Status PENDING   Incomplete  CULTURE, BLOOD (ROUTINE X 2)     Status: None   Collection Time    10/11/13  7:43 PM      Result Value Ref Range Status   Specimen Description BLOOD LEFT ARM   Final   Special Requests BOTTLES DRAWN AEROBIC AND ANAEROBIC 5CC   Final  Culture  Setup Time     Final   Value: 10/12/2013 04:13     Performed at [facility]   Culture     Final   Value:        BLOOD CULTURE RECEIVED NO GROWTH TO DATE CULTURE WILL BE HELD FOR 5 DAYS BEFORE ISSUING A FINAL NEGATIVE REPORT     Performed at [facility]   Report Status PENDING   Incomplete     Scheduled Meds: . apixaban  5 mg Oral BID  . aztreonam  1 g Intravenous Q8H  . furosemide  40 mg Intravenous BID  . hydrocerin   Topical Daily  . losartan  50 mg Oral Daily  . metoprolol  50 mg Oral BID  . potassium chloride SA  20 mEq Oral Daily  . vancomycin  750 mg Intravenous Q12H  . vitamin E  1,000 Units Oral Daily   Continuous Infusions:

## 2013-10-15 ENCOUNTER — Inpatient Hospital Stay (HOSPITAL_COMMUNITY): Payer: Medicare Other

## 2013-10-15 DIAGNOSIS — J209 Acute bronchitis, unspecified: Secondary | ICD-10-CM

## 2013-10-15 LAB — CBC
HCT: 38.3 % (ref 36.0–46.0)
Hemoglobin: 12.4 g/dL (ref 12.0–15.0)
MCH: 31.3 pg (ref 26.0–34.0)
MCHC: 32.4 g/dL (ref 30.0–36.0)
MCV: 96.7 fL (ref 78.0–100.0)
PLATELETS: 376 10*3/uL (ref 150–400)
RBC: 3.96 MIL/uL (ref 3.87–5.11)
RDW: 13.9 % (ref 11.5–15.5)
WBC: 9.6 10*3/uL (ref 4.0–10.5)

## 2013-10-15 LAB — BLOOD GAS, ARTERIAL
ACID-BASE EXCESS: 3.3 mmol/L — AB (ref 0.0–2.0)
Bicarbonate: 28.1 mEq/L — ABNORMAL HIGH (ref 20.0–24.0)
Drawn by: 405301
O2 Content: 4.5 L/min
O2 Saturation: 92.2 %
Patient temperature: 98.4
TCO2: 29.6 mmol/L (ref 0–100)
pCO2 arterial: 49 mmHg — ABNORMAL HIGH (ref 35.0–45.0)
pH, Arterial: 7.376 (ref 7.350–7.450)
pO2, Arterial: 66.3 mmHg — ABNORMAL LOW (ref 80.0–100.0)

## 2013-10-15 LAB — RESPIRATORY VIRUS PANEL
Adenovirus: NOT DETECTED
Influenza A H1: NOT DETECTED
Influenza A H3: NOT DETECTED
Influenza A: NOT DETECTED
Influenza B: NOT DETECTED
Metapneumovirus: NOT DETECTED
PARAINFLUENZA 2 A: NOT DETECTED
PARAINFLUENZA 3 A: NOT DETECTED
Parainfluenza 1: NOT DETECTED
RESPIRATORY SYNCYTIAL VIRUS B: NOT DETECTED
RHINOVIRUS: NOT DETECTED
Respiratory Syncytial Virus A: NOT DETECTED

## 2013-10-15 LAB — BASIC METABOLIC PANEL
ANION GAP: 13 (ref 5–15)
BUN: 12 mg/dL (ref 6–23)
CALCIUM: 8.3 mg/dL — AB (ref 8.4–10.5)
CO2: 27 mEq/L (ref 19–32)
Chloride: 95 mEq/L — ABNORMAL LOW (ref 96–112)
Creatinine, Ser: 0.57 mg/dL (ref 0.50–1.10)
GFR, EST NON AFRICAN AMERICAN: 82 mL/min — AB (ref >90)
Glucose, Bld: 153 mg/dL — ABNORMAL HIGH (ref 70–99)
Potassium: 4.5 mEq/L (ref 3.7–5.3)
Sodium: 135 mEq/L — ABNORMAL LOW (ref 137–147)

## 2013-10-15 LAB — URINE CULTURE

## 2013-10-15 LAB — SEDIMENTATION RATE: Sed Rate: 95 mm/hr — ABNORMAL HIGH (ref 0–22)

## 2013-10-15 LAB — PRO B NATRIURETIC PEPTIDE: Pro B Natriuretic peptide (BNP): 2137 pg/mL — ABNORMAL HIGH (ref 0–450)

## 2013-10-15 MED ORDER — FUROSEMIDE 10 MG/ML IJ SOLN
20.0000 mg | Freq: Once | INTRAMUSCULAR | Status: AC
  Administered 2013-10-15: 20 mg via INTRAVENOUS

## 2013-10-15 MED ORDER — MORPHINE SULFATE 4 MG/ML IJ SOLN
INTRAMUSCULAR | Status: AC
  Administered 2013-10-15: 4 mg
  Filled 2013-10-15: qty 1

## 2013-10-15 MED ORDER — METOPROLOL TARTRATE 1 MG/ML IV SOLN
INTRAVENOUS | Status: AC
  Administered 2013-10-15: 5 mg
  Filled 2013-10-15: qty 5

## 2013-10-15 MED ORDER — FUROSEMIDE 10 MG/ML IJ SOLN
INTRAMUSCULAR | Status: AC
  Administered 2013-10-15: 40 mg
  Filled 2013-10-15: qty 2

## 2013-10-15 MED ORDER — METHYLPREDNISOLONE SODIUM SUCC 40 MG IJ SOLR
40.0000 mg | Freq: Two times a day (BID) | INTRAMUSCULAR | Status: DC
Start: 2013-10-15 — End: 2013-10-19
  Administered 2013-10-15 – 2013-10-19 (×9): 40 mg via INTRAVENOUS
  Filled 2013-10-15 (×10): qty 1

## 2013-10-15 MED ORDER — DEXTROSE 5 % IV SOLN
500.0000 mg | INTRAVENOUS | Status: DC
  Administered 2013-10-15 – 2013-10-18 (×4): 500 mg via INTRAVENOUS
  Filled 2013-10-15 (×6): qty 500

## 2013-10-15 MED ORDER — METHYLPREDNISOLONE SODIUM SUCC 125 MG IJ SOLR
80.0000 mg | Freq: Once | INTRAMUSCULAR | Status: AC
  Administered 2013-10-15: 80 mg via INTRAVENOUS
  Filled 2013-10-15: qty 1.28

## 2013-10-15 MED ORDER — FUROSEMIDE 10 MG/ML IJ SOLN
INTRAMUSCULAR | Status: AC
  Administered 2013-10-15: 40 mg
  Filled 2013-10-15: qty 8

## 2013-10-15 NOTE — Progress Notes (Signed)
Patient: Julie Haas / Admit Date: 10/11/2013 / Date of Encounter: 10/15/2013, 8:48 AM   Subjective: Comfortable now, however had worsening SOB overnight requiring NRB, now on [place]. Breathing comfortably on [place]. No chest pain, palpitations.    Objective: Telemetry: A-fib, 120s mostly without a couple episodes of 160s Physical Exam: Blood pressure 105/85, pulse 116, temperature 97.7 F (36.5 C), temperature source Oral, resp. rate 22, height 5\' 2"  (1.575 m), weight 199 lb 8.3 oz (90.5 kg), SpO2 93.00%. General: Well developed, well nourished, in no acute distress. Head: Normocephalic, atraumatic, sclera non-icteric, no xanthomas, nares are without discharge. Neck: Negative for carotid bruits. JVP not elevated. Lungs: Mild bilateral rhonchi with crackles at the bases. On . Breathing is unlabored. Heart: Irregular S1 S2 without murmurs, rubs, or gallops.  Abdomen: Soft, non-tender, non-distended with normoactive bowel sounds. No rebound/guarding. Extremities: No clubbing or cyanosis. No edema. Distal pedal pulses are 2+ and equal bilaterally. Neuro: Alert and oriented X 3. Moves all extremities spontaneously. Psych:  Responds to questions appropriately with a normal affect.   Intake/Output Summary (Last 24 hours) at 10/15/13 0848 Last data filed at 10/15/13 9509  Gross per 24 hour  Intake    390 ml  Output   1100 ml  Net   -710 ml    Inpatient Medications:  . amiodarone  400 mg Oral TID  . apixaban  5 mg Oral BID  . azithromycin  500 mg Intravenous Q24H  . aztreonam  1 g Intravenous Q8H  . hydrocerin   Topical Daily  . metoprolol  25 mg Oral BID  . potassium chloride SA  20 mEq Oral Daily  . sodium chloride  3 mL Intravenous Q12H  . vancomycin  750 mg Intravenous Q12H  . vitamin E  1,000 Units Oral Daily   Infusions:    Labs:  Recent Labs  10/13/13 0515 10/15/13 0534  NA 136* 135*  K 3.4* 4.5  CL 96 95*  CO2 30 27  GLUCOSE 88 153*  BUN 11 12  CREATININE  0.52 0.57  CALCIUM 8.0* 8.3*   No results found for this basename: AST, ALT, ALKPHOS, BILITOT, PROT, ALBUMIN,  in the last 72 hours  Recent Labs  10/13/13 0515 10/15/13 0534  WBC 6.8 9.6  HGB 10.9* 12.4  HCT 32.7* 38.3  MCV 92.6 96.7  PLT 293 376    Recent Labs  10/12/13 0940  TROPONINI <0.30   No components found with this basename: POCBNP,  No results found for this basename: HGBA1C,  in the last 72 hours   Radiology/Studies:  Dg Chest 2 View  09/30/2013   EXAM: CHEST  2 VIEW  COMPARISON:  None.  FINDINGS: The heart size and mediastinal contours are within normal limits. Both lungs are clear. The visualized skeletal structures are unremarkable.  IMPRESSION: No active cardiopulmonary disease.   Electronically Signed   By: Marin Olp M.D.   On: 09/30/2013 08:04   Dg Chest 2 View  09/28/2013   CLINICAL DATA:  Cough  EXAM: CHEST  2 VIEW  COMPARISON:  09/27/2013  FINDINGS: Cardiac shadow is stable. Patchy infiltrates are again noted bilaterally. Increasing small left effusion is seen. No new focal abnormality is noted.  IMPRESSION: Patchy bilateral infiltrates.   Electronically Signed   By: Inez Catalina M.D.   On: 09/28/2013 11:09   Ct Chest Wo Contrast  10/11/2013   CLINICAL DATA:  Recurrent pneumonia.  EXAM: CT CHEST WITHOUT CONTRAST  TECHNIQUE: Multidetector CT  imaging of the chest was performed following the standard protocol without IV contrast.  COMPARISON:  CT 01/23/2009 and series if recent chest radiographs August 2015.  FINDINGS: Lungs are adequately inflated demonstrate a very small amount of bilateral pleural fluid. There is a patchy bilateral airspace process with some nodularity worse over the right upper lobe. Minimal patchy bronchiectatic change over the lingula, posterior right upper lobe and left apex.  Mild cardiomegaly with a very small amount of pericardial fluid. There is moderate calcific plaque involving the left main, left and descending and lateral circumflex  coronary arteries. Calcification over the mitral valve annulus with mild left atrial enlargement mild prominence of the main pulmonary arteries. There is a 1.3 cm pretracheal lymph node slightly larger. There is calcified plaque involving the thoracic aorta. Remaining mediastinal structures are unremarkable.  Images through the upper abdomen demonstrated 1.1 cm hypodensity over the left dome of the liver unchanged likely a cyst. Evidence of a prior cholecystectomy. Remainder of the exam is unchanged  IMPRESSION: Patchy bilateral airspace process with nodularity most prominent over the right upper lobe likely due to infection. Patchy bronchiectatic change as described. Small bilateral pleural effusions. 1.3 cm pretracheal lymph node likely reactive. Recommend followup CT in 6-8 weeks to re-evaluate the areas of nodularity.  Cardiomegaly with calcification of the mitral valve annulus and left atrial enlargement. Atherosclerotic coronary artery disease. Small amount of pericardial fluid.  1.1 cm liver cyst unchanged.   Electronically Signed   By: Marin Olp M.D.   On: 10/11/2013 17:34   Dg Chest Port 1 View  10/15/2013   CLINICAL DATA:  Increased shortness of breath and cough.  EXAM: PORTABLE CHEST - 1 VIEW  COMPARISON:  10/11/2013  FINDINGS: Cardiac enlargement. Diffuse interstitial and alveolar parenchymal infiltrates, increasing since previous study. Changes may represent diffuse pneumonia or edema. Nodular infiltrates again demonstrated in the right upper lung. Blunting of the right costophrenic angle suggesting a developing right pleural effusion.  IMPRESSION: Increasing diffuse airspace and interstitial infiltrates in the lungs. Developing small right pleural effusion.   Electronically Signed   By: Lucienne Capers M.D.   On: 10/15/2013 01:21   Dg Chest Portable 1 View  10/11/2013   CLINICAL DATA:  Atrial fibrillation and tachycardia.  EXAM: PORTABLE CHEST - 1 VIEW  COMPARISON:  Chest radiograph of  09/30/2013.  CT of 01/23/2009  FINDINGS: Midline trachea. Moderate cardiomegaly. Chronic right-sided pleural thickening blunts the costophrenic angle. Possible small left pleural effusion. No pneumothorax. Interstitial thickening is chronic. Patchy right upper lobe peripheral opacity has been present back to 01/10/2012. There is also chronic right base pulmonary opacity. Left lower lobe pulmonary opacity may be progressive since 09/30/2013. Grossly similar to 09/28/2013.  IMPRESSION: Cardiomegaly with chronic interstitial thickening. No overt congestive failure.  Multi focal pulmonary opacities. Primarily felt to represent areas of post infectious scarring. Cannot exclude progressive left lower lobe infection or aspiration.  Given chronicity, CT may be informative to re-evaluate the pulmonary opacities.   Electronically Signed   By: Abigail Miyamoto M.D.   On: 10/11/2013 13:33   Dg Chest Port 1 View  09/27/2013   CLINICAL DATA:  Shortness of breath.  EXAM: PORTABLE CHEST - 1 VIEW  COMPARISON:  Chest radiograph performed 09/26/2013  FINDINGS: The lungs are well-aerated. Vascular congestion is noted, with diffuse bilateral airspace opacities, possibly reflecting multifocal pneumonia or pulmonary edema. Small bilateral pleural effusions are seen. No pneumothorax is identified.  The cardiomediastinal silhouette is mildly enlarged. No acute osseous  abnormalities are seen.  IMPRESSION: Vascular congestion and mild cardiomegaly, with diffuse bilateral airspace opacities, possibly reflecting multifocal pneumonia or pulmonary edema. Small bilateral pleural effusions seen.   Electronically Signed   By: Garald Balding M.D.   On: 09/27/2013 04:55   Dg Chest Port 1 View  09/26/2013   CLINICAL DATA:  Dyspnea.  Hypoxia.  EXAM: PORTABLE CHEST - 1 VIEW  COMPARISON:  Chest x-ray 09/24/2013.  FINDINGS: Patchy areas of interstitial prominence and multifocal airspace disease are seen scattered throughout the lungs bilaterally (right  greater than left). Small bilateral pleural effusions. There does not appear to be cephalization of the pulmonary vasculature. Heart size appears borderline enlarged, accentuated by low lung volumes and lordotic positioning on this portable radiograph. Upper mediastinal contours are within normal limits allowing for these technique related limitations. Atherosclerosis in the thoracic aorta.  IMPRESSION: 1. Multifocal patchy asymmetrically distributed interstitial and airspace disease throughout the lungs bilaterally (right greater than left), concerning for multilobar pneumonia. 2. Small bilateral pleural effusions. 3. Borderline cardiomegaly. 4. Atherosclerosis.   Electronically Signed   By: Vinnie Langton M.D.   On: 09/26/2013 17:06   Dg Chest Port 1 View  09/24/2013   CLINICAL DATA:  Shortness of breath.  EXAM: PORTABLE CHEST - 1 VIEW  COMPARISON:  01/10/2012.  FINDINGS: There is chronic cardiomegaly. Aortic atherosclerosis. No acute aortic contour abnormality.  There is diffuse interstitial coarsening which is increased from previous. Chronic patchy opacities in the bilateral lungs, most confluent in the right upper and right lower chest. Based on chest CT 01/23/2009, these are likely postinfectious scarring. No definitive effusion. No pneumothorax.  IMPRESSION: When accounting for chronic lung scarring (likely postinfectious), no definite edema or pneumonia.   Electronically Signed   By: Jorje Guild M.D.   On: 09/24/2013 02:11     Assessment and Plan  80 y.o. female with h/o a-fib (diagnosed last admission 8/7-8/15) s/p TEE/DCCV (successful) on [facility], HTN, PVCs, who presented to [facility] on 10/11/13 with worsening SOB, DOE, mild productive cough, and chills. CXR with with multifocal PNA. Found to be in a-fib with RVR s/p DCCV 8/14. Now with acute respiratory failure 2/2 HCAP/A on C diastolic CHF   1. Acute respiratory failure:  -2/2 HCAP/acute on chronic diastolic CHF -On NRB overnight now on [place], CXR  worse -Azithromycin has been added for atypicals  2. A-fib with RVR: -On Eliquis 5 mg bid -Seen by pulmonary 8/27- ok to continue amiodarone -Rate 120s mostly-couple episodes of 160s on telemetry  -On Lopressor 25 bid and amiodarone 400 mg tid -K+ 4.5 -Expect a-fib to persist until underlying infection improves  3. Acute on chronic diastolic CHF      -Lasix stopped 8/27 secondary to hypotension, BP has responded well -Exam reveals mild bilateral rhonchi, with crackles at the bases -BNP stable at 2137, previous 2071 on 8/27 -Consider restarting low dose Lasix  4. HCAP -Continue abx   Signed, Christell Faith, PA-C 10/15/2013 9:19 AM  History and all data above reviewed.  Patient examined.  I agree with the findings as above.  Still SOB. The patient exam reveals NWG:[id number]  ,  Lungs: Decreased breath sounds  ,  Abd: Positive bowel sounds, no rebound no guarding, Ext Trace edema  .  All available labs, radiology testing, previous records reviewed. Agree with documented assessment and plan. I spoke with Dr. Titus Mould.  I agree that the atrial fib with rapid rate is secondary to the lugn disease although when it goes very fast  she has pulmonary edema.  Now that pulmonary is involved and she is on steroids I hope that her pulmonary process will improve.  I will then stop the amio pending a trial of this pulmonary treatment.  Discussed with the patient and her family.    Jeneen Rinks Kyston Gonce  12:35 PM  10/15/2013

## 2013-10-15 NOTE — Progress Notes (Addendum)
Pt called for increasing SOB again.  Rn to room, pt with increased RR, labored breathing, increased BP, o2 80% on venti mask, lungs wheezy bilaterally.  Rapid response called back, NP on call paged again. NRB placed on pt.  Rapid RN and NP to bedside to assess.  5mg  Metoprolol, 100mg  IV lasik, and 4mg  morphine sulfate administered per NP order.  FC placed per order.  Pt Hr down to 100s-120s, rr 25-30, 02 96% on NRB. Will continue to monitor very closely. Pt refused for RN to contact family at time.

## 2013-10-15 NOTE — Consult Note (Signed)
Wound care re-consult: Re-consult requested for left leg.  Refer to [facility] consult performed on 8/25.  Left leg remains without open wound or drainage and appearance has improved from previous assessment since dry scabbed areas have resolved.  Continue present plan of care with Eucerin.  No other topical treatment is indicated at this time. Please re-consult if further assistance is needed.  Thank-you,  Julien Girt MSN, [place], [place], [place], [place]

## 2013-10-15 NOTE — Progress Notes (Addendum)
Shift event: Brief hx: This 80 yo WF was readmitted to hospital on 10/11/13 having just been discharged on the 15th for Afib with RVR and PNA. Readmitted with same plus CHF. She has been followed by hospitalists, cardio, and pulmonology this admission. Because of hypotension, her Lasix was stopped yesterday. Earlier in the shift, pt had an episode of SOB and was given a neb tx and solumedrol and felt better. CXR at that time showed worsening PNA and possible pleural effusion, no overt pulmonary edema. BP normal at that time. ABG with low PO2 and venti mask at 35% was placed on pt and pt rested after that. Rapid response saw pt. Later, at 2nd episode, NP went to bedside for worsening respiratory distress.  S: Pt states she is having a lot of trouble breathing. No pain anywhere. Very SOB. Coughing a lot. SOB worse with lying back. Worse than earlier tonight. Neb didn't really help. O: Chronically ill, acutely ill appearing elderly WF in moderate to severe respiratory distress. BP 190-200/100 upon my arrival. RR in the 30s with increased WOB and use of accessory muscles. No cyanosis noted. Lungs with wheezing, crackles and decreased air exchange throughout. Card: irreg irreg with rate in the 130-140 range with Afib on tele. 2+ pitting edema to dorsal feet and lower tibial area.  A/P: 1. Flash pulmonary edema/CHF decompensation/worsening PNA-Pt on NRB 100%. Lasix 80mg  IV x 1, Metoprolol 5mg  IV x 1, MSO4 4mg  IV x 1 given with close attention to BP. BP down to 120s. HR down to 1 teens, still in Afib (but has been). Foley placed for aggressive diuresis. After this treatment, BP down, HR down, and breathing much easier with rate decreased and WOB decreased. At this time, will continue NRB and watch pt. Hope is to stave off use of Bipap if possible in this 80 yo lady. She has agreed to Bipap but does NOT want intubation or CPR.  For PNA will continue present abx and supportive treatment and let pulmonary direct further  care this am. May want to start atypical coverage.  2. Afib with RVR-Afib is chronic, rate was controlled. Think RVR from sickness, acute pulmonary edema tonight. Better after Metoprolol. Will follow. Cardio seeing as well.  Get BMP, pro-BNP this am.  Clance Boll, NP [facility] Update: Called RN to check on pt. RN states pt is doing much better. Actually slept some. WOB much improved. Satting fine, still on NRB which this NP agrees with leaving on for now. BP down and stable. HR in one teens. Report given to oncoming attending about overnight events and that he may want to see her first this am. Baltazar Najjar

## 2013-10-15 NOTE — Evaluation (Signed)
Clinical/Bedside Swallow Evaluation Patient Details  Name: KENSIE SUSMAN MRN: [id number] Date of Birth: 10/06/1925  Today's Date: 10/15/2013 Time: 1450-1520 SLP Time Calculation (min): 30 min  Past Medical History:  Past Medical History  Diagnosis Date  . Hyperlipidemia   . Arrhythmia     PVC  . Retinal detachment   . Renal disorder   . Allergy   . Occlusion and stenosis of carotid artery without mention of cerebral infarction 03/24/2012  . Pneumonia, organism unspecified 11/192013  . Depressive disorder, not elsewhere classified 03/05/2011  . Other premature beats 03/05/2011  . Other seborrheic keratosis 11/20/2010  . Syncope and collapse 10/02/2010  . Palpitations 11/21/2009  . Unspecified essential hypertension 06/27/2009  . Cardiomegaly 06/27/2009  . Other generalized ischemic cerebrovascular disease 06/27/2009  . Dizziness and giddiness 06/27/2009  . Unspecified vitamin D deficiency 05/16/2009  . Family history of sudden cardiac death (SCD) 12-17-08  . Rash and other nonspecific skin eruption 07/05/2008  . Edema 03/15/2008  . Obesity, unspecified 07/25/2006  . Varicose veins of lower extremities with inflammation 03/16/2003  . Shortness of breath    Past Surgical History:  Past Surgical History  Procedure Laterality Date  . Cholecystectomy  1983  . Cataract extraction Bilateral     Dr. [name]  . Tonsillectomy    . Keratosis    . Retinal detachment surgery Left   . Cardioversion N/A 10/01/2013    Procedure: CARDIOVERSION;  Surgeon: Sanda Klein, MD;  Location: [facility] ENDOSCOPY;  Service: Cardiovascular;  Laterality: N/A;  . Tee without cardioversion N/A 10/01/2013    Procedure: TRANSESOPHAGEAL ECHOCARDIOGRAM (TEE);  Surgeon: Sanda Klein, MD;  Location: [facility] ENDOSCOPY;  Service: Cardiovascular;  Laterality: N/A;   HPI:  80yo female with dx of acute respiratory failure in the setting of bilateral R>L PNA.  Hx HTN, AFib, CHF, recent admit for PNA (d/c 8/15). Returned 8/24 with  worsening SOB, cough, chills. CXR revealed multifocal PNA and pt admitted by [facility]. Course has been c/b AFib with RVR and acute on chronic dCHF. Respiratory status worse overnight requiring NRB; now back on [place].  Swallow eval ordered to r/o aspiration.     Assessment / Plan / Recommendation Clinical Impression  Pt with hx recurrent pna presents with subtle signs of potential aspiration with consumption of thin liquids, including intermittent cough and respiratory pattern that may inhibit safe swallow sequence.  In light of recent history, recommend proceeding with MBS for further assessment of swallowing function.  Discussed with pt, MD.  Will schedule MBS for next date; pt may continue current diet pending assessment.     Aspiration Risk  Mild    Diet Recommendation Regular;Thin liquid   Liquid Administration via: Cup Medication Administration: Whole meds with liquid Supervision: Patient able to self feed    Other  Recommendations Recommended Consults: MBS Oral Care Recommendations: Oral care BID   Follow Up Recommendations   (tba)        Swallow Study Prior Functional Status       General Date of Onset: 10/11/13 HPI: 80yo female with dx of acute respiratory failure in the setting of bilateral R>L PNA.  Hx HTN, AFib, CHF, recent admit for PNA (d/c 8/15). Returned 8/24 with worsening SOB, cough, chills. CXR revealed multifocal PNA and pt admitted by [facility]. Course has been c/b AFib with RVR and acute on chronic dCHF. Respiratory status worse overnight requiring NRB; now back on .  Swallow eval ordered to r/o aspiration.   Type of Study: Bedside swallow  evaluation Previous Swallow Assessment: none Diet Prior to this Study: Regular;Thin liquids (sodium restricted) Temperature Spikes Noted: No Respiratory Status: Nasal cannula History of Recent Intubation: No Behavior/Cognition: Alert;Cooperative;Pleasant mood Oral Cavity - Dentition: Adequate natural dentition Self-Feeding  Abilities: Able to feed self Patient Positioning: Upright in bed Baseline Vocal Quality: Breathy;Hoarse Volitional Cough: Strong Volitional Swallow: Able to elicit    Oral/Motor/Sensory Function Overall Oral Motor/Sensory Function: Appears within functional limits for tasks assessed   Ice Chips Ice chips: Not tested   Thin Liquid Thin Liquid: Impaired Presentation: Cup Pharyngeal  Phase Impairments: Multiple swallows;Cough - Delayed    Nectar Thick Nectar Thick Liquid: Not tested   Honey Thick Honey Thick Liquid: Not tested   Puree Puree: Within functional limits   Solid  Pershing Skidmore L. [place], [place] CCC/SLP Pager [phone]     Solid: Within functional limits       [name] 10/15/2013,4:19 PM

## 2013-10-15 NOTE — Progress Notes (Signed)
Patient ID: BETHENE HANKINSON, female   DOB: 1925-10-03, 80 y.o.   MRN: [id number]  [facility] PROGRESS NOTE  DECIE VERNE HDQ:[id number] DOB: Dec 26, 1925 DOA: 10/11/2013 PCP: Estill Dooms, MD  Chart reviewed. Events of the night noted.  Brief narrative: 80 y.o. female with HTN, atrial fibrillation on AC, recent admission for PNA s/p TEE/DCCV (discharged on 8/15) and presented to [facility] ED with main concern of progressively worsening shortness of breath, mostly exertional but occasionally present at rest. This as need associated with productive cough of yellow sputum, subjective fevers, chills, LE edema and 2 pillow orthopnea.   Assessment and Plan:      Acute hypoxic respiratory failure  - secondary to HCAP/CHF Worse overnight and now on NRB. CXR worse. Will get speech eval to r/o recurrent aspiration, add azithromycin for atypical coverage, check sputum cx and urine legionella and strep pneumonia antigens. -  recommendation to have CT chest repeated in 6-8 weeks to re-evaluate the areas of nodularity     HCAP (healthcare-associated pneumonia) - ABX as noted above     HTN (hypertension) controlled    Atrial fibrillation with RVR - continue Eliquis Rate still high at around 120.  Per pulmonary, ok to use amiodarone    Acute on chronic diastolic heart failure - pt with mild crackles on exam - lasix per cards    Chronic left leg stasis ulcer  - continue wound care      Moderate malnutrition - advance diet as pt able to tolerate     Hypokalemia repleted    Anemia of chronic disease - Hg and Hct stable with no signs of active bleeding - stable  Code Status: DNR Family Communication: daughter Disposition Plan: not stable for discharge  IV Access:   Peripheral IV Procedures and diagnostic studies:   Ct Chest Wo Contrast   10/11/2013  Patchy bilateral airspace process with nodularity most prominent over the right upper lobe likely due to infection. Patchy  bronchiectatic change as described. Small bilateral pleural effusions. 1.3 cm pretracheal lymph node likely reactive. Recommend followup CT in 6-8 weeks to re-evaluate the areas of nodularity.  Cardiomegaly with calcification of the mitral valve annulus and left atrial enlargement. Dg Chest Portable 1 View  10/11/2013   Cardiomegaly with chronic interstitial thickening. No overt congestive failure.  Multi focal pulmonary opacities. Primarily felt to represent areas of post infectious scarring. Cannot exclude progressive left lower lobe infection or aspiration.  Medical Consultants:   Cardiology  Other Consultants:   Physical therapy  Anti-Infectives:   Vancomycin 8/24 --> Aztreonam 8/24 --> azithro 8/28 -->   LOS: 4 days   HPI/Subjective: Had a "rough night. Thought I was going to die" "ready to go" when it's "her time", but wants to continue care. Confirms DNR.  Feels slightly better, but still dyspneic.   Objective: Filed Vitals:   10/15/13 0352 10/15/13 0425 10/15/13 0500 10/15/13 0649  BP: 125/82 120/77  105/85  Pulse: 97 122  116  Temp:    97.7 F (36.5 C)  TempSrc:    Oral  Resp:    22  Height:      Weight:   90.5 kg (199 lb 8.3 oz)   SpO2: 93%   93%    Intake/Output Summary (Last 24 hours) at 10/15/13 0816 Last data filed at 10/15/13 0727  Gross per 24 hour  Intake    390 ml  Output   1100 ml  Net   -710 ml  Exam:   General:  Talkative, but dyspneic. On NRB.  Oriented and appropriate.  Cardiovascular: irreg irreg. fast  Respiratory: tachypneic. Rales on left greater than right  Abdomen: Soft, non tender, non distended, bowel sounds present, no guarding  Ext: warm. No CCE  Data Reviewed: Basic Metabolic Panel:  Recent Labs Lab 10/11/13 1243 10/12/13 0404 10/13/13 0515 10/15/13 0534  NA 133* 137 136* 135*  K 4.3 3.5* 3.4* 4.5  CL 94* 95* 96 95*  CO2 26 27 30 27   GLUCOSE 104* 92 88 153*  BUN 16 13 11 12   CREATININE 0.64 0.60 0.52 0.57  CALCIUM  8.4 7.9* 8.0* 8.3*  MG  --  1.7  --   --    CBC:  Recent Labs Lab 10/11/13 1243 10/12/13 0404 10/13/13 0515 10/15/13 0534  WBC 7.8 6.7 6.8 9.6  NEUTROABS 5.5  --   --   --   HGB 11.8* 11.1* 10.9* 12.4  HCT 36.2 33.1* 32.7* 38.3  MCV 92.8 93.2 92.6 96.7  PLT 337 281 293 376   Cardiac Enzymes:  Recent Labs Lab 10/11/13 1943 10/12/13 0126 10/12/13 0940  TROPONINI <0.30 <0.30 <0.30   Recent Results (from the past 240 hour(s))  URINE CULTURE     Status: None   Collection Time    10/11/13  1:25 PM      Result Value Ref Range Status   Specimen Description URINE, CLEAN CATCH   Final   Special Requests NONE   Final   Culture  Setup Time     Final   Value: 10/11/2013 20:09     Performed at [place]     Final   Value: 70,000 COLONIES/ML     Performed at [facility]   Culture     Final   Value: STAPHYLOCOCCUS SPECIES (COAGULASE NEGATIVE)     Note: RIFAMPIN AND GENTAMICIN SHOULD NOT BE USED AS SINGLE DRUGS FOR TREATMENT OF STAPH INFECTIONS.     Performed at [facility]   Report Status 10/15/2013 FINAL   Final   Organism ID, Bacteria STAPHYLOCOCCUS SPECIES (COAGULASE NEGATIVE)   Final  CULTURE, BLOOD (ROUTINE X 2)     Status: None   Collection Time    10/11/13  7:33 PM      Result Value Ref Range Status   Specimen Description BLOOD RIGHT ARM   Final   Special Requests BOTTLES DRAWN AEROBIC AND ANAEROBIC 10CC   Final   Culture  Setup Time     Final   Value: 10/12/2013 04:12     Performed at [facility]   Culture     Final   Value:        BLOOD CULTURE RECEIVED NO GROWTH TO DATE CULTURE WILL BE HELD FOR 5 DAYS BEFORE ISSUING A FINAL NEGATIVE REPORT     Performed at [facility]   Report Status PENDING   Incomplete  CULTURE, BLOOD (ROUTINE X 2)     Status: None   Collection Time    10/11/13  7:43 PM      Result Value Ref Range Status   Specimen Description BLOOD LEFT ARM   Final   Special Requests BOTTLES  DRAWN AEROBIC AND ANAEROBIC 5CC   Final   Culture  Setup Time     Final   Value: 10/12/2013 04:13     Performed at [facility]   Culture     Final   Value:  BLOOD CULTURE RECEIVED NO GROWTH TO DATE CULTURE WILL BE HELD FOR 5 DAYS BEFORE ISSUING A FINAL NEGATIVE REPORT     Performed at [facility]   Report Status PENDING   Incomplete     Scheduled Meds: . apixaban  5 mg Oral BID  . aztreonam  1 g Intravenous Q8H  . furosemide  40 mg Intravenous BID  . hydrocerin   Topical Daily  . losartan  50 mg Oral Daily  . metoprolol  50 mg Oral BID  . potassium chloride SA  20 mEq Oral Daily  . vancomycin  750 mg Intravenous Q12H  . vitamin E  1,000 Units Oral Daily   Continuous Infusions:    Delfina Redwood, MD [facility] Pager [phone]  If 7PM-7AM, please contact night-coverage [email or web address] Password [email or web address] 10/15/2013, 8:16 AM

## 2013-10-15 NOTE — Progress Notes (Signed)
Called to room per floor RN for pt increased WOB and elevated HR. While en route via telephone RN informed me that pt spo2 80s%, advised to place pt on NRB and notify [facility] NP. Upon my arrival pt found in distress, lying in bed spo2 92-95 %on NRB, RR 30s, BP elevated, and hr 130-140s. Pt lung sounds diminished with wheezes auscultated in upper posterior lobes. [facility] Forrest Moron NP to bedside [phone]. Morphine 4mg , Lasix 80mg , and lopressor 5mg  IV given st 0345 per order. FC placed. As of 0400 pt rr 25-30, dyspnea mildly improved. Spoke with pt about trying Bipap mask if needed, per pt "I will try it." Pt very clear on her wishes to not use ventilator as life support. RN advised to  monitor pt closely, and notify myself and provider for worsening condition.

## 2013-10-15 NOTE — Progress Notes (Signed)
Pt called RN to room, saying she was "miserable" and couldn't breathe.  O2 @ 2L 87-88%, labored breathing, RR 30s, wheezing bilaterally on auscultation. O2 increased to 5L to maintain 92-93% BP 109/94 HR 110s. MD on call notified.  New orders received, Stat Cxray and ABG ordered.  RR RN to assess as well, agreeing with new orders.  Marland Kitchen  0149 Solumedrol administered.  ABG resulted,  Verbal orders from MD on call to place pt on Venti mask at 35% for the rest of the night due to low o2 level.    0209 Pt now resting in bed.  Will continue to monitor closely.

## 2013-10-15 NOTE — Clinical Social Work Note (Signed)
Clinical Social Worker continuing to follow patient for support and discharge planning needs. CSW with facility admissions coordinator to update on patient current discharge plans. Patient continues to have a bed available at [facility] for when medically ready. CSW remains available for support and to facilitate patient discharge needs once medically stable.   Barbette Or, [place]

## 2013-10-15 NOTE — Progress Notes (Signed)
Last discharged 8/15 (lives and takes meds independently at [facility]) but was discharged to a more acute care unit called [facility] before being returned to her regular room.  1. Has her own system for taking meds PTA  2. Upon last discharge from [facility], 4 prescriptions were called in to [facility] and then delivered to her home: Eliquis, Lasix, K, and Levaquin. She did not know what they were so she did not open bag.  3. When she was discharged from the acute care section of [facility] and sent back her regular room, she was given punch cards with a month's worth of medications. She is not experienced with these punch cards and did not know how they figured in to her other medications already at home.  Therefore pt has no idea what she is supposed to be taking based on what she has at home, what is coming from [facility], and those punch cards that get sent home from [place]. My understanding in progression rounds today 8/28 is that she might go BACK to [place] so she will likely get discharged from there with more punch cards with medications on them.    Julie Haas, PharmD, [place] Clinical Staff Pharmacist Pager [phone]

## 2013-10-15 NOTE — Progress Notes (Signed)
Name: Julie Haas MRN: [id number] DOB: 1925/11/07    ADMISSION DATE:  10/11/2013 CONSULTATION DATE:  8/27  REFERRING MD :  Algis Liming  PRIMARY SERVICE:  [facility]   CHIEF COMPLAINT:  HCAP  BRIEF PATIENT DESCRIPTION:  80yo female with hx HTN, AFib, CHF, recent admit for PNA (d/c 8/15).  Returned 8/24 with worsening SOB, cough, chills.  CXR revealed multifocal PNA and pt admitted by [facility].  Course has been c/b AFib with RVR and acute on chronic dCHF.  PCCM consulted for assist with HCAP.   SIGNIFICANT EVENTS / STUDIES:  CT chest 8/24>>>patchy bilat asd with nodularity most prominent RUL, small bilat effusions.   LINES / TUBES: none  CULTURES: Urine 8/24>>> BCx2 8/24>>> resp viral culture 8/27>>> u strep 8/28>>> u legionella 8/28>>>  ANTIBIOTICS: Vanc >>> Aztreonam 8/24>>> azithro 8/28>>>  EVENTS 8/28. Required increased O2. Steroid trial initiated. DNR re-validated.    SUBJECTIVE:  Had a rough night, feels a little better now  VITAL SIGNS: Temp:  [97.7 F (36.5 C)-98.6 F (37 C)] 97.7 F (36.5 C) (08/28 0649) Pulse Rate:  [73-147] 116 (08/28 0649) Resp:  [18-24] 22 (08/28 0649) BP: (105-204)/(61-145) 105/85 mmHg (08/28 0649) SpO2:  [82 %-97 %] 93 % (08/28 0649) Weight:  [90.5 kg (199 lb 8.3 oz)] 90.5 kg (199 lb 8.3 oz) (08/28 0500) 4 liters   Intake/Output Summary (Last 24 hours) at 10/15/13 0902 Last data filed at 10/15/13 4098  Gross per 24 hour  Intake    390 ml  Output   1100 ml  Net   -710 ml   PHYSICAL EXAMINATION: General:  Chronically ill appearing female, NAD  Neuro:  Awake, alert, pleasantly confused  HEENT:  Mm moist, no JVD  Cardiovascular:  s1s2 irreg, tachy Lungs:  R>L scattered rhonchi, and diffuse rales  Abdomen:  Soft, +bs Musculoskeletal:  Warm and dry, no sig edema     Recent Labs Lab 10/12/13 0404 10/13/13 0515 10/15/13 0534  NA 137 136* 135*  K 3.5* 3.4* 4.5  CL 95* 96 95*  CO2 27 30 27   BUN 13 11 12   CREATININE 0.60  0.52 0.57  GLUCOSE 92 88 153*    Recent Labs Lab 10/12/13 0404 10/13/13 0515 10/15/13 0534  HGB 11.1* 10.9* 12.4  HCT 33.1* 32.7* 38.3  WBC 6.7 6.8 9.6  PLT 281 293 376   Dg Chest Port 1 View  10/15/2013   CLINICAL DATA:  Increased shortness of breath and cough.  EXAM: PORTABLE CHEST - 1 VIEW  COMPARISON:  10/11/2013  FINDINGS: Cardiac enlargement. Diffuse interstitial and alveolar parenchymal infiltrates, increasing since previous study. Changes may represent diffuse pneumonia or edema. Nodular infiltrates again demonstrated in the right upper lung. Blunting of the right costophrenic angle suggesting a developing right pleural effusion.  IMPRESSION: Increasing diffuse airspace and interstitial infiltrates in the lungs. Developing small right pleural effusion.   Electronically Signed   By: Lucienne Capers M.D.   On: 10/15/2013 01:21  aeration worse. Prob evolving R effusion.   ASSESSMENT / PLAN: Acute respiratory failure in the setting of bilateral R>L pulmonary infiltrates. D dx include HCAP w/ ARDS, viral pneumonitis, pulmonary edema, cryptogenic organizing PNA.   PLAN -  -Cont abx as above - agree with vanc/aztreonam, primary added azithro, this is reasonable  -diuresis as BP will allow  -pulm hygiene  -supplemental O2 -f/u cxr 8/30 -will need f/u CT chest 6-8 weeks -will f/u respiratory viral panel - send ESR, got one dose of steroids  last night, have spoken w/ pt. Not a candidate for FOB or OLBx. Will go ahead and initiate steroid trial 8/28, follow for agitation / enceph -low threshold to dc amio -repeat pcxr -may need to evaluate c3 c4, further autoimmune panels, anca, ana - follow for steroids empiric response prior   AFib with RVR - s/p TEE and failed cardioversion last admit Acute on chronic dCHF Plan -  -Cards following  -Cont lopressor, eliquis  -Cont diuresis as tol  -Needs better HR control -getting amiodarone, we need to be mindful of this given her High FIO2  requirements.   Will follow in am for steroids response  Lavon Paganini. Titus Mould, MD, [place] Pgr: [facility]

## 2013-10-16 ENCOUNTER — Inpatient Hospital Stay (HOSPITAL_COMMUNITY): Payer: Medicare Other

## 2013-10-16 DIAGNOSIS — I8310 Varicose veins of unspecified lower extremity with inflammation: Secondary | ICD-10-CM

## 2013-10-16 DIAGNOSIS — E669 Obesity, unspecified: Secondary | ICD-10-CM

## 2013-10-16 DIAGNOSIS — IMO0002 Reserved for concepts with insufficient information to code with codable children: Secondary | ICD-10-CM

## 2013-10-16 DIAGNOSIS — I6789 Other cerebrovascular disease: Secondary | ICD-10-CM

## 2013-10-16 DIAGNOSIS — R1310 Dysphagia, unspecified: Secondary | ICD-10-CM

## 2013-10-16 DIAGNOSIS — E785 Hyperlipidemia, unspecified: Secondary | ICD-10-CM

## 2013-10-16 MED ORDER — METOPROLOL TARTRATE 25 MG PO TABS
37.5000 mg | ORAL_TABLET | Freq: Two times a day (BID) | ORAL | Status: DC
  Administered 2013-10-16 – 2013-10-20 (×8): 37.5 mg via ORAL
  Filled 2013-10-16 (×9): qty 1

## 2013-10-16 MED ORDER — MAGNESIUM HYDROXIDE 400 MG/5ML PO SUSP: 30.0000 mL | Freq: Every day | ORAL | Status: DC | PRN

## 2013-10-16 MED ORDER — FUROSEMIDE 20 MG PO TABS
20.0000 mg | ORAL_TABLET | Freq: Every day | ORAL | Status: DC
  Administered 2013-10-16 – 2013-10-17 (×2): 20 mg via ORAL
  Filled 2013-10-16 (×3): qty 1

## 2013-10-16 MED ORDER — DOCUSATE SODIUM 100 MG PO CAPS
100.0000 mg | ORAL_CAPSULE | Freq: Two times a day (BID) | ORAL | Status: DC
  Administered 2013-10-16 – 2013-10-20 (×8): 100 mg via ORAL
  Filled 2013-10-16 (×10): qty 1

## 2013-10-16 MED ORDER — DILTIAZEM HCL 30 MG PO TABS
30.0000 mg | ORAL_TABLET | Freq: Three times a day (TID) | ORAL | Status: DC
  Administered 2013-10-16 – 2013-10-17 (×3): 30 mg via ORAL
  Filled 2013-10-16 (×6): qty 1

## 2013-10-16 MED ORDER — METOPROLOL TARTRATE 1 MG/ML IV SOLN
5.0000 mg | Freq: Once | INTRAVENOUS | Status: AC
  Administered 2013-10-16: 5 mg via INTRAVENOUS
  Filled 2013-10-16: qty 5

## 2013-10-16 NOTE — Progress Notes (Addendum)
Patient ID: Julie Haas, female   DOB: [birth year], 80 y.o.   MRN: [id number]  [facility] PROGRESS NOTE  Julie Haas DJS:[id number] DOB: 08/25/1925 DOA: 10/11/2013 PCP: Estill Dooms, MD  Chart reviewed. Events of the night noted.  Brief narrative: 80 y.o. female with HTN, atrial fibrillation on AC, recent admission for PNA s/p TEE/DCCV (discharged on 8/15) and presented to [facility] ED with main concern of progressively worsening shortness of breath, mostly exertional but occasionally present at rest. This as need associated with productive cough of yellow sputum, subjective fevers, chills, LE edema and 2 pillow orthopnea.   Assessment and Plan:      Acute hypoxic respiratory failure  - secondary to HCAP/CHF Improving. Now on [place]. Resume lasix. Steroids per pulmonary. For MBS to evaluate for aspiration -  recommendation to have CT chest repeated in 6-8 weeks to re-evaluate the areas of nodularity     HCAP (healthcare-associated pneumonia) Continue current    HTN (hypertension) controlled    Atrial fibrillation with RVR - continue Eliquis. Rate still fast. Increase metoprolol as blood pressure will tolerate    Acute on chronic diastolic heart failure Resume lasix. Needs better HR controll    Chronic left leg stasis ulcer  - continue wound care      Moderate malnutrition - advance diet as pt able to tolerate     Hypokalemia repleted  Code Status: DNR Family Communication: daughter 8/28 Disposition Plan: not stable for discharge  IV Access:   Peripheral IV Procedures and diagnostic studies:   Ct Chest Wo Contrast   10/11/2013  Patchy bilateral airspace process with nodularity most prominent over the right upper lobe likely due to infection. Patchy bronchiectatic change as described. Small bilateral pleural effusions. 1.3 cm pretracheal lymph node likely reactive. Recommend followup CT in 6-8 weeks to re-evaluate the areas of nodularity.  Cardiomegaly with  calcification of the mitral valve annulus and left atrial enlargement. Dg Chest Portable 1 View  10/11/2013   Cardiomegaly with chronic interstitial thickening. No overt congestive failure.  Multi focal pulmonary opacities. Primarily felt to represent areas of post infectious scarring. Cannot exclude progressive left lower lobe infection or aspiration.  Medical Consultants:   Cardiology  Other Consultants:   Physical therapy  Anti-Infectives:   Vancomycin 8/24 --> Aztreonam 8/24 --> azithro 8/28 -->   LOS: 5 days   HPI/Subjective: Breathing improved from 2 nights ago. Slept well  Objective: Filed Vitals:   10/15/13 1043 10/15/13 1359 10/15/13 2155 10/16/13 0354  BP: 115/70 124/70 125/69 110/84  Pulse: 128 72 96 125  Temp: 97.8 F (36.6 C) 98.5 F (36.9 C) 98 F (36.7 C) 97.8 F (36.6 C)  TempSrc: Oral Oral Oral Oral  Resp: 20 21 20 23   Height:      Weight:    92.1 kg (203 lb 0.7 oz)  SpO2: 98% 98% 95% 96%    Intake/Output Summary (Last 24 hours) at 10/16/13 1048 Last data filed at 10/16/13 0900  Gross per 24 hour  Intake    120 ml  Output   1125 ml  Net  -1005 ml   tele: atrial fib. Rate 130  Exam:   General:  On [place]. Coughing  HEENT: flushed  Cardiovascular: irreg irreg. Fast no MGR  Respiratory: tachypneic. Rales on left greater than right  Abdomen: Soft, non tender, non distended, bowel sounds present, no guarding  Ext: warm. Trace edema  Data Reviewed: Basic Metabolic Panel:  Recent Labs Lab 10/11/13 1243 10/12/13  0404 10/13/13 0515 10/15/13 0534  NA 133* 137 136* 135*  K 4.3 3.5* 3.4* 4.5  CL 94* 95* 96 95*  CO2 26 27 30 27   GLUCOSE 104* 92 88 153*  BUN 16 13 11 12   CREATININE 0.64 0.60 0.52 0.57  CALCIUM 8.4 7.9* 8.0* 8.3*  MG  --  1.7  --   --    CBC:  Recent Labs Lab 10/11/13 1243 10/12/13 0404 10/13/13 0515 10/15/13 0534  WBC 7.8 6.7 6.8 9.6  NEUTROABS 5.5  --   --   --   HGB 11.8* 11.1* 10.9* 12.4  HCT 36.2 33.1* 32.7*  38.3  MCV 92.8 93.2 92.6 96.7  PLT 337 281 293 376   Cardiac Enzymes:  Recent Labs Lab 10/11/13 1943 10/12/13 0126 10/12/13 0940  TROPONINI <0.30 <0.30 <0.30   Recent Results (from the past 240 hour(s))  URINE CULTURE     Status: None   Collection Time    10/11/13  1:25 PM      Result Value Ref Range Status   Specimen Description URINE, CLEAN CATCH   Final   Special Requests NONE   Final   Culture  Setup Time     Final   Value: 10/11/2013 20:09     Performed at [place]     Final   Value: 70,000 COLONIES/ML     Performed at [facility]   Culture     Final   Value: STAPHYLOCOCCUS SPECIES (COAGULASE NEGATIVE)     Note: RIFAMPIN AND GENTAMICIN SHOULD NOT BE USED AS SINGLE DRUGS FOR TREATMENT OF STAPH INFECTIONS.     Performed at [facility]   Report Status 10/15/2013 FINAL   Final   Organism ID, Bacteria STAPHYLOCOCCUS SPECIES (COAGULASE NEGATIVE)   Final  CULTURE, BLOOD (ROUTINE X 2)     Status: None   Collection Time    10/11/13  7:33 PM      Result Value Ref Range Status   Specimen Description BLOOD RIGHT ARM   Final   Special Requests BOTTLES DRAWN AEROBIC AND ANAEROBIC 10CC   Final   Culture  Setup Time     Final   Value: 10/12/2013 04:12     Performed at [facility]   Culture     Final   Value:        BLOOD CULTURE RECEIVED NO GROWTH TO DATE CULTURE WILL BE HELD FOR 5 DAYS BEFORE ISSUING A FINAL NEGATIVE REPORT     Performed at [facility]   Report Status PENDING   Incomplete  CULTURE, BLOOD (ROUTINE X 2)     Status: None   Collection Time    10/11/13  7:43 PM      Result Value Ref Range Status   Specimen Description BLOOD LEFT ARM   Final   Special Requests BOTTLES DRAWN AEROBIC AND ANAEROBIC 5CC   Final   Culture  Setup Time     Final   Value: 10/12/2013 04:13     Performed at [facility]   Culture     Final   Value:        BLOOD CULTURE RECEIVED NO GROWTH TO DATE CULTURE WILL BE HELD  FOR 5 DAYS BEFORE ISSUING A FINAL NEGATIVE REPORT     Performed at [facility]   Report Status PENDING   Incomplete  RESPIRATORY VIRUS PANEL     Status: None   Collection Time  10/14/13  4:50 PM      Result Value Ref Range Status   Source - RVPAN NASAL SWAB   Corrected   Comment: CORRECTED ON 08/28 AT 2020: PREVIOUSLY REPORTED AS NASAL SWAB   Respiratory Syncytial Virus A NOT DETECTED   Final   Respiratory Syncytial Virus B NOT DETECTED   Final   Influenza A NOT DETECTED   Final   Influenza B NOT DETECTED   Final   Parainfluenza 1 NOT DETECTED   Final   Parainfluenza 2 NOT DETECTED   Final   Parainfluenza 3 NOT DETECTED   Final   Metapneumovirus NOT DETECTED   Final   Rhinovirus NOT DETECTED   Final   Adenovirus NOT DETECTED   Final   Influenza A H1 NOT DETECTED   Final   Influenza A H3 NOT DETECTED   Final   Comment: (NOTE)           Normal Reference Range for each Analyte: NOT DETECTED     Testing performed using the Lumine[employer] xTAG Respiratory Viral Panel test     kit.     The analytical performance characteristics of this assay have been     determined by [facility].  The modifications have not been     cleared or approved by the FDA. This assay has been validated pursuant     to the CLIA regulations and is used for clinical purposes.     Performed at [facility]     Scheduled Meds: . apixaban  5 mg Oral BID  . aztreonam  1 g Intravenous Q8H  . furosemide  40 mg Intravenous BID  . hydrocerin   Topical Daily  . losartan  50 mg Oral Daily  . metoprolol  50 mg Oral BID  . potassium chloride SA  20 mEq Oral Daily  . vancomycin  750 mg Intravenous Q12H  . vitamin E  1,000 Units Oral Daily   Continuous Infusions:    Delfina Redwood, MD [facility] Pager [phone]  If 7PM-7AM, please contact night-coverage [email or web address] Password [email or web address] 10/15/2013, 8:16 AM

## 2013-10-16 NOTE — Procedures (Signed)
Objective Swallowing Evaluation: Modified Barium Swallowing Study  Patient Details  Name: Julie Haas MRN: [id number] Date of Birth: 14-Jan-1926  Today's Date: 10/16/2013 Time: 1145-1200 SLP Time Calculation (min): 15 min  Past Medical History:  Past Medical History  Diagnosis Date  . Hyperlipidemia   . Arrhythmia     PVC  . Retinal detachment   . Renal disorder   . Allergy   . Occlusion and stenosis of carotid artery without mention of cerebral infarction 03/24/2012  . Pneumonia, organism unspecified 11/192013  . Depressive disorder, not elsewhere classified 03/05/2011  . Other premature beats 03/05/2011  . Other seborrheic keratosis 11/20/2010  . Syncope and collapse 10/02/2010  . Palpitations 11/21/2009  . Unspecified essential hypertension 06/27/2009  . Cardiomegaly 06/27/2009  . Other generalized ischemic cerebrovascular disease 06/27/2009  . Dizziness and giddiness 06/27/2009  . Unspecified vitamin D deficiency 05/16/2009  . Family history of sudden cardiac death (SCD) 12/05/2008  . Rash and other nonspecific skin eruption 07/05/2008  . Edema 03/15/2008  . Obesity, unspecified 07/25/2006  . Varicose veins of lower extremities with inflammation 03/16/2003  . Shortness of breath    Past Surgical History:  Past Surgical History  Procedure Laterality Date  . Cholecystectomy  1983  . Cataract extraction Bilateral     Dr. [name]  . Tonsillectomy    . Keratosis    . Retinal detachment surgery Left   . Cardioversion N/A 10/01/2013    Procedure: CARDIOVERSION;  Surgeon: Sanda Klein, MD;  Location: [facility] ENDOSCOPY;  Service: Cardiovascular;  Laterality: N/A;  . Tee without cardioversion N/A 10/01/2013    Procedure: TRANSESOPHAGEAL ECHOCARDIOGRAM (TEE);  Surgeon: Sanda Klein, MD;  Location: [facility] ENDOSCOPY;  Service: Cardiovascular;  Laterality: N/A;   HPI:  80yo female with dx of acute respiratory failure in the setting of bilateral R>L PNA.  Hx HTN, AFib, CHF, recent admit for PNA  (d/c 8/15). Returned 8/24 with worsening SOB, cough, chills. CXR revealed multifocal PNA and pt admitted by [facility]. Course has been c/b AFib with RVR and acute on chronic dCHF. Respiratory status worse overnight requiring NRB; now back on [place].  Swallow eval ordered to r/o aspiration.       Assessment / Plan / Recommendation Clinical Impression  Dysphagia Diagnosis: Within Functional Limits Clinical impression: Pt presents with quite functional swallow with adequate mastication, age-related swallow delays, mild vallecular residue of solids, and consistent/reliable laryngeal closure with all tested consistencies.  No penetration/aspiration observed.  Esophageal sweep revealed barium stasis of solid materials; liquid wash facilitated clearance.  Recommend continued regular consistency diet, thin liquids - no SLP f/u warranted.             Diet Recommendation Regular;Thin liquid   Liquid Administration via: Cup;Straw Medication Administration: Whole meds with liquid Supervision: Patient able to self feed Compensations: Follow solids with liquid    Other  Recommendations Oral Care Recommendations: Oral care BID   Follow Up Recommendations  None           SLP Swallow Goals  n/a   General Date of Onset: 10/11/13 HPI: 80yo female with dx of acute respiratory failure in the setting of bilateral R>L PNA.  Hx HTN, AFib, CHF, recent admit for PNA (d/c 8/15). Returned 8/24 with worsening SOB, cough, chills. CXR revealed multifocal PNA and pt admitted by [facility]. Course has been c/b AFib with RVR and acute on chronic dCHF. Respiratory status worse overnight requiring NRB; now back on [place].  Swallow eval ordered to r/o aspiration.  Type of Study: Modified Barium Swallowing Study Reason for Referral: Objectively evaluate swallowing function Previous Swallow Assessment: bedside eval 10/15/13 Diet Prior to this Study: Regular;Thin liquids Temperature Spikes Noted: No Respiratory Status: Nasal  cannula History of Recent Intubation: No Behavior/Cognition: Alert;Cooperative;Pleasant mood Oral Cavity - Dentition: Adequate natural dentition Oral Motor / Sensory Function: Within functional limits Self-Feeding Abilities: Able to feed self Patient Positioning: Upright in chair Baseline Vocal Quality: Breathy;Hoarse Volitional Cough: Strong Volitional Swallow: Able to elicit Anatomy: Within functional limits Pharyngeal Secretions: Not observed secondary MBS    Reason for Referral Objectively evaluate swallowing function   Oral Phase Oral Preparation/Oral Phase Oral Phase: WFL   Pharyngeal Phase Pharyngeal Phase Pharyngeal Phase: Impaired Pharyngeal - Thin Pharyngeal - Thin Cup: Delayed swallow initiation Pharyngeal - Solids Pharyngeal - Puree: Delayed swallow initiation;Pharyngeal residue - valleculae  Cervical Esophageal Phase    GO   Julie Haas L. Julie Haas, [place] CCC/SLP Pager [phone]  Cervical Esophageal Phase Cervical Esophageal Phase: Impaired Cervical Esophageal Phase - Solids Puree: Other (Comment) (barium stasis; clears with liquid wash)         Julie Haas 10/16/2013, 12:16 PM

## 2013-10-16 NOTE — Progress Notes (Signed)
SUBJECTIVE: Pt uncertain if breathing is any better or the same when compared to yesterday. Says she doesn't pay attention to whether or not she has palpitations.     Intake/Output Summary (Last 24 hours) at 10/16/13 1245 Last data filed at 10/16/13 0900  Gross per 24 hour  Intake    120 ml  Output   1125 ml  Net  -1005 ml    Current Facility-Administered Medications  Medication Dose Route Frequency Provider Last Rate Last Dose  . acetaminophen (TYLENOL) tablet 650 mg  650 mg Oral Q6H PRN Julie Feil, MD       Or  . acetaminophen (TYLENOL) suppository 650 mg  650 mg Rectal Q6H PRN Julie Feil, MD      . apixaban (ELIQUIS) tablet 5 mg  5 mg Oral BID Julie Feil, MD   5 mg at 10/16/13 1058  . azithromycin (ZITHROMAX) 500 mg in dextrose 5 % 250 mL IVPB  500 mg Intravenous Q24H Julie Redwood, MD   500 mg at 10/16/13 1057  . aztreonam (AZACTAM) 1 g in dextrose 5 % 50 mL IVPB  1 g Intravenous Q8H Julie U Vann, DO   1 g at 10/16/13 0835  . docusate sodium (Haas) capsule 100 mg  100 mg Oral BID Julie Redwood, MD   100 mg at 10/16/13 1109  . furosemide (LASIX) tablet 20 mg  20 mg Oral Daily Julie Redwood, MD   20 mg at 10/16/13 1200  . hydrocerin (EUCERIN) cream   Topical Daily Julie Blaze, MD      . levalbuterol [facility]) nebulizer solution 0.63 mg  0.63 mg Nebulization Q4H PRN Julie Blaze, MD   0.63 mg at 10/15/13 0319  . magnesium hydroxide (MILK OF MAGNESIA) suspension 30 mL  30 mL Oral Daily PRN Julie Redwood, MD      . methylPREDNISolone sodium succinate (SOLU-MEDROL) 40 mg/mL injection 40 mg  40 mg Intravenous Q12H Julie Colace, NP   40 mg at 10/16/13 1059  . metoprolol tartrate (LOPRESSOR) tablet 37.5 mg  37.5 mg Oral BID Julie Redwood, MD      . ondansetron [facility]) tablet 4 mg  4 mg Oral Q6H PRN Julie Feil, MD       Or  . ondansetron (ZOFRAN) injection 4 mg  4 mg Intravenous Q6H PRN Julie Feil, MD      .  potassium chloride SA (K-DUR,KLOR-CON) CR tablet 20 mEq  20 mEq Oral Daily Julie Feil, MD   20 mEq at 10/16/13 1058  . sodium chloride 0.9 % injection 3 mL  3 mL Intravenous Q12H Julie Feil, MD   3 mL at 10/16/13 1058  . vancomycin (VANCOCIN) IVPB 750 mg/150 ml premix  750 mg Intravenous Q12H Julie Haas, RPH   750 mg at 10/16/13 0056  . vitamin E capsule 1,000 Units  1,000 Units Oral Daily Julie Feil, MD   1,000 Units at 10/16/13 1058    Filed Vitals:   10/15/13 1043 10/15/13 1359 10/15/13 2155 10/16/13 0354  BP: 115/70 124/70 125/69 110/84  Pulse: 128 72 96 125  Temp: 97.8 F (36.6 C) 98.5 F (36.9 C) 98 F (36.7 C) 97.8 F (36.6 C)  TempSrc: Oral Oral Oral Oral  Resp: 20 21 20 23   Height:      Weight:    203 lb 0.7 oz (92.1 kg)  SpO2: 98% 98% 95%  96%    PHYSICAL EXAM General: NAD, using oxygen by nasal cannula HEENT: Normal. Neck: No JVD, no thyromegaly.  Lungs: Diffuse exp wheezes and rhonchi b/l. CV: Nondisplaced PMI.  Irregular rhythm, tachycardic, normal S1/S2, no S3, no murmur.  Chronic venous stasis and ecchymoses in left leg.  Abdomen: Soft, nontender, obese.  Neurologic: Alert and oriented x 3.  Psych: Normal affect. Musculoskeletal: Normal range of motion. No gross deformities. Extremities: No clubbing or cyanosis.   TELEMETRY: Reviewed telemetry pt in rapid atrial fibrillation, HR 110-160 bpm.  LABS: Basic Metabolic Panel:  Recent Labs  10/15/13 0534  NA 135*  K 4.5  CL 95*  CO2 27  GLUCOSE 153*  BUN 12  CREATININE 0.57  CALCIUM 8.3*   Liver Function Tests: No results found for this basename: AST, ALT, ALKPHOS, BILITOT, PROT, ALBUMIN,  in the last 72 hours No results found for this basename: LIPASE, AMYLASE,  in the last 72 hours CBC:  Recent Labs  10/15/13 0534  WBC 9.6  HGB 12.4  HCT 38.3  MCV 96.7  PLT 376   Cardiac Enzymes: No results found for this basename: CKTOTAL, CKMB, CKMBINDEX, TROPONINI,  in  the last 72 hours BNP: No components found with this basename: POCBNP,  D-Dimer: No results found for this basename: DDIMER,  in the last 72 hours Hemoglobin A1C: No results found for this basename: HGBA1C,  in the last 72 hours Fasting Lipid Panel: No results found for this basename: CHOL, HDL, LDLCALC, TRIG, CHOLHDL, LDLDIRECT,  in the last 72 hours Thyroid Function Tests: No results found for this basename: TSH, T4TOTAL, FREET3, T3FREE, THYROIDAB,  in the last 72 hours Anemia Panel: No results found for this basename: VITAMINB12, FOLATE, FERRITIN, TIBC, IRON, RETICCTPCT,  in the last 72 hours  RADIOLOGY: Dg Chest 2 View  09/30/2013   EXAM: CHEST  2 VIEW  COMPARISON:  None.  FINDINGS: The heart size and mediastinal contours are within normal limits. Both lungs are clear. The visualized skeletal structures are unremarkable.  IMPRESSION: No active cardiopulmonary disease.   Electronically Signed   By: Julie Haas M.D.   On: 09/30/2013 08:04   Dg Chest 2 View  09/28/2013   CLINICAL DATA:  Cough  EXAM: CHEST  2 VIEW  COMPARISON:  09/27/2013  FINDINGS: Cardiac shadow is stable. Patchy infiltrates are again noted bilaterally. Increasing small left effusion is seen. No new focal abnormality is noted.  IMPRESSION: Patchy bilateral infiltrates.   Electronically Signed   By: Julie Haas M.D.   On: 09/28/2013 11:09   Ct Chest Wo Contrast  10/11/2013   CLINICAL DATA:  Recurrent pneumonia.  EXAM: CT CHEST WITHOUT CONTRAST  TECHNIQUE: Multidetector CT imaging of the chest was performed following the standard protocol without IV contrast.  COMPARISON:  CT 01/23/2009 and series if recent chest radiographs August 2015.  FINDINGS: Lungs are adequately inflated demonstrate a very small amount of bilateral pleural fluid. There is a patchy bilateral airspace process with some nodularity worse over the right upper lobe. Minimal patchy bronchiectatic change over the lingula, posterior right upper lobe and left  apex.  Mild cardiomegaly with a very small amount of pericardial fluid. There is moderate calcific plaque involving the left main, left and descending and lateral circumflex coronary arteries. Calcification over the mitral valve annulus with mild left atrial enlargement mild prominence of the main pulmonary arteries. There is a 1.3 cm pretracheal lymph node slightly larger. There is calcified plaque involving the thoracic aorta. Remaining  mediastinal structures are unremarkable.  Images through the upper abdomen demonstrated 1.1 cm hypodensity over the left dome of the liver unchanged likely a cyst. Evidence of a prior cholecystectomy. Remainder of the exam is unchanged  IMPRESSION: Patchy bilateral airspace process with nodularity most prominent over the right upper lobe likely due to infection. Patchy bronchiectatic change as described. Small bilateral pleural effusions. 1.3 cm pretracheal lymph node likely reactive. Recommend followup CT in 6-8 weeks to re-evaluate the areas of nodularity.  Cardiomegaly with calcification of the mitral valve annulus and left atrial enlargement. Atherosclerotic coronary artery disease. Small amount of pericardial fluid.  1.1 cm liver cyst unchanged.   Electronically Signed   By: Julie Haas M.D.   On: 10/11/2013 17:34   Dg Chest Port 1 View  10/15/2013   CLINICAL DATA:  Increased shortness of breath and cough.  EXAM: PORTABLE CHEST - 1 VIEW  COMPARISON:  10/11/2013  FINDINGS: Cardiac enlargement. Diffuse interstitial and alveolar parenchymal infiltrates, increasing since previous study. Changes may represent diffuse pneumonia or edema. Nodular infiltrates again demonstrated in the right upper lung. Blunting of the right costophrenic angle suggesting a developing right pleural effusion.  IMPRESSION: Increasing diffuse airspace and interstitial infiltrates in the lungs. Developing small right pleural effusion.   Electronically Signed   By: Lucienne Capers M.D.   On: 10/15/2013  01:21   Dg Chest Portable 1 View  10/11/2013   CLINICAL DATA:  Atrial fibrillation and tachycardia.  EXAM: PORTABLE CHEST - 1 VIEW  COMPARISON:  Chest radiograph of 09/30/2013.  CT of 01/23/2009  FINDINGS: Midline trachea. Moderate cardiomegaly. Chronic right-sided pleural thickening blunts the costophrenic angle. Possible small left pleural effusion. No pneumothorax. Interstitial thickening is chronic. Patchy right upper lobe peripheral opacity has been present back to 01/10/2012. There is also chronic right base pulmonary opacity. Left lower lobe pulmonary opacity may be progressive since 09/30/2013. Grossly similar to 09/28/2013.  IMPRESSION: Cardiomegaly with chronic interstitial thickening. No overt congestive failure.  Multi focal pulmonary opacities. Primarily felt to represent areas of post infectious scarring. Cannot exclude progressive left lower lobe infection or aspiration.  Given chronicity, CT may be informative to re-evaluate the pulmonary opacities.   Electronically Signed   By: Abigail Miyamoto M.D.   On: 10/11/2013 13:33   Dg Chest Port 1 View  09/27/2013   CLINICAL DATA:  Shortness of breath.  EXAM: PORTABLE CHEST - 1 VIEW  COMPARISON:  Chest radiograph performed 09/26/2013  FINDINGS: The lungs are well-aerated. Vascular congestion is noted, with diffuse bilateral airspace opacities, possibly reflecting multifocal pneumonia or pulmonary edema. Small bilateral pleural effusions are seen. No pneumothorax is identified.  The cardiomediastinal silhouette is mildly enlarged. No acute osseous abnormalities are seen.  IMPRESSION: Vascular congestion and mild cardiomegaly, with diffuse bilateral airspace opacities, possibly reflecting multifocal pneumonia or pulmonary edema. Small bilateral pleural effusions seen.   Electronically Signed   By: Garald Balding M.D.   On: 09/27/2013 04:55   Dg Chest Port 1 View  09/26/2013   CLINICAL DATA:  Dyspnea.  Hypoxia.  EXAM: PORTABLE CHEST - 1 VIEW  COMPARISON:   Chest x-ray 09/24/2013.  FINDINGS: Patchy areas of interstitial prominence and multifocal airspace disease are seen scattered throughout the lungs bilaterally (right greater than left). Small bilateral pleural effusions. There does not appear to be cephalization of the pulmonary vasculature. Heart size appears borderline enlarged, accentuated by low lung volumes and lordotic positioning on this portable radiograph. Upper mediastinal contours are within normal limits allowing for  these technique related limitations. Atherosclerosis in the thoracic aorta.  IMPRESSION: 1. Multifocal patchy asymmetrically distributed interstitial and airspace disease throughout the lungs bilaterally (right greater than left), concerning for multilobar pneumonia. 2. Small bilateral pleural effusions. 3. Borderline cardiomegaly. 4. Atherosclerosis.   Electronically Signed   By: Vinnie Langton M.D.   On: 09/26/2013 17:06   Dg Chest Port 1 View  09/24/2013   CLINICAL DATA:  Shortness of breath.  EXAM: PORTABLE CHEST - 1 VIEW  COMPARISON:  01/10/2012.  FINDINGS: There is chronic cardiomegaly. Aortic atherosclerosis. No acute aortic contour abnormality.  There is diffuse interstitial coarsening which is increased from previous. Chronic patchy opacities in the bilateral lungs, most confluent in the right upper and right lower chest. Based on chest CT 01/23/2009, these are likely postinfectious scarring. No definitive effusion. No pneumothorax.  IMPRESSION: When accounting for chronic lung scarring (likely postinfectious), no definite edema or pneumonia.   Electronically Signed   By: Jorje Guild M.D.   On: 09/24/2013 02:11   Dg Swallowing Func-speech Pathology  10/16/2013   Assunta Curtis, CCC-SLP     10/16/2013 12:17 PM Objective Swallowing Evaluation: Modified Barium Swallowing Study   Patient Details  Name: Julie Haas MRN: [id number] Date of Birth: 1925/06/21  Today's Date: 10/16/2013 Time: 1145-1200 SLP Time Calculation  (min): 15 min  Past Medical History:  Past Medical History  Diagnosis Date  . Hyperlipidemia   . Arrhythmia     PVC  . Retinal detachment   . Renal disorder   . Allergy   . Occlusion and stenosis of carotid artery without mention of  cerebral infarction 03/24/2012  . Pneumonia, organism unspecified 11/192013  . Depressive disorder, not elsewhere classified 03/05/2011  . Other premature beats 03/05/2011  . Other seborrheic keratosis 11/20/2010  . Syncope and collapse 10/02/2010  . Palpitations 11/21/2009  . Unspecified essential hypertension 06/27/2009  . Cardiomegaly 06/27/2009  . Other generalized ischemic cerebrovascular disease 06/27/2009  . Dizziness and giddiness 06/27/2009  . Unspecified vitamin D deficiency 05/16/2009  . Family history of sudden cardiac death (SCD) 12/29/2008  . Rash and other nonspecific skin eruption 07/05/2008  . Edema 03/15/2008  . Obesity, unspecified 07/25/2006  . Varicose veins of lower extremities with inflammation 03/16/2003   . Shortness of breath    Past Surgical History:  Past Surgical History  Procedure Laterality Date  . Cholecystectomy  1983  . Cataract extraction Bilateral     Dr. [name]  . Tonsillectomy    . Keratosis    . Retinal detachment surgery Left   . Cardioversion N/A 10/01/2013    Procedure: CARDIOVERSION;  Surgeon: Sanda Klein, MD;   Location: [facility] ENDOSCOPY;  Service: Cardiovascular;  Laterality:  N/A;  . Tee without cardioversion N/A 10/01/2013    Procedure: TRANSESOPHAGEAL ECHOCARDIOGRAM (TEE);  Surgeon:  Sanda Klein, MD;  Location: [facility] ENDOSCOPY;  Service:  Cardiovascular;  Laterality: N/A;   HPI:  80yo female with dx of acute respiratory failure in the setting  of bilateral R>L PNA.  Hx HTN, AFib, CHF, recent admit for PNA  (d/c 8/15). Returned 8/24 with worsening SOB, cough, chills. CXR  revealed multifocal PNA and pt admitted by [facility]. Course has been  c/b AFib with RVR and acute on chronic dCHF. Respiratory status  worse overnight requiring NRB; now back on [place].  Swallow  eval  ordered to r/o aspiration.       Assessment / Plan / Recommendation Clinical Impression  Dysphagia Diagnosis: Within Functional Limits Clinical impression:  Pt presents with quite functional swallow  with adequate mastication, age-related swallow delays, mild  vallecular residue of solids, and consistent/reliable laryngeal  closure with all tested consistencies.  No penetration/aspiration  observed.  Esophageal sweep revealed barium stasis of solid  materials; liquid wash facilitated clearance.  Recommend  continued regular consistency diet, thin liquids - no SLP f/u  warranted.             Diet Recommendation Regular;Thin liquid   Liquid Administration via: Cup;Straw Medication Administration: Whole meds with liquid Supervision: Patient able to self feed Compensations: Follow solids with liquid    Other  Recommendations Oral Care Recommendations: Oral care BID   Follow Up Recommendations  None           SLP Swallow Goals  n/a   General Date of Onset: 10/11/13 HPI: 80yo female with dx of acute respiratory failure in the  setting of bilateral R>L PNA.  Hx HTN, AFib, CHF, recent admit  for PNA (d/c 8/15). Returned 8/24 with worsening SOB, cough,  chills. CXR revealed multifocal PNA and pt admitted by [facility].  Course has been c/b AFib with RVR and acute on chronic dCHF.  Respiratory status worse overnight requiring NRB; now back on [place].   Swallow eval ordered to r/o aspiration.   Type of Study: Modified Barium Swallowing Study Reason for Referral: Objectively evaluate swallowing function Previous Swallow Assessment: bedside eval 10/15/13 Diet Prior to this Study: Regular;Thin liquids Temperature Spikes Noted: No Respiratory Status: Nasal cannula History of Recent Intubation: No Behavior/Cognition: Alert;Cooperative;Pleasant mood Oral Cavity - Dentition: Adequate natural dentition Oral Motor / Sensory Function: Within functional limits Self-Feeding Abilities: Able to feed self Patient Positioning: Upright in chair  Baseline Vocal Quality: Breathy;Hoarse Volitional Cough: Strong Volitional Swallow: Able to elicit Anatomy: Within functional limits Pharyngeal Secretions: Not observed secondary MBS    Reason for Referral Objectively evaluate swallowing function   Oral Phase Oral Preparation/Oral Phase Oral Phase: WFL   Pharyngeal Phase Pharyngeal Phase Pharyngeal Phase: Impaired Pharyngeal - Thin Pharyngeal - Thin Cup: Delayed swallow initiation Pharyngeal - Solids Pharyngeal - Puree: Delayed swallow initiation;Pharyngeal residue  - valleculae  Cervical Esophageal Phase    GO   Amanda L. [place], [place] CCC/SLP Pager [phone]  Cervical Esophageal Phase Cervical Esophageal Phase: Impaired Cervical Esophageal Phase - Solids Puree: Other (Comment) (barium stasis; clears with liquid wash)         Julie Haas 10/16/2013, 12:16 PM       ASSESSMENT AND PLAN: 1. Atrial fibrillation with RVR: Being driven by underlying pulmonary processes. Metoprolol increased to 37.5 mg bid by PTH. Will need to monitor for bronchospasms. Normal LV systolic function with moderate left atrial enlargement by echo earlier this month. I will add low-dose short-acting diltiazem 30 mg tid and titrate as BP tolerates. If need be, metoprolol dose can be reduced to accomodate diltiazem. Continue Eliquis. Amiodarone has since been discontinued.  2. Acute diastolic heart failure: Likely secondary to rapid atrial fibrillation, which is being driven by underlying pulmonary processes. Continue oral Lasix.  3. Acute hypoxic respiratory failure: Secondary to HCAP and acute DCHF. Currently on broad-spectrum antimicrobial therapy with azithromycin, aztreonam, and vancomycin, along with IV steroids and nebulizer/inhaler therapy.  4. Dysphagia: Modified barium swallow study showed no evidence for aspiration, with age-related delays noted.   Kate Sable, M.D., F.A.C.C.

## 2013-10-16 NOTE — Progress Notes (Signed)
Name: Julie Haas MRN: [id number] DOB: 1925-12-21    ADMISSION DATE:  10/11/2013 CONSULTATION DATE:  8/27  REFERRING MD :  Algis Liming  PRIMARY SERVICE:  [facility]   CHIEF COMPLAINT:  HCAP  BRIEF PATIENT DESCRIPTION:  80yo female with hx HTN, AFib, CHF, recent admit for PNA (d/c 8/15).  Returned 8/24 with worsening SOB, cough, chills.  CXR revealed multifocal PNA and pt admitted by [facility].  Course has been c/b AFib with RVR and acute on chronic dCHF.  PCCM consulted for assist with HCAP.   SIGNIFICANT EVENTS / STUDIES:  CT chest 8/24>>>patchy bilat asd with nodularity most prominent RUL, small bilat effusions.   LINES / TUBES: none  CULTURES: Urine 8/24>>>neg BCx2 8/24>>>staph epi resp viral culture 8/27>>>neg u strep 8/28>>>neg u legionella 8/28>>>neg  ANTIBIOTICS: Vanc >>> Aztreonam 8/24>>> azithro 8/28>>>  EVENTS 8/28. Required increased O2. Steroid trial initiated. DNR re-validated.    SUBJECTIVE:  Improved.  On steroids, diuresed 4L   VITAL SIGNS: Temp:  [97.8 F (36.6 C)-98.5 F (36.9 C)] 97.8 F (36.6 C) (08/29 0354) Pulse Rate:  [72-128] 125 (08/29 0354) Resp:  [20-23] 23 (08/29 0354) BP: (110-125)/(69-84) 110/84 mmHg (08/29 0354) SpO2:  [95 %-98 %] 96 % (08/29 0354) Weight:  [92.1 kg (203 lb 0.7 oz)] 92.1 kg (203 lb 0.7 oz) (08/29 0354) 4 liters   Intake/Output Summary (Last 24 hours) at 10/16/13 0944 Last data filed at 10/16/13 0402  Gross per 24 hour  Intake      0 ml  Output    725 ml  Net   -725 ml   PHYSICAL EXAMINATION: General:  Chronically ill appearing female, NAD  Neuro:  Awake, alert,  HEENT:  Mm moist, no JVD  Cardiovascular:  s1s2 irreg, tachy Lungs:  Improved BS Abdomen:  Soft, +bs Musculoskeletal:  Warm and dry, no sig edema     Recent Labs Lab 10/12/13 0404 10/13/13 0515 10/15/13 0534  NA 137 136* 135*  K 3.5* 3.4* 4.5  CL 95* 96 95*  CO2 27 30 27   BUN 13 11 12   CREATININE 0.60 0.52 0.57  GLUCOSE 92 88 153*     Recent Labs Lab 10/12/13 0404 10/13/13 0515 10/15/13 0534  HGB 11.1* 10.9* 12.4  HCT 33.1* 32.7* 38.3  WBC 6.7 6.8 9.6  PLT 281 293 376   Dg Chest Port 1 View  10/15/2013   CLINICAL DATA:  Increased shortness of breath and cough.  EXAM: PORTABLE CHEST - 1 VIEW  COMPARISON:  10/11/2013  FINDINGS: Cardiac enlargement. Diffuse interstitial and alveolar parenchymal infiltrates, increasing since previous study. Changes may represent diffuse pneumonia or edema. Nodular infiltrates again demonstrated in the right upper lung. Blunting of the right costophrenic angle suggesting a developing right pleural effusion.  IMPRESSION: Increasing diffuse airspace and interstitial infiltrates in the lungs. Developing small right pleural effusion.   Electronically Signed   By: Lucienne Capers M.D.   On: 10/15/2013 01:21   No film 8/29  ASSESSMENT / PLAN: Acute respiratory failure in the setting of bilateral R>L pulmonary infiltrates. D dx include HCAP w/ ARDS, viral pneumonitis, pulmonary edema, cryptogenic organizing PNA.   Clinically better on steroids.  Amiodarone stopped 8/28 Resp viral panel neg; ESR high 95  PLAN -  -Cont abx as above  -diuresis as BP will allow  -pulm hygiene  -supplemental O2 -f/u cxr 8/30 -will need f/u CT chest 6-8 weeks - cont empiric steroid trial Pt is established DNR -amiodarone now OFF  AFib with  RVR - s/p TEE and failed cardioversion last admit Acute on chronic dCHF Plan -  -Cards following  -Cont lopressor, eliquis  -Cont diuresis as tol    [place]  [phone]  Cell  [phone]  If no response or cell goes to voicemail, call beeper [phone]  9:48 AM 10/16/2013

## 2013-10-16 NOTE — Progress Notes (Signed)
Pt has done well today ambulating in room and sitting up in chair.  Pt was 99% on 4L, so I decreased her oxygen to 3L.  O2 Sat maintained 91% but pt was more tachypneic when she was sitting up.  Placed pt back on 4L.  Pt states she is feeling very tired after being up all day.

## 2013-10-17 ENCOUNTER — Inpatient Hospital Stay (HOSPITAL_COMMUNITY): Payer: Medicare Other

## 2013-10-17 DIAGNOSIS — I359 Nonrheumatic aortic valve disorder, unspecified: Secondary | ICD-10-CM

## 2013-10-17 DIAGNOSIS — Z710 Person encountering health services to consult on behalf of another person: Secondary | ICD-10-CM

## 2013-10-17 DIAGNOSIS — I6529 Occlusion and stenosis of unspecified carotid artery: Secondary | ICD-10-CM

## 2013-10-17 LAB — VANCOMYCIN, TROUGH: VANCOMYCIN TR: 13.5 ug/mL (ref 10.0–20.0)

## 2013-10-17 MED ORDER — DILTIAZEM HCL 30 MG PO TABS
30.0000 mg | ORAL_TABLET | Freq: Four times a day (QID) | ORAL | Status: DC
  Administered 2013-10-17 – 2013-10-18 (×4): 30 mg via ORAL
  Filled 2013-10-17 (×8): qty 1

## 2013-10-17 MED ORDER — VANCOMYCIN HCL IN DEXTROSE 1-5 GM/200ML-% IV SOLN
1000.0000 mg | Freq: Two times a day (BID) | INTRAVENOUS | Status: DC
  Administered 2013-10-17 – 2013-10-18 (×3): 1000 mg via INTRAVENOUS
  Filled 2013-10-17 (×3): qty 200

## 2013-10-17 NOTE — Progress Notes (Signed)
ANTIBIOTIC CONSULT NOTE - FOLLOW UP  Pharmacy Consult for Vancomcyin Indication: pneumonia  Allergies  Allergen Reactions  . Codeine     Unknown  . Cucumber Extract Nausea And Vomiting  . Diuretic [Buchu-Cornsilk-Ch Grass-Hydran]     Unknown  . Lipitor [Atorvastatin Calcium]     Unknown  . Penicillins Other (See Comments)    unknown  . Pravachol     Unknown    Patient Measurements: Height: 5' 2"  (157.5 cm) Weight: 205 lb 11 oz (93.3 kg) IBW/kg (Calculated) : 50.1  Vital Signs: Temp: 97.9 F (36.6 C) (08/30 0400) Temp src: Oral (08/30 0400) BP: 129/83 mmHg (08/30 0400) Pulse Rate: 98 (08/30 0400) Intake/Output from previous day: 08/29 0701 - 08/30 0700 In: 243 [P.O.:240; I.V.:3] Out: 1100 [Urine:1100] Intake/Output from this shift: Total I/O In: 240 [P.O.:240] Out: 301 [Urine:300; Stool:1]  Labs:  Recent Labs  10/15/13 0534  WBC 9.6  HGB 12.4  PLT 376  CREATININE 0.57   Estimated Creatinine Clearance: 54.7 ml/min (by C-G formula based on Cr of 0.57).  Recent Labs  10/17/13 1103  VANCOTROUGH 13.5     Microbiology: Recent Results (from the past 720 hour(s))  URINE CULTURE     Status: None   Collection Time    09/26/13 10:21 AM      Result Value Ref Range Status   Specimen Description URINE, CATHETERIZED   Final   Special Requests NONE   Final   Culture  Setup Time     Final   Value: 09/26/2013 18:53     Performed at [facility]     Final   Value: NO GROWTH     Performed at [facility]   Culture     Final   Value: NO GROWTH     Performed at [facility]   Report Status 09/27/2013 FINAL   Final  URINE CULTURE     Status: None   Collection Time    10/11/13  1:25 PM      Result Value Ref Range Status   Specimen Description URINE, CLEAN CATCH   Final   Special Requests NONE   Final   Culture  Setup Time     Final   Value: 10/11/2013 20:09     Performed at [facility]     Final    Value: 70,000 COLONIES/ML     Performed at [facility]   Culture     Final   Value: STAPHYLOCOCCUS SPECIES (COAGULASE NEGATIVE)     Note: RIFAMPIN AND GENTAMICIN SHOULD NOT BE USED AS SINGLE DRUGS FOR TREATMENT OF STAPH INFECTIONS.     Performed at [facility]   Report Status 10/15/2013 FINAL   Final   Organism ID, Bacteria STAPHYLOCOCCUS SPECIES (COAGULASE NEGATIVE)   Final  CULTURE, BLOOD (ROUTINE X 2)     Status: None   Collection Time    10/11/13  7:33 PM      Result Value Ref Range Status   Specimen Description BLOOD RIGHT ARM   Final   Special Requests BOTTLES DRAWN AEROBIC AND ANAEROBIC 10CC   Final   Culture  Setup Time     Final   Value: 10/12/2013 04:12     Performed at [facility]   Culture     Final   Value:        BLOOD CULTURE RECEIVED NO GROWTH TO DATE CULTURE WILL BE HELD FOR  5 DAYS BEFORE ISSUING A FINAL NEGATIVE REPORT     Performed at [facility]   Report Status PENDING   Incomplete  CULTURE, BLOOD (ROUTINE X 2)     Status: None   Collection Time    10/11/13  7:43 PM      Result Value Ref Range Status   Specimen Description BLOOD LEFT ARM   Final   Special Requests BOTTLES DRAWN AEROBIC AND ANAEROBIC 5CC   Final   Culture  Setup Time     Final   Value: 10/12/2013 04:13     Performed at [facility]   Culture     Final   Value:        BLOOD CULTURE RECEIVED NO GROWTH TO DATE CULTURE WILL BE HELD FOR 5 DAYS BEFORE ISSUING A FINAL NEGATIVE REPORT     Performed at [facility]   Report Status PENDING   Incomplete  RESPIRATORY VIRUS PANEL     Status: None   Collection Time    10/14/13  4:50 PM      Result Value Ref Range Status   Source - RVPAN NASAL SWAB   Corrected   Comment: CORRECTED ON 08/28 AT 2020: PREVIOUSLY REPORTED AS NASAL SWAB   Respiratory Syncytial Virus A NOT DETECTED   Final   Respiratory Syncytial Virus B NOT DETECTED   Final   Influenza A NOT DETECTED   Final   Influenza B NOT DETECTED    Final   Parainfluenza 1 NOT DETECTED   Final   Parainfluenza 2 NOT DETECTED   Final   Parainfluenza 3 NOT DETECTED   Final   Metapneumovirus NOT DETECTED   Final   Rhinovirus NOT DETECTED   Final   Adenovirus NOT DETECTED   Final   Influenza A H1 NOT DETECTED   Final   Influenza A H3 NOT DETECTED   Final   Comment: (NOTE)           Normal Reference Range for each Analyte: NOT DETECTED     Testing performed using the Luminex xTAG Respiratory Viral Panel test     kit.     The analytical performance characteristics of this assay have been     determined by [facility].  The modifications have not been     cleared or approved by the FDA. This assay has been validated pursuant     to the CLIA regulations and is used for clinical purposes.     Performed at [facility]   Start     Dose/Rate Route Frequency Ordered Stop   10/15/13 1000  azithromycin (ZITHROMAX) 500 mg in dextrose 5 % 250 mL IVPB     500 mg 250 mL/hr over 60 Minutes Intravenous Every 24 hours 10/15/13 0831     10/14/13 1200  vancomycin (VANCOCIN) IVPB 750 mg/150 ml premix     750 mg 150 mL/hr over 60 Minutes Intravenous Every 12 hours 10/14/13 1110     10/13/13 1200  vancomycin (VANCOCIN) 1,250 mg in sodium chloride 0.9 % 250 mL IVPB  Status:  Discontinued     1,250 mg 166.7 mL/hr over 90 Minutes Intravenous Every 24 hours 10/13/13 1046 10/14/13 1110   10/12/13 0000  vancomycin (VANCOCIN) IVPB 750 mg/150 ml premix  Status:  Discontinued     750 mg 150 mL/hr over 60 Minutes Intravenous Every 12 hours 10/11/13 1509 10/13/13 1046   10/12/13 0000  aztreonam (AZACTAM) 1  g in dextrose 5 % 50 mL IVPB     1 g 100 mL/hr over 30 Minutes Intravenous Every 8 hours 10/11/13 1523 10/18/13 2359   10/11/13 1430  vancomycin (VANCOCIN) IVPB 1000 mg/200 mL premix     1,000 mg 200 mL/hr over 60 Minutes Intravenous STAT 10/11/13 1415 10/11/13 1519   10/11/13 1415  aztreonam (AZACTAM) 2 g in dextrose 5 %  50 mL IVPB     2 g 100 mL/hr over 30 Minutes Intravenous  Once 10/11/13 1408 10/11/13 1828      Assessment: 80 y.o.F admitted 10/11/2013 with SOB, cough found to have HCAP. Pharmacy consulted to dose vanc/aztreonam and provide discharge education for apixiban  PMH: recent adimt for afib/RVR (s/p TEE/DCCV), PNA discharged on 8/15. Diastolic HF, HTN  Anticoagulation/heme: Afib, Eliquis PTA (not taking) 90m BID. CBC stable (8/27)  Infectious Disease: HCAP.  Afebrile. WBC 9.6(8/27)  Vanc 8/24>> Aztreonam 8/24 >> Azithro 8/28>>  8/24 BCx2: NGTD 8/24 UCx: 70K CNS, S vanc, gent, NTF, TCN  Cardiovascular: HTN, diast HF, HLD, afib s/p failed CV last admit.  Meds: dilt, furo 20 po daily,metop , K+. BP 100-120ss with HR 120-90s   Endocrinology: CBGs < 150s on IV Solumedrol  Nephrology: CrCl ~54 ml/min, lytes wnl  PTA Medication Issues: none  Best Practices: apixaban  Goal of Therapy Vancomycin trough level 15-20 mcg/ml  Plan Change Vanc to 1g IV q12h  Continue azithromycin and aztreonam Follow up SCr, UOP, cultures, clinical course and adjust as clinically indicated.   Thank you for allowing pharmacy to be a part of this patients care team.  JRowe RobertPharm.D., BCPS, AQ-Cardiology Clinical Pharmacist 10/17/2013 1:48 PM Pager: [phone]: [phone]

## 2013-10-17 NOTE — Progress Notes (Addendum)
Patient ID: Julie Haas, female   DOB: 1925-09-07, 80 y.o.   MRN: [id number]       SUBJECTIVE: Pt says "I don't pay attention to anything" when asked about her symptoms. She denies palpitations. Sitting upright. Frustrated about being in hospital but spirits are good this morning. Her daughter who is visiting from [place], Julie Haas, says her mother has battled repetitive bouts of pneumonia for the past 40 years. Says her mother is very social and perked up last night after having visitors. Now down to 2L from 4L, sats 99%.    Intake/Output Summary (Last 24 hours) at 10/17/13 1045 Last data filed at 10/17/13 0616  Gross per 24 hour  Intake    123 ml  Output    700 ml  Net   -577 ml    Current Facility-Administered Medications  Medication Dose Route Frequency Provider Last Rate Last Dose  . acetaminophen (TYLENOL) tablet 650 mg  650 mg Oral Q6H PRN Kinnie Feil, MD   650 mg at 10/16/13 2215   Or  . acetaminophen (TYLENOL) suppository 650 mg  650 mg Rectal Q6H PRN Kinnie Feil, MD      . apixaban (ELIQUIS) tablet 5 mg  5 mg Oral BID Kinnie Feil, MD   5 mg at 10/17/13 1022  . azithromycin (ZITHROMAX) 500 mg in dextrose 5 % 250 mL IVPB  500 mg Intravenous Q24H Delfina Redwood, MD   500 mg at 10/17/13 1030  . aztreonam (AZACTAM) 1 g in dextrose 5 % 50 mL IVPB  1 g Intravenous Q8H Jessica U Vann, DO   1 g at 10/17/13 0900  . diltiazem (CARDIZEM) tablet 30 mg  30 mg Oral 3 times per day Herminio Commons, MD   30 mg at 10/17/13 0520  . docusate sodium (COLACE) capsule 100 mg  100 mg Oral BID Delfina Redwood, MD   100 mg at 10/17/13 1023  . furosemide (LASIX) tablet 20 mg  20 mg Oral Daily Delfina Redwood, MD   20 mg at 10/17/13 1023  . hydrocerin (EUCERIN) cream   Topical Daily Theodis Blaze, MD   1 application at 41/66/06 1039  . levalbuterol (XOPENEX) nebulizer solution 0.63 mg  0.63 mg Nebulization Q4H PRN Theodis Blaze, MD   0.63 mg at 10/16/13 2215  . magnesium  hydroxide (MILK OF MAGNESIA) suspension 30 mL  30 mL Oral Daily PRN Delfina Redwood, MD      . methylPREDNISolone sodium succinate (SOLU-MEDROL) 40 mg/mL injection 40 mg  40 mg Intravenous Q12H Erick Colace, NP   40 mg at 10/16/13 2217  . metoprolol tartrate (LOPRESSOR) tablet 37.5 mg  37.5 mg Oral BID Delfina Redwood, MD   37.5 mg at 10/17/13 1024  . ondansetron (ZOFRAN) tablet 4 mg  4 mg Oral Q6H PRN Kinnie Feil, MD       Or  . ondansetron (ZOFRAN) injection 4 mg  4 mg Intravenous Q6H PRN Kinnie Feil, MD      . potassium chloride SA (K-DUR,KLOR-CON) CR tablet 20 mEq  20 mEq Oral Daily Kinnie Feil, MD   20 mEq at 10/17/13 1023  . sodium chloride 0.9 % injection 3 mL  3 mL Intravenous Q12H Kinnie Feil, MD   3 mL at 10/17/13 1026  . vancomycin (VANCOCIN) IVPB 750 mg/150 ml premix  750 mg Intravenous Q12H Crystal Chelsea, RPH   750 mg at 10/16/13 2346  .  vitamin E capsule 1,000 Units  1,000 Units Oral Daily Kinnie Feil, MD   1,000 Units at 10/17/13 1022    Filed Vitals:   10/16/13 0354 10/16/13 1356 10/16/13 2139 10/17/13 0400  BP: 110/84 106/63 127/75 129/83  Pulse: 125 103 126 98  Temp: 97.8 F (36.6 C)  98.4 F (36.9 C) 97.9 F (36.6 C)  TempSrc: Oral Oral Oral Oral  Resp: 23 18 20 20   Height:      Weight: 203 lb 0.7 oz (92.1 kg)   205 lb 11 oz (93.3 kg)  SpO2: 96% 97% 93% 93%    PHYSICAL EXAM General: NAD, using oxygen by nasal cannula  HEENT: Normal.  Neck: No JVD, no thyromegaly.  Lungs: No wheezes, very faint scattered intermittent crackles. CV: Nondisplaced PMI. Irregular rhythm, mildly tachycardic, normal S1/S2, no S3, no murmur. Chronic venous stasis and ecchymoses in left leg.  Abdomen: Soft, nontender, obese.  Neurologic: Alert and oriented x 3.  Psych: Normal affect.  Musculoskeletal: Normal range of motion. No gross deformities.  Extremities: No clubbing or cyanosis.    TELEMETRY: Reviewed telemetry pt in atrial  fibrillation with PVC's.  LABS: Basic Metabolic Panel:  Recent Labs  10/15/13 0534  NA 135*  K 4.5  CL 95*  CO2 27  GLUCOSE 153*  BUN 12  CREATININE 0.57  CALCIUM 8.3*   Liver Function Tests: No results found for this basename: AST, ALT, ALKPHOS, BILITOT, PROT, ALBUMIN,  in the last 72 hours No results found for this basename: LIPASE, AMYLASE,  in the last 72 hours CBC:  Recent Labs  10/15/13 0534  WBC 9.6  HGB 12.4  HCT 38.3  MCV 96.7  PLT 376   Cardiac Enzymes: No results found for this basename: CKTOTAL, CKMB, CKMBINDEX, TROPONINI,  in the last 72 hours BNP: No components found with this basename: POCBNP,  D-Dimer: No results found for this basename: DDIMER,  in the last 72 hours Hemoglobin A1C: No results found for this basename: HGBA1C,  in the last 72 hours Fasting Lipid Panel: No results found for this basename: CHOL, HDL, LDLCALC, TRIG, CHOLHDL, LDLDIRECT,  in the last 72 hours Thyroid Function Tests: No results found for this basename: TSH, T4TOTAL, FREET3, T3FREE, THYROIDAB,  in the last 72 hours Anemia Panel: No results found for this basename: VITAMINB12, FOLATE, FERRITIN, TIBC, IRON, RETICCTPCT,  in the last 72 hours  RADIOLOGY: Dg Chest 2 View  10/17/2013   CLINICAL DATA:  80 year old female shortness of Breath. Pneumonia. atrial fibrillation Initial encounter.  EXAM: CHEST  2 VIEW  COMPARISON:  10/15/2013 and earlier.  FINDINGS: Portable AP semi upright view at 0711 hrs. Interval larger and more confluent bilateral nodular pulmonary opacities. As before, the left base is most heavily affected. Mild veiling opacity at both lung bases suggesting small effusions. Stable cardiac size and mediastinal contours. Visualized tracheal air column is within normal limits.  IMPRESSION: Radiographic progression of bilateral pneumonia, with larger more confluent bilateral opacities. Small effusions.   Electronically Signed   By: Lars Pinks M.D.   On: 10/17/2013 07:46    Dg Chest 2 View  09/30/2013   EXAM: CHEST  2 VIEW  COMPARISON:  None.  FINDINGS: The heart size and mediastinal contours are within normal limits. Both lungs are clear. The visualized skeletal structures are unremarkable.  IMPRESSION: No active cardiopulmonary disease.   Electronically Signed   By: Marin Olp M.D.   On: 09/30/2013 08:04   Dg Chest 2 View  09/28/2013   CLINICAL DATA:  Cough  EXAM: CHEST  2 VIEW  COMPARISON:  09/27/2013  FINDINGS: Cardiac shadow is stable. Patchy infiltrates are again noted bilaterally. Increasing small left effusion is seen. No new focal abnormality is noted.  IMPRESSION: Patchy bilateral infiltrates.   Electronically Signed   By: Inez Catalina M.D.   On: 09/28/2013 11:09   Ct Chest Wo Contrast  10/11/2013   CLINICAL DATA:  Recurrent pneumonia.  EXAM: CT CHEST WITHOUT CONTRAST  TECHNIQUE: Multidetector CT imaging of the chest was performed following the standard protocol without IV contrast.  COMPARISON:  CT 01/23/2009 and series if recent chest radiographs August 2015.  FINDINGS: Lungs are adequately inflated demonstrate a very small amount of bilateral pleural fluid. There is a patchy bilateral airspace process with some nodularity worse over the right upper lobe. Minimal patchy bronchiectatic change over the lingula, posterior right upper lobe and left apex.  Mild cardiomegaly with a very small amount of pericardial fluid. There is moderate calcific plaque involving the left main, left and descending and lateral circumflex coronary arteries. Calcification over the mitral valve annulus with mild left atrial enlargement mild prominence of the main pulmonary arteries. There is a 1.3 cm pretracheal lymph node slightly larger. There is calcified plaque involving the thoracic aorta. Remaining mediastinal structures are unremarkable.  Images through the upper abdomen demonstrated 1.1 cm hypodensity over the left dome of the liver unchanged likely a cyst. Evidence of a prior  cholecystectomy. Remainder of the exam is unchanged  IMPRESSION: Patchy bilateral airspace process with nodularity most prominent over the right upper lobe likely due to infection. Patchy bronchiectatic change as described. Small bilateral pleural effusions. 1.3 cm pretracheal lymph node likely reactive. Recommend followup CT in 6-8 weeks to re-evaluate the areas of nodularity.  Cardiomegaly with calcification of the mitral valve annulus and left atrial enlargement. Atherosclerotic coronary artery disease. Small amount of pericardial fluid.  1.1 cm liver cyst unchanged.   Electronically Signed   By: Marin Olp M.D.   On: 10/11/2013 17:34   Dg Chest Port 1 View  10/15/2013   CLINICAL DATA:  Increased shortness of breath and cough.  EXAM: PORTABLE CHEST - 1 VIEW  COMPARISON:  10/11/2013  FINDINGS: Cardiac enlargement. Diffuse interstitial and alveolar parenchymal infiltrates, increasing since previous study. Changes may represent diffuse pneumonia or edema. Nodular infiltrates again demonstrated in the right upper lung. Blunting of the right costophrenic angle suggesting a developing right pleural effusion.  IMPRESSION: Increasing diffuse airspace and interstitial infiltrates in the lungs. Developing small right pleural effusion.   Electronically Signed   By: Lucienne Capers M.D.   On: 10/15/2013 01:21   Dg Chest Portable 1 View  10/11/2013   CLINICAL DATA:  Atrial fibrillation and tachycardia.  EXAM: PORTABLE CHEST - 1 VIEW  COMPARISON:  Chest radiograph of 09/30/2013.  CT of 01/23/2009  FINDINGS: Midline trachea. Moderate cardiomegaly. Chronic right-sided pleural thickening blunts the costophrenic angle. Possible small left pleural effusion. No pneumothorax. Interstitial thickening is chronic. Patchy right upper lobe peripheral opacity has been present back to 01/10/2012. There is also chronic right base pulmonary opacity. Left lower lobe pulmonary opacity may be progressive since 09/30/2013. Grossly  similar to 09/28/2013.  IMPRESSION: Cardiomegaly with chronic interstitial thickening. No overt congestive failure.  Multi focal pulmonary opacities. Primarily felt to represent areas of post infectious scarring. Cannot exclude progressive left lower lobe infection or aspiration.  Given chronicity, CT may be informative to re-evaluate the pulmonary opacities.  Electronically Signed   By: Abigail Miyamoto M.D.   On: 10/11/2013 13:33   Dg Chest Port 1 View  09/27/2013   CLINICAL DATA:  Shortness of breath.  EXAM: PORTABLE CHEST - 1 VIEW  COMPARISON:  Chest radiograph performed 09/26/2013  FINDINGS: The lungs are well-aerated. Vascular congestion is noted, with diffuse bilateral airspace opacities, possibly reflecting multifocal pneumonia or pulmonary edema. Small bilateral pleural effusions are seen. No pneumothorax is identified.  The cardiomediastinal silhouette is mildly enlarged. No acute osseous abnormalities are seen.  IMPRESSION: Vascular congestion and mild cardiomegaly, with diffuse bilateral airspace opacities, possibly reflecting multifocal pneumonia or pulmonary edema. Small bilateral pleural effusions seen.   Electronically Signed   By: Garald Balding M.D.   On: 09/27/2013 04:55   Dg Chest Port 1 View  09/26/2013   CLINICAL DATA:  Dyspnea.  Hypoxia.  EXAM: PORTABLE CHEST - 1 VIEW  COMPARISON:  Chest x-ray 09/24/2013.  FINDINGS: Patchy areas of interstitial prominence and multifocal airspace disease are seen scattered throughout the lungs bilaterally (right greater than left). Small bilateral pleural effusions. There does not appear to be cephalization of the pulmonary vasculature. Heart size appears borderline enlarged, accentuated by low lung volumes and lordotic positioning on this portable radiograph. Upper mediastinal contours are within normal limits allowing for these technique related limitations. Atherosclerosis in the thoracic aorta.  IMPRESSION: 1. Multifocal patchy asymmetrically distributed  interstitial and airspace disease throughout the lungs bilaterally (right greater than left), concerning for multilobar pneumonia. 2. Small bilateral pleural effusions. 3. Borderline cardiomegaly. 4. Atherosclerosis.   Electronically Signed   By: Vinnie Langton M.D.   On: 09/26/2013 17:06   Dg Chest Port 1 View  09/24/2013   CLINICAL DATA:  Shortness of breath.  EXAM: PORTABLE CHEST - 1 VIEW  COMPARISON:  01/10/2012.  FINDINGS: There is chronic cardiomegaly. Aortic atherosclerosis. No acute aortic contour abnormality.  There is diffuse interstitial coarsening which is increased from previous. Chronic patchy opacities in the bilateral lungs, most confluent in the right upper and right lower chest. Based on chest CT 01/23/2009, these are likely postinfectious scarring. No definitive effusion. No pneumothorax.  IMPRESSION: When accounting for chronic lung scarring (likely postinfectious), no definite edema or pneumonia.   Electronically Signed   By: Jorje Guild M.D.   On: 09/24/2013 02:11   Dg Swallowing Func-speech Pathology  10/16/2013   Assunta Curtis, CCC-SLP     10/16/2013 12:17 PM Objective Swallowing Evaluation: Modified Barium Swallowing Study   Patient Details  Name: Julie Haas MRN: [id number] Date of Birth: 1925/11/13  Today's Date: 10/16/2013 Time: 1145-1200 SLP Time Calculation (min): 15 min  Past Medical History:  Past Medical History  Diagnosis Date  . Hyperlipidemia   . Arrhythmia     PVC  . Retinal detachment   . Renal disorder   . Allergy   . Occlusion and stenosis of carotid artery without mention of  cerebral infarction 03/24/2012  . Pneumonia, organism unspecified 11/192013  . Depressive disorder, not elsewhere classified 03/05/2011  . Other premature beats 03/05/2011  . Other seborrheic keratosis 11/20/2010  . Syncope and collapse 10/02/2010  . Palpitations 11/21/2009  . Unspecified essential hypertension 06/27/2009  . Cardiomegaly 06/27/2009  . Other generalized ischemic  cerebrovascular disease 06/27/2009  . Dizziness and giddiness 06/27/2009  . Unspecified vitamin D deficiency 05/16/2009  . Family history of sudden cardiac death (SCD) Dec 19, 2008  . Rash and other nonspecific skin eruption 07/05/2008  . Edema 03/15/2008  . Obesity, unspecified 07/25/2006  .  Varicose veins of lower extremities with inflammation 03/16/2003   . Shortness of breath    Past Surgical History:  Past Surgical History  Procedure Laterality Date  . Cholecystectomy  1983  . Cataract extraction Bilateral     Dr. [name]  . Tonsillectomy    . Keratosis    . Retinal detachment surgery Left   . Cardioversion N/A 10/01/2013    Procedure: CARDIOVERSION;  Surgeon: Sanda Klein, MD;   Location: [facility] ENDOSCOPY;  Service: Cardiovascular;  Laterality:  N/A;  . Tee without cardioversion N/A 10/01/2013    Procedure: TRANSESOPHAGEAL ECHOCARDIOGRAM (TEE);  Surgeon:  Sanda Klein, MD;  Location: [facility] ENDOSCOPY;  Service:  Cardiovascular;  Laterality: N/A;   HPI:  80yo female with dx of acute respiratory failure in the setting  of bilateral R>L PNA.  Hx HTN, AFib, CHF, recent admit for PNA  (d/c 8/15). Returned 8/24 with worsening SOB, cough, chills. CXR  revealed multifocal PNA and pt admitted by [facility]. Course has been  c/b AFib with RVR and acute on chronic dCHF. Respiratory status  worse overnight requiring NRB; now back on [place].  Swallow eval  ordered to r/o aspiration.       Assessment / Plan / Recommendation Clinical Impression  Dysphagia Diagnosis: Within Functional Limits Clinical impression: Pt presents with quite functional swallow  with adequate mastication, age-related swallow delays, mild  vallecular residue of solids, and consistent/reliable laryngeal  closure with all tested consistencies.  No penetration/aspiration  observed.  Esophageal sweep revealed barium stasis of solid  materials; liquid wash facilitated clearance.  Recommend  continued regular consistency diet, thin liquids - no SLP f/u  warranted.             Diet  Recommendation Regular;Thin liquid   Liquid Administration via: Cup;Straw Medication Administration: Whole meds with liquid Supervision: Patient able to self feed Compensations: Follow solids with liquid    Other  Recommendations Oral Care Recommendations: Oral care BID   Follow Up Recommendations  None           SLP Swallow Goals  n/a   General Date of Onset: 10/11/13 HPI: 80yo female with dx of acute respiratory failure in the  setting of bilateral R>L PNA.  Hx HTN, AFib, CHF, recent admit  for PNA (d/c 8/15). Returned 8/24 with worsening SOB, cough,  chills. CXR revealed multifocal PNA and pt admitted by [facility].  Course has been c/b AFib with RVR and acute on chronic dCHF.  Respiratory status worse overnight requiring NRB; now back on [place].   Swallow eval ordered to r/o aspiration.   Type of Study: Modified Barium Swallowing Study Reason for Referral: Objectively evaluate swallowing function Previous Swallow Assessment: bedside eval 10/15/13 Diet Prior to this Study: Regular;Thin liquids Temperature Spikes Noted: No Respiratory Status: Nasal cannula History of Recent Intubation: No Behavior/Cognition: Alert;Cooperative;Pleasant mood Oral Cavity - Dentition: Adequate natural dentition Oral Motor / Sensory Function: Within functional limits Self-Feeding Abilities: Able to feed self Patient Positioning: Upright in chair Baseline Vocal Quality: Breathy;Hoarse Volitional Cough: Strong Volitional Swallow: Able to elicit Anatomy: Within functional limits Pharyngeal Secretions: Not observed secondary MBS    Reason for Referral Objectively evaluate swallowing function   Oral Phase Oral Preparation/Oral Phase Oral Phase: WFL   Pharyngeal Phase Pharyngeal Phase Pharyngeal Phase: Impaired Pharyngeal - Thin Pharyngeal - Thin Cup: Delayed swallow initiation Pharyngeal - Solids Pharyngeal - Puree: Delayed swallow initiation;Pharyngeal residue  - valleculae  Cervical Esophageal Phase    GO   Amanda L. Couture,  MA CCC/SLP Pager  [phone]  Cervical Esophageal Phase Cervical Esophageal Phase: Impaired Cervical Esophageal Phase - Solids Puree: Other (Comment) (barium stasis; clears with liquid wash)         [name] 10/16/2013, 12:16 PM       ASSESSMENT AND PLAN: 1. Atrial fibrillation with RVR: Being driven by underlying pulmonary processes. Metoprolol increased to 37.5 mg bid by PTH. No evidence of bronchospasms. Normal LV systolic function with moderate left atrial enlargement by echo earlier this month. HR better but not optimally controlled. I will increase the frequency of short-acting diltiazem to 30 mg qid and titrate as BP tolerates.Continue Eliquis. Amiodarone has since been discontinued.   2. Acute diastolic heart failure: Likely secondary to rapid atrial fibrillation, which is being driven by underlying pulmonary processes. Continue oral Lasix. I think her primary pulmonary problems relate to pneumonia rather than heart failure at the present time.  3. Acute hypoxic respiratory failure: Secondary to HCAP and acute DCHF. Radiographic worsening of b/l pneumonia, but nurse has been able to turn down oxygen to 2L with 99% sats. Wheezes markedly diminished with steroids. Currently on broad-spectrum antimicrobial therapy with azithromycin, aztreonam, and vancomycin, along with IV steroids and nebulizer/inhaler therapy.   4. Dysphagia: Modified barium swallow study showed no evidence for aspiration, with age-related delays noted.  Time spent: 35 minutes, >50% counseling family member about patient's clinical situation.  Kate Sable, M.D., F.A.C.C.

## 2013-10-17 NOTE — Progress Notes (Signed)
Patient ID: Julie Haas, female   DOB: [birth year], 80 y.o.   MRN: [id number]  [facility] PROGRESS NOTE  Julie Haas DOB: 01/19/1926 DOA: 10/11/2013 PCP: Estill Dooms, MD  Chart reviewed. Events of the night noted.  Brief narrative: 80 y.o. female with HTN, atrial fibrillation on AC, recent admission for PNA s/p TEE/DCCV (discharged on 8/15) and presented to [facility] ED with main concern of progressively worsening shortness of breath, mostly exertional but occasionally present at rest. This as need associated with productive cough of yellow sputum, subjective fevers, chills, LE edema and 2 pillow orthopnea.   Assessment and Plan:      Acute hypoxic respiratory failure  - secondary to HCAP/CHF Improving. Requiring less oxygen. MBS negative for aspiration -  recommendation to have CT chest repeated in 6-8 weeks to re-evaluate the areas of nodularity     HCAP (healthcare-associated pneumonia) Continue current    HTN (hypertension) controlled    Atrial fibrillation with RVR Rate better    Acute on chronic diastolic heart failure Continue current    Chronic left leg stasis ulcer  - continue wound care      Moderate malnutrition - advance diet as pt able to tolerate     Hypokalemia repleted  Code Status: DNR Family Communication: daughter 8/28 Disposition Plan: not stable for discharge  IV Access:   Peripheral IV Procedures and diagnostic studies:   Ct Chest Wo Contrast   10/11/2013  Patchy bilateral airspace process with nodularity most prominent over the right upper lobe likely due to infection. Patchy bronchiectatic change as described. Small bilateral pleural effusions. 1.3 cm pretracheal lymph node likely reactive. Recommend followup CT in 6-8 weeks to re-evaluate the areas of nodularity.  Cardiomegaly with calcification of the mitral valve annulus and left atrial enlargement. Dg Chest Portable 1 View  10/11/2013   Cardiomegaly with chronic  interstitial thickening. No overt congestive failure.  Multi focal pulmonary opacities. Primarily felt to represent areas of post infectious scarring. Cannot exclude progressive left lower lobe infection or aspiration.  Medical Consultants:   Cardiology  Other Consultants:   Physical therapy  Anti-Infectives:   Vancomycin 8/24 --> Aztreonam 8/24 --> azithro 8/28 -->   LOS: 6 days   HPI/Subjective: "i don't pay attention to my breathing".  Objective: Filed Vitals:   10/16/13 2139 10/17/13 0400 10/17/13 1040 10/17/13 1400  BP: 127/75 129/83  103/57  Pulse: 126 98  92  Temp: 98.4 F (36.9 C) 97.9 F (36.6 C)  98.1 F (36.7 C)  TempSrc: Oral Oral  Oral  Resp: _0 Height:      Weight:  93.3 kg (205 lb 11 oz)    SpO2: 93% 93% 96% 92%    Intake/Output Summary (Last 24 hours) at 10/17/13 1652 Last data filed at 10/17/13 1300  Gross per 24 hour  Intake    603 ml  Output   1301 ml  Net   -698 ml   tele: atrial fib. Rate 90s  Exam:   General:  In chair eating supper Cardiovascular: irreg irreg.  MGR  Respiratory: breathing nonlabored. Rales at bases  Abdomen: Soft, non tender, non distended, bowel sounds present, no guarding  Ext: warm. Trace edema  Data Reviewed: Basic Metabolic Panel:  Recent Labs Lab 10/11/13 1243 10/12/13 0404 10/13/13 0515 10/15/13 0534  NA 133* 137 136* 135*  K 4.3 3.5* 3.4* 4.5  CL 94* 95* 96 95*  CO2 _1 GLUCOSE  104* 92 88 153*  BUN _0 CREATININE 0.64 0.60 0.52 0.57  CALCIUM 8.4 7.9* 8.0* 8.3*  MG  --  1.7  --   --    CBC:  Recent Labs Lab 10/11/13 1243 10/12/13 0404 10/13/13 0515 10/15/13 0534  WBC 7.8 6.7 6.8 9.6  NEUTROABS 5.5  --   --   --   HGB 11.8* 11.1* 10.9* 12.4  HCT 36.2 33.1* 32.7* 38.3  MCV 92.8 93.2 92.6 96.7  PLT 337 281 293 376   Cardiac Enzymes:  Recent Labs Lab 10/11/13 1943 10/12/13 0126 10/12/13 0940  TROPONINI <0.30 <0.30 <0.30   Recent Results (from the past 240  hour(s))  URINE CULTURE     Status: None   Collection Time    10/11/13  1:25 PM      Result Value Ref Range Status   Specimen Description URINE, CLEAN CATCH   Final   Special Requests NONE   Final   Culture  Setup Time     Final   Value: 10/11/2013 20:09     Performed at [place]     Final   Value: 70,000 COLONIES/ML     Performed at [facility]   Culture     Final   Value: STAPHYLOCOCCUS SPECIES (COAGULASE NEGATIVE)     Note: RIFAMPIN AND GENTAMICIN SHOULD NOT BE USED AS SINGLE DRUGS FOR TREATMENT OF STAPH INFECTIONS.     Performed at [facility]   Report Status 10/15/2013 FINAL   Final   Organism ID, Bacteria STAPHYLOCOCCUS SPECIES (COAGULASE NEGATIVE)   Final  CULTURE, BLOOD (ROUTINE X 2)     Status: None   Collection Time    10/11/13  7:33 PM      Result Value Ref Range Status   Specimen Description BLOOD RIGHT ARM   Final   Special Requests BOTTLES DRAWN AEROBIC AND ANAEROBIC 10CC   Final   Culture  Setup Time     Final   Value: 10/12/2013 04:12     Performed at [facility]   Culture     Final   Value:        BLOOD CULTURE RECEIVED NO GROWTH TO DATE CULTURE WILL BE HELD FOR 5 DAYS BEFORE ISSUING A FINAL NEGATIVE REPORT     Performed at [facility]   Report Status PENDING   Incomplete  CULTURE, BLOOD (ROUTINE X 2)     Status: None   Collection Time    10/11/13  7:43 PM      Result Value Ref Range Status   Specimen Description BLOOD LEFT ARM   Final   Special Requests BOTTLES DRAWN AEROBIC AND ANAEROBIC 5CC   Final   Culture  Setup Time     Final   Value: 10/12/2013 04:13     Performed at [facility]   Culture     Final   Value:        BLOOD CULTURE RECEIVED NO GROWTH TO DATE CULTURE WILL BE HELD FOR 5 DAYS BEFORE ISSUING A FINAL NEGATIVE REPORT     Performed at [facility]   Report Status PENDING   Incomplete  RESPIRATORY VIRUS PANEL     Status: None   Collection Time    10/14/13  4:50  PM      Result Value Ref Range Status   Source - RVPAN NASAL SWAB   Corrected   Comment: CORRECTED ON  08/28 AT 2020: PREVIOUSLY REPORTED AS NASAL SWAB   Respiratory Syncytial Virus A NOT DETECTED   Final   Respiratory Syncytial Virus B NOT DETECTED   Final   Influenza A NOT DETECTED   Final   Influenza B NOT DETECTED   Final   Parainfluenza 1 NOT DETECTED   Final   Parainfluenza 2 NOT DETECTED   Final   Parainfluenza 3 NOT DETECTED   Final   Metapneumovirus NOT DETECTED   Final   Rhinovirus NOT DETECTED   Final   Adenovirus NOT DETECTED   Final   Influenza A H1 NOT DETECTED   Final   Influenza A H3 NOT DETECTED   Final   Comment: (NOTE)           Normal Reference Range for each Analyte: NOT DETECTED     Testing performed using the Luminex xTAG Respiratory Viral Panel test     kit.     The analytical performance characteristics of this assay have been     determined by [facility].  The modifications have not been     cleared or approved by the FDA. This assay has been validated pursuant     to the CLIA regulations and is used for clinical purposes.     Performed at [facility]     Scheduled Meds: . apixaban  5 mg Oral BID  . aztreonam  1 g Intravenous Q8H  . furosemide  40 mg Intravenous BID  . hydrocerin   Topical Daily  . losartan  50 mg Oral Daily  . metoprolol  50 mg Oral BID  . potassium chloride SA  20 mEq Oral Daily  . vancomycin  750 mg Intravenous Q12H  . vitamin E  1,000 Units Oral Daily   Continuous Infusions:    Delfina Redwood, MD [facility] Pager [phone]  If 7PM-7AM, please contact night-coverage [email or web address] Password [email or web address] 10/15/2013, 8:16 AM

## 2013-10-18 DIAGNOSIS — J9600 Acute respiratory failure, unspecified whether with hypoxia or hypercapnia: Secondary | ICD-10-CM

## 2013-10-18 DIAGNOSIS — J9601 Acute respiratory failure with hypoxia: Secondary | ICD-10-CM

## 2013-10-18 DIAGNOSIS — S279XXS Injury of unspecified intrathoracic organ, sequela: Secondary | ICD-10-CM

## 2013-10-18 DIAGNOSIS — S27309A Unspecified injury of lung, unspecified, initial encounter: Secondary | ICD-10-CM

## 2013-10-18 LAB — BASIC METABOLIC PANEL
Anion gap: 11 (ref 5–15)
Anion gap: 14 (ref 5–15)
BUN: 30 mg/dL — AB (ref 6–23)
BUN: 28 mg/dL — AB (ref 6–23)
CALCIUM: 8.7 mg/dL (ref 8.4–10.5)
CHLORIDE: 90 meq/L — AB (ref 96–112)
CO2: 27 mEq/L (ref 19–32)
CO2: 23 mEq/L (ref 19–32)
CREATININE: 0.66 mg/dL (ref 0.50–1.10)
Calcium: 8.8 mg/dL (ref 8.4–10.5)
Chloride: 92 mEq/L — ABNORMAL LOW (ref 96–112)
Creatinine, Ser: 0.55 mg/dL (ref 0.50–1.10)
GFR calc non Af Amer: 78 mL/min — ABNORMAL LOW (ref >90)
GFR, EST NON AFRICAN AMERICAN: 83 mL/min — AB (ref >90)
GLUCOSE: 119 mg/dL — AB (ref 70–99)
Glucose, Bld: 124 mg/dL — ABNORMAL HIGH (ref 70–99)
Potassium: 6.2 mEq/L — ABNORMAL HIGH (ref 3.7–5.3)
Potassium: 5.3 mEq/L (ref 3.7–5.3)
SODIUM: 129 meq/L — AB (ref 137–147)
Sodium: 128 mEq/L — ABNORMAL LOW (ref 137–147)

## 2013-10-18 LAB — CULTURE, BLOOD (ROUTINE X 2)
Culture: NO GROWTH
Culture: NO GROWTH

## 2013-10-18 LAB — CBC
HCT: 35.1 % — ABNORMAL LOW (ref 36.0–46.0)
HEMOGLOBIN: 11.8 g/dL — AB (ref 12.0–15.0)
MCH: 32 pg (ref 26.0–34.0)
MCHC: 33.6 g/dL (ref 30.0–36.0)
MCV: 95.1 fL (ref 78.0–100.0)
Platelets: 433 10*3/uL — ABNORMAL HIGH (ref 150–400)
RBC: 3.69 MIL/uL — ABNORMAL LOW (ref 3.87–5.11)
RDW: 14.1 % (ref 11.5–15.5)
WBC: 17.1 10*3/uL — ABNORMAL HIGH (ref 4.0–10.5)

## 2013-10-18 MED ORDER — DILTIAZEM HCL 60 MG PO TABS
60.0000 mg | ORAL_TABLET | Freq: Four times a day (QID) | ORAL | Status: DC
  Administered 2013-10-18 – 2013-10-20 (×8): 60 mg via ORAL
  Filled 2013-10-18 (×12): qty 1

## 2013-10-18 MED ORDER — FUROSEMIDE 10 MG/ML IJ SOLN
40.0000 mg | Freq: Every day | INTRAMUSCULAR | Status: DC
  Administered 2013-10-18: 40 mg via INTRAVENOUS
  Filled 2013-10-18 (×2): qty 4

## 2013-10-18 NOTE — Progress Notes (Signed)
Physical Therapy Treatment Patient Details Name: Julie Haas MRN: [id number] DOB: Jun 29, 1925 Today's Date: 11/16/13    History of Present Illness pt presents with HCAP and recent admit for A-fib with RVR.      PT Comments    Pt progressing well with mobility and able to titrate oxygen off during activity. Pt with sats 97% on 2L at rest, HR 68-100 during activity, sats 92% on 1L and RA. HR 80 end of session. End of session pt sats 92% on RA with RN aware and going to spot check pt. Pt encouraged to continue HEP and ambulation throughout the day.    Follow Up Recommendations  Home health PT     Equipment Recommendations       Recommendations for Other Services       Precautions / Restrictions Precautions Precautions: Fall    Mobility  Bed Mobility               General bed mobility comments: in recliner on arrival  Transfers Overall transfer level: Modified independent   Transfers: Sit to/from Stand           General transfer comment: able to transfer without assist, cues to scoot back in chair  Ambulation/Gait Ambulation/Gait assistance: Supervision Ambulation Distance (Feet): 400 Feet Assistive device: Rolling walker (2 wheeled) Gait Pattern/deviations: Step-through pattern;Decreased stride length;Trunk flexed   Gait velocity interpretation: Below normal speed for age/gender General Gait Details: cues for posture, position in RW    Stairs            Wheelchair Mobility    Modified Rankin (Stroke Patients Only)       Balance                                    Cognition Arousal/Alertness: Awake/alert Behavior During Therapy: WFL for tasks assessed/performed Overall Cognitive Status: Within Functional Limits for tasks assessed                      Exercises General Exercises - Lower Extremity Long Arc Quad: AROM;Both;15 reps;Seated Hip Flexion/Marching: AROM;Both;15 reps;Seated    General Comments         Pertinent Vitals/Pain Pain Assessment: No/denies pain    Home Living                      Prior Function            PT Goals (current goals can now be found in the care plan section) Progress towards PT goals: Progressing toward goals    Frequency       PT Plan Discharge plan needs to be updated    Co-evaluation             End of Session Equipment Utilized During Treatment: Oxygen Activity Tolerance: Patient tolerated treatment well Patient left: in chair;with call bell/phone within reach     Time: 3570-1779 PT Time Calculation (min): 25 min  Charges:  $Gait Training: 8-22 mins $Therapeutic Exercise: 8-22 mins                    G Codes:      Melford Aase Nov 16, 2013, 2:25 PM Elwyn Reach, [place]

## 2013-10-18 NOTE — Clinical Social Work Note (Signed)
Clinical Social Worker continuing to follow patient and family for support and discharge planning needs.  Patient is from [place], however will return to [facility] SN[facility] at discharge.  CSW spoke with facility who states that patient continues to have a bed available.  CSW remains available for support and to facilitate patient discharge needs once medically ready.  Barbette Or, [place]

## 2013-10-18 NOTE — Progress Notes (Signed)
Name: Julie Haas MRN: [id number] DOB: 06/28/1925    ADMISSION DATE:  10/11/2013 CONSULTATION DATE:  8/27  REFERRING MD :  Algis Liming  PRIMARY SERVICE:  [facility]   CHIEF COMPLAINT:  HCAP  BRIEF PATIENT DESCRIPTION:  80yo female with hx HTN, AFib, CHF, recent admit for PNA (d/c 8/15).  Returned 8/24 with worsening SOB, cough, chills.  CXR revealed multifocal PNA and pt admitted by [facility].  Course has been c/b AFib with RVR and acute on chronic dCHF.  PCCM consulted for assist with HCAP.   SIGNIFICANT EVENTS / STUDIES:  CT chest 8/24>>>patchy bilat asd with nodularity most prominent RUL, small bilat effusions.   LINES / TUBES: none  CULTURES: Urine 8/24>>>neg BCx2 8/24>>>staph epi resp viral panel 8/27>>>neg u strep 8/28>>>neg u legionella 8/28>>>neg  ANTIBIOTICS: Vanc >>> Aztreonam 8/24>>> azithro 8/28>>>  EVENTS 8/28. Required increased O2. Steroid trial initiated. DNR re-validated.     SUBJECTIVE:  Improved.  On 2L [place].   Neg 5L since admission.  Wants to go home.     VITAL SIGNS: Temp:  [98.1 F (36.7 C)-98.5 F (36.9 C)] 98.1 F (36.7 C) (08/31 0621) Pulse Rate:  [91-94] 94 (08/31 0621) Resp:  [18-20] 20 (08/31 0621) BP: (103-146)/(57-92) 146/92 mmHg (08/31 0621) SpO2:  [90 %-92 %] 92 % (08/31 0621) Weight:  [207 lb 14.3 oz (94.3 kg)] 207 lb 14.3 oz (94.3 kg) (08/31 0621) 4 liters   Intake/Output Summary (Last 24 hours) at 10/18/13 1125 Last data filed at 10/18/13 0800  Gross per 24 hour  Intake    533 ml  Output    801 ml  Net   -268 ml   PHYSICAL EXAMINATION: General:  Chronically ill appearing female, NAD  Neuro:  Awake, alert,  HEENT:  Mm moist, no JVD  Cardiovascular:  s1s2 irreg, tachy Lungs: resps even non labored, few scattered crackles Abdomen:  Soft, +bs Musculoskeletal:  Warm and dry, no sig edema     Recent Labs Lab 10/13/13 0515 10/15/13 0534 10/18/13 0425  NA 136* 135* 128*  K 3.4* 4.5 6.2*  CL 96 95* 90*  CO2 30 27 27     BUN 11 12 30*  CREATININE 0.52 0.57 0.66  GLUCOSE 88 153* 124*    Recent Labs Lab 10/13/13 0515 10/15/13 0534 10/18/13 0425  HGB 10.9* 12.4 11.8*  HCT 32.7* 38.3 35.1*  WBC 6.8 9.6 17.1*  PLT 293 376 433*   No new CXR 8/31  ASSESSMENT / PLAN: Acute respiratory failure in the setting of bilateral R>L pulmonary infiltrates. D dx include HCAP w/ ARDS, viral pneumonitis, pulmonary edema, cryptogenic organizing PNA.   Clinically better on steroids.  Amiodarone stopped 8/28    PLAN -  -Cont abx as above  -continue diuresis as below  -pulm hygiene  -intermittent f/u CXR  -supplemental O2 -will need f/u CT chest 6-8 weeks - cont empiric steroid trial Pt is established DNR -continue to hold amiodarone   AFib with RVR - s/p TEE and failed cardioversion last admit Acute on chronic dCHF Plan -  -Cards following  -Cont lopressor, eliquis  -Cont diuresis per cards as BP, Scr tol  -may need to resume amiodarone once resp status improved (likely ok as she continues to improve and amiodarone was just stopped 8/28 - has long half life so suspect holding it has not made much clinical impact as of yet)    Nickolas Madrid, NP 10/18/2013  11:25 AM Pager: [phone] or [phone]   STAFF  NOTE  - She is better. Suspect Acute Lung INjury -due to HCAP +/- acute amio toxicity. - Possible BOOP as pathology Responding to steroids. Wanting to go home. Likely dc  In 1-2 days all else being as equal. DR Chase Caller will followup as opd. Rest per NP  Dr. Brand Males, M.D., [facility].C.P Pulmonary and Critical Care Medicine Staff Physician [facility] Pager: [phone], If no answer or between  15:00h - 7:00h: call [phone]  10/18/2013 12:00 PM

## 2013-10-18 NOTE — Progress Notes (Signed)
Patient ID: Julie Haas, female   DOB: 24-Dec-1925, 80 y.o.   MRN: [id number]       SUBJECTIVE: Pt denies dyspnea or chest pain.    Intake/Output Summary (Last 24 hours) at 10/18/13 0818 Last data filed at 10/18/13 0100  Gross per 24 hour  Intake    833 ml  Output   1101 ml  Net   -268 ml    Current Facility-Administered Medications  Medication Dose Route Frequency Provider Last Rate Last Dose  . acetaminophen (TYLENOL) tablet 650 mg  650 mg Oral Q6H PRN Kinnie Feil, MD   650 mg at 10/16/13 2215   Or  . acetaminophen (TYLENOL) suppository 650 mg  650 mg Rectal Q6H PRN Kinnie Feil, MD      . apixaban (ELIQUIS) tablet 5 mg  5 mg Oral BID Kinnie Feil, MD   5 mg at 10/17/13 2112  . azithromycin (ZITHROMAX) 500 mg in dextrose 5 % 250 mL IVPB  500 mg Intravenous Q24H Delfina Redwood, MD   500 mg at 10/17/13 1030  . aztreonam (AZACTAM) 1 g in dextrose 5 % 50 mL IVPB  1 g Intravenous Q8H Jessica U Vann, DO   1 g at 10/18/13 0100  . diltiazem (CARDIZEM) tablet 30 mg  30 mg Oral 4 times per day Herminio Commons, MD   30 mg at 10/18/13 0532  . docusate sodium (COLACE) capsule 100 mg  100 mg Oral BID Delfina Redwood, MD   100 mg at 10/17/13 2112  . furosemide (LASIX) tablet 20 mg  20 mg Oral Daily Delfina Redwood, MD   20 mg at 10/17/13 1023  . hydrocerin (EUCERIN) cream   Topical Daily Theodis Blaze, MD   1 application at 01/60/10 1039  . levalbuterol (XOPENEX) nebulizer solution 0.63 mg  0.63 mg Nebulization Q4H PRN Theodis Blaze, MD   0.63 mg at 10/16/13 2215  . magnesium hydroxide (MILK OF MAGNESIA) suspension 30 mL  30 mL Oral Daily PRN Delfina Redwood, MD      . methylPREDNISolone sodium succinate (SOLU-MEDROL) 40 mg/mL injection 40 mg  40 mg Intravenous Q12H Erick Colace, NP   40 mg at 10/17/13 2238  . metoprolol tartrate (LOPRESSOR) tablet 37.5 mg  37.5 mg Oral BID Delfina Redwood, MD   37.5 mg at 10/17/13 2112  . ondansetron (ZOFRAN) tablet 4 mg   4 mg Oral Q6H PRN Kinnie Feil, MD       Or  . ondansetron (ZOFRAN) injection 4 mg  4 mg Intravenous Q6H PRN Kinnie Feil, MD      . sodium chloride 0.9 % injection 3 mL  3 mL Intravenous Q12H Kinnie Feil, MD   3 mL at 10/17/13 2108  . vancomycin (VANCOCIN) IVPB 1000 mg/200 mL premix  1,000 mg Intravenous Q12H Delfina Redwood, MD   1,000 mg at 10/17/13 2105  . vitamin E capsule 1,000 Units  1,000 Units Oral Daily Kinnie Feil, MD   1,000 Units at 10/17/13 1022    Filed Vitals:   10/17/13 1400 10/17/13 1655 10/17/13 2012 10/18/13 0621  BP: 103/57  120/69 146/92  Pulse: 92  91 94  Temp: 98.1 F (36.7 C)  98.5 F (36.9 C) 98.1 F (36.7 C)  TempSrc: Oral  Oral Oral  Resp: 18  18 20   Height:      Weight:    207 lb 14.3 oz (94.3 kg)  SpO2:  92% 90% 91% 92%    PHYSICAL EXAM General: NAD, using oxygen by nasal cannula  HEENT: Normal.  Neck: supple Lungs: Minimal basilar crackles CV: irregular and tachycardic; Chronic venous stasis and ecchymoses in left leg.  Abdomen: Soft, nontender, obese.  Neurologic: Alert and oriented x 3.  Psych: Normal affect.  Extremities: 1-2+   TELEMETRY: Reviewed telemetry pt in atrial fibrillation with PVC's.  LABS: Basic Metabolic Panel:  Recent Labs  10/18/13 0425  NA 128*  K 6.2*  CL 90*  CO2 27  GLUCOSE 124*  BUN 30*  CREATININE 0.66  CALCIUM 8.8   CBC:  Recent Labs  10/18/13 0425  WBC 17.1*  HGB 11.8*  HCT 35.1*  MCV 95.1  PLT 433*    RADIOLOGY: Dg Chest 2 View  10/17/2013   CLINICAL DATA:  80 year old female shortness of Breath. Pneumonia. atrial fibrillation Initial encounter.  EXAM: CHEST  2 VIEW  COMPARISON:  10/15/2013 and earlier.  FINDINGS: Portable AP semi upright view at 0711 hrs. Interval larger and more confluent bilateral nodular pulmonary opacities. As before, the left base is most heavily affected. Mild veiling opacity at both lung bases suggesting small effusions. Stable cardiac size and  mediastinal contours. Visualized tracheal air column is within normal limits.  IMPRESSION: Radiographic progression of bilateral pneumonia, with larger more confluent bilateral opacities. Small effusions.   Electronically Signed   By: Lars Pinks M.D.   On: 10/17/2013 07:46   Dg Chest 2 View  09/30/2013   EXAM: CHEST  2 VIEW  COMPARISON:  None.  FINDINGS: The heart size and mediastinal contours are within normal limits. Both lungs are clear. The visualized skeletal structures are unremarkable.  IMPRESSION: No active cardiopulmonary disease.   Electronically Signed   By: Marin Olp M.D.   On: 09/30/2013 08:04   Dg Chest 2 View  09/28/2013   CLINICAL DATA:  Cough  EXAM: CHEST  2 VIEW  COMPARISON:  09/27/2013  FINDINGS: Cardiac shadow is stable. Patchy infiltrates are again noted bilaterally. Increasing small left effusion is seen. No new focal abnormality is noted.  IMPRESSION: Patchy bilateral infiltrates.   Electronically Signed   By: Inez Catalina M.D.   On: 09/28/2013 11:09   Ct Chest Wo Contrast  10/11/2013   CLINICAL DATA:  Recurrent pneumonia.  EXAM: CT CHEST WITHOUT CONTRAST  TECHNIQUE: Multidetector CT imaging of the chest was performed following the standard protocol without IV contrast.  COMPARISON:  CT 01/23/2009 and series if recent chest radiographs August 2015.  FINDINGS: Lungs are adequately inflated demonstrate a very small amount of bilateral pleural fluid. There is a patchy bilateral airspace process with some nodularity worse over the right upper lobe. Minimal patchy bronchiectatic change over the lingula, posterior right upper lobe and left apex.  Mild cardiomegaly with a very small amount of pericardial fluid. There is moderate calcific plaque involving the left main, left and descending and lateral circumflex coronary arteries. Calcification over the mitral valve annulus with mild left atrial enlargement mild prominence of the main pulmonary arteries. There is a 1.3 cm pretracheal lymph  node slightly larger. There is calcified plaque involving the thoracic aorta. Remaining mediastinal structures are unremarkable.  Images through the upper abdomen demonstrated 1.1 cm hypodensity over the left dome of the liver unchanged likely a cyst. Evidence of a prior cholecystectomy. Remainder of the exam is unchanged  IMPRESSION: Patchy bilateral airspace process with nodularity most prominent over the right upper lobe likely due to infection. Patchy bronchiectatic change  as described. Small bilateral pleural effusions. 1.3 cm pretracheal lymph node likely reactive. Recommend followup CT in 6-8 weeks to re-evaluate the areas of nodularity.  Cardiomegaly with calcification of the mitral valve annulus and left atrial enlargement. Atherosclerotic coronary artery disease. Small amount of pericardial fluid.  1.1 cm liver cyst unchanged.   Electronically Signed   By: Marin Olp M.D.   On: 10/11/2013 17:34   Dg Chest Port 1 View  10/15/2013   CLINICAL DATA:  Increased shortness of breath and cough.  EXAM: PORTABLE CHEST - 1 VIEW  COMPARISON:  10/11/2013  FINDINGS: Cardiac enlargement. Diffuse interstitial and alveolar parenchymal infiltrates, increasing since previous study. Changes may represent diffuse pneumonia or edema. Nodular infiltrates again demonstrated in the right upper lung. Blunting of the right costophrenic angle suggesting a developing right pleural effusion.  IMPRESSION: Increasing diffuse airspace and interstitial infiltrates in the lungs. Developing small right pleural effusion.   Electronically Signed   By: Lucienne Capers M.D.   On: 10/15/2013 01:21   Dg Chest Portable 1 View  10/11/2013   CLINICAL DATA:  Atrial fibrillation and tachycardia.  EXAM: PORTABLE CHEST - 1 VIEW  COMPARISON:  Chest radiograph of 09/30/2013.  CT of 01/23/2009  FINDINGS: Midline trachea. Moderate cardiomegaly. Chronic right-sided pleural thickening blunts the costophrenic angle. Possible small left pleural  effusion. No pneumothorax. Interstitial thickening is chronic. Patchy right upper lobe peripheral opacity has been present back to 01/10/2012. There is also chronic right base pulmonary opacity. Left lower lobe pulmonary opacity may be progressive since 09/30/2013. Grossly similar to 09/28/2013.  IMPRESSION: Cardiomegaly with chronic interstitial thickening. No overt congestive failure.  Multi focal pulmonary opacities. Primarily felt to represent areas of post infectious scarring. Cannot exclude progressive left lower lobe infection or aspiration.  Given chronicity, CT may be informative to re-evaluate the pulmonary opacities.   Electronically Signed   By: Abigail Miyamoto M.D.   On: 10/11/2013 13:33   Dg Chest Port 1 View  09/27/2013   CLINICAL DATA:  Shortness of breath.  EXAM: PORTABLE CHEST - 1 VIEW  COMPARISON:  Chest radiograph performed 09/26/2013  FINDINGS: The lungs are well-aerated. Vascular congestion is noted, with diffuse bilateral airspace opacities, possibly reflecting multifocal pneumonia or pulmonary edema. Small bilateral pleural effusions are seen. No pneumothorax is identified.  The cardiomediastinal silhouette is mildly enlarged. No acute osseous abnormalities are seen.  IMPRESSION: Vascular congestion and mild cardiomegaly, with diffuse bilateral airspace opacities, possibly reflecting multifocal pneumonia or pulmonary edema. Small bilateral pleural effusions seen.   Electronically Signed   By: Garald Balding M.D.   On: 09/27/2013 04:55   Dg Chest Port 1 View  09/26/2013   CLINICAL DATA:  Dyspnea.  Hypoxia.  EXAM: PORTABLE CHEST - 1 VIEW  COMPARISON:  Chest x-ray 09/24/2013.  FINDINGS: Patchy areas of interstitial prominence and multifocal airspace disease are seen scattered throughout the lungs bilaterally (right greater than left). Small bilateral pleural effusions. There does not appear to be cephalization of the pulmonary vasculature. Heart size appears borderline enlarged, accentuated by  low lung volumes and lordotic positioning on this portable radiograph. Upper mediastinal contours are within normal limits allowing for these technique related limitations. Atherosclerosis in the thoracic aorta.  IMPRESSION: 1. Multifocal patchy asymmetrically distributed interstitial and airspace disease throughout the lungs bilaterally (right greater than left), concerning for multilobar pneumonia. 2. Small bilateral pleural effusions. 3. Borderline cardiomegaly. 4. Atherosclerosis.   Electronically Signed   By: Vinnie Langton M.D.   On: 09/26/2013 17:06  Dg Chest Port 1 View  09/24/2013   CLINICAL DATA:  Shortness of breath.  EXAM: PORTABLE CHEST - 1 VIEW  COMPARISON:  01/10/2012.  FINDINGS: There is chronic cardiomegaly. Aortic atherosclerosis. No acute aortic contour abnormality.  There is diffuse interstitial coarsening which is increased from previous. Chronic patchy opacities in the bilateral lungs, most confluent in the right upper and right lower chest. Based on chest CT 01/23/2009, these are likely postinfectious scarring. No definitive effusion. No pneumothorax.  IMPRESSION: When accounting for chronic lung scarring (likely postinfectious), no definite edema or pneumonia.   Electronically Signed   By: Jorje Guild M.D.   On: 09/24/2013 02:11   Dg Swallowing Func-speech Pathology  10/16/2013   Assunta Curtis, CCC-SLP     10/16/2013 12:17 PM Objective Swallowing Evaluation: Modified Barium Swallowing Study   Patient Details  Name: Julie Haas MRN: [id number] Date of Birth: Jan 30, 1926  Today's Date: 10/16/2013 Time: 1145-1200 SLP Time Calculation (min): 15 min  Past Medical History:  Past Medical History  Diagnosis Date  . Hyperlipidemia   . Arrhythmia     PVC  . Retinal detachment   . Renal disorder   . Allergy   . Occlusion and stenosis of carotid artery without mention of  cerebral infarction 03/24/2012  . Pneumonia, organism unspecified 11/192013  . Depressive disorder, not elsewhere  classified 03/05/2011  . Other premature beats 03/05/2011  . Other seborrheic keratosis 11/20/2010  . Syncope and collapse 10/02/2010  . Palpitations 11/21/2009  . Unspecified essential hypertension 06/27/2009  . Cardiomegaly 06/27/2009  . Other generalized ischemic cerebrovascular disease 06/27/2009  . Dizziness and giddiness 06/27/2009  . Unspecified vitamin D deficiency 05/16/2009  . Family history of sudden cardiac death (SCD) 2008/12/24  . Rash and other nonspecific skin eruption 07/05/2008  . Edema 03/15/2008  . Obesity, unspecified 07/25/2006  . Varicose veins of lower extremities with inflammation 03/16/2003   . Shortness of breath    Past Surgical History:  Past Surgical History  Procedure Laterality Date  . Cholecystectomy  1983  . Cataract extraction Bilateral     Dr. [name]  . Tonsillectomy    . Keratosis    . Retinal detachment surgery Left   . Cardioversion N/A 10/01/2013    Procedure: CARDIOVERSION;  Surgeon: Sanda Klein, MD;   Location: [facility] ENDOSCOPY;  Service: Cardiovascular;  Laterality:  N/A;  . Tee without cardioversion N/A 10/01/2013    Procedure: TRANSESOPHAGEAL ECHOCARDIOGRAM (TEE);  Surgeon:  Sanda Klein, MD;  Location: [facility] ENDOSCOPY;  Service:  Cardiovascular;  Laterality: N/A;   HPI:  80yo female with dx of acute respiratory failure in the setting  of bilateral R>L PNA.  Hx HTN, AFib, CHF, recent admit for PNA  (d/c 8/15). Returned 8/24 with worsening SOB, cough, chills. CXR  revealed multifocal PNA and pt admitted by [facility]. Course has been  c/b AFib with RVR and acute on chronic dCHF. Respiratory status  worse overnight requiring NRB; now back on [place].  Swallow eval  ordered to r/o aspiration.       Assessment / Plan / Recommendation Clinical Impression  Dysphagia Diagnosis: Within Functional Limits Clinical impression: Pt presents with quite functional swallow  with adequate mastication, age-related swallow delays, mild  vallecular residue of solids, and consistent/reliable laryngeal  closure with all  tested consistencies.  No penetration/aspiration  observed.  Esophageal sweep revealed barium stasis of solid  materials; liquid wash facilitated clearance.  Recommend  continued regular consistency diet, thin liquids - no SLP f/u  warranted.             Diet Recommendation Regular;Thin liquid   Liquid Administration via: Cup;Straw Medication Administration: Whole meds with liquid Supervision: Patient able to self feed Compensations: Follow solids with liquid    Other  Recommendations Oral Care Recommendations: Oral care BID   Follow Up Recommendations  None           SLP Swallow Goals  n/a   General Date of Onset: 10/11/13 HPI: 80yo female with dx of acute respiratory failure in the  setting of bilateral R>L PNA.  Hx HTN, AFib, CHF, recent admit  for PNA (d/c 8/15). Returned 8/24 with worsening SOB, cough,  chills. CXR revealed multifocal PNA and pt admitted by [facility].  Course has been c/b AFib with RVR and acute on chronic dCHF.  Respiratory status worse overnight requiring NRB; now back on [place].   Swallow eval ordered to r/o aspiration.   Type of Study: Modified Barium Swallowing Study Reason for Referral: Objectively evaluate swallowing function Previous Swallow Assessment: bedside eval 10/15/13 Diet Prior to this Study: Regular;Thin liquids Temperature Spikes Noted: No Respiratory Status: Nasal cannula History of Recent Intubation: No Behavior/Cognition: Alert;Cooperative;Pleasant mood Oral Cavity - Dentition: Adequate natural dentition Oral Motor / Sensory Function: Within functional limits Self-Feeding Abilities: Able to feed self Patient Positioning: Upright in chair Baseline Vocal Quality: Breathy;Hoarse Volitional Cough: Strong Volitional Swallow: Able to elicit Anatomy: Within functional limits Pharyngeal Secretions: Not observed secondary MBS    Reason for Referral Objectively evaluate swallowing function   Oral Phase Oral Preparation/Oral Phase Oral Phase: WFL   Pharyngeal Phase Pharyngeal Phase Pharyngeal  Phase: Impaired Pharyngeal - Thin Pharyngeal - Thin Cup: Delayed swallow initiation Pharyngeal - Solids Pharyngeal - Puree: Delayed swallow initiation;Pharyngeal residue  - valleculae  Cervical Esophageal Phase    GO   Amanda L. [place], [place] CCC/SLP Pager [phone]  Cervical Esophageal Phase Cervical Esophageal Phase: Impaired Cervical Esophageal Phase - Solids Puree: Other (Comment) (barium stasis; clears with liquid wash)         [name] 10/16/2013, 12:16 PM       ASSESSMENT AND PLAN: 1. Atrial fibrillation with RVR: HR remains elevated; increase cardizem to 60 mg every 6 hours; transition to CD later; continue metoprolol; continue apixaban. If rate remains elevated and BP becomes an issue, may need to resume amiodarone.  2. Acute diastolic heart failure: Likely secondary to rapid atrial fibrillation, which is being driven by underlying pulmonary processes. Volume overloaded on exam. Change lasix to 40 IV daily and follow renal function. Once pneumonia improves, may need to consider resuming amiodarone with repeat DCCV 30 days later to see if she holds sinus rhythm; with moderate MS, she may not tolerate atrial fibrillation.  3. Acute hypoxic respiratory failure: Secondary to HCAP and acute DCHF. Currently on broad-spectrum antimicrobial therapy with azithromycin, aztreonam, and vancomycin, along with IV steroids and nebulizer/inhaler therapy. Also on IV diuretics  4. Moderate MS/mild to moderate MR  Kirk Ruths, M.D., F.A.C.C.

## 2013-10-18 NOTE — Progress Notes (Addendum)
Patient ID: Julie Haas, female   DOB: 11/16/1925, 80 y.o.   MRN: [id number]  [facility] PROGRESS NOTE  Dawnyel S Dubs MRN:[id number] DOB: 02/11/1926 DOA: 10/11/2013 PCP: GREEN, ARTHUR G, MD  Chart reviewed. Events of the night noted.  Brief narrative: 80 y.o. female with HTN, atrial fibrillation on AC, recent admission for PNA s/p TEE/DCCV (discharged on 8/15) and presented to [facility] ED with main concern of progressively worsening shortness of breath, mostly exertional but occasionally present at rest. This as need associated with productive cough of yellow sputum, subjective fevers, chills, LE edema and 2 pillow orthopnea.   Assessment and Plan:      Acute hypoxic respiratory failure  - secondary to HCAP/CHF Improving. Now on RA. MBS negative for aspiration -  recommendation CT chest repeated in 6-8 weeks to re-evaluate the areas of nodularity     HCAP (healthcare-associated pneumonia) Day 8 vanc and aztreonam. Will d/c. Day 4 azithromycin    HTN (hypertension) controlled    Atrial fibrillation with RVR Rate better    Acute on chronic diastolic heart failure Cardiology has increased lasix    Hypokalemia repleted  Code Status: DNR Family Communication: daughter at bedside Disposition Plan: SNF when stable  IV Access:   Peripheral IV Procedures and diagnostic studies:   Ct Chest Wo Contrast   10/11/2013  Patchy bilateral airspace process with nodularity most prominent over the right upper lobe likely due to infection. Patchy bronchiectatic change as described. Small bilateral pleural effusions. 1.3 cm pretracheal lymph node likely reactive. Recommend followup CT in 6-8 weeks to re-evaluate the areas of nodularity.  Cardiomegaly with calcification of the mitral valve annulus and left atrial enlargement. Dg Chest Portable 1 View  10/11/2013   Cardiomegaly with chronic interstitial thickening. No overt congestive failure.  Multi focal pulmonary opacities. Primarily felt to  represent areas of post infectious scarring. Cannot exclude progressive left lower lobe infection or aspiration.  Medical Consultants:   Cardiology  pulmonary Anti-Infectives:   Vancomycin 8/24 -->8/31 Aztreonam 8/24 -->8/31 azithro 8/28 -->   LOS: 7 days   HPI/Subjective: No complaints  Objective: Filed Vitals:   10/18/13 1350 10/18/13 1405 10/18/13 1550 10/18/13 2044  BP: 101/61   114/65  Pulse: 86 80  103  Temp: 98 F (36.7 C)   97.7 F (36.5 C)  TempSrc: Oral   Oral  Resp: 18   18  Height:      Weight:      SpO2: 93% 91% 94% 92%    Intake/Output Summary (Last 24 hours) at 10/18/13 2133 Last data filed at 10/18/13 1700  Gross per 24 hour  Intake    720 ml  Output    600 ml  Net    120 ml   tele: atrial fib. Rate 90s  Exam:   General:  In chair comfortable. Off oxygen. talkativ Cardiovascular: irreg irreg.  noMGR  Respiratory: breathing nonlabored. Rales at bases  Abdomen: Soft, non tender, non distended, bowel sounds present, no guarding  Ext: warm. Trace edema  Data Reviewed: Basic Metabolic Panel:  Recent Labs Lab 10/12/13 0404 10/13/13 0515 10/15/13 0534 10/18/13 0425 10/18/13 1118  NA 137 136* 135* 128* 129*  K 3.5* 3.4* 4.5 6.2* 5.3  CL 95* 96 95* 90* 92*  CO2 27 30 27 27 23  GLUCOSE 92 88 153* 124* 119*  BUN 13 11 12 30* 28*  CREATININE 0.60 0.52 0.57 0.66 0.55  CALCIUM 7.9* 8.0* 8.3* 8.8 8.7  MG 1.7  --   --   --   --      CBC:  Recent Labs Lab 10/12/13 0404 10/13/13 0515 10/15/13 0534 10/18/13 0425  WBC 6.7 6.8 9.6 17.1*  HGB 11.1* 10.9* 12.4 11.8*  HCT 33.1* 32.7* 38.3 35.1*  MCV 93.2 92.6 96.7 95.1  PLT 281 293 376 433*   Cardiac Enzymes:  Recent Labs Lab 10/12/13 0126 10/12/13 0940  TROPONINI <0.30 <0.30   Recent Results (from the past 240 hour(s))  URINE CULTURE     Status: None   Collection Time    10/11/13  1:25 PM      Result Value Ref Range Status   Specimen Description URINE, CLEAN CATCH   Final    Special Requests NONE   Final   Culture  Setup Time     Final   Value: 10/11/2013 20:09     Performed at [facility]     Final   Value: 70,000 COLONIES/ML     Performed at [facility]   Culture     Final   Value: STAPHYLOCOCCUS SPECIES (COAGULASE NEGATIVE)     Note: RIFAMPIN AND GENTAMICIN SHOULD NOT BE USED AS SINGLE DRUGS FOR TREATMENT OF STAPH INFECTIONS.     Performed at [facility]   Report Status 10/15/2013 FINAL   Final   Organism ID, Bacteria STAPHYLOCOCCUS SPECIES (COAGULASE NEGATIVE)   Final  CULTURE, BLOOD (ROUTINE X 2)     Status: None   Collection Time    10/11/13  7:33 PM      Result Value Ref Range Status   Specimen Description BLOOD RIGHT ARM   Final   Special Requests BOTTLES DRAWN AEROBIC AND ANAEROBIC 10CC   Final   Culture  Setup Time     Final   Value: 10/12/2013 04:12     Performed at [facility]   Culture     Final   Value: NO GROWTH 5 DAYS     Performed at [facility]   Report Status 10/18/2013 FINAL   Final  CULTURE, BLOOD (ROUTINE X 2)     Status: None   Collection Time    10/11/13  7:43 PM      Result Value Ref Range Status   Specimen Description BLOOD LEFT ARM   Final   Special Requests BOTTLES DRAWN AEROBIC AND ANAEROBIC 5CC   Final   Culture  Setup Time     Final   Value: 10/12/2013 04:13     Performed at [facility]   Culture     Final   Value: NO GROWTH 5 DAYS     Performed at [facility]   Report Status 10/18/2013 FINAL   Final  RESPIRATORY VIRUS PANEL     Status: None   Collection Time    10/14/13  4:50 PM      Result Value Ref Range Status   Source - [facility] NASAL SWAB   Corrected   Comment: CORRECTED ON 08/28 AT 2020: PREVIOUSLY REPORTED AS NASAL SWAB   Respiratory Syncytial Virus A NOT DETECTED   Final   Respiratory Syncytial Virus B NOT DETECTED   Final   Influenza A NOT DETECTED   Final   Influenza B NOT DETECTED   Final   Parainfluenza 1 NOT DETECTED    Final   Parainfluenza 2 NOT DETECTED   Final   Parainfluenza 3 NOT DETECTED   Final   Metapneumovirus NOT DETECTED   Final   Rhinovirus NOT DETECTED   Final   Adenovirus NOT  DETECTED   Final   Influenza A H1 NOT DETECTED   Final   Influenza A H3 NOT DETECTED   Final   Comment: (NOTE)           Normal Reference Range for each Analyte: NOT DETECTED     Testing performed using the [employer] xTAG Respiratory Viral Panel test     kit.     The analytical performance characteristics of this assay have been     determined by [facility].  The modifications have not been     cleared or approved by the FDA. This assay has been validated pursuant     to the CLIA regulations and is used for clinical purposes.     Performed at [facility]     Scheduled Meds: . apixaban  5 mg Oral BID  . aztreonam  1 g Intravenous Q8H  . furosemide  40 mg Intravenous BID  . hydrocerin   Topical Daily  . losartan  50 mg Oral Daily  . metoprolol  50 mg Oral BID  . potassium chloride SA  20 mEq Oral Daily  . vancomycin  750 mg Intravenous Q12H  . vitamin E  1,000 Units Oral Daily   Continuous Infusions:    SULLIVAN,CORINNA L, MD [facility] Pager [phone]  If 7PM-7AM, please contact night-coverage [email or web address] Password [email or web address] 10/15/2013, 8:16 AM      

## 2013-10-19 LAB — LEGIONELLA ANTIGEN, URINE: Legionella Antigen, Urine: NEGATIVE

## 2013-10-19 LAB — BASIC METABOLIC PANEL
Anion gap: 9 (ref 5–15)
BUN: 31 mg/dL — AB (ref 6–23)
CO2: 27 mEq/L (ref 19–32)
Calcium: 8.4 mg/dL (ref 8.4–10.5)
Chloride: 92 mEq/L — ABNORMAL LOW (ref 96–112)
Creatinine, Ser: 0.62 mg/dL (ref 0.50–1.10)
GFR calc Af Amer: >90 mL/min (ref >90)
GFR, EST NON AFRICAN AMERICAN: 80 mL/min — AB (ref >90)
GLUCOSE: 119 mg/dL — AB (ref 70–99)
Potassium: 5 mEq/L (ref 3.7–5.3)
Sodium: 128 mEq/L — ABNORMAL LOW (ref 137–147)

## 2013-10-19 LAB — STREP PNEUMONIAE URINARY ANTIGEN: STREP PNEUMO URINARY ANTIGEN: NEGATIVE

## 2013-10-19 MED ORDER — PREDNISONE 20 MG PO TABS
40.0000 mg | ORAL_TABLET | Freq: Every day | ORAL | Status: DC
  Administered 2013-10-20: 40 mg via ORAL
  Filled 2013-10-19 (×2): qty 2

## 2013-10-19 MED ORDER — AZITHROMYCIN 500 MG PO TABS
500.0000 mg | ORAL_TABLET | Freq: Every day | ORAL | Status: DC
  Administered 2013-10-20: 500 mg via ORAL
  Filled 2013-10-19: qty 1

## 2013-10-19 MED ORDER — FUROSEMIDE 40 MG PO TABS
40.0000 mg | ORAL_TABLET | Freq: Every day | ORAL | Status: DC
  Administered 2013-10-19 – 2013-10-20 (×2): 40 mg via ORAL
  Filled 2013-10-19 (×2): qty 1

## 2013-10-19 NOTE — Progress Notes (Signed)
Name: Julie Haas MRN: [id number] DOB: Aug 01, 1925    ADMISSION DATE:  10/11/2013 CONSULTATION DATE:  8/27  REFERRING MD :  Algis Liming  PRIMARY SERVICE:  [facility]   CHIEF COMPLAINT:  HCAP  BRIEF PATIENT DESCRIPTION:  80yo female with hx HTN, AFib, CHF, recent admit for PNA (d/c 8/15).  Returned 8/24 with worsening SOB, cough, chills.  CXR revealed multifocal PNA and pt admitted by [facility].  Course has been c/b AFib with RVR and acute on chronic dCHF.  PCCM consulted for assist with HCAP.   SIGNIFICANT EVENTS / STUDIES:  CT chest 8/24>>>patchy bilat asd with nodularity most prominent RUL, small bilat effusions.   LINES / TUBES: none  CULTURES: Urine 8/24>>>neg BCx2 8/24>>>staph epi resp viral panel 8/27>>>neg u strep 8/28>>>neg u legionella 8/28>>>neg  ANTIBIOTICS: Vanc >>> Aztreonam 8/24>>> azithro 8/28>>>  EVENTS 8/28. Required increased O2. Steroid trial initiated. DNR re-validated.  831/15: Improved.  On 2L .   Neg 5L since admission.  Wants to go home.      SUBJECTIVE/OVERNIGHT/INTERVAL HX 10/19/13: Feels better. Wants to go home. On room air  VITAL SIGNS: Temp:  [97.7 F (36.5 C)-98.1 F (36.7 C)] 98.1 F (36.7 C) (09/01 0553) Pulse Rate:  [63-103] 63 (09/01 1051) Resp:  [18] 18 (09/01 0553) BP: (101-121)/(61-75) 114/72 mmHg (09/01 1051) SpO2:  [89 %-94 %] 89 % (09/01 1051) Weight:  [94.5 kg (208 lb 5.4 oz)] 94.5 kg (208 lb 5.4 oz) (09/01 0553) 4 liters   Intake/Output Summary (Last 24 hours) at 10/19/13 1148 Last data filed at 10/19/13 1056  Gross per 24 hour  Intake    923 ml  Output    751 ml  Net    172 ml   PHYSICAL EXAMINATION: General:  Chronically ill appearing female, NAD , looking better Neuro:  Awake, alert,  HEENT:  Mm moist, no JVD  Cardiovascular:  s1s2 irreg, tachy Lungs: resps even non labored, few scattered crackles Abdomen:  Soft, +bs Musculoskeletal:  Warm and dry, no sig edema   PULMONARY  Recent Labs Lab 10/15/13 0122    PHART 7.376  PCO2ART 49.0*  PO2ART 66.3*  HCO3 28.1*  TCO2 29.6  O2SAT 92.2    CBC  Recent Labs Lab 10/13/13 0515 10/15/13 0534 10/18/13 0425  HGB 10.9* 12.4 11.8*  HCT 32.7* 38.3 35.1*  WBC 6.8 9.6 17.1*  PLT 293 376 433*    COAGULATION No results found for this basename: INR,  in the last 168 hours  CARDIAC  No results found for this basename: TROPONINI,  in the last 168 hours  Recent Labs Lab 10/14/13 1220 10/15/13 0534  PROBNP 2071.0* 2137.0*     CHEMISTRY  Recent Labs Lab 10/13/13 0515 10/15/13 0534 10/18/13 0425 10/18/13 1118 10/19/13 0340  NA 136* 135* 128* 129* 128*  K 3.4* 4.5 6.2* 5.3 5.0  CL 96 95* 90* 92* 92*  CO2 30 27 27 23 27   GLUCOSE 88 153* 124* 119* 119*  BUN 11 12 30* 28* 31*  CREATININE 0.52 0.57 0.66 0.55 0.62  CALCIUM 8.0* 8.3* 8.8 8.7 8.4   Estimated Creatinine Clearance: 55.1 ml/min (by C-G formula based on Cr of 0.62).   LIVER No results found for this basename: AST, ALT, ALKPHOS, BILITOT, PROT, ALBUMIN, INR,  in the last 168 hours   INFECTIOUS No results found for this basename: LATICACIDVEN, PROCALCITON,  in the last 168 hours   ENDOCRINE CBG (last 3)  No results found for this basename: GLUCAP,  in  the last 72 hours       IMAGING x48h No results found.      ASSESSMENT / PLAN: Acute respiratory failure in the setting of bilateral R>L pulmonary infiltrates. D dx include HCAP w/ ARDS, viral pneumonitis, pulmonary edema, cryptogenic organizing PNA.   Clinically better on steroids.  Amiodarone stopped 8/28  Further better 10/19/13  PLAN -  -Cont abx as above  -continue diuresis as below  -pulm hygiene  -intermittent f/u CXR  -supplemental O2 -will need f/u CT chest 6-8 weeks - cont empiric steroid trial; change to po prednisone 10/19/2013 and then taper over few to several weeks Pt is established DNR -continue to hold amiodarone  0 OPD followup - 10/27/13 wed 10am with Rexene Edison NP for Dr  Chase Caller  AFib with RVR - s/p TEE and failed cardioversion last admit Acute on chronic dCHF Plan -  -Cards following  -Cont lopressor, eliquis  -Cont diuresis per cards as BP, Scr tol  -may need to resume amiodarone once resp status improved but avoid to extent possible   Son updated and aalso d/w Dr Eliseo Squires of [facility]. PCCM will sign off  Dr. Brand Males, M.D., [facility].C.P Pulmonary and Critical Care Medicine Staff Physician [place] Pager: [phone], If no answer or between  15:00h - 7:00h: call [phone]  10/19/2013 11:48 AM

## 2013-10-19 NOTE — Progress Notes (Signed)
Patient: Julie Haas / Admit Date: 10/11/2013 / Date of Encounter: 10/19/2013, 9:54 AM   Subjective: Breathing better. Sitting in chair on RA. Appears much more comfortable than last time I saw her. No chest pain, palpitations, SOB, or dyspnea.    Objective: Telemetry: A-fib, mostly 80s with a couple episodes into the 120s Physical Exam: Blood pressure 121/75, pulse 82, temperature 98.1 F (36.7 C), temperature source Oral, resp. rate 18, height 5\' 2"  (1.575 m), weight 208 lb 5.4 oz (94.5 kg), SpO2 93.00%. General: Well developed, well nourished, in no acute distress. Head: Normocephalic, atraumatic, sclera non-icteric, no xanthomas, nares are without discharge. Neck: Negative for carotid bruits. JVP not elevated. Lungs: Minimal basilar crackles. Breathing is unlabored. Heart: Irregular S1 S2 without murmurs, rubs, or gallops.  Abdomen: Soft, non-tender, non-distended with normoactive bowel sounds. No rebound/guarding. Extremities: No clubbing or cyanosis. 1+ non pitting edema bilateral LE. Chronic venous stasis left leg.  Neuro: Alert and oriented X 3. Moves all extremities spontaneously. Psych:  Responds to questions appropriately with a normal affect.   Intake/Output Summary (Last 24 hours) at 10/19/13 0954 Last data filed at 10/19/13 0445  Gross per 24 hour  Intake    680 ml  Output    500 ml  Net    180 ml    Inpatient Medications:  . apixaban  5 mg Oral BID  . azithromycin  500 mg Intravenous Q24H  . diltiazem  60 mg Oral 4 times per day  . docusate sodium  100 mg Oral BID  . furosemide  40 mg Intravenous Daily  . hydrocerin   Topical Daily  . methylPREDNISolone (SOLU-MEDROL) injection  40 mg Intravenous Q12H  . metoprolol  37.5 mg Oral BID  . sodium chloride  3 mL Intravenous Q12H  . vitamin E  1,000 Units Oral Daily   Infusions:    Labs:  Recent Labs  10/18/13 1118 10/19/13 0340  NA 129* 128*  K 5.3 5.0  CL 92* 92*  CO2 23 27  GLUCOSE 119* 119*    BUN 28* 31*  CREATININE 0.55 0.62  CALCIUM 8.7 8.4   No results found for this basename: AST, ALT, ALKPHOS, BILITOT, PROT, ALBUMIN,  in the last 72 hours  Recent Labs  10/18/13 0425  WBC 17.1*  HGB 11.8*  HCT 35.1*  MCV 95.1  PLT 433*   No results found for this basename: CKTOTAL, CKMB, TROPONINI,  in the last 72 hours No components found with this basename: POCBNP,  No results found for this basename: HGBA1C,  in the last 72 hours   Radiology/Studies:  Dg Chest 2 View  10/17/2013   CLINICAL DATA:  80 year old female shortness of Breath. Pneumonia. atrial fibrillation Initial encounter.  EXAM: CHEST  2 VIEW  COMPARISON:  10/15/2013 and earlier.  FINDINGS: Portable AP semi upright view at 0711 hrs. Interval larger and more confluent bilateral nodular pulmonary opacities. As before, the left base is most heavily affected. Mild veiling opacity at both lung bases suggesting small effusions. Stable cardiac size and mediastinal contours. Visualized tracheal air column is within normal limits.  IMPRESSION: Radiographic progression of bilateral pneumonia, with larger more confluent bilateral opacities. Small effusions.   Electronically Signed   By: Lars Pinks M.D.   On: 10/17/2013 07:46   Dg Chest 2 View  09/30/2013   EXAM: CHEST  2 VIEW  COMPARISON:  None.  FINDINGS: The heart size and mediastinal contours are within normal limits. Both lungs are clear.  The visualized skeletal structures are unremarkable.  IMPRESSION: No active cardiopulmonary disease.   Electronically Signed   By: Marin Olp M.D.   On: 09/30/2013 08:04   Dg Chest 2 View  09/28/2013   CLINICAL DATA:  Cough  EXAM: CHEST  2 VIEW  COMPARISON:  09/27/2013  FINDINGS: Cardiac shadow is stable. Patchy infiltrates are again noted bilaterally. Increasing small left effusion is seen. No new focal abnormality is noted.  IMPRESSION: Patchy bilateral infiltrates.   Electronically Signed   By: Inez Catalina M.D.   On: 09/28/2013 11:09    Ct Chest Wo Contrast  10/11/2013   CLINICAL DATA:  Recurrent pneumonia.  EXAM: CT CHEST WITHOUT CONTRAST  TECHNIQUE: Multidetector CT imaging of the chest was performed following the standard protocol without IV contrast.  COMPARISON:  CT 01/23/2009 and series if recent chest radiographs August 2015.  FINDINGS: Lungs are adequately inflated demonstrate a very small amount of bilateral pleural fluid. There is a patchy bilateral airspace process with some nodularity worse over the right upper lobe. Minimal patchy bronchiectatic change over the lingula, posterior right upper lobe and left apex.  Mild cardiomegaly with a very small amount of pericardial fluid. There is moderate calcific plaque involving the left main, left and descending and lateral circumflex coronary arteries. Calcification over the mitral valve annulus with mild left atrial enlargement mild prominence of the main pulmonary arteries. There is a 1.3 cm pretracheal lymph node slightly larger. There is calcified plaque involving the thoracic aorta. Remaining mediastinal structures are unremarkable.  Images through the upper abdomen demonstrated 1.1 cm hypodensity over the left dome of the liver unchanged likely a cyst. Evidence of a prior cholecystectomy. Remainder of the exam is unchanged  IMPRESSION: Patchy bilateral airspace process with nodularity most prominent over the right upper lobe likely due to infection. Patchy bronchiectatic change as described. Small bilateral pleural effusions. 1.3 cm pretracheal lymph node likely reactive. Recommend followup CT in 6-8 weeks to re-evaluate the areas of nodularity.  Cardiomegaly with calcification of the mitral valve annulus and left atrial enlargement. Atherosclerotic coronary artery disease. Small amount of pericardial fluid.  1.1 cm liver cyst unchanged.   Electronically Signed   By: Marin Olp M.D.   On: 10/11/2013 17:34   Dg Chest Port 1 View  10/15/2013   CLINICAL DATA:  Increased shortness  of breath and cough.  EXAM: PORTABLE CHEST - 1 VIEW  COMPARISON:  10/11/2013  FINDINGS: Cardiac enlargement. Diffuse interstitial and alveolar parenchymal infiltrates, increasing since previous study. Changes may represent diffuse pneumonia or edema. Nodular infiltrates again demonstrated in the right upper lung. Blunting of the right costophrenic angle suggesting a developing right pleural effusion.  IMPRESSION: Increasing diffuse airspace and interstitial infiltrates in the lungs. Developing small right pleural effusion.   Electronically Signed   By: Lucienne Capers M.D.   On: 10/15/2013 01:21   Dg Chest Portable 1 View  10/11/2013   CLINICAL DATA:  Atrial fibrillation and tachycardia.  EXAM: PORTABLE CHEST - 1 VIEW  COMPARISON:  Chest radiograph of 09/30/2013.  CT of 01/23/2009  FINDINGS: Midline trachea. Moderate cardiomegaly. Chronic right-sided pleural thickening blunts the costophrenic angle. Possible small left pleural effusion. No pneumothorax. Interstitial thickening is chronic. Patchy right upper lobe peripheral opacity has been present back to 01/10/2012. There is also chronic right base pulmonary opacity. Left lower lobe pulmonary opacity may be progressive since 09/30/2013. Grossly similar to 09/28/2013.  IMPRESSION: Cardiomegaly with chronic interstitial thickening. No overt congestive failure.  Multi  focal pulmonary opacities. Primarily felt to represent areas of post infectious scarring. Cannot exclude progressive left lower lobe infection or aspiration.  Given chronicity, CT may be informative to re-evaluate the pulmonary opacities.   Electronically Signed   By: Abigail Miyamoto M.D.   On: 10/11/2013 13:33   Dg Chest Port 1 View  09/27/2013   CLINICAL DATA:  Shortness of breath.  EXAM: PORTABLE CHEST - 1 VIEW  COMPARISON:  Chest radiograph performed 09/26/2013  FINDINGS: The lungs are well-aerated. Vascular congestion is noted, with diffuse bilateral airspace opacities, possibly reflecting  multifocal pneumonia or pulmonary edema. Small bilateral pleural effusions are seen. No pneumothorax is identified.  The cardiomediastinal silhouette is mildly enlarged. No acute osseous abnormalities are seen.  IMPRESSION: Vascular congestion and mild cardiomegaly, with diffuse bilateral airspace opacities, possibly reflecting multifocal pneumonia or pulmonary edema. Small bilateral pleural effusions seen.   Electronically Signed   By: Garald Balding M.D.   On: 09/27/2013 04:55   Dg Chest Port 1 View  09/26/2013   CLINICAL DATA:  Dyspnea.  Hypoxia.  EXAM: PORTABLE CHEST - 1 VIEW  COMPARISON:  Chest x-ray 09/24/2013.  FINDINGS: Patchy areas of interstitial prominence and multifocal airspace disease are seen scattered throughout the lungs bilaterally (right greater than left). Small bilateral pleural effusions. There does not appear to be cephalization of the pulmonary vasculature. Heart size appears borderline enlarged, accentuated by low lung volumes and lordotic positioning on this portable radiograph. Upper mediastinal contours are within normal limits allowing for these technique related limitations. Atherosclerosis in the thoracic aorta.  IMPRESSION: 1. Multifocal patchy asymmetrically distributed interstitial and airspace disease throughout the lungs bilaterally (right greater than left), concerning for multilobar pneumonia. 2. Small bilateral pleural effusions. 3. Borderline cardiomegaly. 4. Atherosclerosis.   Electronically Signed   By: Vinnie Langton M.D.   On: 09/26/2013 17:06   Dg Chest Port 1 View  09/24/2013   CLINICAL DATA:  Shortness of breath.  EXAM: PORTABLE CHEST - 1 VIEW  COMPARISON:  01/10/2012.  FINDINGS: There is chronic cardiomegaly. Aortic atherosclerosis. No acute aortic contour abnormality.  There is diffuse interstitial coarsening which is increased from previous. Chronic patchy opacities in the bilateral lungs, most confluent in the right upper and right lower chest. Based on chest  CT 01/23/2009, these are likely postinfectious scarring. No definitive effusion. No pneumothorax.  IMPRESSION: When accounting for chronic lung scarring (likely postinfectious), no definite edema or pneumonia.   Electronically Signed   By: Jorje Guild M.D.   On: 09/24/2013 02:11   Dg Swallowing Func-speech Pathology  10/16/2013   Assunta Curtis, CCC-SLP     10/16/2013 12:17 PM Objective Swallowing Evaluation: Modified Barium Swallowing Study   Patient Details  Name: Julie Haas MRN: [id number] Date of Birth: 12-09-1925  Today's Date: 10/16/2013 Time: 1145-1200 SLP Time Calculation (min): 15 min  Past Medical History:  Past Medical History  Diagnosis Date  . Hyperlipidemia   . Arrhythmia     PVC  . Retinal detachment   . Renal disorder   . Allergy   . Occlusion and stenosis of carotid artery without mention of  cerebral infarction 03/24/2012  . Pneumonia, organism unspecified 11/192013  . Depressive disorder, not elsewhere classified 03/05/2011  . Other premature beats 03/05/2011  . Other seborrheic keratosis 11/20/2010  . Syncope and collapse 10/02/2010  . Palpitations 11/21/2009  . Unspecified essential hypertension 06/27/2009  . Cardiomegaly 06/27/2009  . Other generalized ischemic cerebrovascular disease 06/27/2009  . Dizziness and giddiness 06/27/2009  .  Unspecified vitamin D deficiency 05/16/2009  . Family history of sudden cardiac death (SCD) December 21, 2008  . Rash and other nonspecific skin eruption 07/05/2008  . Edema 03/15/2008  . Obesity, unspecified 07/25/2006  . Varicose veins of lower extremities with inflammation 03/16/2003   . Shortness of breath    Past Surgical History:  Past Surgical History  Procedure Laterality Date  . Cholecystectomy  1983  . Cataract extraction Bilateral     Dr. [name]  . Tonsillectomy    . Keratosis    . Retinal detachment surgery Left   . Cardioversion N/A 10/01/2013    Procedure: CARDIOVERSION;  Surgeon: Sanda Klein, MD;   Location: [facility] ENDOSCOPY;  Service: Cardiovascular;   Laterality:  N/A;  . Tee without cardioversion N/A 10/01/2013    Procedure: TRANSESOPHAGEAL ECHOCARDIOGRAM (TEE);  Surgeon:  Sanda Klein, MD;  Location: [facility] ENDOSCOPY;  Service:  Cardiovascular;  Laterality: N/A;   HPI:  80yo female with dx of acute respiratory failure in the setting  of bilateral R>L PNA.  Hx HTN, AFib, CHF, recent admit for PNA  (d/c 8/15). Returned 8/24 with worsening SOB, cough, chills. CXR  revealed multifocal PNA and pt admitted by [facility]. Course has been  c/b AFib with RVR and acute on chronic dCHF. Respiratory status  worse overnight requiring NRB; now back on [place].  Swallow eval  ordered to r/o aspiration.       Assessment / Plan / Recommendation Clinical Impression  Dysphagia Diagnosis: Within Functional Limits Clinical impression: Pt presents with quite functional swallow  with adequate mastication, age-related swallow delays, mild  vallecular residue of solids, and consistent/reliable laryngeal  closure with all tested consistencies.  No penetration/aspiration  observed.  Esophageal sweep revealed barium stasis of solid  materials; liquid wash facilitated clearance.  Recommend  continued regular consistency diet, thin liquids - no SLP f/u  warranted.             Diet Recommendation Regular;Thin liquid   Liquid Administration via: Cup;Straw Medication Administration: Whole meds with liquid Supervision: Patient able to self feed Compensations: Follow solids with liquid    Other  Recommendations Oral Care Recommendations: Oral care BID   Follow Up Recommendations  None           SLP Swallow Goals  n/a   General Date of Onset: 10/11/13 HPI: 80yo female with dx of acute respiratory failure in the  setting of bilateral R>L PNA.  Hx HTN, AFib, CHF, recent admit  for PNA (d/c 8/15). Returned 8/24 with worsening SOB, cough,  chills. CXR revealed multifocal PNA and pt admitted by [facility].  Course has been c/b AFib with RVR and acute on chronic dCHF.  Respiratory status worse overnight requiring NRB;  now back on [place].   Swallow eval ordered to r/o aspiration.   Type of Study: Modified Barium Swallowing Study Reason for Referral: Objectively evaluate swallowing function Previous Swallow Assessment: bedside eval 10/15/13 Diet Prior to this Study: Regular;Thin liquids Temperature Spikes Noted: No Respiratory Status: Nasal cannula History of Recent Intubation: No Behavior/Cognition: Alert;Cooperative;Pleasant mood Oral Cavity - Dentition: Adequate natural dentition Oral Motor / Sensory Function: Within functional limits Self-Feeding Abilities: Able to feed self Patient Positioning: Upright in chair Baseline Vocal Quality: Breathy;Hoarse Volitional Cough: Strong Volitional Swallow: Able to elicit Anatomy: Within functional limits Pharyngeal Secretions: Not observed secondary MBS    Reason for Referral Objectively evaluate swallowing function   Oral Phase Oral Preparation/Oral Phase Oral Phase: WFL   Pharyngeal Phase Pharyngeal Phase Pharyngeal Phase: Impaired  Pharyngeal - Thin Pharyngeal - Thin Cup: Delayed swallow initiation Pharyngeal - Solids Pharyngeal - Puree: Delayed swallow initiation;Pharyngeal residue  - valleculae  Cervical Esophageal Phase    GO   Amanda L. [place], [place] CCC/SLP Pager [phone]  Cervical Esophageal Phase Cervical Esophageal Phase: Impaired Cervical Esophageal Phase - Solids Puree: Other (Comment) (barium stasis; clears with liquid wash)         [name] 10/16/2013, 12:16 PM      Assessment and Plan  80 y.o. female with h/o a-fib on [facility] (diagnosed last admission), HTN, HLD, and mod MS/mild-mod MR who presented to [facility] 10/11/13 after recently being discharged 8/15 for a-fib with RVR s/p TEE/DCCV, and PNA. She came in 8/24 with worsening SOB, DOE, mild productive cough, and chills. She was found to have a-fib with RVR, acute diastolic CHF, and acute respiratory failure/HCAP.  1. A-fib with RVR: -HR improved with rates mostly in the 80s -Patient unlikely to be able to take  Cardizem 60 qid at home successfully. Will change to Cardizem 240 CD at discharge.  -Continue Lopressor 37.5 mg bid -On apixaban 5 mg bid  2. Acute diastolic CHF: -Likely 2/2 rapid a-fib, which is being driven by underlying pulmonary process.  -Change IV Lasix 40 mg to PO Lasix 40 mg, discussed with IM, likely discharge 9/2 to rehab  -Once PNA resolves consider resuming amiodarone followed by repeat DCCV 30 days later.   3. Acute respiratory failure: -Improved -2/2 HCAP and acute DCHF -Currently on broad spectrum ABX with azithromycin, aztreonam, and vancomycin, along with IV steroids and nebulizer/inhaler therapy. Also on IV diuretics.  4. Moderate MS/mild-moderate MR          Signed, Christell Faith, PA-C 10/19/2013 10:11 AM As above, patient seen and examined. She denies dyspnea or chest pain. Heart rate is not controlled. Change Cardizem to 240 mg daily beginning tomorrow morning. Continue metoprolol. Continue apixaban. She remains somewhat volume overloaded. Continue Lasix at present dose. Fluid restrict. Follow sodium and renal function. As her pneumonia improves a decision can be made about resuming amiodarone with repeat attempt at cardioversion. I will leave this to Dr. Percival Spanish. Probable discharge tomorrow morning if stable. Kirk Ruths

## 2013-10-19 NOTE — Progress Notes (Signed)
Patient ID: Julie Haas, female   DOB: [birth year], 80 y.o.   MRN: [id number]  [facility] PROGRESS NOTE  JEFFERY GAMMELL WUJ:[id number] DOB: [birth year] DOA: 10/11/2013 PCP: Estill Dooms, MD    Brief narrative: 80 y.o. female with HTN, atrial fibrillation on [facility], recent admission for PNA s/p TEE/DCCV (discharged on 8/15) and presented to [facility] ED with main concern of progressively worsening shortness of breath, mostly exertional but occasionally present at rest. This as need associated with productive cough of yellow sputum, subjective fevers, chills, LE edema and 2 pillow orthopnea.   Assessment and Plan:      Acute hypoxic respiratory failure  - secondary to HCAP/CHF Improving. Now on RA. MBS negative for aspiration -  recommendation CT chest repeated in 6-8 weeks to re-evaluate the areas of nodularity     HCAP (healthcare-associated pneumonia) Day 8 vanc and aztreonam. Will d/c. Day 4 azithromycin    HTN (hypertension) controlled    Atrial fibrillation with RVR Rate better    Acute on chronic diastolic heart failure Cardiology has increased lasix to IV but now back to PO    Hypokalemia repleted  Code Status: DNR Family Communication: son at bedside Disposition Plan: SNF in AM??  IV Access:   Peripheral IV Procedures and diagnostic studies:   Ct Chest Wo Contrast   10/11/2013  Patchy bilateral airspace process with nodularity most prominent over the right upper lobe likely due to infection. Patchy bronchiectatic change as described. Small bilateral pleural effusions. 1.3 cm pretracheal lymph node likely reactive. Recommend followup CT in 6-8 weeks to re-evaluate the areas of nodularity.  Cardiomegaly with calcification of the mitral valve annulus and left atrial enlargement. Dg Chest Portable 1 View  10/11/2013   Cardiomegaly with chronic interstitial thickening. No overt congestive failure.  Multi focal pulmonary opacities. Primarily felt to represent areas of post  infectious scarring. Cannot exclude progressive left lower lobe infection or aspiration.  Medical Consultants:   Cardiology  pulmonary Anti-Infectives:   Vancomycin 8/24 -->8/31 Aztreonam 8/24 -->8/31 azithro 8/28 -->   LOS: 8 days   HPI/Subjective: Wants to be discharged today  Objective: Filed Vitals:   10/18/13 2044 10/19/13 0553 10/19/13 1051 10/19/13 1100  BP: 114/65 121/75 114/72 119/68  Pulse: 103 82 63 76  Temp: 97.7 F (36.5 C) 98.1 F (36.7 C)    TempSrc: Oral Oral    Resp: 18 18    Height:      Weight:  94.5 kg (208 lb 5.4 oz)    SpO2: 92% 93% 89%     Intake/Output Summary (Last 24 hours) at 10/19/13 1335 Last data filed at 10/19/13 1056  Gross per 24 hour  Intake    683 ml  Output    451 ml  Net    232 ml     Exam:   General:  In chair comfortable. Off oxygen. talkative Cardiovascular: irreg irreg.  noMGR  Respiratory: breathing nonlabored. Rales at bases  Abdomen: Soft, non tender, non distended, bowel sounds present, no guarding  Ext: warm. Trace edema  Data Reviewed: Basic Metabolic Panel:  Recent Labs Lab 10/13/13 0515 10/15/13 0534 10/18/13 0425 10/18/13 1118 10/19/13 0340  NA 136* 135* 128* 129* 128*  K 3.4* 4.5 6.2* 5.3 5.0  CL 96 95* 90* 92* 92*  CO2 _0 GLUCOSE 88 153* 124* 119* 119*  BUN 11 12 30* 28* 31*  CREATININE 0.52 0.57 0.66 0.55 0.62  CALCIUM 8.0* 8.3* 8.8 8.7  8.4   CBC:  Recent Labs Lab 10/13/13 0515 10/15/13 0534 10/18/13 0425  WBC 6.8 9.6 17.1*  HGB 10.9* 12.4 11.8*  HCT 32.7* 38.3 35.1*  MCV 92.6 96.7 95.1  PLT 293 376 433*   Cardiac Enzymes: No results found for this basename: CKTOTAL, CKMB, CKMBINDEX, TROPONINI,  in the last 168 hours Recent Results (from the past 240 hour(s))  URINE CULTURE     Status: None   Collection Time    10/11/13  1:25 PM      Result Value Ref Range Status   Specimen Description URINE, CLEAN CATCH   Final   Special Requests NONE   Final   Culture  Setup  Time     Final   Value: 10/11/2013 20:09     Performed at [facility]     Final   Value: 70,000 COLONIES/ML     Performed at [facility]   Culture     Final   Value: STAPHYLOCOCCUS SPECIES (COAGULASE NEGATIVE)     Note: RIFAMPIN AND GENTAMICIN SHOULD NOT BE USED AS SINGLE DRUGS FOR TREATMENT OF STAPH INFECTIONS.     Performed at [facility]   Report Status 10/15/2013 FINAL   Final   Organism ID, Bacteria STAPHYLOCOCCUS SPECIES (COAGULASE NEGATIVE)   Final  CULTURE, BLOOD (ROUTINE X 2)     Status: None   Collection Time    10/11/13  7:33 PM      Result Value Ref Range Status   Specimen Description BLOOD RIGHT ARM   Final   Special Requests BOTTLES DRAWN AEROBIC AND ANAEROBIC 10CC   Final   Culture  Setup Time     Final   Value: 10/12/2013 04:12     Performed at [facility]   Culture     Final   Value: NO GROWTH 5 DAYS     Performed at [facility]   Report Status 10/18/2013 FINAL   Final  CULTURE, BLOOD (ROUTINE X 2)     Status: None   Collection Time    10/11/13  7:43 PM      Result Value Ref Range Status   Specimen Description BLOOD LEFT ARM   Final   Special Requests BOTTLES DRAWN AEROBIC AND ANAEROBIC 5CC   Final   Culture  Setup Time     Final   Value: 10/12/2013 04:13     Performed at [facility]   Culture     Final   Value: NO GROWTH 5 DAYS     Performed at [facility]   Report Status 10/18/2013 FINAL   Final  RESPIRATORY VIRUS PANEL     Status: None   Collection Time    10/14/13  4:50 PM      Result Value Ref Range Status   Source - [facility] NASAL SWAB   Corrected   Comment: CORRECTED ON 08/28 AT 2020: PREVIOUSLY REPORTED AS NASAL SWAB   Respiratory Syncytial Virus A NOT DETECTED   Final   Respiratory Syncytial Virus B NOT DETECTED   Final   Influenza A NOT DETECTED   Final   Influenza B NOT DETECTED   Final   Parainfluenza 1 NOT DETECTED   Final   Parainfluenza 2 NOT DETECTED   Final    Parainfluenza 3 NOT DETECTED   Final   Metapneumovirus NOT DETECTED   Final   Rhinovirus NOT DETECTED   Final   Adenovirus NOT DETECTED  Final   Influenza A H1 NOT DETECTED   Final   Influenza A H3 NOT DETECTED   Final   Comment: (NOTE)           Normal Reference Range for each Analyte: NOT DETECTED     Testing performed using the Lum[employer] xTAG Respiratory Viral Panel test     kit.     The analytical performance characteristics of this assay have been     determined by [facility].  The modifications have not been     cleared or approved by the FDA. This assay has been validated pursuant     to the CLIA regulations and is used for clinical purposes.     Performed at [facility]     Scheduled Meds: . apixaban  5 mg Oral BID  . aztreonam  1 g Intravenous Q8H  . furosemide  40 mg Intravenous BID  . hydrocerin   Topical Daily  . losartan  50 mg Oral Daily  . metoprolol  50 mg Oral BID  . potassium chloride SA  20 mEq Oral Daily  . vancomycin  750 mg Intravenous Q12H  . vitamin E  1,000 Units Oral Daily   Continuous Infusions:    Eulogio Bear [facility] Pager [phone]  If 7PM-7AM, please contact night-coverage [email or web address] Password [email or web address] 10/15/2013, 8:16 AM

## 2013-10-19 NOTE — Progress Notes (Signed)
Pt ambulated 547ft on room air tolerated well no complaints of SOB. Returned to chair in room. Will continue to monitor patient.

## 2013-10-19 NOTE — Progress Notes (Signed)
SATURATION QUALIFICATIONS: (This note is used to comply with regulatory documentation for home oxygen)  Patient Saturations on Room Air at Rest = 94%  Patient Saturations on Room Air while Ambulating = 90%  Please briefly explain why patient needs home oxygen: Pt does not meet home oxygen needs.

## 2013-10-20 ENCOUNTER — Inpatient Hospital Stay (HOSPITAL_COMMUNITY): Payer: Medicare Other

## 2013-10-20 LAB — BASIC METABOLIC PANEL
Anion gap: 11 (ref 5–15)
BUN: 33 mg/dL — AB (ref 6–23)
CO2: 29 mEq/L (ref 19–32)
CREATININE: 0.64 mg/dL (ref 0.50–1.10)
Calcium: 8.4 mg/dL (ref 8.4–10.5)
Chloride: 94 mEq/L — ABNORMAL LOW (ref 96–112)
GFR calc Af Amer: >90 mL/min (ref >90)
GFR, EST NON AFRICAN AMERICAN: 79 mL/min — AB (ref >90)
GLUCOSE: 131 mg/dL — AB (ref 70–99)
Potassium: 4.8 mEq/L (ref 3.7–5.3)
Sodium: 134 mEq/L — ABNORMAL LOW (ref 137–147)

## 2013-10-20 MED ORDER — AZITHROMYCIN 500 MG PO TABS: 500.0000 mg | ORAL_TABLET | Freq: Every day | ORAL | Status: DC

## 2013-10-20 MED ORDER — METOPROLOL TARTRATE 12.5 MG HALF TABLET: 37.5000 mg | ORAL_TABLET | Freq: Two times a day (BID) | ORAL | Status: DC

## 2013-10-20 MED ORDER — PREDNISONE 20 MG PO TABS: 40.0000 mg | ORAL_TABLET | Freq: Every day | ORAL | Status: DC

## 2013-10-20 MED ORDER — DILTIAZEM HCL ER COATED BEADS 240 MG PO CP24: 240.0000 mg | ORAL_CAPSULE | Freq: Every day | ORAL | Status: DC

## 2013-10-20 MED ORDER — DILTIAZEM HCL ER COATED BEADS 240 MG PO CP24
240.0000 mg | ORAL_CAPSULE | Freq: Every day | ORAL | Status: DC
  Administered 2013-10-20: 240 mg via ORAL
  Filled 2013-10-20: qty 1

## 2013-10-20 MED ORDER — LEVALBUTEROL HCL 0.63 MG/3ML IN NEBU: 0.6300 mg | INHALATION_SOLUTION | RESPIRATORY_TRACT | Status: DC | PRN

## 2013-10-20 NOTE — Progress Notes (Signed)
Patient: Julie Haas / Admit Date: 10/11/2013 / Date of Encounter: 10/20/2013, 8:34 AM   Subjective: No chest pain, palpitations, SOB, or dyspnea. Wants to go home today.    Objective: Telemetry: A-fib, mostly 80s, several short episodes of RVR into the 120s this morning. 9/1 PM couple pauses of 2 seconds with the greatest being 2.8 seconds.  Physical Exam: Blood pressure 112/85, pulse 98, temperature 97.7 F (36.5 C), temperature source Oral, resp. rate 18, height 5\' 2"  (1.575 m), weight 205 lb 11 oz (93.3 kg), SpO2 93.00%. General: Well developed, well nourished, in no acute distress. Head: Normocephalic, atraumatic, sclera non-icteric, no xanthomas, nares are without discharge. Neck: Negative for carotid bruits. JVP not elevated. Lungs: Minimal basilar crackles. Breathing is unlabored. Heart: Irregular. S1 S2 without murmurs, rubs, or gallops.  Abdomen: Soft, non-tender, non-distended with normoactive bowel sounds. No rebound/guarding. Extremities: No clubbing or cyanosis. 2+ non pitting edema bilateral LE. Chronic venous stasis left leg. Neuro: Alert and oriented X 3. Moves all extremities spontaneously. Psych:  Responds to questions appropriately with a normal affect.   Intake/Output Summary (Last 24 hours) at 10/20/13 0834 Last data filed at 10/19/13 1700  Gross per 24 hour  Intake    423 ml  Output    351 ml  Net     72 ml    Inpatient Medications:  . apixaban  5 mg Oral BID  . azithromycin  500 mg Oral Daily  . diltiazem  60 mg Oral 4 times per day  . docusate sodium  100 mg Oral BID  . furosemide  40 mg Oral Daily  . hydrocerin   Topical Daily  . metoprolol  37.5 mg Oral BID  . predniSONE  40 mg Oral Q breakfast  . sodium chloride  3 mL Intravenous Q12H  . vitamin E  1,000 Units Oral Daily   Infusions:    Labs:  Recent Labs  10/19/13 0340 10/20/13 0436  NA 128* 134*  K 5.0 4.8  CL 92* 94*  CO2 27 29  GLUCOSE 119* 131*  BUN 31* 33*  CREATININE  0.62 0.64  CALCIUM 8.4 8.4   No results found for this basename: AST, ALT, ALKPHOS, BILITOT, PROT, ALBUMIN,  in the last 72 hours  Recent Labs  10/18/13 0425  WBC 17.1*  HGB 11.8*  HCT 35.1*  MCV 95.1  PLT 433*   No results found for this basename: CKTOTAL, CKMB, TROPONINI,  in the last 72 hours No components found with this basename: POCBNP,  No results found for this basename: HGBA1C,  in the last 72 hours   Radiology/Studies:  Dg Chest 2 View  10/20/2013   CLINICAL DATA:  Pneumonia  EXAM: CHEST  2 VIEW  COMPARISON:  PA and lateral chest of October 17, 2013  FINDINGS: The lungs are better inflated today. The interstitial markings remain coarse but the fluffy alveolar infiltrates are improving. There remain small bilateral pleural effusions. The cardiopericardial silhouette remains enlarged. The central pulmonary vascularity is prominent without significant cephalization. The bony thorax exhibits no acute abnormalities.  IMPRESSION: Improvement in the appearance of the pulmonary interstitium is consistent with ongoing resolution of clinical pneumonia. Small bilateral pleural effusions persist.   Electronically Signed   By: David  Martinique   On: 10/20/2013 07:46   Dg Chest 2 View  10/17/2013   CLINICAL DATA:  80 year old female shortness of Breath. Pneumonia. atrial fibrillation Initial encounter.  EXAM: CHEST  2 VIEW  COMPARISON:  10/15/2013 and  earlier.  FINDINGS: Portable AP semi upright view at 0711 hrs. Interval larger and more confluent bilateral nodular pulmonary opacities. As before, the left base is most heavily affected. Mild veiling opacity at both lung bases suggesting small effusions. Stable cardiac size and mediastinal contours. Visualized tracheal air column is within normal limits.  IMPRESSION: Radiographic progression of bilateral pneumonia, with larger more confluent bilateral opacities. Small effusions.   Electronically Signed   By: Lars Pinks M.D.   On: 10/17/2013 07:46   Dg  Chest 2 View  09/30/2013   EXAM: CHEST  2 VIEW  COMPARISON:  None.  FINDINGS: The heart size and mediastinal contours are within normal limits. Both lungs are clear. The visualized skeletal structures are unremarkable.  IMPRESSION: No active cardiopulmonary disease.   Electronically Signed   By: Marin Olp M.D.   On: 09/30/2013 08:04   Dg Chest 2 View  09/28/2013   CLINICAL DATA:  Cough  EXAM: CHEST  2 VIEW  COMPARISON:  09/27/2013  FINDINGS: Cardiac shadow is stable. Patchy infiltrates are again noted bilaterally. Increasing small left effusion is seen. No new focal abnormality is noted.  IMPRESSION: Patchy bilateral infiltrates.   Electronically Signed   By: Inez Catalina M.D.   On: 09/28/2013 11:09   Ct Chest Wo Contrast  10/11/2013   CLINICAL DATA:  Recurrent pneumonia.  EXAM: CT CHEST WITHOUT CONTRAST  TECHNIQUE: Multidetector CT imaging of the chest was performed following the standard protocol without IV contrast.  COMPARISON:  CT 01/23/2009 and series if recent chest radiographs August 2015.  FINDINGS: Lungs are adequately inflated demonstrate a very small amount of bilateral pleural fluid. There is a patchy bilateral airspace process with some nodularity worse over the right upper lobe. Minimal patchy bronchiectatic change over the lingula, posterior right upper lobe and left apex.  Mild cardiomegaly with a very small amount of pericardial fluid. There is moderate calcific plaque involving the left main, left and descending and lateral circumflex coronary arteries. Calcification over the mitral valve annulus with mild left atrial enlargement mild prominence of the main pulmonary arteries. There is a 1.3 cm pretracheal lymph node slightly larger. There is calcified plaque involving the thoracic aorta. Remaining mediastinal structures are unremarkable.  Images through the upper abdomen demonstrated 1.1 cm hypodensity over the left dome of the liver unchanged likely a cyst. Evidence of a prior  cholecystectomy. Remainder of the exam is unchanged  IMPRESSION: Patchy bilateral airspace process with nodularity most prominent over the right upper lobe likely due to infection. Patchy bronchiectatic change as described. Small bilateral pleural effusions. 1.3 cm pretracheal lymph node likely reactive. Recommend followup CT in 6-8 weeks to re-evaluate the areas of nodularity.  Cardiomegaly with calcification of the mitral valve annulus and left atrial enlargement. Atherosclerotic coronary artery disease. Small amount of pericardial fluid.  1.1 cm liver cyst unchanged.   Electronically Signed   By: Marin Olp M.D.   On: 10/11/2013 17:34   Dg Chest Port 1 View  10/15/2013   CLINICAL DATA:  Increased shortness of breath and cough.  EXAM: PORTABLE CHEST - 1 VIEW  COMPARISON:  10/11/2013  FINDINGS: Cardiac enlargement. Diffuse interstitial and alveolar parenchymal infiltrates, increasing since previous study. Changes may represent diffuse pneumonia or edema. Nodular infiltrates again demonstrated in the right upper lung. Blunting of the right costophrenic angle suggesting a developing right pleural effusion.  IMPRESSION: Increasing diffuse airspace and interstitial infiltrates in the lungs. Developing small right pleural effusion.   Electronically Signed  By: Lucienne Capers M.D.   On: 10/15/2013 01:21   Dg Chest Portable 1 View  10/11/2013   CLINICAL DATA:  Atrial fibrillation and tachycardia.  EXAM: PORTABLE CHEST - 1 VIEW  COMPARISON:  Chest radiograph of 09/30/2013.  CT of 01/23/2009  FINDINGS: Midline trachea. Moderate cardiomegaly. Chronic right-sided pleural thickening blunts the costophrenic angle. Possible small left pleural effusion. No pneumothorax. Interstitial thickening is chronic. Patchy right upper lobe peripheral opacity has been present back to 01/10/2012. There is also chronic right base pulmonary opacity. Left lower lobe pulmonary opacity may be progressive since 09/30/2013. Grossly  similar to 09/28/2013.  IMPRESSION: Cardiomegaly with chronic interstitial thickening. No overt congestive failure.  Multi focal pulmonary opacities. Primarily felt to represent areas of post infectious scarring. Cannot exclude progressive left lower lobe infection or aspiration.  Given chronicity, CT may be informative to re-evaluate the pulmonary opacities.   Electronically Signed   By: Abigail Miyamoto M.D.   On: 10/11/2013 13:33   Dg Chest Port 1 View  09/27/2013   CLINICAL DATA:  Shortness of breath.  EXAM: PORTABLE CHEST - 1 VIEW  COMPARISON:  Chest radiograph performed 09/26/2013  FINDINGS: The lungs are well-aerated. Vascular congestion is noted, with diffuse bilateral airspace opacities, possibly reflecting multifocal pneumonia or pulmonary edema. Small bilateral pleural effusions are seen. No pneumothorax is identified.  The cardiomediastinal silhouette is mildly enlarged. No acute osseous abnormalities are seen.  IMPRESSION: Vascular congestion and mild cardiomegaly, with diffuse bilateral airspace opacities, possibly reflecting multifocal pneumonia or pulmonary edema. Small bilateral pleural effusions seen.   Electronically Signed   By: Garald Balding M.D.   On: 09/27/2013 04:55   Dg Chest Port 1 View  09/26/2013   CLINICAL DATA:  Dyspnea.  Hypoxia.  EXAM: PORTABLE CHEST - 1 VIEW  COMPARISON:  Chest x-ray 09/24/2013.  FINDINGS: Patchy areas of interstitial prominence and multifocal airspace disease are seen scattered throughout the lungs bilaterally (right greater than left). Small bilateral pleural effusions. There does not appear to be cephalization of the pulmonary vasculature. Heart size appears borderline enlarged, accentuated by low lung volumes and lordotic positioning on this portable radiograph. Upper mediastinal contours are within normal limits allowing for these technique related limitations. Atherosclerosis in the thoracic aorta.  IMPRESSION: 1. Multifocal patchy asymmetrically distributed  interstitial and airspace disease throughout the lungs bilaterally (right greater than left), concerning for multilobar pneumonia. 2. Small bilateral pleural effusions. 3. Borderline cardiomegaly. 4. Atherosclerosis.   Electronically Signed   By: Vinnie Langton M.D.   On: 09/26/2013 17:06   Dg Chest Port 1 View  09/24/2013   CLINICAL DATA:  Shortness of breath.  EXAM: PORTABLE CHEST - 1 VIEW  COMPARISON:  01/10/2012.  FINDINGS: There is chronic cardiomegaly. Aortic atherosclerosis. No acute aortic contour abnormality.  There is diffuse interstitial coarsening which is increased from previous. Chronic patchy opacities in the bilateral lungs, most confluent in the right upper and right lower chest. Based on chest CT 01/23/2009, these are likely postinfectious scarring. No definitive effusion. No pneumothorax.  IMPRESSION: When accounting for chronic lung scarring (likely postinfectious), no definite edema or pneumonia.   Electronically Signed   By: Jorje Guild M.D.   On: 09/24/2013 02:11   Dg Swallowing Func-speech Pathology  10/16/2013   Assunta Curtis, CCC-SLP     10/16/2013 12:17 PM Objective Swallowing Evaluation: Modified Barium Swallowing Study   Patient Details  Name: PATRIC VANPELT MRN: [id number] Date of Birth: 1925-06-03  Today's Date: 10/16/2013 Time: 3382-5053  SLP Time Calculation (min): 15 min  Past Medical History:  Past Medical History  Diagnosis Date  . Hyperlipidemia   . Arrhythmia     PVC  . Retinal detachment   . Renal disorder   . Allergy   . Occlusion and stenosis of carotid artery without mention of  cerebral infarction 03/24/2012  . Pneumonia, organism unspecified 11/192013  . Depressive disorder, not elsewhere classified 03/05/2011  . Other premature beats 03/05/2011  . Other seborrheic keratosis 11/20/2010  . Syncope and collapse 10/02/2010  . Palpitations 11/21/2009  . Unspecified essential hypertension 06/27/2009  . Cardiomegaly 06/27/2009  . Other generalized ischemic  cerebrovascular disease 06/27/2009  . Dizziness and giddiness 06/27/2009  . Unspecified vitamin D deficiency 05/16/2009  . Family history of sudden cardiac death (SCD) 19-Dec-2008  . Rash and other nonspecific skin eruption 07/05/2008  . Edema 03/15/2008  . Obesity, unspecified 07/25/2006  . Varicose veins of lower extremities with inflammation 03/16/2003   . Shortness of breath    Past Surgical History:  Past Surgical History  Procedure Laterality Date  . Cholecystectomy  1983  . Cataract extraction Bilateral     Dr. [name]  . Tonsillectomy    . Keratosis    . Retinal detachment surgery Left   . Cardioversion N/A 10/01/2013    Procedure: CARDIOVERSION;  Surgeon: Sanda Klein, MD;   Location: [facility] ENDOSCOPY;  Service: Cardiovascular;  Laterality:  N/A;  . Tee without cardioversion N/A 10/01/2013    Procedure: TRANSESOPHAGEAL ECHOCARDIOGRAM (TEE);  Surgeon:  Sanda Klein, MD;  Location: [facility] ENDOSCOPY;  Service:  Cardiovascular;  Laterality: N/A;   HPI:  80yo female with dx of acute respiratory failure in the setting  of bilateral R>L PNA.  Hx HTN, AFib, CHF, recent admit for PNA  (d/c 8/15). Returned 8/24 with worsening SOB, cough, chills. CXR  revealed multifocal PNA and pt admitted by [facility]. Course has been  c/b AFib with RVR and acute on chronic dCHF. Respiratory status  worse overnight requiring NRB; now back on [place].  Swallow eval  ordered to r/o aspiration.       Assessment / Plan / Recommendation Clinical Impression  Dysphagia Diagnosis: Within Functional Limits Clinical impression: Pt presents with quite functional swallow  with adequate mastication, age-related swallow delays, mild  vallecular residue of solids, and consistent/reliable laryngeal  closure with all tested consistencies.  No penetration/aspiration  observed.  Esophageal sweep revealed barium stasis of solid  materials; liquid wash facilitated clearance.  Recommend  continued regular consistency diet, thin liquids - no SLP f/u  warranted.             Diet  Recommendation Regular;Thin liquid   Liquid Administration via: Cup;Straw Medication Administration: Whole meds with liquid Supervision: Patient able to self feed Compensations: Follow solids with liquid    Other  Recommendations Oral Care Recommendations: Oral care BID   Follow Up Recommendations  None           SLP Swallow Goals  n/a   General Date of Onset: 10/11/13 HPI: 80yo female with dx of acute respiratory failure in the  setting of bilateral R>L PNA.  Hx HTN, AFib, CHF, recent admit  for PNA (d/c 8/15). Returned 8/24 with worsening SOB, cough,  chills. CXR revealed multifocal PNA and pt admitted by [facility].  Course has been c/b AFib with RVR and acute on chronic dCHF.  Respiratory status worse overnight requiring NRB; now back on [place].   Swallow eval ordered to r/o aspiration.  Type of Study: Modified Barium Swallowing Study Reason for Referral: Objectively evaluate swallowing function Previous Swallow Assessment: bedside eval 10/15/13 Diet Prior to this Study: Regular;Thin liquids Temperature Spikes Noted: No Respiratory Status: Nasal cannula History of Recent Intubation: No Behavior/Cognition: Alert;Cooperative;Pleasant mood Oral Cavity - Dentition: Adequate natural dentition Oral Motor / Sensory Function: Within functional limits Self-Feeding Abilities: Able to feed self Patient Positioning: Upright in chair Baseline Vocal Quality: Breathy;Hoarse Volitional Cough: Strong Volitional Swallow: Able to elicit Anatomy: Within functional limits Pharyngeal Secretions: Not observed secondary MBS    Reason for Referral Objectively evaluate swallowing function   Oral Phase Oral Preparation/Oral Phase Oral Phase: WFL   Pharyngeal Phase Pharyngeal Phase Pharyngeal Phase: Impaired Pharyngeal - Thin Pharyngeal - Thin Cup: Delayed swallow initiation Pharyngeal - Solids Pharyngeal - Puree: Delayed swallow initiation;Pharyngeal residue  - valleculae  Cervical Esophageal Phase    GO   Amanda [address]. [place], [place] CCC/SLP Pager  [phone]  Cervical Esophageal Phase Cervical Esophageal Phase: Impaired Cervical Esophageal Phase - Solids Puree: Other (Comment) (barium stasis; clears with liquid wash)         [name] 10/16/2013, 12:16 PM      Assessment and Plan  80 y.o. female with h/o a-fib on [facility] (diagnosed last admission), HTN, HLD, and mod MS/mild-mod MR who [facility]11[facility]13 after recently being discharged 8/15 for a-fib with RVR s/p TEE/DCCV, and PNA. She came in 8/24 with worsening SOB, DOE, mild productive cough, and chills. She was found to have a-fib with RVR, acute diastolic CHF, and acute respiratory failure/HCAP.  1. A-fib with RVR:  -HR improved with rates mostly in the 80s  -Will consolidate to Cardizem 240 CD today -Continue Lopressor 37.5 mg bid  -On apixaban 5 mg bid   2. Acute diastolic CHF:  -Likely 2/2 rapid a-fib, which is being driven by underlying pulmonary process.  -Changed from IV Lasix 40 mg to PO Lasix 40 mg (home dose) on 9/1 -She has been sitting in the recliner chair for at least the past 24h 2/2 the bed not being comfortable w/o legs elevated for the most recent portion. She denies any SOB. Advised her to elevate her legs.  -Fluid restrict, follow sodium and renal function (currently stable 134/0.64) -Once PNA resolves consider resuming amiodarone followed by repeat DCCV 30 days later-Will leave this to Dr. Percival Spanish.   3. Acute respiratory failure:  -Improved-ambulated 500 ft 9/1 on RA without complaints  -2/2 HCAP and acute DCHF  -Currently on broad spectrum ABX with azithromycin, aztreonam, and vancomycin, along with IV steroids and nebulizer/inhaler therapy   4. Mild-moderate MR -Previous note 8/31 suggests MS, however echo/TEE reports only mild-moderate mitral regurg    Signed, Christell Faith, PA-C  10/20/2013 8:42 AM As above, patient seen and examined. She denies dyspnea, chest pain or palpitations. Her heart rate is controlled. Continue Cardizem and  metoprolol. Continue apixaban. Her volume status is improved. Continue Lasix at 40 mg daily. Followup with Dr. Percival Spanish in 2-4 weeks. If she is stable with rate control and anticoagulation then this would most likely be continued. If she deteriorates with worsening CHF then resumption of amiodarone with reattempt at cardioversion could be considered. She will need potassium and renal function checked one week following discharge. Please call with questions. Kirk Ruths

## 2013-10-20 NOTE — Progress Notes (Addendum)
10/20/13 1016  PT Visit Information  Last PT Received On 10/20/13  Assistance Needed +1  History of Present Illness pt presents with HCAP and recent admit for A-fib with RVR.    PT Time Calculation  PT Start Time 0917  PT Stop Time 0951  PT Time Calculation (min) 34 min  Subjective Data  Patient Stated Goal to g back to friends [place] today  Precautions  Precautions Fall  Restrictions  Weight Bearing Restrictions No  Pain Assessment  Pain Assessment No/denies pain  Cognition  Arousal/Alertness Awake/alert  Behavior During Therapy WFL for tasks assessed/performed  Overall Cognitive Status Within Functional Limits for tasks assessed  Bed Mobility  General bed mobility comments in recliner and returned to recliner   Transfers  Overall transfer level Needs assistance  Equipment used Rolling walker (2 wheeled)  Transfers Sit to/from Stand  Sit to Stand Supervision  General transfer comment supervision for cues for hand placement and safety; pt tends to forget to reach back for chair when sitting   Ambulation/Gait  Ambulation/Gait assistance Min guard  Ambulation Distance (Feet) 175 Feet  Assistive device Rolling walker (2 wheeled)  Gait Pattern/deviations Step-through pattern;Decreased stride length;Trunk flexed;Wide base of support  Gait velocity decreased   Gait velocity interpretation Below normal speed for age/gender  General Gait Details cues for upright posture and safety with RW; conversing throughout session; no SOBnoted  Balance  Overall balance assessment No apparent balance deficits (not formally assessed)  Standing balance support During functional activity;Bilateral upper extremity supported  Standing balance-Leahy Scale Poor  Standing balance comment relies on RW   General Comments  General comments (skin integrity, edema, etc.) discussed rehab goals and D/C planning  with pt and daughter; reviewed HEP   PT - End of Session  Equipment Utilized During  Treatment Gait belt  Activity Tolerance Patient tolerated treatment well  Patient left in chair;with call bell/phone within reach;with family/visitor present  Nurse Communication Mobility status  PT - Assessment/Plan  PT Plan Current plan remains appropriate  PT Frequency Min 3X/week  Follow Up Recommendations Home health PT  PT equipment None recommended by PT  PT Goal Progression  Progress towards PT goals Progressing toward goals  Acute Rehab PT Goals  PT Goal Formulation With patient  Time For Goal Achievement 10/21/13  Potential to Achieve Goals Good  PT General Charges  $$ ACUTE PT VISIT 1 Procedure  PT Treatments  $Gait Training 23-37 mins   Pt very motivated and eager to participate in therapy. Pt hopeful to D/C back to friends at [employer] today. Encouraged continued PT at [facility]. Will cont to follow per POC.   [place], [facility], [place]

## 2013-10-20 NOTE — Discharge Summary (Addendum)
Physician Discharge Summary  Julie Haas:[id number] DOB: Feb 21, 1925 DOA: 10/11/2013  PCP: Estill Dooms, MD  Admit date: 10/11/2013 Discharge date: 10/20/2013  Time spent: 35 minutes  Recommendations for Outpatient Follow-up:  recommendation CT chest repeated in 6-8 weeks to re-evaluate the areas of nodularity  Azithromycin for 3 more days Elevate extremities Daily weights Cbc, bmp 1 week Taper steroids slowly: 40 mg x 3 days, 30 mg x 3 days, 20 mg x 3 days, 10 mg x 3 days, 5 mg x 3 days PRN O2  Discharge Diagnoses:  Principal Problem:   HCAP (healthcare-associated pneumonia) Active Problems:   HTN (hypertension)   Atrial fibrillation   Acute diastolic heart failure   Hypoxemia   Acute lung injury   Acute respiratory failure with hypoxia   Discharge Condition: improved  Diet recommendation: cardiac  Filed Weights   10/18/13 0621 10/19/13 0553 10/20/13 0527  Weight: 94.3 kg (207 lb 14.3 oz) 94.5 kg (208 lb 5.4 oz) 93.3 kg (205 lb 11 oz)    History of present illness:  80 y.o. female with HTN, atrial fibrillation on AC, recent admission for PNA s/p TEE/DCCV (discharged on 8/15) and presented to [facility] ED with main concern of progressively worsening shortness of breath, mostly exertional but occasionally present at rest. This as need associated with productive cough of yellow sputum, subjective fevers, chills, LE edema and 2 pillow orthopnea    Hospital Course:  Acute hypoxic respiratory failure  - secondary to HCAP/CHF  Improving. Now on RA. MBS negative for aspiration  - recommendation CT chest repeated in 6-8 weeks to re-evaluate the areas of nodularity   HCAP (healthcare-associated pneumonia)  Day 8 vanc and aztreonam. Azithromycin for 3 more days   HTN (hypertension)  controlled   Atrial fibrillation with RVR  Rate better   Acute on chronic diastolic heart failure  Daily weights  Hypokalemia   repleted   Procedures:    Consultations:  Cardiology  pulmonary  Discharge Exam: Filed Vitals:   10/20/13 1133  BP: 142/78  Pulse:   Temp:   Resp:     General: A+Ox3, NAD Cardiovascular: irr Respiratory: coarse breath sounds  Discharge Instructions You were cared for by a hospitalist during your hospital stay. If you have any questions about your discharge medications or the care you received while you were in the hospital after you are discharged, you can call the unit and asked to speak with the hospitalist on call if the hospitalist that took care of you is not available. Once you are discharged, your primary care physician will handle any further medical issues. Please note that NO REFILLS for any discharge medications will be authorized once you are discharged, as it is imperative that you return to your primary care physician (or establish a relationship with a primary care physician if you do not have one) for your aftercare needs so that they can reassess your need for medications and monitor your lab values.      Discharge Instructions   (HEART FAILURE PATIENTS) Call MD:  Anytime you have any of the following symptoms: 1) 3 pound weight gain in 24 hours or 5 pounds in 1 week 2) shortness of breath, with or without a dry hacking cough 3) swelling in the hands, feet or stomach 4) if you have to sleep on extra pillows at night in order to breathe.    Complete by:  As directed      Diet - low sodium heart healthy  Complete by:  As directed      Discharge instructions    Complete by:  As directed   Cbc, bmp 1 week Daily weights  Elevate extremities Taper steroids slowly: 40 mg x 3 days, 30 mg x 3 days, 20 mg x 3 days, 10 mg x 3 days, 5 mg x 3 days     Increase activity slowly    Complete by:  As directed             Medication List    STOP taking these medications       levofloxacin 750 MG tablet  Commonly known as:  LEVAQUIN     losartan 50 MG tablet   Commonly known as:  COZAAR      TAKE these medications       apixaban 5 MG Tabs tablet  Commonly known as:  ELIQUIS  Take 1 tablet (5 mg total) by mouth 2 (two) times daily.     azithromycin 500 MG tablet  Commonly known as:  ZITHROMAX  Take 1 tablet (500 mg total) by mouth daily.     diltiazem 240 MG 24 hr capsule  Commonly known as:  CARDIZEM CD  Take 1 capsule (240 mg total) by mouth daily.     furosemide 40 MG tablet  Commonly known as:  LASIX  Take 1 tablet (40 mg total) by mouth daily.     levalbuterol 0.63 MG/3ML nebulizer solution  Commonly known as:  XOPENEX  Take 3 mLs (0.63 mg total) by nebulization every 4 (four) hours as needed for wheezing or shortness of breath.     metoprolol tartrate 12.5 mg Tabs tablet  Commonly known as:  LOPRESSOR  Take 1.5 tablets (37.5 mg total) by mouth 2 (two) times daily.     potassium chloride SA 20 MEQ tablet  Commonly known as:  K-DUR,KLOR-CON  Take 1 tablet (20 mEq total) by mouth daily.     predniSONE 20 MG tablet  Commonly known as:  DELTASONE  Take 2 tablets (40 mg total) by mouth daily with breakfast. With slow taper     vitamin E 1000 UNIT capsule  Take 1,000 Units by mouth daily.       Allergies  Allergen Reactions  . Codeine     Unknown  . Cucumber Extract Nausea And Vomiting  . Diuretic [Buchu-Cornsilk-Ch Grass-Hydran]     Unknown  . Lipitor [Atorvastatin Calcium]     Unknown  . Penicillins Other (See Comments)    unknown  . Pravachol     Unknown   Follow-up Information   Follow up with GREEN, Viviann Spare, MD In 1 week.   Specialty:  Internal Medicine   Contact information:   [place] [address] [phone]       Please follow up. ( 10/27/13 wed 10am with Rexene Edison NP for Dr Chase Caller)       Follow up with Minus Breeding, MD In 1 month.   Specialty:  Cardiology   Contact information:   [address] [place] [address] [phone]        The results  of significant diagnostics from this hospitalization (including imaging, microbiology, ancillary and laboratory) are listed below for reference.    Significant Diagnostic Studies: Dg Chest 2 View  10/20/2013   CLINICAL DATA:  Pneumonia  EXAM: CHEST  2 VIEW  COMPARISON:  PA and lateral chest of October 17, 2013  FINDINGS: The lungs are better inflated today. The  interstitial markings remain coarse but the fluffy alveolar infiltrates are improving. There remain small bilateral pleural effusions. The cardiopericardial silhouette remains enlarged. The central pulmonary vascularity is prominent without significant cephalization. The bony thorax exhibits no acute abnormalities.  IMPRESSION: Improvement in the appearance of the pulmonary interstitium is consistent with ongoing resolution of clinical pneumonia. Small bilateral pleural effusions persist.   Electronically Signed   By: David  Martinique   On: 10/20/2013 07:46   Dg Chest 2 View  10/17/2013   CLINICAL DATA:  80 year old female shortness of Breath. Pneumonia. atrial fibrillation Initial encounter.  EXAM: CHEST  2 VIEW  COMPARISON:  10/15/2013 and earlier.  FINDINGS: Portable AP semi upright view at 0711 hrs. Interval larger and more confluent bilateral nodular pulmonary opacities. As before, the left base is most heavily affected. Mild veiling opacity at both lung bases suggesting small effusions. Stable cardiac size and mediastinal contours. Visualized tracheal air column is within normal limits.  IMPRESSION: Radiographic progression of bilateral pneumonia, with larger more confluent bilateral opacities. Small effusions.   Electronically Signed   By: Lars Pinks M.D.   On: 10/17/2013 07:46   Dg Chest 2 View  09/30/2013   EXAM: CHEST  2 VIEW  COMPARISON:  None.  FINDINGS: The heart size and mediastinal contours are within normal limits. Both lungs are clear. The visualized skeletal structures are unremarkable.  IMPRESSION: No active cardiopulmonary disease.    Electronically Signed   By: Marin Olp M.D.   On: 09/30/2013 08:04   Dg Chest 2 View  09/28/2013   CLINICAL DATA:  Cough  EXAM: CHEST  2 VIEW  COMPARISON:  09/27/2013  FINDINGS: Cardiac shadow is stable. Patchy infiltrates are again noted bilaterally. Increasing small left effusion is seen. No new focal abnormality is noted.  IMPRESSION: Patchy bilateral infiltrates.   Electronically Signed   By: Inez Catalina M.D.   On: 09/28/2013 11:09   Ct Chest Wo Contrast  10/11/2013   CLINICAL DATA:  Recurrent pneumonia.  EXAM: CT CHEST WITHOUT CONTRAST  TECHNIQUE: Multidetector CT imaging of the chest was performed following the standard protocol without IV contrast.  COMPARISON:  CT 01/23/2009 and series if recent chest radiographs August 2015.  FINDINGS: Lungs are adequately inflated demonstrate a very small amount of bilateral pleural fluid. There is a patchy bilateral airspace process with some nodularity worse over the right upper lobe. Minimal patchy bronchiectatic change over the lingula, posterior right upper lobe and left apex.  Mild cardiomegaly with a very small amount of pericardial fluid. There is moderate calcific plaque involving the left main, left and descending and lateral circumflex coronary arteries. Calcification over the mitral valve annulus with mild left atrial enlargement mild prominence of the main pulmonary arteries. There is a 1.3 cm pretracheal lymph node slightly larger. There is calcified plaque involving the thoracic aorta. Remaining mediastinal structures are unremarkable.  Images through the upper abdomen demonstrated 1.1 cm hypodensity over the left dome of the liver unchanged likely a cyst. Evidence of a prior cholecystectomy. Remainder of the exam is unchanged  IMPRESSION: Patchy bilateral airspace process with nodularity most prominent over the right upper lobe likely due to infection. Patchy bronchiectatic change as described. Small bilateral pleural effusions. 1.3 cm pretracheal  lymph node likely reactive. Recommend followup CT in 6-8 weeks to re-evaluate the areas of nodularity.  Cardiomegaly with calcification of the mitral valve annulus and left atrial enlargement. Atherosclerotic coronary artery disease. Small amount of pericardial fluid.  1.1 cm liver cyst unchanged.  Electronically Signed   By: Marin Olp M.D.   On: 10/11/2013 17:34   Dg Chest Port 1 View  10/15/2013   CLINICAL DATA:  Increased shortness of breath and cough.  EXAM: PORTABLE CHEST - 1 VIEW  COMPARISON:  10/11/2013  FINDINGS: Cardiac enlargement. Diffuse interstitial and alveolar parenchymal infiltrates, increasing since previous study. Changes may represent diffuse pneumonia or edema. Nodular infiltrates again demonstrated in the right upper lung. Blunting of the right costophrenic angle suggesting a developing right pleural effusion.  IMPRESSION: Increasing diffuse airspace and interstitial infiltrates in the lungs. Developing small right pleural effusion.   Electronically Signed   By: Lucienne Capers M.D.   On: 10/15/2013 01:21   Dg Chest Portable 1 View  10/11/2013   CLINICAL DATA:  Atrial fibrillation and tachycardia.  EXAM: PORTABLE CHEST - 1 VIEW  COMPARISON:  Chest radiograph of 09/30/2013.  CT of 01/23/2009  FINDINGS: Midline trachea. Moderate cardiomegaly. Chronic right-sided pleural thickening blunts the costophrenic angle. Possible small left pleural effusion. No pneumothorax. Interstitial thickening is chronic. Patchy right upper lobe peripheral opacity has been present back to 01/10/2012. There is also chronic right base pulmonary opacity. Left lower lobe pulmonary opacity may be progressive since 09/30/2013. Grossly similar to 09/28/2013.  IMPRESSION: Cardiomegaly with chronic interstitial thickening. No overt congestive failure.  Multi focal pulmonary opacities. Primarily felt to represent areas of post infectious scarring. Cannot exclude progressive left lower lobe infection or aspiration.   Given chronicity, CT may be informative to re-evaluate the pulmonary opacities.   Electronically Signed   By: Abigail Miyamoto M.D.   On: 10/11/2013 13:33   Dg Chest Port 1 View  09/27/2013   CLINICAL DATA:  Shortness of breath.  EXAM: PORTABLE CHEST - 1 VIEW  COMPARISON:  Chest radiograph performed 09/26/2013  FINDINGS: The lungs are well-aerated. Vascular congestion is noted, with diffuse bilateral airspace opacities, possibly reflecting multifocal pneumonia or pulmonary edema. Small bilateral pleural effusions are seen. No pneumothorax is identified.  The cardiomediastinal silhouette is mildly enlarged. No acute osseous abnormalities are seen.  IMPRESSION: Vascular congestion and mild cardiomegaly, with diffuse bilateral airspace opacities, possibly reflecting multifocal pneumonia or pulmonary edema. Small bilateral pleural effusions seen.   Electronically Signed   By: Garald Balding M.D.   On: 09/27/2013 04:55   Dg Chest Port 1 View  09/26/2013   CLINICAL DATA:  Dyspnea.  Hypoxia.  EXAM: PORTABLE CHEST - 1 VIEW  COMPARISON:  Chest x-ray 09/24/2013.  FINDINGS: Patchy areas of interstitial prominence and multifocal airspace disease are seen scattered throughout the lungs bilaterally (right greater than left). Small bilateral pleural effusions. There does not appear to be cephalization of the pulmonary vasculature. Heart size appears borderline enlarged, accentuated by low lung volumes and lordotic positioning on this portable radiograph. Upper mediastinal contours are within normal limits allowing for these technique related limitations. Atherosclerosis in the thoracic aorta.  IMPRESSION: 1. Multifocal patchy asymmetrically distributed interstitial and airspace disease throughout the lungs bilaterally (right greater than left), concerning for multilobar pneumonia. 2. Small bilateral pleural effusions. 3. Borderline cardiomegaly. 4. Atherosclerosis.   Electronically Signed   By: Vinnie Langton M.D.   On:  09/26/2013 17:06   Dg Chest Port 1 View  09/24/2013   CLINICAL DATA:  Shortness of breath.  EXAM: PORTABLE CHEST - 1 VIEW  COMPARISON:  01/10/2012.  FINDINGS: There is chronic cardiomegaly. Aortic atherosclerosis. No acute aortic contour abnormality.  There is diffuse interstitial coarsening which is increased from previous. Chronic patchy opacities  in the bilateral lungs, most confluent in the right upper and right lower chest. Based on chest CT 01/23/2009, these are likely postinfectious scarring. No definitive effusion. No pneumothorax.  IMPRESSION: When accounting for chronic lung scarring (likely postinfectious), no definite edema or pneumonia.   Electronically Signed   By: Jorje Guild M.D.   On: 09/24/2013 02:11   Dg Swallowing Func-speech Pathology  10/16/2013   Assunta Curtis, CCC-SLP     10/16/2013 12:17 PM Objective Swallowing Evaluation: Modified Barium Swallowing Study   Patient Details  Name: Julie Haas MRN: [id number] Date of Birth: [birth year]  Today's Date: 10/16/2013 Time: 1145-1200 SLP Time Calculation (min): 15 min  Past Medical History:  Past Medical History  Diagnosis Date  . Hyperlipidemia   . Arrhythmia     PVC  . Retinal detachment   . Renal disorder   . Allergy   . Occlusion and stenosis of carotid artery without mention of  cerebral infarction 03/24/2012  . Pneumonia, organism unspecified 11/192013  . Depressive disorder, not elsewhere classified 03/05/2011  . Other premature beats 03/05/2011  . Other seborrheic keratosis 11/20/2010  . Syncope and collapse 10/02/2010  . Palpitations 11/21/2009  . Unspecified essential hypertension 06/27/2009  . Cardiomegaly 06/27/2009  . Other generalized ischemic cerebrovascular disease 06/27/2009  . Dizziness and giddiness 06/27/2009  . Unspecified vitamin D deficiency 05/16/2009  . Family history of sudden cardiac death (SCD) 12/23/2008  . Rash and other nonspecific skin eruption 07/05/2008  . Edema 03/15/2008  . Obesity, unspecified 07/25/2006  .  Varicose veins of lower extremities with inflammation 03/16/2003   . Shortness of breath    Past Surgical History:  Past Surgical History  Procedure Laterality Date  . Cholecystectomy  1983  . Cataract extraction Bilateral     Dr. [name]  . Tonsillectomy    . Keratosis    . Retinal detachment surgery Left   . Cardioversion N/A 10/01/2013    Procedure: CARDIOVERSION;  Surgeon: Sanda Klein, MD;   Location: [facility] ENDOSCOPY;  Service: Cardiovascular;  Laterality:  N/A;  . Tee without cardioversion N/A 10/01/2013    Procedure: TRANSESOPHAGEAL ECHOCARDIOGRAM (TEE);  Surgeon:  Sanda Klein, MD;  Location: [facility] ENDOSCOPY;  Service:  Cardiovascular;  Laterality: N/A;   HPI:  80yo female with dx of acute respiratory failure in the setting  of bilateral R>L PNA.  Hx HTN, AFib, CHF, recent admit for PNA  (d/c 8/15). Returned 8/24 with worsening SOB, cough, chills. CXR  revealed multifocal PNA and pt admitted by [facility]. Course has been  c/b AFib with RVR and acute on chronic dCHF. Respiratory status  worse overnight requiring NRB; now back on [place].  Swallow eval  ordered to r/o aspiration.       Assessment / Plan / Recommendation Clinical Impression  Dysphagia Diagnosis: Within Functional Limits Clinical impression: Pt presents with quite functional swallow  with adequate mastication, age-related swallow delays, mild  vallecular residue of solids, and consistent/reliable laryngeal  closure with all tested consistencies.  No penetration/aspiration  observed.  Esophageal sweep revealed barium stasis of solid  materials; liquid wash facilitated clearance.  Recommend  continued regular consistency diet, thin liquids - no SLP f/u  warranted.             Diet Recommendation Regular;Thin liquid   Liquid Administration via: Cup;Straw Medication Administration: Whole meds with liquid Supervision: Patient able to self feed Compensations: Follow solids with liquid    Other  Recommendations Oral Care Recommendations: Oral care BID  Follow Up  Recommendations  None           SLP Swallow Goals  n/a   General Date of Onset: 10/11/13 HPI: 80yo female with dx of acute respiratory failure in the  setting of bilateral R>L PNA.  Hx HTN, AFib, CHF, recent admit  for PNA (d/c 8/15). Returned 8/24 with worsening SOB, cough,  chills. CXR revealed multifocal PNA and pt admitted by [facility].  Course has been c/b AFib with RVR and acute on chronic dCHF.  Respiratory status worse overnight requiring NRB; now back on [place].   Swallow eval ordered to r/o aspiration.   Type of Study: Modified Barium Swallowing Study Reason for Referral: Objectively evaluate swallowing function Previous Swallow Assessment: bedside eval 10/15/13 Diet Prior to this Study: Regular;Thin liquids Temperature Spikes Noted: No Respiratory Status: Nasal cannula History of Recent Intubation: No Behavior/Cognition: Alert;Cooperative;Pleasant mood Oral Cavity - Dentition: Adequate natural dentition Oral Motor / Sensory Function: Within functional limits Self-Feeding Abilities: Able to feed self Patient Positioning: Upright in chair Baseline Vocal Quality: Breathy;Hoarse Volitional Cough: Strong Volitional Swallow: Able to elicit Anatomy: Within functional limits Pharyngeal Secretions: Not observed secondary MBS    Reason for Referral Objectively evaluate swallowing function   Oral Phase Oral Preparation/Oral Phase Oral Phase: WFL   Pharyngeal Phase Pharyngeal Phase Pharyngeal Phase: Impaired Pharyngeal - Thin Pharyngeal - Thin Cup: Delayed swallow initiation Pharyngeal - Solids Pharyngeal - Puree: Delayed swallow initiation;Pharyngeal residue  - valleculae  Cervical Esophageal Phase    GO   [facility] [facility], [place] CCC/SLP Pager [phone]  Cervical Esophageal Phase Cervical Esophageal Phase: Impaired Cervical Esophageal Phase - Solids Puree: Other (Comment) (barium stasis; clears with liquid wash)         [name] 10/16/2013, 12:16 PM     Microbiology: Recent Results (from the past 240 hour(s))   URINE CULTURE     Status: None   Collection Time    10/11/13  1:25 PM      Result Value Ref Range Status   Specimen Description URINE, CLEAN CATCH   Final   Special Requests NONE   Final   Culture  Setup Time     Final   Value: 10/11/2013 20:09     Performed at [place]     Final   Value: 70,000 COLONIES/ML     Performed at [facility]   Culture     Final   Value: STAPHYLOCOCCUS SPECIES (COAGULASE NEGATIVE)     Note: RIFAMPIN AND GENTAMICIN SHOULD NOT BE USED AS SINGLE DRUGS FOR TREATMENT OF STAPH INFECTIONS.     Performed at [facility]   Report Status 10/15/2013 FINAL   Final   Organism ID, Bacteria STAPHYLOCOCCUS SPECIES (COAGULASE NEGATIVE)   Final  CULTURE, BLOOD (ROUTINE X 2)     Status: None   Collection Time    10/11/13  7:33 PM      Result Value Ref Range Status   Specimen Description BLOOD RIGHT ARM   Final   Special Requests BOTTLES DRAWN AEROBIC AND ANAEROBIC 10CC   Final   Culture  Setup Time     Final   Value: 10/12/2013 04:12     Performed at [facility]   Culture     Final   Value: NO GROWTH 5 DAYS     Performed at [facility]   Report Status 10/18/2013 FINAL   Final  CULTURE, BLOOD (ROUTINE X 2)  Status: None   Collection Time    10/11/13  7:43 PM      Result Value Ref Range Status   Specimen Description BLOOD LEFT ARM   Final   Special Requests BOTTLES DRAWN AEROBIC AND ANAEROBIC 5CC   Final   Culture  Setup Time     Final   Value: 10/12/2013 04:13     Performed at [facility]   Culture     Final   Value: NO GROWTH 5 DAYS     Performed at [facility]   Report Status 10/18/2013 FINAL   Final  RESPIRATORY VIRUS PANEL     Status: None   Collection Time    10/14/13  4:50 PM      Result Value Ref Range Status   Source - [facility] NASAL SWAB   Corrected   Comment: CORRECTED ON 08/28 AT 2020: PREVIOUSLY REPORTED AS NASAL SWAB   Respiratory Syncytial Virus A NOT DETECTED    Final   Respiratory Syncytial Virus B NOT DETECTED   Final   Influenza A NOT DETECTED   Final   Influenza B NOT DETECTED   Final   Parainfluenza 1 NOT DETECTED   Final   Parainfluenza 2 NOT DETECTED   Final   Parainfluenza 3 NOT DETECTED   Final   Metapneumovirus NOT DETECTED   Final   Rhinovirus NOT DETECTED   Final   Adenovirus NOT DETECTED   Final   Influenza A H1 NOT DETECTED   Final   Influenza A H3 NOT DETECTED   Final   Comment: (NOTE)           Normal Reference Range for each Analyte: NOT DETECTED     Testing performed using the Luminex xTAG Respiratory Viral Panel test     kit.     The analytical performance characteristics of this assay have been     determined by [facility].  The modifications have not been     cleared or approved by the FDA. This assay has been validated pursuant     to the CLIA regulations and is used for clinical purposes.     Performed at [facility]: Basic Metabolic Panel:  Recent Labs Lab 10/15/13 0534 10/18/13 0425 10/18/13 1118 10/19/13 0340 10/20/13 0436  NA 135* 128* 129* 128* 134*  K 4.5 6.2* 5.3 5.0 4.8  CL 95* 90* 92* 92* 94*  CO2 _0 GLUCOSE 153* 124* 119* 119* 131*  BUN 12 30* 28* 31* 33*  CREATININE 0.57 0.66 0.55 0.62 0.64  CALCIUM 8.3* 8.8 8.7 8.4 8.4   Liver Function Tests: No results found for this basename: AST, ALT, ALKPHOS, BILITOT, PROT, ALBUMIN,  in the last 168 hours No results found for this basename: LIPASE, AMYLASE,  in the last 168 hours No results found for this basename: AMMONIA,  in the last 168 hours CBC:  Recent Labs Lab 10/15/13 0534 10/18/13 0425  WBC 9.6 17.1*  HGB 12.4 11.8*  HCT 38.3 35.1*  MCV 96.7 95.1  PLT 376 433*   Cardiac Enzymes: No results found for this basename: CKTOTAL, CKMB, CKMBINDEX, TROPONINI,  in the last 168 hours BNP: BNP (last 3 results)  Recent Labs  10/11/13 1203 10/14/13 1220 10/15/13 0534  PROBNP 2942.0* 2071.0* 2137.0*    CBG: No results found for this basename: GLUCAP,  in the last 168 hours     Signed:  Eliseo Squires, JESSICA  [facility] 10/20/2013, 12:49 PM

## 2013-10-20 NOTE — Progress Notes (Signed)
Pt given AVS handout and verbalized understanding of discharge instructions. Pt in stable condition prior to discharge; no changes to assessment were identified. Pt IV was d'cd. Pt tolerated well. Pt escorted out with NT via wheelchair.

## 2013-10-20 NOTE — Clinical Social Work Note (Signed)
Clinical Social Worker facilitated patient discharge including contacting patient family and facility to confirm patient discharge plans.  Clinical information faxed to facility and family agreeable with plan.  CSW arranged transport via patient daughter to [facility].  RN to call report prior to discharge.  Clinical Social Worker will sign off for now as social work intervention is no longer needed. Please consult [place] again if new need arises.  Barbette Or, Jet

## 2013-10-22 ENCOUNTER — Encounter: Payer: Self-pay | Admitting: Nurse Practitioner

## 2013-10-22 ENCOUNTER — Non-Acute Institutional Stay (SKILLED_NURSING_FACILITY): Payer: Medicare Other | Admitting: Nurse Practitioner

## 2013-10-22 DIAGNOSIS — J9600 Acute respiratory failure, unspecified whether with hypoxia or hypercapnia: Secondary | ICD-10-CM

## 2013-10-22 DIAGNOSIS — S81802S Unspecified open wound, left lower leg, sequela: Secondary | ICD-10-CM

## 2013-10-22 DIAGNOSIS — J9601 Acute respiratory failure with hypoxia: Secondary | ICD-10-CM

## 2013-10-22 DIAGNOSIS — J189 Pneumonia, unspecified organism: Secondary | ICD-10-CM

## 2013-10-22 DIAGNOSIS — I10 Essential (primary) hypertension: Secondary | ICD-10-CM

## 2013-10-22 DIAGNOSIS — R911 Solitary pulmonary nodule: Secondary | ICD-10-CM

## 2013-10-22 DIAGNOSIS — IMO0002 Reserved for concepts with insufficient information to code with codable children: Secondary | ICD-10-CM

## 2013-10-22 DIAGNOSIS — I480 Paroxysmal atrial fibrillation: Secondary | ICD-10-CM

## 2013-10-22 DIAGNOSIS — I4891 Unspecified atrial fibrillation: Secondary | ICD-10-CM

## 2013-10-22 DIAGNOSIS — I5031 Acute diastolic (congestive) heart failure: Secondary | ICD-10-CM

## 2013-10-22 NOTE — Progress Notes (Signed)
Patient ID: Julie Haas, female   DOB: June 05, 1925, 80 y.o.   MRN: [id number]    Code Status: DNR, living will.   Allergies  Allergen Reactions  . Codeine     Unknown  . Cucumber Extract Nausea And Vomiting  . Diuretic [Buchu-Cornsilk-Ch Grass-Hydran]     Unknown  . Lipitor [Atorvastatin Calcium]     Unknown  . Penicillins Other (See Comments)    unknown  . Pravachol     Unknown    Chief Complaint  Patient presents with  . Medical Management of Chronic Issues  . Acute Visit    left leg pain, O2 desaturation.     HPI: Patient is a 80 y.o. female seen in the SNF at [facility] today for evaluation of left leg pain,  hospital f/u,  and other chronic medical conditions.     Hospitalized 10/11/2013-10/20/2013 for HCAP (healthcare-associated pneumonia) Atrial fibrillation Acute diastolic heart failure Hypoxemia Acute respiratory failure with hypoxia.  The patient had recent admission to hospital for PNA s/p TEE/DCCV (discharged on 8/15) and presented to [facility] ED with main concern of progressively worsening shortness of breath, mostly exertional but occasionally present at rest. Associated with productive cough of yellow sputum, subjective fevers, chills, LE edema and 2 pillow orthopnea      Hospitalized 09/24/2013-10/02/2013 for Acute diastolic heart failure HTN (hypertension) Hyperlipidemia Wound of left leg(since Nov 2014-f/u [place]) Bilateral moderate carotid disease by doppler Atrial fibrillation-conversion 10/01/13 Mild aortic stenosis with normal LVF Pneumonia(Levaquin 750mg  daily) Acute resp failure     Presented ED with SOB. She had a prior history of PVCs but no confirmed AF per her recollection. She was noted in AF with rates in the >150 range. She was given diltiazem en route. In the ED, she was given additional diltiazem and placed on an infusion with better control of her HR in the 110s-120s. She was admitted to hospital for AF-RVR and continue on IV dilt. IV  heparin added but later changed to Eliquis. She was given 20mg  of IV lasix for volume overload. She ultimately diuresed -14L net. TEE/DCCV was completed on Aug 14. Fortaz and vancomycin started Monday, Aug 10 for PNA. WBC decreased. She was changed to PO Levaquin 750 mg a day x 5 days(dc 10/07/13)   Problem List Items Addressed This Visit   HTN (hypertension) (Chronic)     Controlled. Takes Furosemide 40mg  qd, Diltiazem 240mg , and Metoprolol 12.5mg  am and 37.5mg  pm     Wound of left leg (Chronic)     Left lower leg since Nov 2014-currently compression wrap-seen at [facility]. C/o pain, no redness or warmth. Declined Tylenol for pain.      Atrial fibrillation     New onset. Had cardioversion. HR is better controlled. Takes Metoprolol 12.5mg  am, 37.5mg  pm, and Diltiazem 240mg . Eliquis 5mg  bid for PE/DVT risk reduction.      Acute diastolic heart failure     Weight daily, continue Furosemide, f/u Cardiology.     HCAP (healthcare-associated pneumonia)     Azithromycin for 3 more days  Elevate extremities  Daily weights  Cbc, bmp 1 week  Taper steroids slowly: 40 mg x 3 days, 30 mg x 3 days, 20 mg x 3 days, 10 mg x 3 days, 5 mg x 3 days  PRN O2      Acute respiratory failure with hypoxia     secondary to HCAP/CHF  Upon discharge: Improving. Now on RA. MBS negative for  aspiration  Worse O2 Sat 76-78% exertional-requires O2 to bring O2 sat 88-93%-f/u Pulmonology. Continue prn Xopenex.      Lung nodule - Primary     recommendation CT chest repeated in 6-8 weeks to re-evaluate the areas of nodularity         Review of Systems:  Review of Systems  Constitutional: Negative for fever, chills, weight loss, malaise/fatigue and diaphoresis.  HENT: Positive for hearing loss. Negative for congestion, ear discharge, ear pain, nosebleeds, sore throat and tinnitus.   Eyes: Negative for blurred vision, double vision, photophobia, pain, discharge and redness.  Respiratory:  Positive for cough. Negative for hemoptysis, sputum production, shortness of breath, wheezing and stridor.        Hacking  Cardiovascular: Positive for leg swelling and PND. Negative for chest pain, palpitations, orthopnea and claudication.       BLE L>R  Gastrointestinal: Negative for heartburn, nausea, vomiting, abdominal pain, diarrhea, constipation, blood in stool and melena.  Genitourinary: Negative for dysuria, urgency, frequency, hematuria and flank pain.  Musculoskeletal: Negative for back pain, falls, joint pain, myalgias and neck pain.       No further hematuria since Foley Catheter removed.   Skin: Negative for itching and rash.       Left lower leg wound since Nov 2014-f/u [place] weekly.   Neurological: Negative for dizziness, tingling, tremors, sensory change, speech change, focal weakness, seizures, loss of consciousness, weakness and headaches.  Endo/Heme/Allergies: Negative for environmental allergies and polydipsia. Does not bruise/bleed easily.  Psychiatric/Behavioral: Negative for depression, suicidal ideas, hallucinations, memory loss and substance abuse. The patient is not nervous/anxious and does not have insomnia.      Past Medical History  Diagnosis Date  . Hyperlipidemia   . Arrhythmia     PVC  . Retinal detachment   . Renal disorder   . Allergy   . Occlusion and stenosis of carotid artery without mention of cerebral infarction 03/24/2012  . Pneumonia, organism unspecified 11/192013  . Depressive disorder, not elsewhere classified 03/05/2011  . Other premature beats 03/05/2011  . Other seborrheic keratosis 11/20/2010  . Syncope and collapse 10/02/2010  . Palpitations 11/21/2009  . Unspecified essential hypertension 06/27/2009  . Cardiomegaly 06/27/2009  . Other generalized ischemic cerebrovascular disease 06/27/2009  . Dizziness and giddiness 06/27/2009  . Unspecified vitamin D deficiency 05/16/2009  . Family history of sudden cardiac death (SCD) 2008-12-06   . Rash and other nonspecific skin eruption 07/05/2008  . Edema 03/15/2008  . Obesity, unspecified 07/25/2006  . Varicose veins of lower extremities with inflammation 03/16/2003  . Shortness of breath    Past Surgical History  Procedure Laterality Date  . Cholecystectomy  1983  . Cataract extraction Bilateral     Dr. [name]  . Tonsillectomy    . Keratosis    . Retinal detachment surgery Left   . Cardioversion N/A 10/01/2013    Procedure: CARDIOVERSION;  Surgeon: Sanda Klein, MD;  Location: [facility] ENDOSCOPY;  Service: Cardiovascular;  Laterality: N/A;  . Tee without cardioversion N/A 10/01/2013    Procedure: TRANSESOPHAGEAL ECHOCARDIOGRAM (TEE);  Surgeon: Sanda Klein, MD;  Location: [facility] ENDOSCOPY;  Service: Cardiovascular;  Laterality: N/A;   Social History:   reports that she has never smoked. She has never used smokeless tobacco. She reports that she does not drink alcohol or use illicit drugs.  Family History  Problem Relation Age of Onset  . Arrhythmia      Long QT in first degree relatives,  . Heart attack  Father   . Heart disease Father   . Heart attack Brother   . Heart disease Brother   . Pneumonia Mother     Medications: Patient's Medications  New Prescriptions   No medications on file  Previous Medications   APIXABAN (ELIQUIS) 5 MG TABS TABLET    Take 1 tablet (5 mg total) by mouth 2 (two) times daily.   DILTIAZEM (CARDIZEM CD) 240 MG 24 HR CAPSULE    Take 1 capsule (240 mg total) by mouth daily.   LEVALBUTEROL (XOPENEX) 0.63 MG/3ML NEBULIZER SOLUTION    Take 3 mLs (0.63 mg total) by nebulization every 4 (four) hours as needed for wheezing or shortness of breath.   METOPROLOL (LOPRESSOR) 12.5 MG TABS TABLET    Take 1.5 tablets (37.5 mg total) by mouth 2 (two) times daily.   OXYGEN    Inhale 2 L into the lungs daily.   PREDNISONE (DELTASONE) 20 MG TABLET    Take 2 tablets (40 mg total) by mouth daily with breakfast. With slow taper   VITAMIN E 1000 UNIT CAPSULE    Take  1,000 Units by mouth daily.  Modified Medications   Modified Medication Previous Medication   FUROSEMIDE (LASIX) 40 MG TABLET furosemide (LASIX) 40 MG tablet      Take 1 tablet (40 mg total) by mouth 2 (two) times daily.    Take 1 tablet (40 mg total) by mouth daily.   POTASSIUM CHLORIDE SA (K-DUR,KLOR-CON) 20 MEQ TABLET potassium chloride SA (K-DUR,KLOR-CON) 20 MEQ tablet      Take 1 tablet (20 mEq total) by mouth 2 (two) times daily.    Take 1 tablet (20 mEq total) by mouth daily.  Discontinued Medications   AZITHROMYCIN (ZITHROMAX) 500 MG TABLET    Take 1 tablet (500 mg total) by mouth daily.     Physical Exam: Physical Exam  Constitutional:  overwewight  HENT:  Right Ear: External ear normal.  Left Ear: External ear normal.  Nose: Nose normal.  Eyes:  Corrective lenses.  Neck: Normal range of motion. Neck supple. No JVD present. No tracheal deviation present. No thyromegaly present.  Cardiovascular: Intact distal pulses.  Exam reveals no gallop and no friction rub.   Murmur heard. Varicose leg veins. Trace edema LLE>RLE  Pulmonary/Chest: No respiratory distress. She has no wheezes. She has rales (bronchial rattle).  Moist rales posterior lower lungs.   Abdominal: Soft. Bowel sounds are normal. She exhibits no distension and no mass. There is no tenderness.  Musculoskeletal: Normal range of motion. She exhibits edema. She exhibits no tenderness.  Trace BLE L>R  Lymphadenopathy:    She has no cervical adenopathy.  Neurological: She is alert. No cranial nerve deficit. Coordination normal.  Skin: No rash noted. No erythema. No pallor.  Open area is about 63mm by 14 mm. Some swelling of the leg above this. Small hematoma at superior end of the injury.  Psychiatric: She has a normal mood and affect. Her behavior is normal. Judgment and thought content normal.    Filed Vitals:   10/22/13 1418  BP: 136/88  Pulse: 96  Temp: 97.7 F (36.5 C)  TempSrc: Tympanic  Resp: 18       Labs reviewed: Basic Metabolic Panel:  Recent Labs  09/24/13 0834  10/12/13 0404  10/18/13 1118 10/19/13 0340 10/20/13 0436  NA  --   < > 137  < > 129* 128* 134*  K  --   < > 3.5*  < > 5.3  5.0 4.8  CL  --   < > 95*  < > 92* 92* 94*  CO2  --   < > 27  < > 23 27 29   GLUCOSE  --   < > 92  < > 119* 119* 131*  BUN  --   < > 13  < > 28* 31* 33*  CREATININE  --   < > 0.60  < > 0.55 0.62 0.64  CALCIUM  --   < > 7.9*  < > 8.7 8.4 8.4  MG  --   --  1.7  --   --   --   --   TSH 2.580  --   --   --   --   --   --   < > = values in this interval not displayed. Liver Function Tests:  Recent Labs  03/22/13 09/24/13 0200  AST 17 44*  ALT 9 39*  ALKPHOS 88 83  BILITOT  --  0.5  PROT  --  8.0  ALBUMIN  --  3.7   No results found for this basename: LIPASE, AMYLASE,  in the last 8760 hours No results found for this basename: AMMONIA,  in the last 8760 hours CBC:  Recent Labs  09/28/13 0348  10/11/13 1243  10/15/13 0534 10/18/13 0425 10/29/13 1625  WBC 9.6  < > 7.8  < > 9.6 17.1* 16.1*  NEUTROABS 7.0  --  5.5  --   --   --  14.6*  HGB 11.8*  < > 11.8*  < > 12.4 11.8* 11.6*  HCT 34.8*  < > 36.2  < > 38.3 35.1* 34.9*  MCV 92.6  < > 92.8  < > 96.7 95.1 93.9  PLT 299  < > 337  < > 376 433* 301.0  < > = values in this interval not displayed. Lipid Panel:  Recent Labs  03/22/13 09/25/13 0321  CHOL 229* 191  HDL 63 86  LDLCALC 148 95  TRIG 92 48  CHOLHDL  --  2.2    Past Procedures:  09/30/13 CXR  IMPRESSION: No active cardiopulmonary disease.  10/01/13   Echocardiogram transesophageal                             LV EF: 55% -   60%        Assessment/Plan Lung nodule recommendation CT chest repeated in 6-8 weeks to re-evaluate the areas of nodularity    HCAP (healthcare-associated pneumonia) Azithromycin for 3 more days  Elevate extremities  Daily weights  Cbc, bmp 1 week  Taper steroids slowly: 40 mg x 3 days, 30 mg x 3 days, 20 mg x 3 days, 10 mg  x 3 days, 5 mg x 3 days  PRN O2    Acute respiratory failure with hypoxia secondary to HCAP/CHF  Upon discharge: Improving. Now on RA. MBS negative for aspiration  Worse O2 Sat 76-78% exertional-requires O2 to bring O2 sat 88-93%-f/u Pulmonology. Continue prn Xopenex.    Wound of left leg Left lower leg since Nov 2014-currently compression wrap-seen at [facility] weekly. C/o pain, no redness or warmth. Declined Tylenol for pain.    HTN (hypertension) Controlled. Takes Furosemide 40mg  qd, Diltiazem 240mg , and Metoprolol 12.5mg  am and 37.5mg  pm   Atrial fibrillation New onset. Had cardioversion. HR is better controlled. Takes Metoprolol 12.5mg  am, 37.5mg  pm, and Diltiazem 240mg . Eliquis 5mg  bid for PE/DVT risk  reduction.    Acute diastolic heart failure Weight daily, continue Furosemide, f/u Cardiology.     Family/ Staff Communication: observe the patient.   Goals of Care: IL  Labs/tests ordered: CBC and BMP

## 2013-10-26 ENCOUNTER — Telehealth: Payer: Self-pay | Admitting: Internal Medicine

## 2013-10-26 NOTE — Telephone Encounter (Signed)
Called pt son. He reports to [facility] at [facility] provided to arrange for HFU i called and LMTCB x1 for nurse to arrange HFU

## 2013-10-26 NOTE — Telephone Encounter (Signed)
Please advise about HFU appt.  [facility] needs to be called to arrange this.

## 2013-10-27 ENCOUNTER — Inpatient Hospital Stay: Payer: Medicare Other | Admitting: Adult Health

## 2013-10-27 NOTE — Telephone Encounter (Signed)
Appt scheduled for HFU with TP 11/09/13 at 9am Nothing further needed.

## 2013-10-28 ENCOUNTER — Telehealth: Payer: Self-pay | Admitting: Internal Medicine

## 2013-10-28 NOTE — Telephone Encounter (Signed)
Called pt. appt r/s. Nothing further needed 

## 2013-10-29 ENCOUNTER — Encounter: Payer: Self-pay | Admitting: Internal Medicine

## 2013-10-29 ENCOUNTER — Ambulatory Visit (INDEPENDENT_AMBULATORY_CARE_PROVIDER_SITE_OTHER): Payer: Medicare Other | Admitting: Physician Assistant

## 2013-10-29 ENCOUNTER — Other Ambulatory Visit (INDEPENDENT_AMBULATORY_CARE_PROVIDER_SITE_OTHER): Payer: Medicare Other

## 2013-10-29 ENCOUNTER — Ambulatory Visit (INDEPENDENT_AMBULATORY_CARE_PROVIDER_SITE_OTHER): Payer: Medicare Other | Admitting: Adult Health

## 2013-10-29 ENCOUNTER — Encounter: Payer: Self-pay | Admitting: Physician Assistant

## 2013-10-29 ENCOUNTER — Encounter: Payer: Self-pay | Admitting: Adult Health

## 2013-10-29 ENCOUNTER — Ambulatory Visit (INDEPENDENT_AMBULATORY_CARE_PROVIDER_SITE_OTHER)
Admission: RE | Admit: 2013-10-29 | Discharge: 2013-10-29 | Disposition: A | Discharge status: Home / Self Care | Payer: Medicare Other | Source: Ambulatory Visit | Attending: Adult Health | Admitting: Adult Health

## 2013-10-29 VITALS — BP 122/64 | HR 110 | Temp 97.8°F | Ht 64.0 in | Wt 208.4 lb

## 2013-10-29 VITALS — BP 128/81 | HR 86 | Ht 64.0 in | Wt 207.8 lb

## 2013-10-29 DIAGNOSIS — R0600 Dyspnea, unspecified: Secondary | ICD-10-CM

## 2013-10-29 DIAGNOSIS — I5031 Acute diastolic (congestive) heart failure: Secondary | ICD-10-CM

## 2013-10-29 DIAGNOSIS — I4891 Unspecified atrial fibrillation: Secondary | ICD-10-CM | POA: Diagnosis not present

## 2013-10-29 DIAGNOSIS — I10 Essential (primary) hypertension: Secondary | ICD-10-CM

## 2013-10-29 DIAGNOSIS — R0609 Other forms of dyspnea: Secondary | ICD-10-CM

## 2013-10-29 DIAGNOSIS — I6529 Occlusion and stenosis of unspecified carotid artery: Secondary | ICD-10-CM

## 2013-10-29 DIAGNOSIS — J189 Pneumonia, unspecified organism: Secondary | ICD-10-CM

## 2013-10-29 DIAGNOSIS — R0989 Other specified symptoms and signs involving the circulatory and respiratory systems: Secondary | ICD-10-CM

## 2013-10-29 DIAGNOSIS — I4819 Other persistent atrial fibrillation: Secondary | ICD-10-CM

## 2013-10-29 LAB — CBC WITH DIFFERENTIAL/PLATELET
BASOS ABS: 0 10*3/uL (ref 0.0–0.1)
Basophils Relative: 0.3 % (ref 0.0–3.0)
EOS ABS: 0 10*3/uL (ref 0.0–0.7)
Eosinophils Relative: 0.1 % (ref 0.0–5.0)
HEMATOCRIT: 34.9 % — AB (ref 36.0–46.0)
HEMOGLOBIN: 11.6 g/dL — AB (ref 12.0–15.0)
LYMPHS ABS: 0.8 10*3/uL (ref 0.7–4.0)
Lymphocytes Relative: 4.9 % — ABNORMAL LOW (ref 12.0–46.0)
MCHC: 33.2 g/dL (ref 30.0–36.0)
MCV: 93.9 fl (ref 78.0–100.0)
MONO ABS: 0.6 10*3/uL (ref 0.1–1.0)
Monocytes Relative: 3.6 % (ref 3.0–12.0)
NEUTROS ABS: 14.6 10*3/uL — AB (ref 1.4–7.7)
Neutrophils Relative %: 91.1 % — ABNORMAL HIGH (ref 43.0–77.0)
Platelets: 301 10*3/uL (ref 150.0–400.0)
RBC: 3.72 Mil/uL — ABNORMAL LOW (ref 3.87–5.11)
RDW: 14.8 % (ref 11.5–15.5)
WBC: 16.1 10*3/uL — ABNORMAL HIGH (ref 4.0–10.5)

## 2013-10-29 LAB — BRAIN NATRIURETIC PEPTIDE: Pro B Natriuretic peptide (BNP): 444 pg/mL — ABNORMAL HIGH (ref 0.0–100.0)

## 2013-10-29 LAB — SEDIMENTATION RATE: Sed Rate: 75 mm/hr — ABNORMAL HIGH (ref 0–22)

## 2013-10-29 MED ORDER — FUROSEMIDE 40 MG PO TABS
40.0000 mg | ORAL_TABLET | Freq: Two times a day (BID) | ORAL | Status: DC
Start: 2013-10-29 — End: 2013-11-02

## 2013-10-29 MED ORDER — POTASSIUM CHLORIDE CRYS ER 20 MEQ PO TBCR: 20.0000 meq | EXTENDED_RELEASE_TABLET | Freq: Two times a day (BID) | ORAL | Status: DC

## 2013-10-29 NOTE — Progress Notes (Signed)
Subjective:    Patient ID: Julie Haas, female    DOB: Jun 14, 1925, 80 y.o.   MRN: [id number]  HPI  10/29/2013 [facility] follow up  80 yo female NH pt at [facility] -PCP Dr. Nyoka Cowden previous in [place] admitted 8/24 -9/2 for HCAP, Acute hypoxic RF and possible [name]  complicated by AFib RVR, acute CHF .Seen for pulmonary consult during admission . Pt was treated with aggressive abx, IV diuresis ,  MBS was neg for aspiration. She reports several bouts of PNA in past.  She says she is improving since discharge. Remains on O2. Currently on slow taper of steroids. Concern for [name] with CT showing bilateral aspdx w /elevated ESR ~85 .  Seen by cardiology this am w/ worsening edema and wt gain.Lasix was increased with close follow up ov planned.  CXR today shows worsening aeration with multifocal interstitial /airspace opacities on Right ? Edema . O2 sats are good on O2. She denies fever or discolored mucus  Appetite is good.    Review of Systems Constitutional:   No  weight loss, night sweats,  Fevers, chills,  +fatigue, or  lassitude.  HEENT:   No headaches,  Difficulty swallowing,  Tooth/dental problems, or  Sore throat,                No sneezing, itching, ear ache, + nasal congestion, post nasal drip,   CV:  No chest pain,  Orthopnea, PND,  ++swelling in lower extremities,  No anasarca, dizziness, palpitations, syncope.   GI  No heartburn, indigestion, abdominal pain, nausea, vomiting, diarrhea, change in bowel habits, loss of appetite, bloody stools.   Resp: +shortness of breath with exertion or at rest.  No excess mucus, no productive cough,  No non-productive cough,  No coughing up of blood.  No change in color of mucus.  No wheezing.  No chest wall deformity  Skin: no rash or lesions.  GU: no dysuria, change in color of urine, no urgency or frequency.  No flank pain, no hematuria   MS:  No joint pain or swelling.  No decreased range of motion.  No back  pain.  Psych:  No change in mood or affect. No depression or anxiety.  No memory loss.         Objective:   Physical Exam GEN: A/Ox3; pleasant , NAD, elderly , chronically ill appearing in wheelchair on O2   HEENT:  [place]/AT,  EACs-clear, TMs-wnl, NOSE-clear, THROAT-clear, no lesions, no postnasal drip or exudate noted.   NECK:  Supple w/ fair ROM; no JVD; normal carotid impulses w/o bruits; no thyromegaly or nodules palpated; no lymphadenopathy.  RESP  Bibasilar crackles , no accessory muscle use, no dullness to percussion  CARD:  RRR, no m/r/g  , 2+  peripheral edema, pulses intact, no cyanosis or clubbing.  GI:   Soft & nt; nml bowel sounds; no organomegaly or masses detected.  Musco: Warm bil, no deformities or joint swelling noted.   Neuro: alert, no focal deficits noted.    Skin: Warm, no lesions or rashes    10/29/2013 CXR >Slight interval progression of multifocal irregular patchy  interstitial and airspace opacities predominantly throughout the  right lung compared to 10/20/2013. The interval change may reflect  slightly increased pulmonary vascular congestion/mild edema  superimposed on the underlying presumed pneumonia. Alternately, this  could represent progressive pleural parenchymal scarring. A  neoplastic process is considered significantly less likely given the  relatively normal appearance of  the lung fields on 09/24/2013.  2. Stable cardiomegaly.  3. Slightly enlarged bilateral layering pleural effusions with  associated bibasilar atelectasis.       Assessment & Plan:

## 2013-10-29 NOTE — Assessment & Plan Note (Signed)
Blood pressure well controlled

## 2013-10-29 NOTE — Assessment & Plan Note (Addendum)
Patient continues to have 3+ lower extremity edema and rales on exam her weight is available but from prior weight. Increase her Lasix to 40 mg twice daily and increased her potassium. I've asked that she continue daily weights the nurse [facility] should call me on Monday with her weight. We'll make adjustments at that time. She'll also follow up with me in approximately 10 days.  She is wearing compression socks which she started yesterday

## 2013-10-29 NOTE — Assessment & Plan Note (Addendum)
Clinically improving however cxr is worse suspect is representative of fluid status w/  volume overload . Along w/ [name]  She appears clinically stable w/ good sats and no increased wob.  For now will repeat ESR /BNP and check wbc  No further abx at this time  Close follow up with 1 week cxr  Cont w/  Cards rec for increased diuresis   Plan  Finish Prednisone as directed.  Remain on Oxygen 2l/m for now  Take increased Lasix dose from cardiology as directed.  follow up with Dr. Lamonte Sakai in 1 week with chest xray .  Please contact office for sooner follow up if symptoms do not improve or worsen or seek emergency care

## 2013-10-29 NOTE — Progress Notes (Deleted)
Patient ID: Julie Haas, female   DOB: 10/15/1925, 80 y.o.   MRN: [id number]  Provider:  Rexene Edison. Mariea Clonts, D.O., C.M.D.  Location:    PCP: Estill Dooms, MD  Code Status: ***  Allergies  Allergen Reactions  . Codeine     Unknown  . Cucumber Extract Nausea And Vomiting  . Diuretic [Buchu-Cornsilk-Ch Grass-Hydran]     Unknown  . Lipitor [Atorvastatin Calcium]     Unknown  . Penicillins Other (See Comments)    unknown  . Pravachol     Unknown    Chief Complaint  Patient presents with  . Readmit To SNF    HPI: 80 y.o. female ***  ROS: *** Past Medical History  Diagnosis Date  . Hyperlipidemia   . Arrhythmia     PVC  . Retinal detachment   . Renal disorder   . Allergy   . Occlusion and stenosis of carotid artery without mention of cerebral infarction 03/24/2012  . Pneumonia, organism unspecified 11/192013  . Depressive disorder, not elsewhere classified 03/05/2011  . Other premature beats 03/05/2011  . Other seborrheic keratosis 11/20/2010  . Syncope and collapse 10/02/2010  . Palpitations 11/21/2009  . Unspecified essential hypertension 06/27/2009  . Cardiomegaly 06/27/2009  . Other generalized ischemic cerebrovascular disease 06/27/2009  . Dizziness and giddiness 06/27/2009  . Unspecified vitamin D deficiency 05/16/2009  . Family history of sudden cardiac death (SCD) December 27, 2008  . Rash and other nonspecific skin eruption 07/05/2008  . Edema 03/15/2008  . Obesity, unspecified 07/25/2006  . Varicose veins of lower extremities with inflammation 03/16/2003  . Shortness of breath    Past Surgical History  Procedure Laterality Date  . Cholecystectomy  1983  . Cataract extraction Bilateral     Dr. [name]  . Tonsillectomy    . Keratosis    . Retinal detachment surgery Left   . Cardioversion N/A 10/01/2013    Procedure: CARDIOVERSION;  Surgeon: Sanda Klein, MD;  Location: [facility] ENDOSCOPY;  Service: Cardiovascular;  Laterality: N/A;  . Tee without cardioversion N/A  10/01/2013    Procedure: TRANSESOPHAGEAL ECHOCARDIOGRAM (TEE);  Surgeon: Sanda Klein, MD;  Location: [facility] ENDOSCOPY;  Service: Cardiovascular;  Laterality: N/A;   Social History:   reports that she has never smoked. She has never used smokeless tobacco. She reports that she does not drink alcohol or use illicit drugs.  Family History  Problem Relation Age of Onset  . Arrhythmia      Long QT in first degree relatives,  . Heart attack Father   . Heart disease Father   . Heart attack Brother   . Heart disease Brother   . Pneumonia Mother     Medications: Patient's Medications  New Prescriptions   No medications on file  Previous Medications   APIXABAN (ELIQUIS) 5 MG TABS TABLET    Take 1 tablet (5 mg total) by mouth 2 (two) times daily.   AZITHROMYCIN (ZITHROMAX) 500 MG TABLET    Take 1 tablet (500 mg total) by mouth daily.   DILTIAZEM (CARDIZEM CD) 240 MG 24 HR CAPSULE    Take 1 capsule (240 mg total) by mouth daily.   FUROSEMIDE (LASIX) 40 MG TABLET    Take 1 tablet (40 mg total) by mouth daily.   LEVALBUTEROL (XOPENEX) 0.63 MG/3ML NEBULIZER SOLUTION    Take 3 mLs (0.63 mg total) by nebulization every 4 (four) hours as needed for wheezing or shortness of breath.   METOPROLOL (LOPRESSOR) 12.5 MG TABS TABLET  Take 1.5 tablets (37.5 mg total) by mouth 2 (two) times daily.   POTASSIUM CHLORIDE SA (K-DUR,KLOR-CON) 20 MEQ TABLET    Take 1 tablet (20 mEq total) by mouth daily.   PREDNISONE (DELTASONE) 20 MG TABLET    Take 2 tablets (40 mg total) by mouth daily with breakfast. With slow taper   VITAMIN E 1000 UNIT CAPSULE    Take 1,000 Units by mouth daily.  Modified Medications   No medications on file  Discontinued Medications   No medications on file     Physical Exam: There were no vitals filed for this visit. ***  Labs reviewed: Basic Metabolic Panel:  Recent Labs  10/12/13 0404  10/18/13 1118 10/19/13 0340 10/20/13 0436  NA 137  < > 129* 128* 134*  K 3.5*  < > 5.3  5.0 4.8  CL 95*  < > 92* 92* 94*  CO2 27  < > 23 27 29   GLUCOSE 92  < > 119* 119* 131*  BUN 13  < > 28* 31* 33*  CREATININE 0.60  < > 0.55 0.62 0.64  CALCIUM 7.9*  < > 8.7 8.4 8.4  MG 1.7  --   --   --   --   < > = values in this interval not displayed. Liver Function Tests:  Recent Labs  03/22/13 09/24/13 0200  AST 17 44*  ALT 9 39*  ALKPHOS 88 83  BILITOT  --  0.5  PROT  --  8.0  ALBUMIN  --  3.7   No results found for this basename: LIPASE, AMYLASE,  in the last 8760 hours No results found for this basename: AMMONIA,  in the last 8760 hours CBC:  Recent Labs  09/27/13 0512 09/28/13 0348  10/11/13 1243  10/13/13 0515 10/15/13 0534 10/18/13 0425  WBC 11.5* 9.6  < > 7.8  < > 6.8 9.6 17.1*  NEUTROABS 9.1* 7.0  --  5.5  --   --   --   --   HGB 13.7 11.8*  < > 11.8*  < > 10.9* 12.4 11.8*  HCT 39.5 34.8*  < > 36.2  < > 32.7* 38.3 35.1*  MCV 92.1 92.6  < > 92.8  < > 92.6 96.7 95.1  PLT 327 299  < > 337  < > 293 376 433*  < > = values in this interval not displayed. Cardiac Enzymes:  Recent Labs  10/11/13 1943 10/12/13 0126 10/12/13 0940  TROPONINI <0.30 <0.30 <0.30   BNP: No components found with this basename: POCBNP,  CBG: No results found for this basename: GLUCAP,  in the last 8760 hours  Imaging and Procedures:  Assessment/Plan No problem-specific assessment & plan notes found for this encounter.   Functional status:  Family/ staff Communication:   Labs/tests ordered:    This encounter was created in error - please disregard.

## 2013-10-29 NOTE — Patient Instructions (Signed)
Increase Lasix to 40 mg twice daily.  Take at 8 AM and 6 PM Increase potassium to 20 mEq twice daily Please call in Monday with patient's weight Follow up in 1 week with PA/NP

## 2013-10-29 NOTE — Progress Notes (Signed)
Date:  10/29/2013   ID:  [name], DOB 11-18-1925, MRN [id number]  PCP:  Estill Dooms, MD  Primary Cardiologist:  Hochrein    History of Present Illness: CARLEENA MIRES is a 80 y.o. female with PMH of HTN, HPL, CHF, A Fib on AC, admitted 08/07-08/15 by cardiology service for a fib RVR, pneumonia. S/p TEE/DCCV.  Echocardiogram on August 7 revealed an ejection fraction of 55-60% with mild LVH.   She presented 08/24 with worsening SOB, DOE, mild productive cough, chills.  She was treated for pneumonia and cardiology was consult for CHF and atrial fibrillation. Her rate was well controlled on metoprolol and anticoagulated with Eliquis.  Patient presents today for posthospital evaluation wearing oxygen. She reports her breathing is about the same.  She started wearing compression socks yesterday. She still has considerable lower extremity edema.  She denies PND but has orthopnea.   She currently denies nausea, vomiting, fever, chest pain, dizziness, cough, congestion, abdominal pain, hematochezia, melena.  Wt Readings from Last 3 Encounters:  10/29/13 207 lb 12.8 oz (94.257 kg)  10/20/13 205 lb 11 oz (93.3 kg)  10/02/13 203 lb 0.7 oz (92.1 kg)     Past Medical History  Diagnosis Date  . Hyperlipidemia   . Arrhythmia     PVC  . Retinal detachment   . Renal disorder   . Allergy   . Occlusion and stenosis of carotid artery without mention of cerebral infarction 03/24/2012  . Pneumonia, organism unspecified 11/192013  . Depressive disorder, not elsewhere classified 03/05/2011  . Other premature beats 03/05/2011  . Other seborrheic keratosis 11/20/2010  . Syncope and collapse 10/02/2010  . Palpitations 11/21/2009  . Unspecified essential hypertension 06/27/2009  . Cardiomegaly 06/27/2009  . Other generalized ischemic cerebrovascular disease 06/27/2009  . Dizziness and giddiness 06/27/2009  . Unspecified vitamin D deficiency 05/16/2009  . Family history of sudden cardiac death (SCD)  12/07/08  . Rash and other nonspecific skin eruption 07/05/2008  . Edema 03/15/2008  . Obesity, unspecified 07/25/2006  . Varicose veins of lower extremities with inflammation 03/16/2003  . Shortness of breath     Current Outpatient Prescriptions  Medication Sig Dispense Refill  . apixaban (ELIQUIS) 5 MG TABS tablet Take 1 tablet (5 mg total) by mouth 2 (two) times daily.  60 tablet  11  . diltiazem (CARDIZEM CD) 240 MG 24 hr capsule Take 1 capsule (240 mg total) by mouth daily.      . furosemide (LASIX) 40 MG tablet Take 1 tablet (40 mg total) by mouth 2 (two) times daily.  60 tablet  5  . levalbuterol (XOPENEX) 0.63 MG/3ML nebulizer solution Take 3 mLs (0.63 mg total) by nebulization every 4 (four) hours as needed for wheezing or shortness of breath.  3 mL  12  . metoprolol (LOPRESSOR) 12.5 mg TABS tablet Take 1.5 tablets (37.5 mg total) by mouth 2 (two) times daily.      . OXYGEN Inhale 2 L into the lungs daily.      . potassium chloride SA (K-DUR,KLOR-CON) 20 MEQ tablet Take 1 tablet (20 mEq total) by mouth 2 (two) times daily.  60 tablet  5  . predniSONE (DELTASONE) 20 MG tablet Take 2 tablets (40 mg total) by mouth daily with breakfast. With slow taper      . vitamin E 1000 UNIT capsule Take 1,000 Units by mouth daily.       No current facility-administered medications for this visit.  Allergies:    Allergies  Allergen Reactions  . Codeine     Unknown  . Cucumber Extract Nausea And Vomiting  . Diuretic [Buchu-Cornsilk-Ch Grass-Hydran]     Unknown  . Lipitor [Atorvastatin Calcium]     Unknown  . Penicillins Other (See Comments)    unknown  . Pravachol     Unknown    Social History:  The patient  reports that she has never smoked. She has never used smokeless tobacco. She reports that she does not drink alcohol or use illicit drugs.   Family history:   Family History  Problem Relation Age of Onset  . Arrhythmia      Long QT in first degree relatives,  . Heart attack  Father   . Heart disease Father   . Heart attack Brother   . Heart disease Brother   . Pneumonia Mother     ROS:  Please see the history of present illness.  All other systems reviewed and negative.   PHYSICAL EXAM: VS:  BP 128/81  Pulse 86  Ht 5\' 4"  (1.626 m)  Wt 207 lb 12.8 oz (94.257 kg)  BMI 35.65 kg/m2 obese, well developed, in no acute distress HEENT: Pupils are equal round react to light accommodation extraocular movements are intact.  Neck: No cervical lymphadenopathy. Cardiac: Irregular rate and rhythm with 1/6 systolic murmur Lungs:  Bilateral rales.  No wheeze Abd: soft, nontender, positive bowel sounds all quadrants,  Ext: 3+ lower extremity edema.  2+ radial and 1+dorsalis pedis pulses. Skin: warm and dry Neuro:  Grossly normal  EKG:  Atrial fibrillation 86 beats per minute   ASSESSMENT AND PLAN:  Problem List Items Addressed This Visit   HTN (hypertension) (Chronic)     Blood pressure well controlled.    Relevant Medications      furosemide (LASIX) tablet   Atrial fibrillation - Primary     Currently rate controlled.    Relevant Medications      furosemide (LASIX) tablet   Other Relevant Orders      EKG 81-WEXH   Acute diastolic heart failure     Patient continues to have 3+ lower extremity edema and rales on exam her weight is available but from prior weight. Increase her Lasix to 40 mg twice daily and increased her potassium. I've asked that she continue daily weights the nurse from a friend's home should call me on Monday with her weight. We'll make adjustments at that time. She'll also follow up with me in approximately 10 days.  She is wearing compression socks which she started yesterday    Relevant Medications      furosemide (LASIX) tablet

## 2013-10-29 NOTE — Assessment & Plan Note (Signed)
Currently rate controlled 

## 2013-10-29 NOTE — Patient Instructions (Signed)
Finish Prednisone as directed.  Remain on Oxygen 2l/m for now  Take increased Lasix dose from cardiology as directed.  follow up with Dr. Lamonte Sakai in 1 week with chest xray .  Please contact office for sooner follow up if symptoms do not improve or worsen or seek emergency care

## 2013-10-30 ENCOUNTER — Encounter: Payer: Self-pay | Admitting: Nurse Practitioner

## 2013-10-30 DIAGNOSIS — R911 Solitary pulmonary nodule: Secondary | ICD-10-CM | POA: Insufficient documentation

## 2013-10-30 NOTE — Assessment & Plan Note (Signed)
Azithromycin for 3 more days  Elevate extremities  Daily weights  Cbc, bmp 1 week  Taper steroids slowly: 40 mg x 3 days, 30 mg x 3 days, 20 mg x 3 days, 10 mg x 3 days, 5 mg x 3 days  PRN O2

## 2013-10-30 NOTE — Assessment & Plan Note (Signed)
secondary to HCAP/CHF  Upon discharge: Improving. Now on RA. MBS negative for aspiration  Worse O2 Sat 76-78% exertional-requires O2 to bring O2 sat 88-93%-f/u Pulmonology. Continue prn Xopenex.

## 2013-10-30 NOTE — Assessment & Plan Note (Signed)
Left lower leg since Nov 2014-currently compression wrap-seen at [facility] weekly. C/o pain, no redness or warmth. Declined Tylenol for pain.

## 2013-10-30 NOTE — Assessment & Plan Note (Signed)
recommendation CT chest repeated in 6-8 weeks to re-evaluate the areas of nodularity

## 2013-10-30 NOTE — Assessment & Plan Note (Signed)
Controlled. Takes Furosemide 40mg  qd, Diltiazem 240mg , and Metoprolol 12.5mg  am and 37.5mg  pm

## 2013-10-30 NOTE — Assessment & Plan Note (Signed)
Weight daily, continue Furosemide, f/u Cardiology.

## 2013-10-30 NOTE — Assessment & Plan Note (Signed)
New onset. Had cardioversion. HR is better controlled. Takes Metoprolol 12.5mg  am, 37.5mg  pm, and Diltiazem 240mg . Eliquis 5mg  bid for PE/DVT risk reduction.

## 2013-11-01 ENCOUNTER — Encounter: Payer: Self-pay | Admitting: Cardiology

## 2013-11-01 ENCOUNTER — Telehealth: Payer: Self-pay | Admitting: Physician Assistant

## 2013-11-01 NOTE — Telephone Encounter (Signed)
Spoke with [name], they called to reports the patients weight, Friday in our office she was 207 lbs Saturday= 209 lbs Sunday=211 lbs  Today= 209 lbs  Will make brian hager pac aware

## 2013-11-01 NOTE — Telephone Encounter (Signed)
Increase lasix to 80mg  twice daily.  Continue daily weights.  Take another 40mg  this morning.  She should have only taken 40mg  already.   BMET on Wed.   Tarri Fuller, [facility]

## 2013-11-01 NOTE — Telephone Encounter (Signed)
Verbal orders given to [facility] at [facility]. This note also faxed to [phone]

## 2013-11-02 ENCOUNTER — Non-Acute Institutional Stay (SKILLED_NURSING_FACILITY): Payer: Medicare Other | Admitting: Nurse Practitioner

## 2013-11-02 ENCOUNTER — Encounter: Payer: Self-pay | Admitting: Nurse Practitioner

## 2013-11-02 ENCOUNTER — Encounter: Payer: Medicare Other | Admitting: Internal Medicine

## 2013-11-02 DIAGNOSIS — J189 Pneumonia, unspecified organism: Secondary | ICD-10-CM

## 2013-11-02 DIAGNOSIS — I482 Chronic atrial fibrillation, unspecified: Secondary | ICD-10-CM

## 2013-11-02 DIAGNOSIS — I4891 Unspecified atrial fibrillation: Secondary | ICD-10-CM

## 2013-11-02 DIAGNOSIS — S81802S Unspecified open wound, left lower leg, sequela: Secondary | ICD-10-CM

## 2013-11-02 DIAGNOSIS — J9601 Acute respiratory failure with hypoxia: Secondary | ICD-10-CM

## 2013-11-02 DIAGNOSIS — IMO0002 Reserved for concepts with insufficient information to code with codable children: Secondary | ICD-10-CM

## 2013-11-02 DIAGNOSIS — I10 Essential (primary) hypertension: Secondary | ICD-10-CM

## 2013-11-02 DIAGNOSIS — R911 Solitary pulmonary nodule: Secondary | ICD-10-CM

## 2013-11-02 DIAGNOSIS — I5031 Acute diastolic (congestive) heart failure: Secondary | ICD-10-CM

## 2013-11-02 DIAGNOSIS — J9600 Acute respiratory failure, unspecified whether with hypoxia or hypercapnia: Secondary | ICD-10-CM

## 2013-11-02 NOTE — Progress Notes (Signed)
Patient ID: Julie Haas, female   DOB: Oct 07, 1925, 80 y.o.   MRN: [id number]    Code Status: DNR, living will.   Allergies  Allergen Reactions  . Codeine     Unknown  . Cucumber Extract Nausea And Vomiting  . Diuretic [Buchu-Cornsilk-Ch Grass-Hydran]     Unknown  . Lipitor [Atorvastatin Calcium]     Unknown  . Penicillins Other (See Comments)    unknown  . Pravachol     Unknown    Chief Complaint  Patient presents with  . Medical Management of Chronic Issues  . Acute Visit    Afib RVR    HPI: Patient is a 80 y.o. female seen in the SNF at [facility] today for evaluation of Afib RVR and other chronic medical conditions.     Hospitalized 10/11/2013-10/20/2013 for HCAP (healthcare-associated pneumonia) Atrial fibrillation Acute diastolic heart failure Hypoxemia Acute respiratory failure with hypoxia.  The patient had recent admission to hospital for PNA s/p TEE/DCCV (discharged on 8/15) and presented to [facility] ED with main concern of progressively worsening shortness of breath, mostly exertional but occasionally present at rest. Associated with productive cough of yellow sputum, subjective fevers, chills, LE edema and 2 pillow orthopnea      Hospitalized 09/24/2013-10/02/2013 for Acute diastolic heart failure HTN (hypertension) Hyperlipidemia Wound of left leg(since Nov 2014-f/u [place]) Bilateral moderate carotid disease by doppler Atrial fibrillation-conversion 10/01/13 Mild aortic stenosis with normal LVF Pneumonia(Levaquin 750mg  daily) Acute resp failure     Presented ED with SOB. She had a prior history of PVCs but no confirmed AF per her recollection. She was noted in AF with rates in the >150 range. She was given diltiazem en route. In the ED, she was given additional diltiazem and placed on an infusion with better control of her HR in the 110s-120s. She was admitted to hospital for AF-RVR and continue on IV dilt. IV heparin added but later changed to Eliquis. She was  given 20mg  of IV lasix for volume overload. She ultimately diuresed -14L net. TEE/DCCV was completed on Aug 14. Fortaz and vancomycin started Monday, Aug 10 for PNA. WBC decreased. She was changed to PO Levaquin 750 mg a day x 5 days(dc 10/07/13)   Problem List Items Addressed This Visit   Acute diastolic heart failure - Primary     10/29/13 Cardiology increased Furosemide 80mg  Kcl 84meq bid-slept better last night 11/01/13. Continue daily weight. F/u BMP. Worsened BLE edema.     Relevant Medications      furosemide (LASIX) 80 MG tablet   Acute respiratory failure with hypoxia     secondary to HCAP/CHF  Upon discharge: Improving. Now on RA. MBS negative for aspiration  Worse O2 Sat 76-78% exertional-requires O2 to bring O2 sat 88-93%-f/u Pulmonology. Continue prn Xopenex.  11/02/13 O2 Sat 90s% while O2  when ambulated.       Atrial fibrillation     New onset. Had cardioversion. HR 110s and irregular. Takes Metoprolol 37.5mg  pm and 12.5mg  am. Eliquis 5mg  bid for PE/DVT risk reduction. Increased Furosemide 80mg  bid may help. Saw Cardiology 10/29/13. Continue daily weight. The patient is asymptomatic especially after Furosemide was increased to 80mg  bid 10/29/13     Relevant Medications      furosemide (LASIX) 80 MG tablet   HCAP (healthcare-associated pneumonia)     Completed Azithromycin for 3 more days  Elevate extremities  Daily weights  Taper steroids slowly: 40 mg x 3 days, 30 mg x 3  days, 20 mg x 3 days, 10 mg x 3 days, 5 mg x 3 days  PRN O2       HTN (hypertension) (Chronic)     Controlled. Takes Furosemide 80mg  bid, Diltiazem 240mg , and Metoprolol 12.5mg  am and 37.5mg  pm      Relevant Medications      furosemide (LASIX) 80 MG tablet   Lung nodule     recommendation CT chest repeated in 6-8 weeks to re-evaluate the areas of nodularity       Wound of left leg (Chronic)     Left lower leg since Nov 2014-currently compression wrap-seen at [facility] weekly.           Review of Systems:  Review of Systems  Constitutional: Negative for fever, chills, weight loss, malaise/fatigue and diaphoresis.  HENT: Positive for hearing loss. Negative for congestion, ear discharge, ear pain, nosebleeds, sore throat and tinnitus.   Eyes: Negative for blurred vision, double vision, photophobia, pain, discharge and redness.  Respiratory: Positive for cough. Negative for hemoptysis, sputum production, shortness of breath, wheezing and stridor.        Hacking  Cardiovascular: Positive for leg swelling and PND. Negative for chest pain, palpitations, orthopnea and claudication.       BLE L>R. 2+  Gastrointestinal: Negative for heartburn, nausea, vomiting, abdominal pain, diarrhea, constipation, blood in stool and melena.  Genitourinary: Negative for dysuria, urgency, frequency, hematuria and flank pain.  Musculoskeletal: Negative for back pain, falls, joint pain, myalgias and neck pain.       No further hematuria since Foley Catheter removed.   Skin: Negative for itching and rash.       Left lower leg wound since Nov 2014-f/u [place] weekly.   Neurological: Negative for dizziness, tingling, tremors, sensory change, speech change, focal weakness, seizures, loss of consciousness, weakness and headaches.  Endo/Heme/Allergies: Negative for environmental allergies and polydipsia. Does not bruise/bleed easily.  Psychiatric/Behavioral: Negative for depression, suicidal ideas, hallucinations, memory loss and substance abuse. The patient is not nervous/anxious and does not have insomnia.        Slept better since Furosemide 80mg  bid 10/29/13     Past Medical History  Diagnosis Date  . Hyperlipidemia   . Arrhythmia     PVC  . Retinal detachment   . Renal disorder   . Allergy   . Occlusion and stenosis of carotid artery without mention of cerebral infarction 03/24/2012  . Pneumonia, organism unspecified 11/192013  . Depressive disorder, not elsewhere classified  03/05/2011  . Other premature beats 03/05/2011  . Other seborrheic keratosis 11/20/2010  . Syncope and collapse 10/02/2010  . Palpitations 11/21/2009  . Unspecified essential hypertension 06/27/2009  . Cardiomegaly 06/27/2009  . Other generalized ischemic cerebrovascular disease 06/27/2009  . Dizziness and giddiness 06/27/2009  . Unspecified vitamin D deficiency 05/16/2009  . Family history of sudden cardiac death (SCD) December 13, 2008  . Rash and other nonspecific skin eruption 07/05/2008  . Edema 03/15/2008  . Obesity, unspecified 07/25/2006  . Varicose veins of lower extremities with inflammation 03/16/2003  . Shortness of breath    Past Surgical History  Procedure Laterality Date  . Cholecystectomy  1983  . Cataract extraction Bilateral     Dr. [name]  . Tonsillectomy    . Keratosis    . Retinal detachment surgery Left   . Cardioversion N/A 10/01/2013    Procedure: CARDIOVERSION;  Surgeon: Sanda Klein, MD;  Location: [facility];  Service: Cardiovascular;  Laterality: N/A;  .  Tee without cardioversion N/A 10/01/2013    Procedure: TRANSESOPHAGEAL ECHOCARDIOGRAM (TEE);  Surgeon: Sanda Klein, MD;  Location: [facility] ENDOSCOPY;  Service: Cardiovascular;  Laterality: N/A;   Social History:   reports that she has never smoked. She has never used smokeless tobacco. She reports that she does not drink alcohol or use illicit drugs.  Family History  Problem Relation Age of Onset  . Arrhythmia      Long QT in first degree relatives,  . Heart attack Father   . Heart disease Father   . Heart attack Brother   . Heart disease Brother   . Pneumonia Mother     Medications: Patient's Medications  New Prescriptions   No medications on file  Previous Medications   APIXABAN (ELIQUIS) 5 MG TABS TABLET    Take 1 tablet (5 mg total) by mouth 2 (two) times daily.   DILTIAZEM (CARDIZEM CD) 240 MG 24 HR CAPSULE    Take 1 capsule (240 mg total) by mouth daily.   FUROSEMIDE (LASIX) 80 MG TABLET    Take 80 mg  by mouth 2 (two) times daily.   LEVALBUTEROL (XOPENEX) 0.63 MG/3ML NEBULIZER SOLUTION    Take 3 mLs (0.63 mg total) by nebulization every 4 (four) hours as needed for wheezing or shortness of breath.   METOPROLOL (LOPRESSOR) 12.5 MG TABS TABLET    Take 1.5 tablets (37.5 mg total) by mouth 2 (two) times daily.   OXYGEN    Inhale 2 L into the lungs daily.   POTASSIUM CHLORIDE SA (K-DUR,KLOR-CON) 20 MEQ TABLET    Take 1 tablet (20 mEq total) by mouth 2 (two) times daily.   PREDNISONE (DELTASONE) 20 MG TABLET    Take 2 tablets (40 mg total) by mouth daily with breakfast. With slow taper   VITAMIN E 1000 UNIT CAPSULE    Take 1,000 Units by mouth daily.  Modified Medications   No medications on file  Discontinued Medications   FUROSEMIDE (LASIX) 40 MG TABLET    Take 1 tablet (40 mg total) by mouth 2 (two) times daily.     Physical Exam: Physical Exam  Constitutional:  overwewight  HENT:  Right Ear: External ear normal.  Left Ear: External ear normal.  Nose: Nose normal.  Eyes:  Corrective lenses.  Neck: Normal range of motion. Neck supple. No JVD present. No tracheal deviation present. No thyromegaly present.  Cardiovascular: Intact distal pulses.  Exam reveals no gallop and no friction rub.   Murmur heard. Varicose leg veins. 2+ edema LLE>RLE. HR 110-120 irregular. Systolic murmur apex of heart 2-3/6 noted.   Pulmonary/Chest: No respiratory distress. She has no wheezes. She has rales (bronchial rattle).  Moist rales posterior lower lungs.   Abdominal: Soft. Bowel sounds are normal. She exhibits no distension and no mass. There is no tenderness.  Musculoskeletal: Normal range of motion. She exhibits edema. She exhibits no tenderness.  2+edema BLE L>R  Lymphadenopathy:    She has no cervical adenopathy.  Neurological: She is alert. No cranial nerve deficit. Coordination normal.  Skin: No rash noted. No erythema. No pallor.  Open area is about 85mm by 14 mm. Some swelling of the leg above  this. Small hematoma at superior end of the injury.  Psychiatric: She has a normal mood and affect. Her behavior is normal. Judgment and thought content normal.    There were no vitals filed for this visit.    Labs reviewed: Basic Metabolic Panel:  Recent Labs  09/24/13 [phone]  10/12/13 0404  10/18/13 1118 10/19/13 0340 10/20/13 0436  NA  --   < > 137  < > 129* 128* 134*  K  --   < > 3.5*  < > 5.3 5.0 4.8  CL  --   < > 95*  < > 92* 92* 94*  CO2  --   < > 27  < > 23 27 29   GLUCOSE  --   < > 92  < > 119* 119* 131*  BUN  --   < > 13  < > 28* 31* 33*  CREATININE  --   < > 0.60  < > 0.55 0.62 0.64  CALCIUM  --   < > 7.9*  < > 8.7 8.4 8.4  MG  --   --  1.7  --   --   --   --   TSH 2.580  --   --   --   --   --   --   < > = values in this interval not displayed. Liver Function Tests:  Recent Labs  03/22/13 09/24/13 0200  AST 17 44*  ALT 9 39*  ALKPHOS 88 83  BILITOT  --  0.5  PROT  --  8.0  ALBUMIN  --  3.7   No results found for this basename: LIPASE, AMYLASE,  in the last 8760 hours No results found for this basename: AMMONIA,  in the last 8760 hours CBC:  Recent Labs  09/28/13 0348  10/11/13 1243  10/15/13 0534 10/18/13 0425 10/29/13 1625  WBC 9.6  < > 7.8  < > 9.6 17.1* 16.1*  NEUTROABS 7.0  --  5.5  --   --   --  14.6*  HGB 11.8*  < > 11.8*  < > 12.4 11.8* 11.6*  HCT 34.8*  < > 36.2  < > 38.3 35.1* 34.9*  MCV 92.6  < > 92.8  < > 96.7 95.1 93.9  PLT 299  < > 337  < > 376 433* 301.0  < > = values in this interval not displayed. Lipid Panel:  Recent Labs  03/22/13 09/25/13 0321  CHOL 229* 191  HDL 63 86  LDLCALC 148 95  TRIG 92 48  CHOLHDL  --  2.2    Past Procedures:  09/30/13 CXR  IMPRESSION: No active cardiopulmonary disease.  10/01/13   Echocardiogram transesophageal                             LV EF: 55% -   60%        Assessment/Plan Acute diastolic heart failure 1/61/09 Cardiology increased Furosemide 80mg  Kcl 60meq bid-slept  better last night 11/01/13. Continue daily weight. F/u BMP. Worsened BLE edema.   Acute respiratory failure with hypoxia secondary to HCAP/CHF  Upon discharge: Improving. Now on RA. MBS negative for aspiration  Worse O2 Sat 76-78% exertional-requires O2 to bring O2 sat 88-93%-f/u Pulmonology. Continue prn Xopenex.  11/02/13 O2 Sat 90s% while O2 [place] when ambulated.     Atrial fibrillation New onset. Had cardioversion. HR 110s and irregular. Takes Metoprolol 37.5mg  pm and 12.5mg  am. Eliquis 5mg  bid for PE/DVT risk reduction. Increased Furosemide 80mg  bid may help. Saw Cardiology 10/29/13. Continue daily weight. The patient is asymptomatic especially after Furosemide was increased to 80mg  bid 10/29/13   HCAP (healthcare-associated pneumonia) Completed Azithromycin for 3 more days  Elevate extremities  Daily weights  Taper steroids slowly: 40  mg x 3 days, 30 mg x 3 days, 20 mg x 3 days, 10 mg x 3 days, 5 mg x 3 days  PRN O2     HTN (hypertension) Controlled. Takes Furosemide 80mg  bid, Diltiazem 240mg , and Metoprolol 12.5mg  am and 37.5mg  pm    Lung nodule recommendation CT chest repeated in 6-8 weeks to re-evaluate the areas of nodularity     Wound of left leg Left lower leg since Nov 2014-currently compression wrap-seen at [facility] weekly.      Family/ Staff Communication: observe the patient.   Goals of Care: IL  Labs/tests ordered: BMP

## 2013-11-02 NOTE — Assessment & Plan Note (Addendum)
10/29/13 Cardiology increased Furosemide 80mg  Kcl 61meq bid-slept better last night 11/01/13. Continue daily weight. F/u BMP. Worsened BLE edema.

## 2013-11-02 NOTE — Assessment & Plan Note (Signed)
recommendation CT chest repeated in 6-8 weeks to re-evaluate the areas of nodularity

## 2013-11-02 NOTE — Assessment & Plan Note (Signed)
Completed Azithromycin for 3 more days  Elevate extremities  Daily weights  Taper steroids slowly: 40 mg x 3 days, 30 mg x 3 days, 20 mg x 3 days, 10 mg x 3 days, 5 mg x 3 days  PRN O2

## 2013-11-02 NOTE — Assessment & Plan Note (Signed)
Left lower leg since Nov 2014-currently compression wrap-seen at [facility] weekly.

## 2013-11-02 NOTE — Assessment & Plan Note (Signed)
secondary to HCAP/CHF  Upon discharge: Improving. Now on RA. MBS negative for aspiration  Worse O2 Sat 76-78% exertional-requires O2 to bring O2 sat 88-93%-f/u Pulmonology. Continue prn Xopenex.  11/02/13 O2 Sat 90s% while O2 [place] when ambulated.

## 2013-11-02 NOTE — Assessment & Plan Note (Addendum)
New onset. Had cardioversion. HR 110s and irregular. Takes Metoprolol 37.5mg  pm and 12.5mg  am. Eliquis 5mg  bid for PE/DVT risk reduction. Increased Furosemide 80mg  bid may help. Saw Cardiology 10/29/13. Continue daily weight. The patient is asymptomatic especially after Furosemide was increased to 80mg  bid 10/29/13

## 2013-11-02 NOTE — Assessment & Plan Note (Signed)
Controlled. Takes Furosemide 80mg  bid, Diltiazem 240mg , and Metoprolol 12.5mg  am and 37.5mg  pm

## 2013-11-03 ENCOUNTER — Telehealth: Payer: Self-pay | Admitting: Physician Assistant

## 2013-11-03 LAB — BASIC METABOLIC PANEL
BUN: 17 mg/dL (ref 4–21)
Creatinine: 0.6 mg/dL (ref 0.5–1.1)
Glucose: 93 mg/dL
Potassium: 3.2 mmol/L — AB (ref 3.4–5.3)
Sodium: 139 mmol/L (ref 137–147)

## 2013-11-03 NOTE — Telephone Encounter (Signed)
Took call from [place]. She has lab results needing to be faxed over. Provided fax number. Dr. Percival Spanish will be in office tomorrow 9/17 and can review.

## 2013-11-03 NOTE — Telephone Encounter (Signed)
Labs received and placed in Dr. Rosezella [place] box for him to review.

## 2013-11-04 NOTE — Progress Notes (Signed)
Quick Note:  ATC pt - line rang numerous times with no answer and no option to LM. ______

## 2013-11-05 ENCOUNTER — Encounter: Payer: Self-pay | Admitting: Nurse Practitioner

## 2013-11-05 ENCOUNTER — Non-Acute Institutional Stay (SKILLED_NURSING_FACILITY): Payer: Medicare Other | Admitting: Nurse Practitioner

## 2013-11-05 DIAGNOSIS — I10 Essential (primary) hypertension: Secondary | ICD-10-CM

## 2013-11-05 DIAGNOSIS — J9601 Acute respiratory failure with hypoxia: Secondary | ICD-10-CM

## 2013-11-05 DIAGNOSIS — J9600 Acute respiratory failure, unspecified whether with hypoxia or hypercapnia: Secondary | ICD-10-CM

## 2013-11-05 DIAGNOSIS — I5031 Acute diastolic (congestive) heart failure: Secondary | ICD-10-CM

## 2013-11-05 DIAGNOSIS — I482 Chronic atrial fibrillation, unspecified: Secondary | ICD-10-CM

## 2013-11-05 DIAGNOSIS — F3289 Other specified depressive episodes: Secondary | ICD-10-CM

## 2013-11-05 DIAGNOSIS — F32A Depression, unspecified: Secondary | ICD-10-CM

## 2013-11-05 DIAGNOSIS — J189 Pneumonia, unspecified organism: Secondary | ICD-10-CM

## 2013-11-05 DIAGNOSIS — F329 Major depressive disorder, single episode, unspecified: Secondary | ICD-10-CM

## 2013-11-05 DIAGNOSIS — I4891 Unspecified atrial fibrillation: Secondary | ICD-10-CM

## 2013-11-05 DIAGNOSIS — E876 Hypokalemia: Secondary | ICD-10-CM

## 2013-11-05 NOTE — Assessment & Plan Note (Signed)
econdary to HCAP/CHF  Upon discharge: Improving. Now on RA. MBS negative for aspiration  Worse O2 Sat 76-78% exertional-requires O2 to bring O2 sat 88-93%-f/u Pulmonology. Continue prn Xopenex.  11/02/13 O2 Sat 90s% while O2 [place] when ambulated.

## 2013-11-05 NOTE — Assessment & Plan Note (Signed)
New onset. Had cardioversion. HR 110s and irregular. Takes Metoprolol 37.5mg  pm and 12.5mg  am. Eliquis 5mg  bid for PE/DVT risk reduction. Increased Furosemide 80mg  bid may help. Saw Cardiology 10/29/13. Continue daily weight. The patient is asymptomatic especially after Furosemide was increased to 80mg  bid 10/29/13

## 2013-11-05 NOTE — Assessment & Plan Note (Signed)
Mild, serum k 3.2 10/03/13 @ [facility]-Kcl 11meq po x1-repeat BMP in 2 weeks.

## 2013-11-05 NOTE — Assessment & Plan Note (Signed)
Controlled. Takes Furosemide 80mg  bid, Diltiazem 240mg , and Metoprolol 12.5mg  am and 37.5mg  pm

## 2013-11-05 NOTE — Assessment & Plan Note (Signed)
Staff reported the resident appears depressed, looks sad, has made statement like: I don't think I will get back to my apartment. Often she looks like about to cry. Sits a lot with her face in her hands. Will try Sertraline 25mg  po daily. The patient denied change in her appetite and trouble sleep at night.

## 2013-11-05 NOTE — Progress Notes (Signed)
Patient ID: Julie Haas, female   DOB: 10/18/1925, 80 y.o.   MRN: [id number]    Code Status: DNR, living will.   Allergies  Allergen Reactions  . Codeine     Unknown  . Cucumber Extract Nausea And Vomiting  . Diuretic [Buchu-Cornsilk-Ch Grass-Hydran]     Unknown  . Lipitor [Atorvastatin Calcium]     Unknown  . Penicillins Other (See Comments)    unknown  . Pravachol     Unknown    Chief Complaint  Patient presents with  . Medical Management of Chronic Issues  . Acute Visit    ? depression.     HPI: Patient is a 80 y.o. female seen in the SNF at [facility] today for evaluation of depression and other chronic medical conditions.     Hospitalized 10/11/2013-10/20/2013 for HCAP (healthcare-associated pneumonia) Atrial fibrillation Acute diastolic heart failure Hypoxemia Acute respiratory failure with hypoxia.  The patient had recent admission to hospital for PNA s/p TEE/DCCV (discharged on 8/15) and presented to [facility] ED with main concern of progressively worsening shortness of breath, mostly exertional but occasionally present at rest. Associated with productive cough of yellow sputum, subjective fevers, chills, LE edema and 2 pillow orthopnea      Hospitalized 09/24/2013-10/02/2013 for Acute diastolic heart failure HTN (hypertension) Hyperlipidemia Wound of left leg(since Nov 2014-f/u [place]) Bilateral moderate carotid disease by doppler Atrial fibrillation-conversion 10/01/13 Mild aortic stenosis with normal LVF Pneumonia(Levaquin 750mg  daily) Acute resp failure     Presented ED with SOB. She had a prior history of PVCs but no confirmed AF per her recollection. She was noted in AF with rates in the >150 range. She was given diltiazem en route. In the ED, she was given additional diltiazem and placed on an infusion with better control of her HR in the 110s-120s. She was admitted to hospital for AF-RVR and continue on IV dilt. IV heparin added but later changed to Eliquis.  She was given 20mg  of IV lasix for volume overload. She ultimately diuresed -14L net. TEE/DCCV was completed on Aug 14. Fortaz and vancomycin started Monday, Aug 10 for PNA. WBC decreased. She was changed to PO Levaquin 750 mg a day x 5 days(dc 10/07/13)   Problem List Items Addressed This Visit   Hypokalemia     Mild, serum k 3.2 10/03/13 @ [facility]-Kcl 50meq po x1-repeat BMP in 2 weeks.     HTN (hypertension) (Chronic)     Controlled. Takes Furosemide 80mg  bid, Diltiazem 240mg , and Metoprolol 12.5mg  am and 37.5mg  pm      HCAP (healthcare-associated pneumonia)     Completed Azithromycin for 3 more days  Elevate extremities  Daily weights  Taper steroids slowly: 40 mg x 3 days, 30 mg x 3 days, 20 mg x 3 days, 10 mg x 3 days, 5 mg x 3 days  PRN O2       Depression - Primary     Staff reported the resident appears depressed, looks sad, has made statement like: I don't think I will get back to my apartment. Often she looks like about to cry. Sits a lot with her face in her hands. Will try Sertraline 25mg  po daily. The patient denied change in her appetite and trouble sleep at night.     Atrial fibrillation     New onset. Had cardioversion. HR 110s and irregular. Takes Metoprolol 37.5mg  pm and 12.5mg  am. Eliquis 5mg  bid for PE/DVT risk reduction. Increased Furosemide 80mg  bid may  help. Saw Cardiology 10/29/13. Continue daily weight. The patient is asymptomatic especially after Furosemide was increased to 80mg  bid 10/29/13      Acute respiratory failure with hypoxia     econdary to HCAP/CHF  Upon discharge: Improving. Now on RA. MBS negative for aspiration  Worse O2 Sat 76-78% exertional-requires O2 to bring O2 sat 88-93%-f/u Pulmonology. Continue prn Xopenex.  11/02/13 O2 Sat 90s% while O2 [place] when ambulated.        Acute diastolic heart failure     10/29/13 Cardiology increased Furosemide 80mg  Kcl 55meq bid-slept better last night 11/01/13. Continue daily weight. F/u BMP. Chronic BLE edema.          Review of Systems:  Review of Systems  Constitutional: Negative for fever, chills, weight loss, malaise/fatigue and diaphoresis.  HENT: Positive for hearing loss. Negative for congestion, ear discharge, ear pain, nosebleeds, sore throat and tinnitus.   Eyes: Negative for blurred vision, double vision, photophobia, pain, discharge and redness.  Respiratory: Positive for cough. Negative for hemoptysis, sputum production, shortness of breath, wheezing and stridor.        Hacking  Cardiovascular: Positive for leg swelling and PND. Negative for chest pain, palpitations, orthopnea and claudication.       BLE L>R. 2+  Gastrointestinal: Negative for heartburn, nausea, vomiting, abdominal pain, diarrhea, constipation, blood in stool and melena.  Genitourinary: Negative for dysuria, urgency, frequency, hematuria and flank pain.  Musculoskeletal: Negative for back pain, falls, joint pain, myalgias and neck pain.       No further hematuria since Foley Catheter removed.   Skin: Negative for itching and rash.       Left lower leg wound since Nov 2014-f/u [place] weekly.   Neurological: Negative for dizziness, tingling, tremors, sensory change, speech change, focal weakness, seizures, loss of consciousness, weakness and headaches.  Endo/Heme/Allergies: Negative for environmental allergies and polydipsia. Does not bruise/bleed easily.  Psychiatric/Behavioral: Positive for depression. Negative for suicidal ideas, hallucinations, memory loss and substance abuse. The patient is nervous/anxious. The patient does not have insomnia.        Slept better since Furosemide 80mg  bid 10/29/13     Past Medical History  Diagnosis Date  . Hyperlipidemia   . Arrhythmia     PVC  . Retinal detachment   . Renal disorder   . Allergy   . Occlusion and stenosis of carotid artery without mention of cerebral infarction 03/24/2012  . Pneumonia, organism unspecified 11/192013  . Depressive disorder, not  elsewhere classified 03/05/2011  . Other premature beats 03/05/2011  . Other seborrheic keratosis 11/20/2010  . Syncope and collapse 10/02/2010  . Palpitations 11/21/2009  . Unspecified essential hypertension 06/27/2009  . Cardiomegaly 06/27/2009  . Other generalized ischemic cerebrovascular disease 06/27/2009  . Dizziness and giddiness 06/27/2009  . Unspecified vitamin D deficiency 05/16/2009  . Family history of sudden cardiac death (SCD) 12-27-2008  . Rash and other nonspecific skin eruption 07/05/2008  . Edema 03/15/2008  . Obesity, unspecified 07/25/2006  . Varicose veins of lower extremities with inflammation 03/16/2003  . Shortness of breath    Past Surgical History  Procedure Laterality Date  . Cholecystectomy  1983  . Cataract extraction Bilateral     Dr. [name]  . Tonsillectomy    . Keratosis    . Retinal detachment surgery Left   . Cardioversion N/A 10/01/2013    Procedure: CARDIOVERSION;  Surgeon: Sanda Klein, MD;  Location: [facility] ENDOSCOPY;  Service: Cardiovascular;  Laterality: N/A;  . Tee without  cardioversion N/A 10/01/2013    Procedure: TRANSESOPHAGEAL ECHOCARDIOGRAM (TEE);  Surgeon: Sanda Klein, MD;  Location: [facility] ENDOSCOPY;  Service: Cardiovascular;  Laterality: N/A;   Social History:   reports that she has never smoked. She has never used smokeless tobacco. She reports that she does not drink alcohol or use illicit drugs.  Family History  Problem Relation Age of Onset  . Arrhythmia      Long QT in first degree relatives,  . Heart attack Father   . Heart disease Father   . Heart attack Brother   . Heart disease Brother   . Pneumonia Mother     Medications: Patient's Medications  New Prescriptions   No medications on file  Previous Medications   APIXABAN (ELIQUIS) 5 MG TABS TABLET    Take 1 tablet (5 mg total) by mouth 2 (two) times daily.   DILTIAZEM (CARDIZEM CD) 240 MG 24 HR CAPSULE    Take 1 capsule (240 mg total) by mouth daily.   FUROSEMIDE (LASIX) 80 MG  TABLET    Take 80 mg by mouth 2 (two) times daily.   LEVALBUTEROL (XOPENEX) 0.63 MG/3ML NEBULIZER SOLUTION    Take 3 mLs (0.63 mg total) by nebulization every 4 (four) hours as needed for wheezing or shortness of breath.   METOPROLOL (LOPRESSOR) 12.5 MG TABS TABLET    Take 1.5 tablets (37.5 mg total) by mouth 2 (two) times daily.   OXYGEN    Inhale 2 L into the lungs daily.   POTASSIUM CHLORIDE SA (K-DUR,KLOR-CON) 20 MEQ TABLET    Take 1 tablet (20 mEq total) by mouth 2 (two) times daily.   PREDNISONE (DELTASONE) 20 MG TABLET    Take 2 tablets (40 mg total) by mouth daily with breakfast. With slow taper   VITAMIN E 1000 UNIT CAPSULE    Take 1,000 Units by mouth daily.  Modified Medications   No medications on file  Discontinued Medications   No medications on file     Physical Exam: Physical Exam  Constitutional:  overwewight  HENT:  Right Ear: External ear normal.  Left Ear: External ear normal.  Nose: Nose normal.  Eyes:  Corrective lenses.  Neck: Normal range of motion. Neck supple. No JVD present. No tracheal deviation present. No thyromegaly present.  Cardiovascular: Intact distal pulses.  Exam reveals no gallop and no friction rub.   Murmur heard. Varicose leg veins. 2+ edema LLE>RLE. HR 90s irregular. Systolic murmur apex of heart 2-3/6 noted.   Pulmonary/Chest: No respiratory distress. She has no wheezes. She has rales (bronchial rattle).  Moist rales posterior lower lungs.   Abdominal: Soft. Bowel sounds are normal. She exhibits no distension and no mass. There is no tenderness.  Musculoskeletal: Normal range of motion. She exhibits edema. She exhibits no tenderness.  2+edema BLE L>R  Lymphadenopathy:    She has no cervical adenopathy.  Neurological: She is alert. No cranial nerve deficit. Coordination normal.  Skin: No rash noted. No erythema. No pallor.  Open area is about 52mm by 14 mm. Some swelling of the leg above this. Small hematoma at superior end of the injury.    Psychiatric: Her behavior is normal. Judgment and thought content normal.  Depressive and anxious mood.     Filed Vitals:   11/05/13 1329  BP: 138/82  Pulse: 100  Temp: 97 F (36.1 C)  TempSrc: Tympanic  Resp: 20      Labs reviewed: Basic Metabolic Panel:  Recent Labs  09/24/13 [phone]  10/12/13 0404  10/18/13 1118 10/19/13 0340 10/20/13 0436 11/03/13  NA  --   < > 137  < > 129* 128* 134* 139  K  --   < > 3.5*  < > 5.3 5.0 4.8 3.2*  CL  --   < > 95*  < > 92* 92* 94*  --   CO2  --   < > 27  < > 23 27 29   --   GLUCOSE  --   < > 92  < > 119* 119* 131*  --   BUN  --   < > 13  < > 28* 31* 33* 17  CREATININE  --   < > 0.60  < > 0.55 0.62 0.64 0.6  CALCIUM  --   < > 7.9*  < > 8.7 8.4 8.4  --   MG  --   --  1.7  --   --   --   --   --   TSH 2.580  --   --   --   --   --   --   --   < > = values in this interval not displayed. Liver Function Tests:  Recent Labs  03/22/13 09/24/13 0200  AST 17 44*  ALT 9 39*  ALKPHOS 88 83  BILITOT  --  0.5  PROT  --  8.0  ALBUMIN  --  3.7   No results found for this basename: LIPASE, AMYLASE,  in the last 8760 hours No results found for this basename: AMMONIA,  in the last 8760 hours CBC:  Recent Labs  09/28/13 0348  10/11/13 1243  10/15/13 0534 10/18/13 0425 10/29/13 1625  WBC 9.6  < > 7.8  < > 9.6 17.1* 16.1*  NEUTROABS 7.0  --  5.5  --   --   --  14.6*  HGB 11.8*  < > 11.8*  < > 12.4 11.8* 11.6*  HCT 34.8*  < > 36.2  < > 38.3 35.1* 34.9*  MCV 92.6  < > 92.8  < > 96.7 95.1 93.9  PLT 299  < > 337  < > 376 433* 301.0  < > = values in this interval not displayed. Lipid Panel:  Recent Labs  03/22/13 09/25/13 0321  CHOL 229* 191  HDL 63 86  LDLCALC 148 95  TRIG 92 48  CHOLHDL  --  2.2    Past Procedures:  09/30/13 CXR  IMPRESSION: No active cardiopulmonary disease.  10/01/13   Echocardiogram transesophageal                             LV EF: 55% -   60%        Assessment/Plan Hypokalemia Mild, serum k  3.2 10/03/13 @ [facility] 25meq po x1-repeat BMP in 2 weeks.   Depression Staff reported the resident appears depressed, looks sad, has made statement like: I don't think I will get back to my apartment. Often she looks like about to cry. Sits a lot with her face in her hands. Will try Sertraline 25mg  po daily. The patient denied change in her appetite and trouble sleep at night.   Acute diastolic heart failure 03/29/45 Cardiology increased Furosemide 80mg  Kcl 92meq bid-slept better last night 11/01/13. Continue daily weight. F/u BMP. Chronic BLE edema.    Acute respiratory failure with hypoxia econdary to HCAP/CHF  Upon discharge: Improving. Now on RA. MBS negative for aspiration  Worse O2 Sat 76-78% exertional-requires O2 to bring O2 sat 88-93%-f/u Pulmonology. Continue prn Xopenex.  11/02/13 O2 Sat 90s% while O2 [place] when ambulated.      Atrial fibrillation New onset. Had cardioversion. HR 110s and irregular. Takes Metoprolol 37.5mg  pm and 12.5mg  am. Eliquis 5mg  bid for PE/DVT risk reduction. Increased Furosemide 80mg  bid may help. Saw Cardiology 10/29/13. Continue daily weight. The patient is asymptomatic especially after Furosemide was increased to 80mg  bid 10/29/13    HCAP (healthcare-associated pneumonia) Completed Azithromycin for 3 more days  Elevate extremities  Daily weights  Taper steroids slowly: 40 mg x 3 days, 30 mg x 3 days, 20 mg x 3 days, 10 mg x 3 days, 5 mg x 3 days  PRN O2     HTN (hypertension) Controlled. Takes Furosemide 80mg  bid, Diltiazem 240mg , and Metoprolol 12.5mg  am and 37.5mg  pm      Family/ Staff Communication: observe the patient.   Goals of Care: IL  Labs/tests ordered: BMP 2 weeks.

## 2013-11-05 NOTE — Assessment & Plan Note (Signed)
10/29/13 Cardiology increased Furosemide 80mg  Kcl 67meq bid-slept better last night 11/01/13. Continue daily weight. F/u BMP. Chronic BLE edema.

## 2013-11-05 NOTE — Assessment & Plan Note (Signed)
Completed Azithromycin for 3 more days  Elevate extremities  Daily weights  Taper steroids slowly: 40 mg x 3 days, 30 mg x 3 days, 20 mg x 3 days, 10 mg x 3 days, 5 mg x 3 days  PRN O2

## 2013-11-08 ENCOUNTER — Ambulatory Visit (INDEPENDENT_AMBULATORY_CARE_PROVIDER_SITE_OTHER): Payer: Medicare Other | Admitting: Physician Assistant

## 2013-11-08 ENCOUNTER — Encounter: Payer: Self-pay | Admitting: Physician Assistant

## 2013-11-08 ENCOUNTER — Ambulatory Visit: Payer: Medicare Other | Admitting: Emergency Medicine

## 2013-11-08 VITALS — BP 116/68 | HR 82 | Ht 64.0 in | Wt 203.3 lb

## 2013-11-08 DIAGNOSIS — I6529 Occlusion and stenosis of unspecified carotid artery: Secondary | ICD-10-CM

## 2013-11-08 DIAGNOSIS — I4819 Other persistent atrial fibrillation: Secondary | ICD-10-CM

## 2013-11-08 DIAGNOSIS — E876 Hypokalemia: Secondary | ICD-10-CM

## 2013-11-08 DIAGNOSIS — I359 Nonrheumatic aortic valve disorder, unspecified: Secondary | ICD-10-CM

## 2013-11-08 DIAGNOSIS — I10 Essential (primary) hypertension: Secondary | ICD-10-CM

## 2013-11-08 DIAGNOSIS — I5031 Acute diastolic (congestive) heart failure: Secondary | ICD-10-CM

## 2013-11-08 DIAGNOSIS — I5032 Chronic diastolic (congestive) heart failure: Secondary | ICD-10-CM

## 2013-11-08 DIAGNOSIS — I4891 Unspecified atrial fibrillation: Secondary | ICD-10-CM

## 2013-11-08 DIAGNOSIS — I350 Nonrheumatic aortic (valve) stenosis: Secondary | ICD-10-CM

## 2013-11-08 DIAGNOSIS — E785 Hyperlipidemia, unspecified: Secondary | ICD-10-CM

## 2013-11-08 NOTE — Progress Notes (Signed)
Date:  11/08/2013   ID:  [name], DOB 03-28-1925, MRN [id number]  PCP:  Estill Dooms, MD  Primary Cardiologist:  H[place]rein    History of Present Illness: Julie Haas is a 80 y.o. female with PMH of HTN, HPL, CHF, A Fib on eliquis, admitted 08/07-08/15 by cardiology service for a fib RVR, pneumonia. S/p TEE/DCCV. Echocardiogram on August 7 revealed an ejection fraction of 55-60% with mild LVH. She presented 08/24 with worsening SOB, DOE, mild productive cough, chills. She was treated for pneumonia and cardiology was consult for CHF and atrial fibrillation. Her rate was well controlled on metoprolol and anticoagulated with Eliquis.   On September 11 patient was seen in clinic at which time I increased her Lasix to 80 mg twice daily. She now returns for followup. She's had improvement in the lower edema, particularly on the left, her weight is down 5 pounds.  She reports feeling a little better and breathing easier, however, she requires oxygen.   She does sleep on an incline.  She denies nausea, vomiting, fever, chest pain,  dizziness, PND, cough, congestion, abdominal pain, hematochezia, melena,  Wt Readings from Last 3 Encounters:  11/08/13 203 lb 4.8 oz (92.216 kg)  10/29/13 208 lb 6.4 oz (94.53 kg)  10/29/13 207 lb 12.8 oz (94.257 kg)     Past Medical History  Diagnosis Date  . Hyperlipidemia   . Arrhythmia     PVC  . Retinal detachment   . Renal disorder   . Allergy   . Occlusion and stenosis of carotid artery without mention of cerebral infarction 03/24/2012  . Pneumonia, organism unspecified 11/192013  . Depressive disorder, not elsewhere classified 03/05/2011  . Other premature beats 03/05/2011  . Other seborrheic keratosis 11/20/2010  . Syncope and collapse 10/02/2010  . Palpitations 11/21/2009  . Unspecified essential hypertension 06/27/2009  . Cardiomegaly 06/27/2009  . Other generalized ischemic cerebrovascular disease 06/27/2009  . Dizziness and giddiness  06/27/2009  . Unspecified vitamin D deficiency 05/16/2009  . Family history of sudden cardiac death (SCD) December 20, 2008  . Rash and other nonspecific skin eruption 07/05/2008  . Edema 03/15/2008  . Obesity, unspecified 07/25/2006  . Varicose veins of lower extremities with inflammation 03/16/2003  . Shortness of breath     Current Outpatient Prescriptions  Medication Sig Dispense Refill  . apixaban (ELIQUIS) 5 MG TABS tablet Take 1 tablet (5 mg total) by mouth 2 (two) times daily.  60 tablet  11  . diltiazem (CARDIZEM CD) 240 MG 24 hr capsule Take 1 capsule (240 mg total) by mouth daily.      . furosemide (LASIX) 80 MG tablet Take 80 mg by mouth 2 (two) times daily.      Marland Kitchen levalbuterol (XOPENEX) 0.63 MG/3ML nebulizer solution Take 3 mLs (0.63 mg total) by nebulization every 4 (four) hours as needed for wheezing or shortness of breath.  3 mL  12  . metoprolol (LOPRESSOR) 12.5 mg TABS tablet Take 1.5 tablets (37.5 mg total) by mouth 2 (two) times daily.      . OXYGEN Inhale 2 L into the lungs daily.      . potassium chloride SA (K-DUR,KLOR-CON) 20 MEQ tablet Take 1 tablet (20 mEq total) by mouth 2 (two) times daily.  60 tablet  5  . sertraline (ZOLOFT) 25 MG tablet Take 25 mg by mouth daily.      . vitamin E 1000 UNIT capsule Take 1,000 Units by mouth daily.  No current facility-administered medications for this visit.    Allergies:    Allergies  Allergen Reactions  . Codeine     Unknown  . Cucumber Extract Nausea And Vomiting  . Diuretic [Buchu-Cornsilk-Ch Grass-Hydran]     Unknown  . Lipitor [Atorvastatin Calcium]     Unknown  . Penicillins Other (See Comments)    unknown  . Pravachol     Unknown    Social History:  The patient  reports that she has never smoked. She has never used smokeless tobacco. She reports that she does not drink alcohol or use illicit drugs.   Family history:   Family History  Problem Relation Age of Onset  . Arrhythmia      Long QT in first degree  relatives,  . Heart attack Father   . Heart disease Father   . Heart attack Brother   . Heart disease Brother   . Pneumonia Mother     ROS:  Please see the history of present illness.  All other systems reviewed and negative.   PHYSICAL EXAM: VS:  BP 116/68  Pulse 82  Ht 5\' 4"  (1.626 m)  Wt 203 lb 4.8 oz (92.216 kg)  BMI 34.88 kg/m2 Well nourished, well developed, in no acute distress HEENT: Pupils are equal round react to light accommodation extraocular movements are intact.  Neck: no JVDNo cervical lymphadenopathy. Cardiac: Regular rate and rhythm with 1/6 sys murmur. Lungs:  Rales /crackles > on right Abd: +BS Ext: 2+ right lower extremity edema. Trace on the left.  2+ radial and dorsalis pedis pulses. Skin: warm and dry Neuro:  Grossly normal  EKG:  None  ASSESSMENT AND PLAN:  Problem List Items Addressed This Visit   HTN (hypertension) (Chronic)     Blood pressure well controlled. No change in therapy    Hyperlipidemia (Chronic)      Lipid Panel     Component Value Date/Time   CHOL 191 09/25/2013 0321   TRIG 48 09/25/2013 0321   HDL 86 09/25/2013 0321   CHOLHDL 2.2 09/25/2013 0321   VLDL 10 09/25/2013 0321   LDLCALC 95 09/25/2013 0321   This is a very favorable lipid panel with low triglycerides, HDL. No need for statin.  She also has an allergy listed for two statins.     Mild aortic stenosis with normal LVF (Chronic)   Atrial fibrillation     Heart rate is well-controlled. Heart rate appears regular on exam. However, she was in A. fib during her last visit by EKG or. She is on Eliquis .    Acute diastolic heart failure     She does feel a little better .  Lower extremity edema has improved in the left lower extremity however, the right lower extremity still has 2+ lower extremity edema.   She is on Eliquis so DVT is less likely.  We'll continue Lasix at milligrams twice daily and continue to monitor weight daily. She was not wearing her compression sock today,  however, the staff at the  nursing facility has been putting them on daily.      Hypokalemia     Repeat basic metabolic panel today     Other Visit Diagnoses   Chronic diastolic HF (heart failure)    -  Primary    Relevant Orders       Basic Metabolic Panel (BMET)

## 2013-11-08 NOTE — Assessment & Plan Note (Signed)
She does feel a little better .  Lower extremity edema has improved in the left lower extremity however, the right lower extremity still has 2+ lower extremity edema.   She is on Eliquis so DVT is less likely.  We'll continue Lasix at milligrams twice daily and continue to monitor weight daily. She was not wearing her compression sock today, however, the staff at the  nursing facility has been putting them on daily.

## 2013-11-08 NOTE — Assessment & Plan Note (Signed)
Blood pressure well controlled. No change in therapy

## 2013-11-08 NOTE — Assessment & Plan Note (Signed)
Lipid Panel     Component Value Date/Time   CHOL 191 09/25/2013 0321   TRIG 48 09/25/2013 0321   HDL 86 09/25/2013 0321   CHOLHDL 2.2 09/25/2013 0321   VLDL 10 09/25/2013 0321   LDLCALC 95 09/25/2013 0321   This is a very favorable lipid panel with low triglycerides, HDL. No need for statin.  She also has an allergy listed for two statins.

## 2013-11-08 NOTE — Assessment & Plan Note (Addendum)
Heart rate is well-controlled. Heart rate appears regular on exam. However, she was in A. fib during her last visit by EKG or. She is on Eliquis .

## 2013-11-08 NOTE — Patient Instructions (Addendum)
1.  Continue lasix 80 mg twice daily. 2.  We will check a lab today:  Basic metabolic panel. 3.  Follow up with Dr. Percival Spanish in 3 months.  4.  Continue to monitor weight daily.  If your weight goes up three pounds in 24 hours, or 5 pounds in a week, call the ofice for instructions.

## 2013-11-08 NOTE — Assessment & Plan Note (Signed)
Repeat basic metabolic panel today

## 2013-11-09 ENCOUNTER — Encounter (HOSPITAL_COMMUNITY): Payer: Self-pay | Admitting: Emergency Medicine

## 2013-11-09 ENCOUNTER — Inpatient Hospital Stay (HOSPITAL_COMMUNITY)
Admission: EM | Admit: 2013-11-09 | Discharge: 2013-11-22 | DRG: 189 | Disposition: A | Discharge status: Skilled Nursing Facility | Payer: Medicare Other | Attending: Family Medicine | Admitting: Family Medicine

## 2013-11-09 ENCOUNTER — Emergency Department (HOSPITAL_COMMUNITY): Payer: Medicare Other

## 2013-11-09 ENCOUNTER — Inpatient Hospital Stay: Payer: Medicare Other | Admitting: Adult Health

## 2013-11-09 ENCOUNTER — Encounter: Payer: Self-pay | Admitting: Nurse Practitioner

## 2013-11-09 DIAGNOSIS — I5031 Acute diastolic (congestive) heart failure: Secondary | ICD-10-CM

## 2013-11-09 DIAGNOSIS — Z8249 Family history of ischemic heart disease and other diseases of the circulatory system: Secondary | ICD-10-CM | POA: Diagnosis not present

## 2013-11-09 DIAGNOSIS — D473 Essential (hemorrhagic) thrombocythemia: Secondary | ICD-10-CM | POA: Diagnosis present

## 2013-11-09 DIAGNOSIS — I513 Intracardiac thrombosis, not elsewhere classified: Secondary | ICD-10-CM | POA: Diagnosis present

## 2013-11-09 DIAGNOSIS — J9600 Acute respiratory failure, unspecified whether with hypoxia or hypercapnia: Secondary | ICD-10-CM | POA: Diagnosis not present

## 2013-11-09 DIAGNOSIS — Z66 Do not resuscitate: Secondary | ICD-10-CM | POA: Diagnosis present

## 2013-11-09 DIAGNOSIS — L03115 Cellulitis of right lower limb: Secondary | ICD-10-CM | POA: Diagnosis present

## 2013-11-09 DIAGNOSIS — D638 Anemia in other chronic diseases classified elsewhere: Secondary | ICD-10-CM | POA: Diagnosis present

## 2013-11-09 DIAGNOSIS — I959 Hypotension, unspecified: Secondary | ICD-10-CM | POA: Diagnosis not present

## 2013-11-09 DIAGNOSIS — I480 Paroxysmal atrial fibrillation: Secondary | ICD-10-CM

## 2013-11-09 DIAGNOSIS — J9601 Acute respiratory failure with hypoxia: Secondary | ICD-10-CM | POA: Diagnosis present

## 2013-11-09 DIAGNOSIS — R0609 Other forms of dyspnea: Secondary | ICD-10-CM

## 2013-11-09 DIAGNOSIS — F329 Major depressive disorder, single episode, unspecified: Secondary | ICD-10-CM | POA: Diagnosis present

## 2013-11-09 DIAGNOSIS — I5033 Acute on chronic diastolic (congestive) heart failure: Secondary | ICD-10-CM | POA: Diagnosis present

## 2013-11-09 DIAGNOSIS — I4891 Unspecified atrial fibrillation: Secondary | ICD-10-CM

## 2013-11-09 DIAGNOSIS — Z9981 Dependence on supplemental oxygen: Secondary | ICD-10-CM

## 2013-11-09 DIAGNOSIS — R0602 Shortness of breath: Secondary | ICD-10-CM | POA: Diagnosis present

## 2013-11-09 DIAGNOSIS — E876 Hypokalemia: Secondary | ICD-10-CM | POA: Diagnosis present

## 2013-11-09 DIAGNOSIS — J189 Pneumonia, unspecified organism: Secondary | ICD-10-CM | POA: Diagnosis present

## 2013-11-09 DIAGNOSIS — Z79899 Other long term (current) drug therapy: Secondary | ICD-10-CM

## 2013-11-09 DIAGNOSIS — Y95 Nosocomial condition: Secondary | ICD-10-CM | POA: Diagnosis present

## 2013-11-09 DIAGNOSIS — Z7952 Long term (current) use of systemic steroids: Secondary | ICD-10-CM

## 2013-11-09 DIAGNOSIS — Z880 Allergy status to penicillin: Secondary | ICD-10-CM

## 2013-11-09 DIAGNOSIS — I10 Essential (primary) hypertension: Secondary | ICD-10-CM | POA: Diagnosis present

## 2013-11-09 DIAGNOSIS — E222 Syndrome of inappropriate secretion of antidiuretic hormone: Secondary | ICD-10-CM | POA: Diagnosis not present

## 2013-11-09 DIAGNOSIS — E873 Alkalosis: Secondary | ICD-10-CM | POA: Diagnosis present

## 2013-11-09 DIAGNOSIS — F32A Depression, unspecified: Secondary | ICD-10-CM | POA: Diagnosis present

## 2013-11-09 DIAGNOSIS — E785 Hyperlipidemia, unspecified: Secondary | ICD-10-CM | POA: Diagnosis present

## 2013-11-09 DIAGNOSIS — R0989 Other specified symptoms and signs involving the circulatory and respiratory systems: Secondary | ICD-10-CM

## 2013-11-09 DIAGNOSIS — Z9841 Cataract extraction status, right eye: Secondary | ICD-10-CM

## 2013-11-09 DIAGNOSIS — I350 Nonrheumatic aortic (valve) stenosis: Secondary | ICD-10-CM

## 2013-11-09 DIAGNOSIS — R911 Solitary pulmonary nodule: Secondary | ICD-10-CM | POA: Diagnosis present

## 2013-11-09 DIAGNOSIS — Z885 Allergy status to narcotic agent status: Secondary | ICD-10-CM

## 2013-11-09 DIAGNOSIS — I5043 Acute on chronic combined systolic (congestive) and diastolic (congestive) heart failure: Secondary | ICD-10-CM | POA: Diagnosis not present

## 2013-11-09 DIAGNOSIS — I481 Persistent atrial fibrillation: Secondary | ICD-10-CM | POA: Diagnosis present

## 2013-11-09 DIAGNOSIS — Z9842 Cataract extraction status, left eye: Secondary | ICD-10-CM

## 2013-11-09 DIAGNOSIS — R0603 Acute respiratory distress: Secondary | ICD-10-CM | POA: Diagnosis present

## 2013-11-09 DIAGNOSIS — I509 Heart failure, unspecified: Secondary | ICD-10-CM

## 2013-11-09 LAB — COMPREHENSIVE METABOLIC PANEL
ALBUMIN: 2.9 g/dL — AB (ref 3.5–5.2)
ALT: 18 U/L (ref 0–35)
AST: 23 U/L (ref 0–37)
Alkaline Phosphatase: 80 U/L (ref 39–117)
Anion gap: 13 (ref 5–15)
BUN: 18 mg/dL (ref 6–23)
CO2: 32 mEq/L (ref 19–32)
CREATININE: 0.49 mg/dL — AB (ref 0.50–1.10)
Calcium: 8.5 mg/dL (ref 8.4–10.5)
Chloride: 88 mEq/L — ABNORMAL LOW (ref 96–112)
GFR calc Af Amer: >90 mL/min (ref >90)
GFR, EST NON AFRICAN AMERICAN: 86 mL/min — AB (ref >90)
Glucose, Bld: 128 mg/dL — ABNORMAL HIGH (ref 70–99)
Potassium: 3.6 mEq/L — ABNORMAL LOW (ref 3.7–5.3)
Sodium: 133 mEq/L — ABNORMAL LOW (ref 137–147)
Total Bilirubin: 1 mg/dL (ref 0.3–1.2)
Total Protein: 7.5 g/dL (ref 6.0–8.3)

## 2013-11-09 LAB — BLOOD GAS, ARTERIAL
Acid-Base Excess: 8.4 mmol/L — ABNORMAL HIGH (ref 0.0–2.0)
BICARBONATE: 32.6 meq/L — AB (ref 20.0–24.0)
DRAWN BY: 244801
Delivery systems: POSITIVE
EXPIRATORY PAP: 5
FIO2: 0.3 %
INSPIRATORY PAP: 12
Mode: POSITIVE
O2 Saturation: 95.8 %
PATIENT TEMPERATURE: 98.6
PH ART: 7.456 — AB (ref 7.350–7.450)
PO2 ART: 73.2 mmHg — AB (ref 80.0–100.0)
TCO2: 34 mmol/L (ref 0–100)
pCO2 arterial: 46.9 mmHg — ABNORMAL HIGH (ref 35.0–45.0)

## 2013-11-09 LAB — CBC WITH DIFFERENTIAL/PLATELET
BASOS ABS: 0 10*3/uL (ref 0.0–0.1)
BASOS PCT: 0 % (ref 0–1)
Eosinophils Absolute: 0 10*3/uL (ref 0.0–0.7)
Eosinophils Relative: 0 % (ref 0–5)
HEMATOCRIT: 33 % — AB (ref 36.0–46.0)
HEMOGLOBIN: 11.1 g/dL — AB (ref 12.0–15.0)
Lymphocytes Relative: 12 % (ref 12–46)
Lymphs Abs: 1.3 10*3/uL (ref 0.7–4.0)
MCH: 31.5 pg (ref 26.0–34.0)
MCHC: 33.6 g/dL (ref 30.0–36.0)
MCV: 93.8 fL (ref 78.0–100.0)
MONO ABS: 1.1 10*3/uL — AB (ref 0.1–1.0)
MONOS PCT: 10 % (ref 3–12)
Neutro Abs: 8.6 10*3/uL — ABNORMAL HIGH (ref 1.7–7.7)
Neutrophils Relative %: 78 % — ABNORMAL HIGH (ref 43–77)
Platelets: 304 10*3/uL (ref 150–400)
RBC: 3.52 MIL/uL — ABNORMAL LOW (ref 3.87–5.11)
RDW: 14.6 % (ref 11.5–15.5)
WBC: 11 10*3/uL — ABNORMAL HIGH (ref 4.0–10.5)

## 2013-11-09 LAB — I-STAT VENOUS BLOOD GAS, ED
ACID-BASE EXCESS: 10 mmol/L — AB (ref 0.0–2.0)
BICARBONATE: 36.7 meq/L — AB (ref 20.0–24.0)
O2 Saturation: 54 %
Patient temperature: 37
TCO2: 38 mmol/L (ref 0–100)
pCO2, Ven: 58.4 mmHg — ABNORMAL HIGH (ref 45.0–50.0)
pH, Ven: 7.406 — ABNORMAL HIGH (ref 7.250–7.300)
pO2, Ven: 30 mmHg (ref 30.0–45.0)

## 2013-11-09 LAB — URINALYSIS, ROUTINE W REFLEX MICROSCOPIC
BILIRUBIN URINE: NEGATIVE
Glucose, UA: NEGATIVE mg/dL
KETONES UR: NEGATIVE mg/dL
Leukocytes, UA: NEGATIVE
NITRITE: NEGATIVE
Protein, ur: 30 mg/dL — AB
SPECIFIC GRAVITY, URINE: 1.021 (ref 1.005–1.030)
Urobilinogen, UA: 1 mg/dL (ref 0.0–1.0)
pH: 6 (ref 5.0–8.0)

## 2013-11-09 LAB — URINE MICROSCOPIC-ADD ON

## 2013-11-09 LAB — GLUCOSE, CAPILLARY
Glucose-Capillary: 177 mg/dL — ABNORMAL HIGH (ref 70–99)
Glucose-Capillary: 181 mg/dL — ABNORMAL HIGH (ref 70–99)
Glucose-Capillary: 128 mg/dL — ABNORMAL HIGH (ref 70–99)

## 2013-11-09 LAB — I-STAT CHEM 8, ED
BUN: 20 mg/dL (ref 6–23)
CREATININE: 0.6 mg/dL (ref 0.50–1.10)
Calcium, Ion: 0.98 mmol/L — ABNORMAL LOW (ref 1.13–1.30)
Chloride: 90 mEq/L — ABNORMAL LOW (ref 96–112)
GLUCOSE: 136 mg/dL — AB (ref 70–99)
HCT: 37 % (ref 36.0–46.0)
Hemoglobin: 12.6 g/dL (ref 12.0–15.0)
Potassium: 3.5 mEq/L — ABNORMAL LOW (ref 3.7–5.3)
SODIUM: 132 meq/L — AB (ref 137–147)
TCO2: 33 mmol/L (ref 0–100)

## 2013-11-09 LAB — I-STAT CG4 LACTIC ACID, ED: Lactic Acid, Venous: 2.04 mmol/L (ref 0.5–2.2)

## 2013-11-09 LAB — STREP PNEUMONIAE URINARY ANTIGEN: Strep Pneumo Urinary Antigen: NEGATIVE

## 2013-11-09 LAB — I-STAT TROPONIN, ED: Troponin i, poc: 0.03 ng/mL (ref 0.00–0.08)

## 2013-11-09 LAB — PROTIME-INR
INR: 1.51 — ABNORMAL HIGH (ref 0.00–1.49)
Prothrombin Time: 18.2 seconds — ABNORMAL HIGH (ref 11.6–15.2)

## 2013-11-09 LAB — TROPONIN I: Troponin I: <0.3 ng/mL (ref <0.30)

## 2013-11-09 LAB — MRSA PCR SCREENING: MRSA BY PCR: NEGATIVE

## 2013-11-09 LAB — PRO B NATRIURETIC PEPTIDE: PRO B NATRI PEPTIDE: 1801 pg/mL — AB (ref 0–450)

## 2013-11-09 MED ORDER — FUROSEMIDE 10 MG/ML IJ SOLN
80.0000 mg | Freq: Two times a day (BID) | INTRAMUSCULAR | Status: DC
  Administered 2013-11-09 – 2013-11-11 (×5): 80 mg via INTRAVENOUS
  Filled 2013-11-09 (×8): qty 8

## 2013-11-09 MED ORDER — POTASSIUM CHLORIDE CRYS ER 20 MEQ PO TBCR
40.0000 meq | EXTENDED_RELEASE_TABLET | Freq: Two times a day (BID) | ORAL | Status: AC
  Administered 2013-11-09 – 2013-11-10 (×2): 40 meq via ORAL
  Filled 2013-11-09 (×2): qty 2

## 2013-11-09 MED ORDER — APIXABAN 5 MG PO TABS
5.0000 mg | ORAL_TABLET | Freq: Two times a day (BID) | ORAL | Status: DC
  Administered 2013-11-09 – 2013-11-22 (×26): 5 mg via ORAL
  Filled 2013-11-09 (×29): qty 1

## 2013-11-09 MED ORDER — VANCOMYCIN HCL IN DEXTROSE 750-5 MG/150ML-% IV SOLN
750.0000 mg | Freq: Two times a day (BID) | INTRAVENOUS | Status: DC
  Administered 2013-11-09 – 2013-11-12 (×6): 750 mg via INTRAVENOUS
  Filled 2013-11-09 (×8): qty 150

## 2013-11-09 MED ORDER — METOPROLOL TARTRATE 1 MG/ML IV SOLN
INTRAVENOUS | Status: AC
  Filled 2013-11-09: qty 5

## 2013-11-09 MED ORDER — GUAIFENESIN-DM 100-10 MG/5ML PO SYRP
10.0000 mL | ORAL_SOLUTION | Freq: Four times a day (QID) | ORAL | Status: DC | PRN
  Filled 2013-11-09: qty 10

## 2013-11-09 MED ORDER — SERTRALINE HCL 25 MG PO TABS
25.0000 mg | ORAL_TABLET | Freq: Every day | ORAL | Status: DC
  Administered 2013-11-10 – 2013-11-17 (×8): 25 mg via ORAL
  Filled 2013-11-09 (×10): qty 1

## 2013-11-09 MED ORDER — DILTIAZEM HCL 100 MG IV SOLR: 5.0000 mg/h | Freq: Once | INTRAVENOUS | Status: DC

## 2013-11-09 MED ORDER — POTASSIUM CHLORIDE 10 MEQ/100ML IV SOLN
10.0000 meq | INTRAVENOUS | Status: AC
  Administered 2013-11-09 (×2): 10 meq via INTRAVENOUS
  Filled 2013-11-09 (×2): qty 100

## 2013-11-09 MED ORDER — FUROSEMIDE 10 MG/ML IJ SOLN
80.0000 mg | Freq: Once | INTRAMUSCULAR | Status: AC
  Administered 2013-11-09: 80 mg via INTRAVENOUS
  Filled 2013-11-09: qty 8

## 2013-11-09 MED ORDER — CEFEPIME HCL 1 G IJ SOLR
1.0000 g | Freq: Two times a day (BID) | INTRAMUSCULAR | Status: DC
  Administered 2013-11-09 – 2013-11-12 (×6): 1 g via INTRAVENOUS
  Filled 2013-11-09 (×9): qty 1

## 2013-11-09 MED ORDER — METOPROLOL TARTRATE 25 MG PO TABS
37.5000 mg | ORAL_TABLET | Freq: Two times a day (BID) | ORAL | Status: DC
  Filled 2013-11-09 (×2): qty 1

## 2013-11-09 MED ORDER — METOPROLOL TARTRATE 1 MG/ML IV SOLN
5.0000 mg | Freq: Two times a day (BID) | INTRAVENOUS | Status: DC
  Administered 2013-11-09 – 2013-11-10 (×2): 5 mg via INTRAVENOUS
  Filled 2013-11-09 (×4): qty 5

## 2013-11-09 MED ORDER — NITROGLYCERIN 0.4 MG SL SUBL
0.4000 mg | SUBLINGUAL_TABLET | SUBLINGUAL | Status: DC | PRN
  Administered 2013-11-09: 0.4 mg via SUBLINGUAL

## 2013-11-09 MED ORDER — LEVALBUTEROL HCL 0.63 MG/3ML IN NEBU
0.6300 mg | INHALATION_SOLUTION | RESPIRATORY_TRACT | Status: DC | PRN
  Administered 2013-11-10: 0.63 mg via RESPIRATORY_TRACT
  Filled 2013-11-09: qty 3

## 2013-11-09 NOTE — ED Provider Notes (Signed)
Medical screening examination/treatment/procedure(s) were conducted as a shared visit with non-physician practitioner(s) and myself.  I personally evaluated the patient during the encounter.   EKG Interpretation   Date/Time:  Tuesday November 09 2013 06:48:24 EDT Ventricular Rate:  131 PR Interval:    QRS Duration: 104 QT Interval:  290 QTC Calculation: 428 R Axis:   92 Text Interpretation:  Age not entered, assumed to be  80 years old for  purpose of ECG interpretation Atrial fibrillation Paired ventricular  premature complexes Borderline right axis deviation Repol abnrm suggests  ischemia, diffuse leads a fib RVR. Lateral depressions c/w ischemia.  Confirmed by Johnney Killian, Hen[place], Jeannie Done [phone]) on 11/09/2013 8:31:03 AM       Charlesetta Shanks, MD 11/09/13 [phone]

## 2013-11-09 NOTE — ED Notes (Signed)
Here by [facility] from [facility], sudden onset sob at 0430, arrives in respiratory distress, (denies: pain or nv), arrives to EDP, EDPA, &  RNx2. RT and CXR paged on arrival. Son arrived to [facility] shortly after arrival. DNR present. Albuterol/ atrovent 10mg /1mg  given PTA and solumedrol 125mg . Afib on monitor, HR 130. Wheezing & pedal edema noted. Arrives alert, interactive, tachypneic, retractions, orthopneic.

## 2013-11-09 NOTE — ED Notes (Signed)
Placed temp foley into patient dark cloudy urine in return size 16 french

## 2013-11-09 NOTE — Progress Notes (Signed)
ANTIBIOTIC CONSULT NOTE - INITIAL  Pharmacy Consult for Vancomycin, Cefepime Indication: pneumonia  Allergies  Allergen Reactions  . Codeine     Unknown  . Cucumber Extract Nausea And Vomiting  . Diuretic [Buchu-[place]sil[place]-Ch Grass-Hydran]     Unknown  . Lipitor [Atorvastatin Calcium]     Unknown  . Penicillins Other (See Comments)    unknown  . Pravachol     Unknown    Patient Measurements: 92 kg  Labs:  Recent Labs  11/09/13 0658 11/09/13 0707  WBC 11.0*  --   HGB 11.1* 12.6  PLT 304  --   CREATININE 0.49* 0.60   The CrCl is unknown because both a height and weight (above a minimum accepted value) are required for this calculation. No results found for this basename: VANCOTROUGH, Corlis Leak, VANCORANDOM, GENTTROUGH, GENTPEAK, GENTRANDOM, TOBRATROUGH, TOBRAPEAK, TOBRARND, AMIKACINPEAK, AMIKACINTROU, AMIKACIN,  in the last 72 hours   Microbiology: Recent Results (from the past 720 hour(s))  URINE CULTURE     Status: None   Collection Time    10/11/13  1:25 PM      Result Value Ref Range Status   Specimen Description URINE, CLEAN CATCH   Final   Special Requests NONE   Final   Culture  Setup Time     Final   Value: 10/11/2013 20:09     Performed at [facility]     Final   Value: 70,000 COLONIES/ML     Performed at [facility]   Culture     Final   Value: STAPHYLOCOCCUS SPECIES (COAGULASE NEGATIVE)     Note: RIFAMPIN AND GENTAMICIN SHOULD NOT BE USED AS SINGLE DRUGS FOR TREATMENT OF STAPH INFECTIONS.     Performed at [facility]   Report Status 10/15/2013 FINAL   Final   Organism ID, Bacteria STAPHYLOCOCCUS SPECIES (COAGULASE NEGATIVE)   Final  CULTURE, BLOOD (ROUTINE X 2)     Status: None   Collection Time    10/11/13  7:33 PM      Result Value Ref Range Status   Specimen Description BLOOD RIGHT ARM   Final   Special Requests BOTTLES DRAWN AEROBIC AND ANAEROBIC 10CC   Final   Culture  Setup Time     Final   Value:  10/12/2013 04:12     Performed at [facility]   Culture     Final   Value: NO GROWTH 5 DAYS     Performed at [facility]   Report Status 10/18/2013 FINAL   Final  CULTURE, BLOOD (ROUTINE X 2)     Status: None   Collection Time    10/11/13  7:43 PM      Result Value Ref Range Status   Specimen Description BLOOD LEFT ARM   Final   Special Requests BOTTLES DRAWN AEROBIC AND ANAEROBIC 5CC   Final   Culture  Setup Time     Final   Value: 10/12/2013 04:13     Performed at [facility]   Culture     Final   Value: NO GROWTH 5 DAYS     Performed at [facility]   Report Status 10/18/2013 FINAL   Final  RESPIRATORY VIRUS PANEL     Status: None   Collection Time    10/14/13  4:50 PM      Result Value Ref Range Status   Source - [facility]AN NASAL SWAB   Corrected   Comment: CORRECTED ON  08/28 AT 2020: PREVIOUSLY REPORTED AS NASAL SWAB   Respiratory Syncytial Virus A NOT DETECTED   Final   Respiratory Syncytial Virus B NOT DETECTED   Final   Influenza A NOT DETECTED   Final   Influenza B NOT DETECTED   Final   Parainfluenza 1 NOT DETECTED   Final   Parainfluenza 2 NOT DETECTED   Final   Parainfluenza 3 NOT DETECTED   Final   Metapneumovirus NOT DETECTED   Final   Rhinovirus NOT DETECTED   Final   Adenovirus NOT DETECTED   Final   Influenza A H1 NOT DETECTED   Final   Influenza A H3 NOT DETECTED   Final   Comment: (NOTE)           Normal Reference Range for each Analyte: NOT DETECTED     Testing performed using the Luminex xTAG Respiratory Viral Panel test     kit.     The analytical performance characteristics of this assay have been     determined by [facility].  The modifications have not been     cleared or approved by the FDA. This assay has been validated pursuant     to the CLIA regulations and is used for clinical purposes.     Performed at [facility] History: Past Medical History  Diagnosis Date  .  Hyperlipidemia   . Arrhythmia     PVC  . Retinal detachment   . Renal disorder   . Allergy   . Occlusion and stenosis of carotid artery without mention of cerebral infarction 03/24/2012  . Pneumonia, organism unspecified 11/192013  . Depressive disorder, not elsewhere classified 03/05/2011  . Other premature beats 03/05/2011  . Other seborrheic keratosis 11/20/2010  . Syncope and collapse 10/02/2010  . Palpitations 11/21/2009  . Unspecified essential hypertension 06/27/2009  . Cardiomegaly 06/27/2009  . Other generalized ischemic cerebrovascular disease 06/27/2009  . Dizziness and giddiness 06/27/2009  . Unspecified vitamin D deficiency 05/16/2009  . Family history of sudden cardiac death (SCD) December 08, 2008  . Rash and other nonspecific skin eruption 07/05/2008  . Edema 03/15/2008  . Obesity, unspecified 07/25/2006  . Varicose veins of lower extremities with inflammation 03/16/2003  . Shortness of breath     Assessment: 80 year old female to begin Vancomycin and Cefepime for HCAP Scr stable  Goal of Therapy:  Vancomycin trough level 15-20 mcg/ml Appropriate cefepime dosing  Plan:  Vancomycin 750 mg iv Q 12 Cefepime 1 g iv Q 12 Follow up Scr, cultures, progress, fever trend  Thank you Anette Guarneri, PharmD [phone]   11/09/2013,12:28 PM

## 2013-11-09 NOTE — Progress Notes (Signed)
Pt placed back on BiPAP for night (IPAP 12, EPAP 5, 40% FiO2, RR 10).  Currently pt is stable and tolerating BiPAP well.  RT will monitor as needed.

## 2013-11-09 NOTE — Consult Note (Signed)
Admit date: 11/09/2013 Referring Physician  Dr. Karleen Hampshire Primary Physician GREEN, Viviann Spare, MD Primary Cardiologist  Dr. Percival Spanish Reason for Consultation  AFIB with RVR  HPI: 80 year old with paroxysmal atrial fibrillation on Eliquis previously hospitalized from 09/24/13 with pneumonia/A. fib with RVR, cardioverted on 10/01/13 successfully however reverted back to atrial fibrillation on 10/05/13 here with increasing shortness of breath, A. fib with RVR.   She was just seen yesterday in the cardiology clinic by Tarri Fuller, PA who had recently increased her Lasix on September 11 to 80 mg twice a day. She reported that she was feeling a little bit better, still needing oxygen however. Her weight was down 5 pounds. Her pulse in clinic was 82 and she was regular. On a prior visit, Gaspar Bidding notes that her heart rate appears regular however she was in atrial fibrillation at her last visit by EKG on 10/05/13. Her shortness of breath was being in part attributed to diastolic heart failure. She also sees pulmonary medicine.   PMH:   Past Medical History  Diagnosis Date  . Hyperlipidemia   . Arrhythmia     PVC  . Retinal detachment   . Renal disorder   . Allergy   . Occlusion and stenosis of carotid artery without mention of cerebral infarction 03/24/2012  . Pneumonia, organism unspecified 11/192013  . Depressive disorder, not elsewhere classified 03/05/2011  . Other premature beats 03/05/2011  . Other seborrheic keratosis 11/20/2010  . Syncope and collapse 10/02/2010  . Palpitations 11/21/2009  . Unspecified essential hypertension 06/27/2009  . Cardiomegaly 06/27/2009  . Other generalized ischemic cerebrovascular disease 06/27/2009  . Dizziness and giddiness 06/27/2009  . Unspecified vitamin D deficiency 05/16/2009  . Family history of sudden cardiac death (SCD) 2008-12-04  . Rash and other nonspecific skin eruption 07/05/2008  . Edema 03/15/2008  . Obesity, unspecified 07/25/2006  . Varicose veins of lower  extremities with inflammation 03/16/2003  . Shortness of breath     PSH:   Past Surgical History  Procedure Laterality Date  . Cholecystectomy  1983  . Cataract extraction Bilateral     Dr. [name]  . Tonsillectomy    . Keratosis    . Retinal detachment surgery Left   . Cardioversion N/A 10/01/2013    Procedure: CARDIOVERSION;  Surgeon: Sanda Klein, MD;  Location: [facility] ENDOSCOPY;  Service: Cardiovascular;  Laterality: N/A;  . Tee without cardioversion N/A 10/01/2013    Procedure: TRANSESOPHAGEAL ECHOCARDIOGRAM (TEE);  Surgeon: Sanda Klein, MD;  Location: [facility] ENDOSCOPY;  Service: Cardiovascular;  Laterality: N/A;   Allergies:  Codeine; Cucumber extract; Diuretic; Lipitor; Penicillins; and Pravachol Prior to Admit Meds:   Prior to Admission medications   Medication Sig Start Date End Date Taking? Authorizing Provider  albuterol (PROVENTIL) (2.5 MG/3ML) 0.083% nebulizer solution Take 2.5 mg by nebulization every 6 (six) hours as needed for wheezing or shortness of breath.   Yes Historical Provider, MD  apixaban (ELIQUIS) 5 MG TABS tablet Take 1 tablet (5 mg total) by mouth 2 (two) times daily. 10/02/13  Yes Tarri Fuller, PA-C  diltiazem (CARDIZEM CD) 240 MG 24 hr capsule Take 1 capsule (240 mg total) by mouth daily. 10/20/13  Yes Geradine Girt, DO  furosemide (LASIX) 80 MG tablet Take 80 mg by mouth 2 (two) times daily.   Yes Historical Provider, MD  guaiFENesin-dextromethorphan (ROBITUSSIN DM) 100-10 MG/5ML syrup Take 10 mLs by mouth every 6 (six) hours as needed for cough.   Yes Historical Provider, MD  levalbuterol Penne Lash) 0.63  MG/3ML nebulizer solution Take 3 mLs (0.63 mg total) by nebulization every 4 (four) hours as needed for wheezing or shortness of breath. 10/20/13  Yes Geradine Girt, DO  metoprolol (LOPRESSOR) 12.5 mg TABS tablet Take 1.5 tablets (37.5 mg total) by mouth 2 (two) times daily. 10/20/13  Yes Geradine Girt, DO  potassium chloride SA (K-DUR,KLOR-CON) 20 MEQ tablet Take 1  tablet (20 mEq total) by mouth 2 (two) times daily. 10/29/13  Yes Tarri Fuller, PA-C  sertraline (ZOLOFT) 25 MG tablet Take 25 mg by mouth daily.   Yes Historical Provider, MD  vitamin E 1000 UNIT capsule Take 1,000 Units by mouth daily.   Yes Historical Provider, MD  azithromycin (ZITHROMAX) 500 MG tablet Take 500 mg by mouth daily. For 3 days. Started 10/21/13. Finsihed 10/24/13    Historical Provider, MD  OXYGEN Inhale 2 L into the lungs daily.    Historical Provider, MD  predniSONE (DELTASONE) 5 MG tablet Take 5 mg by mouth daily with breakfast. For 3 days.    Historical Provider, MD   Fam HX:    Family History  Problem Relation Age of Onset  . Arrhythmia      Long QT in first degree relatives,  . Heart attack Father   . Heart disease Father   . Heart attack Brother   . Heart disease Brother   . Pneumonia Mother    Social HX:    History   Social History  . Marital Status: Widowed    Spouse Name: N/A    Number of Children: N/A  . Years of Education: N/A   Occupational History  . Housewife    Social History Main Topics  . Smoking status: Never Smoker   . Smokeless tobacco: Never Used  . Alcohol Use: No  . Drug Use: No  . Sexual Activity: No   Other Topics Concern  . Not on file   Social History Narrative   The patient is widowed, husband died March 24, 2011.  She has two children and two     grandchildren.     She never smoked cigarettes    Does not drink alcohol.    Drinks 2 cups caffeine coffee,    L[facility] at [facility] since 09/03/2004   Living Will   Exercise: walks daily           ROS:  All 11 ROS were addressed and are negative except what is stated in the HPI   Physical Exam: Blood pressure 132/80, pulse 126, temperature 98.8 F (37.1 C), temperature source Axillary, resp. rate 25, SpO2 97.00%.   General: Well developed, well nourished, BiPAP, sleepy Head:  No xanthomas.   Normal cephalic and atramatic  Lungs:   Rales bilaterally, BiPAP noise. Heart:    Irreg irreg, tachy (currently 106). Pulses are 2+ & equal. 2/6 systolic right upper sternal border murmur, no rubs, gallops.  No carotid bruit. No JVD.  No abdominal bruits.  Abdomen: Bowel sounds are positive, abdomen soft and non-tender without masses. No hepatosplenomegaly. Msk: Unable to assess strength and tone for age. Extremities:  No clubbing, cyanosis or edema.  DP +1 Neuro: Sleeping GU: Deferred Rectal: Deferred Psych:  Sleeping      Labs: Lab Results  Component Value Date   WBC 11.0* 11/09/2013   HGB 12.6 11/09/2013   HCT 37.0 11/09/2013   MCV 93.8 11/09/2013   PLT 304 11/09/2013     Recent Labs Lab 11/09/13 0658 11/09/13 0707  NA 133* 132*  K 3.6* 3.5*  CL 88* 90*  CO2 32  --   BUN 18 20  CREATININE 0.49* 0.60  CALCIUM 8.5  --   PROT 7.5  --   BILITOT 1.0  --   ALKPHOS 80  --   ALT 18  --   AST 23  --   GLUCOSE 128* 136*    Lab Results  Component Value Date   CHOL 191 09/25/2013   HDL 86 09/25/2013   LDLCALC 95 09/25/2013   TRIG 48 09/25/2013      Radiology:  Dg Chest Port 1 View  11/09/2013   CLINICAL DATA:  Shortness of breath  EXAM: PORTABLE CHEST - 1 VIEW  COMPARISON:  Prior radiograph from 10/29/2013  FINDINGS: Cardiomegaly is grossly stable relative to prior exam. Mediastinal silhouette within normal limits. Atherosclerotic calcifications present within the aortic arch.  Lungs are mildly hypoinflated. Small bilateral pleural effusions are present, left larger than right. There are widespread multi focal parenchymal patchy opacities throughout both lungs, right worse than left. Diffuse interstitial thickening with pulmonary vascular congestion is present. Altogether, these findings are favored to reflect diffuse pulmonary edema, although superimposed infection is not entirely excluded. Bibasilar atelectasis is present.  No pneumothorax.  No acute osseus abnormality.  IMPRESSION: 1. Extensive multi focal irregular patchy airspace opacities throughout the  bilateral lungs, right greater than left, worsened as compared to most recent radiograph from 10/29/2013. Findings are favored to reflect moderate diffuse pulmonary edema, although superimposed infection/pneumonia could also be considered in the correct clinical setting. 2. Bilateral pleural effusions, left greater than right, with associated bibasilar atelectasis. 3. Stable cardiomegaly.   Electronically Signed   By: Jeannine Boga M.D.   On: 11/09/2013 07:00   Personally viewed.  EKG: 11/09/13-atrial fibrillation with heart rate 131 beats per minute, rapid ventricular response with nonspecific ST-T wave changes, poor R wave progression  Personally viewed.   Echocardiogram: Normal ejection fraction, mildly dilated left atrium, mild to moderate aortic stenosis, no left atrial appendage thrombus on transesophageal echocardiogram on 10/01/13. Transthoracic echocardiogram performed on 09/24/13.  Cardioversion: 10/01/13-successful.  ASSESSMENT/PLAN:    80 year old female with paroxysmal atrial fibrillation once again atrial fibrillation with rapid ventricular response admitted with acute respiratory failure.  1. Paroxysmal atrial fibrillation, now persistent-currently on Eliquis. Recent TEE cardioversion on 10/01/13 was successful however repeat EKG on 10/05/13 demonstrates atrial fibrillation. She was improving at last visit after diuresis in the outpatient setting while demonstrating rate controlled atrial fibrillation. Since she has failed cardioversion, I would like to trial more aggressive rate control with continued diuresis, IV Lasix 80 mg twice a day with hopeful output of 2 L daily. We will be careful given advanced age. Replete potassium, already ordered. Ejection fraction is reassuring. Normal.  2. Acute on chronic diastolic heart failure-IV Lasix, rate controlling atrial fibrillation, priority. She has failed cardioversion. If after adequate diuresis, she still continues to demonstrate  shortness of breath, one could consider rhythm control once again but this would require use of antiarrhythmic such as amiodarone. I would like to try rate control strategy currently.  3. Acute respiratory failure-hypoxia noted. Chest x-ray demonstrates severe bilateral airspace disease, likely pulmonary edema. Aggressive diuresis. She is currently on BiPAP and quite sleepy. DO NOT RESUSCITATE noted. Continuing with aggressive IV diuresis. Other therapies per primary team.  Candee Furbish, MD  11/09/2013  11:20 AM

## 2013-11-09 NOTE — Progress Notes (Signed)
Utilization Review Completed.  

## 2013-11-09 NOTE — ED Notes (Signed)
Attempted Report 

## 2013-11-09 NOTE — ED Provider Notes (Signed)
CSN: [id number]     Arrival date & time 11/09/13  3419 History   First MD Initiated Contact with Patient 11/09/13 [phone]     Chief Complaint  Patient presents with  . Shortness of Breath     (Consider location/radiation/quality/duration/timing/severity/associated sxs/prior Treatment) HPI Comments: Julie Haas is a(n) 80 y.o. female who presents via EMS on NRB for respiratory distress. he has a PMH of HTN, HPL, CHF, A Fib on eliquis, admitted 08/07-08/15 by cardiology service for a fib RVR, pneumonia. S/p TEE/DCCV. Echocardiogram on August 7 revealed an ejection fraction of 55-60% with mild LVH. Patient is followed by Dr. Percival Spanish and PA Samara Snide. She had her Lasix increased on 9/11 to 80 mg BID as she has had increase in swelling and weight,. She is also currently on Prednisone taper which may have been from her Recent admission for pneumonia. Patient began having worsening shortness of breath this morning. She weighs 85% on room air per paramedics. Patient normally is on 2 L of oxygen. She denies a history of COPD. Patient denies fevers, chills, myalgias, arthralgias or other signs of systemic infection. She is unable to come he along she's had a cough. Paramedics report sputum production. She is unable to come me if she's had weight gain. Patient in active A. fib with RVR upon arrival and respiratory distress with sig work of breathing.  Patient is a 80 y.o. female presenting with shortness of breath.  Shortness of Breath Severity:  Severe Onset quality:  Gradual Duration:  6 hours Timing:  Constant Progression:  Worsening Chronicity:  Recurrent Context comment:  Unknown Relieved by:  Nothing Worsened by:  Nothing tried Ineffective treatments: nasal o2, solumedrol, NRB, Albuterol. Associated symptoms: cough, sputum production and wheezing   Associated symptoms: no abdominal pain, no chest pain and no fever   Associated symptoms comment:  LE edema, productive cough, Abdominal  swelling     Past Medical History  Diagnosis Date  . Hyperlipidemia   . Arrhythmia     PVC  . Retinal detachment   . Renal disorder   . Allergy   . Occlusion and stenosis of carotid artery without mention of cerebral infarction 03/24/2012  . Pneumonia, organism unspecified 11/192013  . Depressive disorder, not elsewhere classified 03/05/2011  . Other premature beats 03/05/2011  . Other seborrheic keratosis 11/20/2010  . Syncope and collapse 10/02/2010  . Palpitations 11/21/2009  . Unspecified essential hypertension 06/27/2009  . Cardiomegaly 06/27/2009  . Other generalized ischemic cerebrovascular disease 06/27/2009  . Dizziness and giddiness 06/27/2009  . Unspecified vitamin D deficiency 05/16/2009  . Family history of sudden cardiac death (SCD) 2008-12-29  . Rash and other nonspecific skin eruption 07/05/2008  . Edema 03/15/2008  . Obesity, unspecified 07/25/2006  . Varicose veins of lower extremities with inflammation 03/16/2003  . Shortness of breath    Past Surgical History  Procedure Laterality Date  . Cholecystectomy  1983  . Cataract extraction Bilateral     Dr. [name]  . Tonsillectomy    . Keratosis    . Retinal detachment surgery Left   . Cardioversion N/A 10/01/2013    Procedure: CARDIOVERSION;  Surgeon: Sanda Klein, MD;  Location: [facility] ENDOSCOPY;  Service: Cardiovascular;  Laterality: N/A;  . Tee without cardioversion N/A 10/01/2013    Procedure: TRANSESOPHAGEAL ECHOCARDIOGRAM (TEE);  Surgeon: Sanda Klein, MD;  Location: [facility] ENDOSCOPY;  Service: Cardiovascular;  Laterality: N/A;   Family History  Problem Relation Age of Onset  . Arrhythmia      Long  QT in first degree relatives,  . Heart attack Father   . Heart disease Father   . Heart attack Brother   . Heart disease Brother   . Pneumonia Mother    History  Substance Use Topics  . Smoking status: Never Smoker   . Smokeless tobacco: Never Used  . Alcohol Use: No   OB History   Grav Para Term Preterm  Abortions TAB SAB Ect Mult Living                 Review of Systems  Unable to perform ROS: Acuity of condition  Constitutional: Negative for fever.  Respiratory: Positive for cough, sputum production, shortness of breath and wheezing.   Cardiovascular: Negative for chest pain.  Gastrointestinal: Negative for abdominal pain.      Allergies  Codeine; Cucumber extract; Diuretic; Lipitor; Penicillins; and Pravachol  Home Medications   Prior to Admission medications   Medication Sig Start Date End Date Taking? Authorizing Provider  apixaban (ELIQUIS) 5 MG TABS tablet Take 1 tablet (5 mg total) by mouth 2 (two) times daily. 10/02/13   Tarri Fuller, PA-C  diltiazem (CARDIZEM CD) 240 MG 24 hr capsule Take 1 capsule (240 mg total) by mouth daily. 10/20/13   Geradine Girt, DO  furosemide (LASIX) 80 MG tablet Take 80 mg by mouth 2 (two) times daily.    Historical Provider, MD  levalbuterol Penne Lash) 0.63 MG/3ML nebulizer solution Take 3 mLs (0.63 mg total) by nebulization every 4 (four) hours as needed for wheezing or shortness of breath. 10/20/13   Geradine Girt, DO  metoprolol (LOPRESSOR) 12.5 mg TABS tablet Take 1.5 tablets (37.5 mg total) by mouth 2 (two) times daily. 10/20/13   Geradine Girt, DO  OXYGEN Inhale 2 L into the lungs daily.    Historical Provider, MD  potassium chloride SA (K-DUR,KLOR-CON) 20 MEQ tablet Take 1 tablet (20 mEq total) by mouth 2 (two) times daily. 10/29/13   Tarri Fuller, PA-C  sertraline (ZOLOFT) 25 MG tablet Take 25 mg by mouth daily.    Historical Provider, MD  vitamin E 1000 UNIT capsule Take 1,000 Units by mouth daily.    Historical Provider, MD   BP 110/61  Pulse 132  Temp(Src) 98.2 F (36.8 C) (Axillary)  Resp 19  SpO2 96% Physical Exam  Nursing note and vitals reviewed. Constitutional: She is oriented to person, place, and time. She appears well-developed and well-nourished. She appears distressed.  HENT:  Head: Normocephalic.  Eyes: Conjunctivae are  normal. Pupils are equal, round, and reactive to light.  Neck: Normal range of motion. JVD present.  Cardiovascular:  Afib rate -120-130 #3+ pitting edema past the knees  Pulmonary/Chest: She is in respiratory distress. She has wheezes. She has rales.  Abdominal: She exhibits distension. There is no tenderness.  Firm, swollen and distended abdomen  Neurological: She is alert and oriented to person, place, and time.    ED Course  Procedures (including critical care time) Labs Review Labs Reviewed  CBC WITH DIFFERENTIAL - Abnormal; Notable for the following:    WBC 11.0 (*)    RBC 3.52 (*)    Hemoglobin 11.1 (*)    HCT 33.0 (*)    Neutrophils Relative % 78 (*)    Neutro Abs 8.6 (*)    Monocytes Absolute 1.1 (*)    All other components within normal limits  I-STAT CHEM 8, ED - Abnormal; Notable for the following:    Sodium 132 (*)  Potassium 3.5 (*)    Chloride 90 (*)    Glucose, Bld 136 (*)    Calcium, Ion 0.98 (*)    All other components within normal limits  I-STAT VENOUS BLOOD GAS, ED - Abnormal; Notable for the following:    pH, Ven 7.406 (*)    pCO2, Ven 58.4 (*)    Bicarbonate 36.7 (*)    Acid-Base Excess 10.0 (*)    All other components within normal limits  COMPREHENSIVE METABOLIC PANEL  PRO B NATRIURETIC PEPTIDE  URINALYSIS, ROUTINE W REFLEX MICROSCOPIC  PROTIME-INR  BLOOD GAS, VENOUS  I-STAT CG4 LACTIC ACID, ED  Randolm Idol, ED    Imaging Review Dg Chest Port 1 View  11/09/2013   CLINICAL DATA:  Shortness of breath  EXAM: PORTABLE CHEST - 1 VIEW  COMPARISON:  Prior radiograph from 10/29/2013  FINDINGS: Cardiomegaly is grossly stable relative to prior exam. Mediastinal silhouette within normal limits. Atherosclerotic calcifications present within the aortic arch.  Lungs are mildly hypoinflated. Small bilateral pleural effusions are present, left larger than right. There are widespread multi focal parenchymal patchy opacities throughout both lungs, right  worse than left. Diffuse interstitial thickening with pulmonary vascular congestion is present. Altogether, these findings are favored to reflect diffuse pulmonary edema, although superimposed infection is not entirely excluded. Bibasilar atelectasis is present.  No pneumothorax.  No acute osseus abnormality.  IMPRESSION: 1. Extensive multi focal irregular patchy airspace opacities throughout the bilateral lungs, right greater than left, worsened as compared to most recent radiograph from 10/29/2013. Findings are favored to reflect moderate diffuse pulmonary edema, although superimposed infection/pneumonia could also be considered in the correct clinical setting. 2. Bilateral pleural effusions, left greater than right, with associated bibasilar atelectasis. 3. Stable cardiomegaly.   Electronically Signed   By: Jeannine Boga M.D.   On: 11/09/2013 07:00     EKG Interpretation None       Date: 11/09/2013  Rate: 131  Rhythm: atrial fibrillation  QRS Axis: normal  Intervals: normal  ST/T Wave abnormalities: early repolarization  Conduction Disutrbances:none  Narrative Interpretation:   Old EKG Reviewed: unchanged     CRITICAL CARE Performed by: Margarita Mail   Total critical care time: 50  Critical care time was exclusive of separately billable procedures and treating other patients.  Critical care was necessary to treat or prevent imminent or life-threatening deterioration.  Critical care was time spent personally by me on the following activities: development of treatment plan with patient and/or surrogate as well as nursing, discussions with consultants, evaluation of patient's response to treatment, examination of patient, obtaining history from patient or surrogate, ordering and performing treatments and interventions, ordering and review of laboratory studies, ordering and review of radiographic studies, pulse oximetry and re-evaluation of patient's condition.  MDM    Final diagnoses:  None   7:33 AM Filed Vitals:   11/09/13 0707 11/09/13 0715 11/09/13 0726 11/09/13 0728  BP:  103/62  103/62  Pulse:  111  128  Temp:      TempSrc:      Resp:  22  21  SpO2: 96% 97% 98% 100%    Patient arrives in respiratory distress. She is sitting in chair visit with the attending provider. Patient given sublingual nitroglycerin, BiPAP, albuterol discontinued. He seems to have increased RVR with her atrial fibrillation. Patient's initial blood pressure elevated at 140/100, per EMS with significant drop in blood pressure with sublingual nitroglycerin. Patient's x-ray shows diffuse patchy infiltrates most suspicious for acute CHF exacerbation  which appears to fit the patient's clinical picture. Differential includes pneumonia, COPD/asthma exacerbation, pulmonary embolus. Although these are less likely. Patient unable to give a clear history due to her acuity. I ordered IV Lasix 80 mg and Foley catheter. I've asked the nurses to reduce this in 20 mg boluses and evaluate blood pressures every 30 minutes before giving the patient the next bolus. Patient will require close monitoring due to her tenuous situation. We'll evaluate for weight control when her blood pressures improve.   8:20 AM Filed Vitals:   11/09/13 0728 11/09/13 0730 11/09/13 0745 11/09/13 0800  BP: 103/62 124/77 103/60 143/74  Pulse: 128 89 115 134  Temp:      TempSrc:      Resp: 21 17 19  37  SpO2: 100% 98% 98% 94%   Labs Reviewed: mild hypokalemia repleted with IV 20 MEQ. Mild hypontremia/chloremia likely secondary to hyperglycemia.  VBG shows signs of chronic hyprecarbia.  Her cbc shows slightly elevated leukocytosis with left shift.  Probnp elevated form previous of 400 to 1801.  8:42 AM Patient begun on cardizem drip. Accepted for admission by Dr. Karleen Hampshire to admit. Cardiology will consult Pt stable in ED with no significant deterioration in condition.   Margarita Mail, PA-C 11/09/13 [phone]

## 2013-11-09 NOTE — ED Notes (Signed)
Lactic acid results given to Dr. Claudine Mouton

## 2013-11-09 NOTE — ED Provider Notes (Signed)
I have assessed the patient and reviewed her history present illness and physical examination. Treatment had been initiated prior to my arrival and time seen the patient on BiPAP. She has controlled respiratory distress. She is able to speak in short sentences. Patient has Rales bilaterally. The monitor shows atrial fibrillation range 110s to 130s. Heart sounds are distant and tachycardic. The patient's skin is warm and dry. At this point we will continue management for respiratory distress diuresing with Lasix and initiating rate control as tolerated by her blood pressure. The plan will be for admission.  Charlesetta Shanks, MD 11/09/13 [phone]

## 2013-11-09 NOTE — ED Notes (Signed)
MD at bedside. 

## 2013-11-09 NOTE — Progress Notes (Signed)
Pt's heart rate is a little high anywhere from 101-118. Called and reported to Dr. Karleen Hampshire and also reported cardizem was not started in ED as it was reported pt's blood pressure too low. Never started on floor as pt's heart rate was only low 100's , 101-110 as day has went by HR increased and pt unable to take po metoprolol as she is on continuous bipap and lethargic.  B/p systolic 96 and reported to Dr. Karleen Hampshire therefore MD placing order for IV metoprolol.

## 2013-11-09 NOTE — H&P (Addendum)
[facility] History and Physical  [name] QIH:[id number] DOB: Feb 09, 1926 DOA: 11/09/2013  Referring 64 PCP: Estill Dooms, MD   Chief Complaint:worseing sob since 4 am this am.   HPI: Julie Haas is a 80 y.o. female with prior h/o hypertension, PAF, with h/o diastolic heart failure, comes in for worsening sob earlier this am, brought in to ED was found to be hypoxic requiring BIPAP. Not much h/o obtained from the patient, no family at bedside. As per EDP, pt came from [facility] with acute respiratory failure. On arrival to ED, she was found to be in AFIB with RVR and she was started on cardizem drip. Her CXR revealed pulmonary edema. She was started on lasix and referred the patient to medical service for admission . Cardiology consulted for further recommendations.    Review of Systems:  Could not be obtained.   Past Medical History  Diagnosis Date  . Hyperlipidemia   . Arrhythmia     PVC  . Retinal detachment   . Renal disorder   . Allergy   . Occlusion and stenosis of carotid artery without mention of cerebral infarction 03/24/2012  . Pneumonia, organism unspecified 11/192013  . Depressive disorder, not elsewhere classified 03/05/2011  . Other premature beats 03/05/2011  . Other seborrheic keratosis 11/20/2010  . Syncope and collapse 10/02/2010  . Palpitations 11/21/2009  . Unspecified essential hypertension 06/27/2009  . Cardiomegaly 06/27/2009  . Other generalized ischemic cerebrovascular disease 06/27/2009  . Dizziness and giddiness 06/27/2009  . Unspecified vitamin D deficiency 05/16/2009  . Family history of sudden cardiac death (SCD) Dec 27, 2008  . Rash and other nonspecific skin eruption 07/05/2008  . Edema 03/15/2008  . Obesity, unspecified 07/25/2006  . Varicose veins of lower extremities with inflammation 03/16/2003  . Shortness of breath    Past Surgical History  Procedure Laterality Date  . Cholecystectomy  1983  . Cataract  extraction Bilateral     Dr. [name]  . Tonsillectomy    . Keratosis    . Retinal detachment surgery Left   . Cardioversion N/A 10/01/2013    Procedure: CARDIOVERSION;  Surgeon: Sanda Klein, MD;  Location: [facility] ENDOSCOPY;  Service: Cardiovascular;  Laterality: N/A;  . Tee without cardioversion N/A 10/01/2013    Procedure: TRANSESOPHAGEAL ECHOCARDIOGRAM (TEE);  Surgeon: Sanda Klein, MD;  Location: [facility] ENDOSCOPY;  Service: Cardiovascular;  Laterality: N/A;   Social History:  reports that she has never smoked. She has never used smokeless tobacco. She reports that she does not drink alcohol or use illicit drugs.  Allergies  Allergen Reactions  . Codeine     Unknown  . Cucumber Extract Nausea And Vomiting  . Diuretic [Buchu-[place]sil[place]-Ch Grass-Hydran]     Unknown  . Lipitor [Atorvastatin Calcium]     Unknown  . Penicillins Other (See Comments)    unknown  . Pravachol     Unknown    Family History  Problem Relation Age of Onset  . Arrhythmia      Long QT in first degree relatives,  . Heart attack Father   . Heart disease Father   . Heart attack Brother   . Heart disease Brother   . Pneumonia Mother      Prior to Admission medications   Medication Sig Start Date End Date Taking? Authorizing Provider  albuterol (PROVENTIL) (2.5 MG/3ML) 0.083% nebulizer solution Take 2.5 mg by nebulization every 6 (six) hours as needed for wheezing or shortness of breath.   Yes Historical Provider, MD  apixaban (ELIQUIS) 5 MG TABS tablet Take 1 tablet (5 mg total) by mouth 2 (two) times daily. 10/02/13  Yes Tarri Fuller, PA-C  diltiazem (CARDIZEM CD) 240 MG 24 hr capsule Take 1 capsule (240 mg total) by mouth daily. 10/20/13  Yes Geradine Girt, DO  furosemide (LASIX) 80 MG tablet Take 80 mg by mouth 2 (two) times daily.   Yes Historical Provider, MD  guaiFENesin-dextromethorphan (ROBITUSSIN DM) 100-10 MG/5ML syrup Take 10 mLs by mouth every 6 (six) hours as needed for cough.   Yes Historical Provider,  MD  levalbuterol (XOPENEX) 0.63 MG/3ML nebulizer solution Take 3 mLs (0.63 mg total) by nebulization every 4 (four) hours as needed for wheezing or shortness of breath. 10/20/13  Yes Geradine Girt, DO  metoprolol (LOPRESSOR) 12.5 mg TABS tablet Take 1.5 tablets (37.5 mg total) by mouth 2 (two) times daily. 10/20/13  Yes Geradine Girt, DO  potassium chloride SA (K-DUR,KLOR-CON) 20 MEQ tablet Take 1 tablet (20 mEq total) by mouth 2 (two) times daily. 10/29/13  Yes Tarri Fuller, PA-C  sertraline (ZOLOFT) 25 MG tablet Take 25 mg by mouth daily.   Yes Historical Provider, MD  vitamin E 1000 UNIT capsule Take 1,000 Units by mouth daily.   Yes Historical Provider, MD  azithromycin (ZITHROMAX) 500 MG tablet Take 500 mg by mouth daily. For 3 days. Started 10/21/13. Finsihed 10/24/13    Historical Provider, MD  OXYGEN Inhale 2 L into the lungs daily.    Historical Provider, MD  predniSONE (DELTASONE) 5 MG tablet Take 5 mg by mouth daily with breakfast. For 3 days.    Historical Provider, MD   Physical Exam: Filed Vitals:   11/09/13 1015 11/09/13 1212 11/09/13 1255 11/09/13 1538  BP: 132/80 118/72 118/72 135/76  Pulse: 126  116 116  Temp: 98.8 F (37.1 C) 99 F (37.2 C)    TempSrc: Axillary Axillary    Resp: 25  17 17   SpO2:   96% 97%    Wt Readings from Last 3 Encounters:  11/08/13 92.216 kg (203 lb 4.8 oz)  10/29/13 94.53 kg (208 lb 6.4 oz)  10/29/13 94.257 kg (207 lb 12.8 oz)    General:  Appears calm and comfortable Eyes: PERRL, normal lids, irises & conjunctiva Neck: no LAD, masses or thyromegaly Cardiovascular: tachycardic irregular, no m/r/g. Right lower extremity edema> left lower extremity. Respiratory: basilar crackles bilateral.  Abdomen: soft, ntnd Skin: right lower extremity erythematous.  Neurologic: opening eyes to commands. On BIPAP. Able to move all extremities.           Labs on Admission:  Basic Metabolic Panel:  Recent Labs Lab 11/03/13 11/09/13 0658 11/09/13 0707  NA  139 133* 132*  K 3.2* 3.6* 3.5*  CL  --  88* 90*  CO2  --  32  --   GLUCOSE  --  128* 136*  BUN 17 18 20   CREATININE 0.6 0.49* 0.60  CALCIUM  --  8.5  --    Liver Function Tests:  Recent Labs Lab 11/09/13 0658  AST 23  ALT 18  ALKPHOS 80  BILITOT 1.0  PROT 7.5  ALBUMIN 2.9*   No results found for this basename: LIPASE, AMYLASE,  in the last 168 hours No results found for this basename: AMMONIA,  in the last 168 hours CBC:  Recent Labs Lab 11/09/13 0658 11/09/13 0707  WBC 11.0*  --   NEUTROABS 8.6*  --   HGB 11.1* 12.6  HCT 33.0* 37.0  MCV 93.8  --   PLT 304  --    Cardiac Enzymes:  Recent Labs Lab 11/09/13 1503  TROPONINI <0.30    BNP (last 3 results)  Recent Labs  10/15/13 0534 10/29/13 1619 11/09/13 0658  PROBNP 2137.0* 444.0* 1801.0*   CBG:  Recent Labs Lab 11/09/13 1210  GLUCAP 177*    Radiological Exams on Admission: Dg Chest Port 1 View  11/09/2013   CLINICAL DATA:  Shortness of breath  EXAM: PORTABLE CHEST - 1 VIEW  COMPARISON:  Prior radiograph from 10/29/2013  FINDINGS: Cardiomegaly is grossly stable relative to prior exam. Mediastinal silhouette within normal limits. Atherosclerotic calcifications present within the aortic arch.  Lungs are mildly hypoinflated. Small bilateral pleural effusions are present, left larger than right. There are widespread multi focal parenchymal patchy opacities throughout both lungs, right worse than left. Diffuse interstitial thickening with pulmonary vascular congestion is present. Altogether, these findings are favored to reflect diffuse pulmonary edema, although superimposed infection is not entirely excluded. Bibasilar atelectasis is present.  No pneumothorax.  No acute osseus abnormality.  IMPRESSION: 1. Extensive multi focal irregular patchy airspace opacities throughout the bilateral lungs, right greater than left, worsened as compared to most recent radiograph from 10/29/2013. Findings are favored to  reflect moderate diffuse pulmonary edema, although superimposed infection/pneumonia could also be considered in the correct clinical setting. 2. Bilateral pleural effusions, left greater than right, with associated bibasilar atelectasis. 3. Stable cardiomegaly.   Electronically Signed   By: Jeannine Boga M.D.   On: 11/09/2013 07:00    EKG: afib RVR AT 133/MIN  Assessment/Plan Active Problems:   HTN (hypertension)   Hyperlipidemia   Atrial fibrillation   Acute diastolic heart failure   HCAP (healthcare-associated pneumonia)   Acute respiratory failure with hypoxia   Hypokalemia   Respiratory distress  ACUTE RESPIRATORY FAILURE WITH hypoxia  Secondary to acute diastolic heart failure and atrial fibrillation with RVR  Admit to step down.  Remain on BIPAP. abg later today. probnp is 1801, normal lactic acid.  Resume lasix 80 md bid IV. Daily weights and intake and output.  Resume eliquis and cardizem drip for rate control.  Serial troponins and echocardiogram done last month.    Hypertension; controlled.  earlier today her BP parameters were borderline.  Much better   Hypokalemia; replete as needed and check in am.    Abnormal CXR with irregular patchy airspace opacities; Empirically started her on IV antibiotics, if afebrile and improving, can stop the antibiotics in 48 hours.  Repeat CXR in am.   Right lower extremity cellulitis; On IV antibiotics, elevate the leg and venous dopplers ordered.       Code Status: DNR DVT Prophylaxis: Family Communication: NONE AT BEDSIDE Disposition Plan: pending. Admit to stepd own Time spent: 80 minutes.   [facility] Pager [phone]

## 2013-11-09 NOTE — Progress Notes (Signed)
Pt just came from ED on bipap with respiratory and ed nurse Brandi. Received report from ED nurse Brandi prior to pt arriving. Pt came in with respiratory distress. Pt will be on continuous bipap. Called and notified central telemetry. Pt had CHG bath. Swabbed for MRSA.

## 2013-11-09 NOTE — ED Notes (Signed)
Consulted EDP/PA about current BP. Instructed to hold. Next dose of Lasix and other gtts at this moment.

## 2013-11-09 NOTE — Progress Notes (Signed)
RT checked pt mask fit. No leak present at this time, pt looks comfortable.

## 2013-11-09 NOTE — ED Notes (Signed)
MD at bedside. [facility]

## 2013-11-09 NOTE — ED Notes (Signed)
2ND IV site attempted. Unsuccessful. Another RN to attempt

## 2013-11-10 ENCOUNTER — Inpatient Hospital Stay (HOSPITAL_COMMUNITY): Payer: Medicare Other

## 2013-11-10 DIAGNOSIS — I10 Essential (primary) hypertension: Secondary | ICD-10-CM

## 2013-11-10 LAB — GLUCOSE, CAPILLARY
GLUCOSE-CAPILLARY: 110 mg/dL — AB (ref 70–99)
GLUCOSE-CAPILLARY: 110 mg/dL — AB (ref 70–99)

## 2013-11-10 LAB — CBC
HEMATOCRIT: 29 % — AB (ref 36.0–46.0)
Hemoglobin: 9.9 g/dL — ABNORMAL LOW (ref 12.0–15.0)
MCH: 32.2 pg (ref 26.0–34.0)
MCHC: 34.1 g/dL (ref 30.0–36.0)
MCV: 94.5 fL (ref 78.0–100.0)
Platelets: 292 10*3/uL (ref 150–400)
RBC: 3.07 MIL/uL — AB (ref 3.87–5.11)
RDW: 14.5 % (ref 11.5–15.5)
WBC: 12.3 10*3/uL — AB (ref 4.0–10.5)

## 2013-11-10 LAB — LEGIONELLA ANTIGEN, URINE: Legionella Antigen, Urine: NEGATIVE

## 2013-11-10 LAB — BASIC METABOLIC PANEL
Anion gap: 11 (ref 5–15)
BUN: 16 mg/dL (ref 6–23)
CO2: 33 mEq/L — ABNORMAL HIGH (ref 19–32)
Calcium: 8 mg/dL — ABNORMAL LOW (ref 8.4–10.5)
Chloride: 91 mEq/L — ABNORMAL LOW (ref 96–112)
Creatinine, Ser: 0.49 mg/dL — ABNORMAL LOW (ref 0.50–1.10)
GFR calc Af Amer: >90 mL/min (ref >90)
GFR, EST NON AFRICAN AMERICAN: 86 mL/min — AB (ref >90)
GLUCOSE: 133 mg/dL — AB (ref 70–99)
POTASSIUM: 4.1 meq/L (ref 3.7–5.3)
Sodium: 135 mEq/L — ABNORMAL LOW (ref 137–147)

## 2013-11-10 LAB — TROPONIN I: Troponin I: <0.3 ng/mL (ref <0.30)

## 2013-11-10 LAB — PROCALCITONIN

## 2013-11-10 MED ORDER — METOPROLOL TARTRATE 1 MG/ML IV SOLN
2.5000 mg | Freq: Once | INTRAVENOUS | Status: AC
  Administered 2013-11-10: 2.5 mg via INTRAVENOUS

## 2013-11-10 MED ORDER — METOPROLOL TARTRATE 25 MG PO TABS
25.0000 mg | ORAL_TABLET | Freq: Two times a day (BID) | ORAL | Status: DC
  Administered 2013-11-10 (×2): 25 mg via ORAL
  Filled 2013-11-10 (×4): qty 1

## 2013-11-10 MED ORDER — DILTIAZEM HCL 100 MG IV SOLR
2.5000 mg/h | INTRAVENOUS | Status: DC
  Administered 2013-11-10: 10 mg/h via INTRAVENOUS
  Administered 2013-11-10: 5 mg/h via INTRAVENOUS
  Administered 2013-11-10: 10 mg/h via INTRAVENOUS
  Filled 2013-11-10 (×2): qty 100

## 2013-11-10 NOTE — Progress Notes (Signed)
Pt HR between 97-107. BP 93/49. Forrest Moron, NP notified. Orders given to change rate to 2.5mg  and hold scheduled Lasix 80mg  IV for now. Will continue to monitor.

## 2013-11-10 NOTE — Progress Notes (Signed)
TELEMETRY: Reviewed telemetry pt in Atrial fibrillation rate 110-140: Filed Vitals:   11/09/13 2300 11/10/13 0011 11/10/13 0400 11/10/13 0436  BP: 121/71 121/72 121/77 122/90  Pulse: 125 110 115 118  Temp:  98 F (36.7 C) 98 F (36.7 C)   TempSrc:  Oral Oral   Resp: 14 16 24 24   Height:      Weight:   207 lb 3.7 oz (94 kg)   SpO2: 96% 97% 94% 95%    Intake/Output Summary (Last 24 hours) at 11/10/13 0823 Last data filed at 11/10/13 0501  Gross per 24 hour  Intake    400 ml  Output   2550 ml  Net  -2150 ml   Filed Weights   11/09/13 1700 11/10/13 0400  Weight: 207 lb 3.7 oz (94 kg) 207 lb 3.7 oz (94 kg)    Subjective Still SOB. No chest pain.   Marland Kitchen apixaban  5 mg Oral BID  . ceFEPime (MAXIPIME) IV  1 g Intravenous Q12H  . furosemide  80 mg Intravenous Q12H  . metoprolol tartrate  25 mg Oral BID  . potassium chloride  40 mEq Oral BID  . sertraline  25 mg Oral Daily  . vancomycin  750 mg Intravenous Q12H   . diltiazem (CARDIZEM) infusion      LABS: Basic Metabolic Panel:  Recent Labs  11/09/13 0658 11/09/13 0707 11/10/13 0038  NA 133* 132* 135*  K 3.6* 3.5* 4.1  CL 88* 90* 91*  CO2 32  --  33*  GLUCOSE 128* 136* 133*  BUN 18 20 16   CREATININE 0.49* 0.60 0.49*  CALCIUM 8.5  --  8.0*   Liver Function Tests:  Recent Labs  11/09/13 0658  AST 23  ALT 18  ALKPHOS 80  BILITOT 1.0  PROT 7.5  ALBUMIN 2.9*   No results found for this basename: LIPASE, AMYLASE,  in the last 72 hours CBC:  Recent Labs  11/09/13 0658 11/09/13 0707 11/10/13 0038  WBC 11.0*  --  12.3*  NEUTROABS 8.6*  --   --   HGB 11.1* 12.6 9.9*  HCT 33.0* 37.0 29.0*  MCV 93.8  --  94.5  PLT 304  --  292   Cardiac Enzymes:  Recent Labs  11/09/13 1503 11/09/13 2150 11/10/13 0038  TROPONINI <0.30 <0.30 <0.30   BNP:  Recent Labs  11/09/13 0658  PROBNP 1801.0*   D-Dimer: No results found for this basename: DDIMER,  in the last 72 hours Hemoglobin A1C: No results  found for this basename: HGBA1C,  in the last 72 hours Fasting Lipid Panel: No results found for this basename: CHOL, HDL, LDLCALC, TRIG, CHOLHDL, LDLDIRECT,  in the last 72 hours Thyroid Function Tests: No results found for this basename: TSH, T4TOTAL, FREET3, T3FREE, THYROIDAB,  in the last 72 hours   Radiology/Studies:  Dg Chest Port 1 View  11/10/2013   CLINICAL DATA:  Pulmonary edema.  EXAM: PORTABLE CHEST - 1 VIEW  COMPARISON:  11/09/2013.  09/24/2013.  FINDINGS: Mediastinum and hilar structures are unremarkable. Stable cardiomegaly. Persistent diffuse pulmonary infiltrates again noted. Persistent small pleural effusions. Cardiomegaly unchanged. These findings are consistent with persistent congestive heart failure pulmonary edema. Bilateral multifocal pneumonia cannot be excluded. ARDS cannot be excluded.  IMPRESSION: Persistent cardiomegaly and unchanged bilateral pulmonary alveolar infiltrates and small pleural effusions consistent with congestive heart failure and pulmonary edema. Bilateral pneumonia and/or ARDS cannot be excluded.   Electronically Signed   By: Marcello Moores  Register   On:  11/10/2013 07:56   Dg Chest Port 1 View  11/09/2013   CLINICAL DATA:  Shortness of breath  EXAM: PORTABLE CHEST - 1 VIEW  COMPARISON:  Prior radiograph from 10/29/2013  FINDINGS: Cardiomegaly is grossly stable relative to prior exam. Mediastinal silhouette within normal limits. Atherosclerotic calcifications present within the aortic arch.  Lungs are mildly hypoinflated. Small bilateral pleural effusions are present, left larger than right. There are widespread multi focal parenchymal patchy opacities throughout both lungs, right worse than left. Diffuse interstitial thickening with pulmonary vascular congestion is present. Altogether, these findings are favored to reflect diffuse pulmonary edema, although superimposed infection is not entirely excluded. Bibasilar atelectasis is present.  No pneumothorax.  No acute  osseus abnormality.  IMPRESSION: 1. Extensive multi focal irregular patchy airspace opacities throughout the bilateral lungs, right greater than left, worsened as compared to most recent radiograph from 10/29/2013. Findings are favored to reflect moderate diffuse pulmonary edema, although superimposed infection/pneumonia could also be considered in the correct clinical setting. 2. Bilateral pleural effusions, left greater than right, with associated bibasilar atelectasis. 3. Stable cardiomegaly.   Electronically Signed   By: Jeannine Boga M.D.   On: 11/09/2013 07:00   Ecg: afib with RVR. Diffuse nonspecific ST-T changes.   Echo: 09/24/13:Study Conclusions  - Left ventricle: The cavity size was normal. Wall thickness was increased in a pattern of mild LVH. Systolic function was normal. The estimated ejection fraction was in the range of 55% to 60%. - Aortic valve: There was mild stenosis. Valve area (VTI): 1.34 cm^2. Valve area (Vmax): 1.23 cm^2. - Mitral valve: Calcified annulus. There was mild regurgitation. - Left atrium: The atrium was moderately dilated. - Atrial septum: No defect or patent foramen ovale was identified.   PHYSICAL EXAM General: Well developed, elderly WF, in no acute distress. Head: Normocephalic, atraumatic, sclera non-icteric, oropharynx is clear Neck: Negative for carotid bruits. JVD is elevated. No adenopathy Lungs: Bilateral rales. Now on nasal oxygen. Heart: IRRR, tachy,  S1 S2 with 2/6 systolic ejection murmur RUSB Abdomen: Soft, non-tender, non-distended with normoactive bowel sounds. No hepatomegaly. No rebound/guarding. No obvious abdominal masses. Msk:  Strength and tone appears normal for age. Extremities: 3+ edema.  Distal pedal pulses are 2+ and equal bilaterally. Neuro: Alert and oriented X 3. Moves all extremities spontaneously. Psych:  Responds to questions appropriately with a normal affect.  ASSESSMENT AND PLAN: 1. Atrial fibrillation with  RVR. Was on po metoprolol and cardiazem as outpatient. S/p TEE CV on 10/01/13 but this did not hold. On Eliquis for anticoagulation. IV cardizem ordered in ED but not started due to low BP. BP is good now so will start IV Cardizem and titrate. (no bolus). If she is unable to tolerate due to hypotension we may need to consider amiodarone but hopefully she will respond. Will resume metoprolol 25 mg bid.   2. Acute on chronic diastolic CHF. Exacerbated by afib with RVR. Marked volume overload. Responding well to diuresis so far. I/O negative 2150 cc. Continue IV lasix and rate control.   3. Acute respiratory failure with hypoxia. Now off Bipap. Continue aggressive diuresis. Antibiotics per primary team.   Present on Admission:  . Respiratory distress . HTN (hypertension) . Hyperlipidemia . Atrial fibrillation . Acute diastolic heart failure . HCAP (healthcare-associated pneumonia) . Acute respiratory failure with hypoxia . Hypokalemia  Signed, Peter [place], [place] 11/10/2013 8:23 AM

## 2013-11-10 NOTE — Progress Notes (Signed)
Chart reviewed. Patient known to me from previous hospitalization.   [facility] PROGRESS NOTE  Julie Haas ZDG:[id number] DOB: 1925/08/13 DOA: 11/09/2013 PCP: Estill Dooms, MD  Assessment/Plan: Principal Problem:   Acute respiratory failure with hypoxia:  Suspect mainly secondary to acute diastolic heart failure, but I cannot rule out pneumonia. Chest x-ray still shows pulmonary edema versus pneumonia, but still massively fluid overloaded. Continue antibiotics for now and check pro calcitonin.  now off BiPAP. Continue diuresis and rate control of atrial fibrillation.  Still appears fairly dyspneic. Continue step down monitoring. Active Problems:   HTN (hypertension)   Hyperlipidemia   Atrial fibrillation with rapid ventricular response:  Cardiology starting Cardizem drip. His blood pressure unable to tolerate, may require amiodarone drip.   Acute diastolic heart failure:much more fluid overloaded than at last hospitalization. Continue diuresis.   Possible HCAP (healthcare-associated pneumonia):  not much cough, and clinical picture more consistent with CHF.  check pro calcitonin and monitor clinically. Likely stop antibiotics within 24 hours if continues to improve.   Lung nodule:  needs repeat CAT scan as an outpatient.   Hypokalemia corrected   Code Status:  DNR Family Communication:   Disposition Plan:  SNF when stable  Consultants:  cardiology  Procedures:   bipap  Antibiotics: Vanc, cefepime  HPI/Subjective: Needs help eating breakfast. Denies much cough.no fevers or chills.  Objective: Filed Vitals:   11/10/13 0436  BP: 122/90  Pulse: 118  Temp:   Resp: 24    Intake/Output Summary (Last 24 hours) at 11/10/13 0825 Last data filed at 11/10/13 0501  Gross per 24 hour  Intake    400 ml  Output   2550 ml  Net  -2150 ml   Filed Weights   11/09/13 1700 11/10/13 0400  Weight: 94 kg (207 lb 3.7 oz) 94 kg (207 lb 3.7 oz)  telemetry: Atrial  fibrillation with rapid ventricular response.  Exam:   General:  Truncated sentences. Moderate respiratory distress. Off BiPAP.  Cardiovascular: irregularly irregular, fast  Respiratory: diminished throughout with rales at bases.  Abdomen: soft nontender nondistended  Ext: 3+ pitting edema.  Basic Metabolic Panel:  Recent Labs Lab 11/09/13 0658 11/09/13 0707 11/10/13 0038  NA 133* 132* 135*  K 3.6* 3.5* 4.1  CL 88* 90* 91*  CO2 32  --  33*  GLUCOSE 128* 136* 133*  BUN 18 20 16   CREATININE 0.49* 0.60 0.49*  CALCIUM 8.5  --  8.0*   Liver Function Tests:  Recent Labs Lab 11/09/13 0658  AST 23  ALT 18  ALKPHOS 80  BILITOT 1.0  PROT 7.5  ALBUMIN 2.9*   No results found for this basename: LIPASE, AMYLASE,  in the last 168 hours No results found for this basename: AMMONIA,  in the last 168 hours CBC:  Recent Labs Lab 11/09/13 0658 11/09/13 0707 11/10/13 0038  WBC 11.0*  --  12.3*  NEUTROABS 8.6*  --   --   HGB 11.1* 12.6 9.9*  HCT 33.0* 37.0 29.0*  MCV 93.8  --  94.5  PLT 304  --  292   Cardiac Enzymes:  Recent Labs Lab 11/09/13 1503 11/09/13 2150 11/10/13 0038  TROPONINI <0.30 <0.30 <0.30   BNP (last 3 results)  Recent Labs  10/15/13 0534 10/29/13 1619 11/09/13 0658  PROBNP 2137.0* 444.0* 1801.0*   CBG:  Recent Labs Lab 11/09/13 1210 11/09/13 1710 11/09/13 2140  GLUCAP 177* 181* 128*    Recent Results (from the past 240 hour(s))  MRSA PCR SCREENING     Status: None   Collection Time    11/09/13 10:14 AM      Result Value Ref Range Status   MRSA by PCR NEGATIVE  NEGATIVE Final   Comment:            The GeneXpert MRSA Assay (FDA     approved for NASAL specimens     only), is one component of a     comprehensive MRSA colonization     surveillance program. It is not     intended to diagnose MRSA     infection nor to guide or     monitor treatment for     MRSA infections.     Studies: Dg Chest Port 1 View  11/10/2013    CLINICAL DATA:  Pulmonary edema.  EXAM: PORTABLE CHEST - 1 VIEW  COMPARISON:  11/09/2013.  09/24/2013.  FINDINGS: Mediastinum and hilar structures are unremarkable. Stable cardiomegaly. Persistent diffuse pulmonary infiltrates again noted. Persistent small pleural effusions. Cardiomegaly unchanged. These findings are consistent with persistent congestive heart failure pulmonary edema. Bilateral multifocal pneumonia cannot be excluded. ARDS cannot be excluded.  IMPRESSION: Persistent cardiomegaly and unchanged bilateral pulmonary alveolar infiltrates and small pleural effusions consistent with congestive heart failure and pulmonary edema. Bilateral pneumonia and/or ARDS cannot be excluded.   Electronically Signed   By: Marcello Moores  Register   On: 11/10/2013 07:56   Dg Chest Port 1 View  11/09/2013   CLINICAL DATA:  Shortness of breath  EXAM: PORTABLE CHEST - 1 VIEW  COMPARISON:  Prior radiograph from 10/29/2013  FINDINGS: Cardiomegaly is grossly stable relative to prior exam. Mediastinal silhouette within normal limits. Atherosclerotic calcifications present within the aortic arch.  Lungs are mildly hypoinflated. Small bilateral pleural effusions are present, left larger than right. There are widespread multi focal parenchymal patchy opacities throughout both lungs, right worse than left. Diffuse interstitial thickening with pulmonary vascular congestion is present. Altogether, these findings are favored to reflect diffuse pulmonary edema, although superimposed infection is not entirely excluded. Bibasilar atelectasis is present.  No pneumothorax.  No acute osseus abnormality.  IMPRESSION: 1. Extensive multi focal irregular patchy airspace opacities throughout the bilateral lungs, right greater than left, worsened as compared to most recent radiograph from 10/29/2013. Findings are favored to reflect moderate diffuse pulmonary edema, although superimposed infection/pneumonia could also be considered in the correct  clinical setting. 2. Bilateral pleural effusions, left greater than right, with associated bibasilar atelectasis. 3. Stable cardiomegaly.   Electronically Signed   By: Jeannine Boga M.D.   On: 11/09/2013 07:00    Scheduled Meds: . apixaban  5 mg Oral BID  . ceFEPime (MAXIPIME) IV  1 g Intravenous Q12H  . furosemide  80 mg Intravenous Q12H  . metoprolol tartrate  25 mg Oral BID  . potassium chloride  40 mEq Oral BID  . sertraline  25 mg Oral Daily  . vancomycin  750 mg Intravenous Q12H   Continuous Infusions: . diltiazem (CARDIZEM) infusion      Time spent: 35 minutes  [facility] Pager [phone]. If 7PM-7AM, please contact night-coverage at [email or web address], password [facility] 11/10/2013, 8:25 AM  LOS: 1 day

## 2013-11-10 NOTE — Progress Notes (Signed)
Pt's heart rate in 120-130's. Pt's blood pressure is systolic 308/65. Requested to give pt's iv metoprolol early instead of 10 am as scheduled and Dr. Conley Canal stated ok. Will monitor.

## 2013-11-10 NOTE — Progress Notes (Signed)
Quick Note:  Pt cancelled the 9/22 ov w/ RB and 9/24 ov w/ TP for follow up PNA Upon inspection of pt's chart, she is currently admitted as of 11/09/13 ______

## 2013-11-10 NOTE — Progress Notes (Signed)
Pt HR between 110-140's. Forrest Moron, NP notified. Order given to give Metoprolol 2.5mg  IV. Will continue to monitor.

## 2013-11-11 ENCOUNTER — Ambulatory Visit: Payer: Medicare Other | Admitting: Emergency Medicine

## 2013-11-11 DIAGNOSIS — M7989 Other specified soft tissue disorders: Secondary | ICD-10-CM

## 2013-11-11 DIAGNOSIS — I359 Nonrheumatic aortic valve disorder, unspecified: Secondary | ICD-10-CM

## 2013-11-11 LAB — BASIC METABOLIC PANEL
ANION GAP: 10 (ref 5–15)
BUN: 25 mg/dL — ABNORMAL HIGH (ref 6–23)
CO2: 35 meq/L — AB (ref 19–32)
CREATININE: 0.59 mg/dL (ref 0.50–1.10)
Calcium: 7.9 mg/dL — ABNORMAL LOW (ref 8.4–10.5)
Chloride: 89 mEq/L — ABNORMAL LOW (ref 96–112)
GFR, EST NON AFRICAN AMERICAN: 81 mL/min — AB (ref >90)
Glucose, Bld: 101 mg/dL — ABNORMAL HIGH (ref 70–99)
Potassium: 3.7 mEq/L (ref 3.7–5.3)
SODIUM: 134 meq/L — AB (ref 137–147)

## 2013-11-11 MED ORDER — AMIODARONE HCL IN DEXTROSE 360-4.14 MG/200ML-% IV SOLN
30.0000 mg/h | INTRAVENOUS | Status: DC
  Administered 2013-11-11 – 2013-11-12 (×2): 30 mg/h via INTRAVENOUS
  Filled 2013-11-11 (×4): qty 200

## 2013-11-11 MED ORDER — AMIODARONE HCL IN DEXTROSE 360-4.14 MG/200ML-% IV SOLN
60.0000 mg/h | INTRAVENOUS | Status: AC
  Administered 2013-11-11 (×2): 60 mg/h via INTRAVENOUS
  Filled 2013-11-11: qty 200

## 2013-11-11 MED ORDER — METOLAZONE 2.5 MG PO TABS
2.5000 mg | ORAL_TABLET | Freq: Once | ORAL | Status: AC
  Administered 2013-11-11: 2.5 mg via ORAL
  Filled 2013-11-11: qty 1

## 2013-11-11 MED ORDER — METOPROLOL TARTRATE 12.5 MG HALF TABLET
37.5000 mg | ORAL_TABLET | Freq: Two times a day (BID) | ORAL | Status: DC
  Administered 2013-11-11 – 2013-11-19 (×16): 37.5 mg via ORAL
  Filled 2013-11-11 (×18): qty 1

## 2013-11-11 MED ORDER — AMIODARONE LOAD VIA INFUSION
150.0000 mg | Freq: Once | INTRAVENOUS | Status: AC
  Administered 2013-11-11: 150 mg via INTRAVENOUS
  Filled 2013-11-11 (×2): qty 83.34

## 2013-11-11 NOTE — Progress Notes (Signed)
TELEMETRY: Reviewed telemetry pt in Atrial fibrillation rate 110-120: Filed Vitals:   11/11/13 0300 11/11/13 0410 11/11/13 0419 11/11/13 0500  BP: 106/48 112/63 112/63 103/61  Pulse: 104 107  98  Temp:   98 F (36.7 C)   TempSrc:   Axillary   Resp: 25 22  19   Height:      Weight:      SpO2: 96% 94% 94% 97%    Intake/Output Summary (Last 24 hours) at 11/11/13 0743 Last data filed at 11/11/13 0419  Gross per 24 hour  Intake 1217.5 ml  Output   1400 ml  Net -182.5 ml   Filed Weights   11/09/13 1700 11/10/13 0400  Weight: 207 lb 3.7 oz (94 kg) 207 lb 3.7 oz (94 kg)    Subjective Still SOB. No chest pain. On Bipap now. IV cardizem reduced and lasix held last night due to hypotension.   Marland Kitchen amiodarone  150 mg Intravenous Once  . apixaban  5 mg Oral BID  . ceFEPime (MAXIPIME) IV  1 g Intravenous Q12H  . furosemide  80 mg Intravenous Q12H  . metolazone  2.5 mg Oral Once  . metoprolol tartrate  37.5 mg Oral BID  . sertraline  25 mg Oral Daily  . vancomycin  750 mg Intravenous Q12H   . amiodarone     Followed by  . amiodarone    . diltiazem (CARDIZEM) infusion 2.5 mg/hr (11/10/13 2353)    LABS: Basic Metabolic Panel:  Recent Labs  11/10/13 0038 11/11/13 0358  NA 135* 134*  K 4.1 3.7  CL 91* 89*  CO2 33* 35*  GLUCOSE 133* 101*  BUN 16 25*  CREATININE 0.49* 0.59  CALCIUM 8.0* 7.9*   Liver Function Tests:  Recent Labs  11/09/13 0658  AST 23  ALT 18  ALKPHOS 80  BILITOT 1.0  PROT 7.5  ALBUMIN 2.9*   No results found for this basename: LIPASE, AMYLASE,  in the last 72 hours CBC:  Recent Labs  11/09/13 0658 11/09/13 0707 11/10/13 0038  WBC 11.0*  --  12.3*  NEUTROABS 8.6*  --   --   HGB 11.1* 12.6 9.9*  HCT 33.0* 37.0 29.0*  MCV 93.8  --  94.5  PLT 304  --  292   Cardiac Enzymes:  Recent Labs  11/09/13 1503 11/09/13 2150 11/10/13 0038  TROPONINI <0.30 <0.30 <0.30   BNP:  Recent Labs  11/09/13 0658  PROBNP 1801.0*   D-Dimer: No  results found for this basename: DDIMER,  in the last 72 hours Hemoglobin A1C: No results found for this basename: HGBA1C,  in the last 72 hours Fasting Lipid Panel: No results found for this basename: CHOL, HDL, LDLCALC, TRIG, CHOLHDL, LDLDIRECT,  in the last 72 hours Thyroid Function Tests: No results found for this basename: TSH, T4TOTAL, FREET3, T3FREE, THYROIDAB,  in the last 72 hours   Radiology/Studies:  Dg Chest Port 1 View  11/10/2013   CLINICAL DATA:  Pulmonary edema.  EXAM: PORTABLE CHEST - 1 VIEW  COMPARISON:  11/09/2013.  09/24/2013.  FINDINGS: Mediastinum and hilar structures are unremarkable. Stable cardiomegaly. Persistent diffuse pulmonary infiltrates again noted. Persistent small pleural effusions. Cardiomegaly unchanged. These findings are consistent with persistent congestive heart failure pulmonary edema. Bilateral multifocal pneumonia cannot be excluded. ARDS cannot be excluded.  IMPRESSION: Persistent cardiomegaly and unchanged bilateral pulmonary alveolar infiltrates and small pleural effusions consistent with congestive heart failure and pulmonary edema. Bilateral pneumonia and/or ARDS cannot be excluded.  Electronically Signed   By: Marcello Moores  Register   On: 11/10/2013 07:56   Ecg: afib with RVR. Diffuse nonspecific ST-T changes.   Echo: 09/24/13:Study Conclusions  - Left ventricle: The cavity size was normal. Wall thickness was increased in a pattern of mild LVH. Systolic function was normal. The estimated ejection fraction was in the range of 55% to 60%. - Aortic valve: There was mild stenosis. Valve area (VTI): 1.34 cm^2. Valve area (Vmax): 1.23 cm^2. - Mitral valve: Calcified annulus. There was mild regurgitation. - Left atrium: The atrium was moderately dilated. - Atrial septum: No defect or patent foramen ovale was identified.   PHYSICAL EXAM General: Well developed, elderly WF, on Bipap Head: Normocephalic, atraumatic, sclera non-icteric, oropharynx is  clear Neck: Negative for carotid bruits. JVD is elevated. No adenopathy Lungs: Bilateral rales.  Heart: IRRR, tachy,  S1 S2 with 2/6 systolic ejection murmur RUSB Abdomen: Soft, non-tender, non-distended with normoactive bowel sounds. No hepatomegaly. No rebound/guarding. No obvious abdominal masses. Msk:  Strength and tone appears normal for age. Extremities: 3+ edema.  Distal pedal pulses are 2+ and equal bilaterally. Neuro: Alert and oriented X 3. Moves all extremities spontaneously. Psych:  Responds to questions appropriately with a normal affect.  ASSESSMENT AND PLAN: 1. Atrial fibrillation with RVR. Was on po metoprolol and cardiazem as outpatient. S/p TEE CV on 10/01/13 but this did not hold. On Eliquis for anticoagulation. IV cardizem ordered but limited due to hypotension. Will increase oral metoprolol to 37.5 mg bid. Will start load with IV amiodarone. (patient denies any history of allergy to corn products/corn silk). May need to consider repeat attempt at [place] after amiodarone load-? Early next week.  2. Acute on chronic diastolic CHF. Exacerbated by afib with RVR. Marked volume overload. Initial good diuretic response but this has tapered off. Will give metolazone 2.5 mg oral this am and continue IV lasix.   3. Acute respiratory failure with hypoxia. Now on Bipap. Continue aggressive diuresis. Antibiotics per primary team.   Present on Admission:  . Respiratory distress . HTN (hypertension) . Hyperlipidemia . Atrial fibrillation with rapid ventricular response . Acute diastolic heart failure . HCAP (healthcare-associated pneumonia) . Acute respiratory failure with hypoxia . Hypokalemia . Lung nodule  Signed, Taziyah Iannuzzi [place], [place] 11/11/2013 7:43 AM

## 2013-11-11 NOTE — Progress Notes (Signed)
Chart reviewed. Patient known to me from previous hospitalization.   [facility] PROGRESS NOTE  Julie Haas EBR:[id number] DOB: 1925-08-06 DOA: 11/09/2013 PCP: Estill Dooms, MD  Assessment/Plan: Principal Problem:   Acute respiratory failure with hypoxia:  Suspect mainly secondary to acute diastolic heart failure, but I cannot rule out pneumonia. Some cough with green sputum. Will continue abx for now. Required bipap overnight. Off now. Continue step down monitoring. Active Problems:   HTN (hypertension)   Hyperlipidemia   Atrial fibrillation with rapid ventricular response:  Rate still high and dropped blood pressure overnight. Now on amiodarone gtt.    Acute diastolic heart failure: 2.3 liters negative. Continue diuresis   Possible HCAP (healthcare-associated pneumonia):  See above. Continue abx for now   Lung nodule:  needs repeat CAT scan as an outpatient.   Hypokalemia corrected   Code Status:  DNR Family Communication:   Disposition Plan:  SNF when stable  Consultants:  cardiology  Procedures:   bipap  Antibiotics: Vanc, cefepime  HPI/Subjective: Cough productive of green. Rested last night. Discouraged with health issues and frequent hospitalizations.  Objective: Filed Vitals:   11/11/13 0759  BP: 99/69  Pulse:   Temp:   Resp:     Intake/Output Summary (Last 24 hours) at 11/11/13 0841 Last data filed at 11/11/13 0419  Gross per 24 hour  Intake 1217.5 ml  Output   1400 ml  Net -182.5 ml   Filed Weights   11/09/13 1700 11/10/13 0400  Weight: 94 kg (207 lb 3.7 oz) 94 kg (207 lb 3.7 oz)  telemetry: Atrial fibrillation with rapid ventricular response.  Exam:   General:  Brighter today. Breathing less labored  Cardiovascular: irregularly irregular, fast  Respiratory: diminished throughout with rales  Abdomen: soft nontender nondistended  Ext: 2+ pitting edema.  Basic Metabolic Panel:  Recent Labs Lab 11/09/13 0658  11/09/13 0707 11/10/13 0038 11/11/13 0358  NA 133* 132* 135* 134*  K 3.6* 3.5* 4.1 3.7  CL 88* 90* 91* 89*  CO2 32  --  33* 35*  GLUCOSE 128* 136* 133* 101*  BUN 18 20 16  25*  CREATININE 0.49* 0.60 0.49* 0.59  CALCIUM 8.5  --  8.0* 7.9*   Liver Function Tests:  Recent Labs Lab 11/09/13 0658  AST 23  ALT 18  ALKPHOS 80  BILITOT 1.0  PROT 7.5  ALBUMIN 2.9*   No results found for this basename: LIPASE, AMYLASE,  in the last 168 hours No results found for this basename: AMMONIA,  in the last 168 hours CBC:  Recent Labs Lab 11/09/13 0658 11/09/13 0707 11/10/13 0038  WBC 11.0*  --  12.3*  NEUTROABS 8.6*  --   --   HGB 11.1* 12.6 9.9*  HCT 33.0* 37.0 29.0*  MCV 93.8  --  94.5  PLT 304  --  292   Cardiac Enzymes:  Recent Labs Lab 11/09/13 1503 11/09/13 2150 11/10/13 0038  TROPONINI <0.30 <0.30 <0.30   BNP (last 3 results)  Recent Labs  10/15/13 0534 10/29/13 1619 11/09/13 0658  PROBNP 2137.0* 444.0* 1801.0*   CBG:  Recent Labs Lab 11/09/13 1210 11/09/13 1710 11/09/13 2140 11/10/13 0756 11/10/13 1237  GLUCAP 177* 181* 128* 110* 110*    Recent Results (from the past 240 hour(s))  MRSA PCR SCREENING     Status: None   Collection Time    11/09/13 10:14 AM      Result Value Ref Range Status   MRSA by PCR NEGATIVE  NEGATIVE  Final   Comment:            The GeneXpert MRSA Assay (FDA     approved for NASAL specimens     only), is one component of a     comprehensive MRSA colonization     surveillance program. It is not     intended to diagnose MRSA     infection nor to guide or     monitor treatment for     MRSA infections.     Studies: Dg Chest Port 1 View  11/10/2013   CLINICAL DATA:  Pulmonary edema.  EXAM: PORTABLE CHEST - 1 VIEW  COMPARISON:  11/09/2013.  09/24/2013.  FINDINGS: Mediastinum and hilar structures are unremarkable. Stable cardiomegaly. Persistent diffuse pulmonary infiltrates again noted. Persistent small pleural effusions.  Cardiomegaly unchanged. These findings are consistent with persistent congestive heart failure pulmonary edema. Bilateral multifocal pneumonia cannot be excluded. ARDS cannot be excluded.  IMPRESSION: Persistent cardiomegaly and unchanged bilateral pulmonary alveolar infiltrates and small pleural effusions consistent with congestive heart failure and pulmonary edema. Bilateral pneumonia and/or ARDS cannot be excluded.   Electronically Signed   By: Marcello Moores  Register   On: 11/10/2013 07:56    Scheduled Meds: . amiodarone  150 mg Intravenous Once  . apixaban  5 mg Oral BID  . ceFEPime (MAXIPIME) IV  1 g Intravenous Q12H  . furosemide  80 mg Intravenous Q12H  . metolazone  2.5 mg Oral Once  . metoprolol tartrate  37.5 mg Oral BID  . sertraline  25 mg Oral Daily  . vancomycin  750 mg Intravenous Q12H   Continuous Infusions: . amiodarone 60 mg/hr (11/11/13 0836)   Followed by  . amiodarone    . diltiazem (CARDIZEM) infusion 2.5 mg/hr (11/10/13 2353)    Time spent: 25 minutes  [facility] Pager [phone]. If 7PM-7AM, please contact night-coverage at [email or web address], password [facility] 11/11/2013, 8:41 AM  LOS: 2 days

## 2013-11-11 NOTE — Progress Notes (Signed)
*  PRELIMINARY RESULTS* Vascular Ultrasound Right lower extremity venous duplex has been completed.  Preliminary findings: No evidence of DVT  Landry Mellow, RDMS, RVT  11/11/2013, 9:53 AM

## 2013-11-11 NOTE — Clinical Social Work Note (Signed)
CSW spoke with Julie Haas at [facility] [phone]).  Bela reported that patient is from their SNF unit and is welcome to return upon discharge.    CSW will continue to follow.  Domenica Reamer, [place] Social Worker [phone]

## 2013-11-12 DIAGNOSIS — E876 Hypokalemia: Secondary | ICD-10-CM

## 2013-11-12 LAB — BASIC METABOLIC PANEL
ANION GAP: 11 (ref 5–15)
BUN: 19 mg/dL (ref 6–23)
CO2: 42 meq/L — AB (ref 19–32)
Calcium: 8.1 mg/dL — ABNORMAL LOW (ref 8.4–10.5)
Chloride: 82 mEq/L — ABNORMAL LOW (ref 96–112)
Creatinine, Ser: 0.63 mg/dL (ref 0.50–1.10)
GFR, EST NON AFRICAN AMERICAN: 80 mL/min — AB (ref >90)
Glucose, Bld: 88 mg/dL (ref 70–99)
Potassium: 2.7 mEq/L — CL (ref 3.7–5.3)
SODIUM: 135 meq/L — AB (ref 137–147)

## 2013-11-12 LAB — PROCALCITONIN

## 2013-11-12 LAB — MAGNESIUM: MAGNESIUM: 1.6 mg/dL (ref 1.5–2.5)

## 2013-11-12 MED ORDER — POTASSIUM CHLORIDE CRYS ER 20 MEQ PO TBCR
40.0000 meq | EXTENDED_RELEASE_TABLET | Freq: Three times a day (TID) | ORAL | Status: AC
  Administered 2013-11-12 (×3): 40 meq via ORAL
  Filled 2013-11-12 (×3): qty 2

## 2013-11-12 MED ORDER — FUROSEMIDE 10 MG/ML IJ SOLN
80.0000 mg | Freq: Two times a day (BID) | INTRAMUSCULAR | Status: DC
  Administered 2013-11-12 – 2013-11-13 (×3): 80 mg via INTRAVENOUS
  Filled 2013-11-12 (×2): qty 8

## 2013-11-12 MED ORDER — AMIODARONE HCL 200 MG PO TABS
400.0000 mg | ORAL_TABLET | Freq: Two times a day (BID) | ORAL | Status: DC
  Administered 2013-11-12 – 2013-11-22 (×21): 400 mg via ORAL
  Filled 2013-11-12 (×23): qty 2

## 2013-11-12 MED ORDER — LEVOFLOXACIN 500 MG PO TABS
500.0000 mg | ORAL_TABLET | Freq: Every day | ORAL | Status: AC
  Administered 2013-11-12 – 2013-11-14 (×3): 500 mg via ORAL
  Filled 2013-11-12 (×3): qty 1

## 2013-11-12 MED ORDER — FUROSEMIDE 10 MG/ML IJ SOLN: 40.0000 mg | Freq: Two times a day (BID) | INTRAMUSCULAR | Status: DC

## 2013-11-12 NOTE — Progress Notes (Signed)
TELEMETRY: Reviewed telemetry pt in Atrial fibrillation rate 100.  Filed Vitals:   11/11/13 2000 11/12/13 0038 11/12/13 0400 11/12/13 0432  BP: 104/57 113/66 114/52   Pulse:  85    Temp: 98.1 F (36.7 C) 97.9 F (36.6 C) 98.3 F (36.8 C)   TempSrc: Oral Axillary Oral   Resp: 20 15 24 20   Height:      Weight:   197 lb 5 oz (89.5 kg)   SpO2: 95% 96%  94%    Intake/Output Summary (Last 24 hours) at 11/12/13 0727 Last data filed at 11/12/13 0500  Gross per 24 hour  Intake  869.4 ml  Output   5175 ml  Net -4305.6 ml   Filed Weights   11/10/13 0400 11/11/13 0900 11/12/13 0400  Weight: 207 lb 3.7 oz (94 kg) 191 lb 5.8 oz (86.8 kg) 197 lb 5 oz (89.5 kg)    Subjective Dyspnea improved. No chest pain. On [place] oxygen now.   Marland Kitchen amiodarone  400 mg Oral BID  . apixaban  5 mg Oral BID  . ceFEPime (MAXIPIME) IV  1 g Intravenous Q12H  . furosemide  40 mg Intravenous Q12H  . metoprolol tartrate  37.5 mg Oral BID  . potassium chloride  40 mEq Oral TID  . sertraline  25 mg Oral Daily  . vancomycin  750 mg Intravenous Q12H      LABS: Basic Metabolic Panel:  Recent Labs  11/11/13 0358 11/12/13 0520  NA 134* 135*  K 3.7 2.7*  CL 89* 82*  CO2 35* 42*  GLUCOSE 101* 88  BUN 25* 19  CREATININE 0.59 0.63  CALCIUM 7.9* 8.1*   Liver Function Tests: No results found for this basename: AST, ALT, ALKPHOS, BILITOT, PROT, ALBUMIN,  in the last 72 hours No results found for this basename: LIPASE, AMYLASE,  in the last 72 hours CBC:  Recent Labs  11/10/13 0038  WBC 12.3*  HGB 9.9*  HCT 29.0*  MCV 94.5  PLT 292   Cardiac Enzymes:  Recent Labs  11/09/13 1503 11/09/13 2150 11/10/13 0038  TROPONINI <0.30 <0.30 <0.30   BNP: No results found for this basename: PROBNP,  in the last 72 hours D-Dimer: No results found for this basename: DDIMER,  in the last 72 hours Hemoglobin A1C: No results found for this basename: HGBA1C,  in the last 72 hours Fasting Lipid Panel: No  results found for this basename: CHOL, HDL, LDLCALC, TRIG, CHOLHDL, LDLDIRECT,  in the last 72 hours Thyroid Function Tests: No results found for this basename: TSH, T4TOTAL, FREET3, T3FREE, THYROIDAB,  in the last 72 hours   Radiology/Studies:   PORTABLE CHEST - 1 VIEW  COMPARISON: 11/09/2013. 09/24/2013.  FINDINGS: Mediastinum and hilar structures are unremarkable. Stable cardiomegaly. Persistent diffuse pulmonary infiltrates again noted. Persistent small pleural effusions. Cardiomegaly unchanged. These findings are consistent with persistent congestive heart failure pulmonary edema. Bilateral multifocal pneumonia cannot be excluded. ARDS cannot be excluded.  IMPRESSION: Persistent cardiomegaly and unchanged bilateral pulmonary alveolar infiltrates and small pleural effusions consistent with congestive heart failure and pulmonary edema. Bilateral pneumonia and/or ARDS cannot be excluded.   Electronically Signed By: Marcello Moores Register On: 11/10/2013 07:56    Ecg: afib with RVR. Diffuse nonspecific ST-T changes.   Echo: 09/24/13:Study Conclusions  - Left ventricle: The cavity size was normal. Wall thickness was increased in a pattern of mild LVH. Systolic function was normal. The estimated ejection fraction was in the range of 55% to 60%. - Aortic valve:  There was mild stenosis. Valve area (VTI): 1.34 cm^2. Valve area (Vmax): 1.23 cm^2. - Mitral valve: Calcified annulus. There was mild regurgitation. - Left atrium: The atrium was moderately dilated. - Atrial septum: No defect or patent foramen ovale was identified.   PHYSICAL EXAM General: Well developed, elderly WF, on Bipap Head: Normocephalic, atraumatic, sclera non-icteric, oropharynx is clear Neck: Negative for carotid bruits. JVD is elevated. No adenopathy Lungs: Bilateral rales- improved.  Heart: IRRR, tachy,  S1 S2 with 2/6 systolic ejection murmur RUSB Abdomen: Soft, non-tender, non-distended with normoactive  bowel sounds. No hepatomegaly. No rebound/guarding. No obvious abdominal masses. Msk:  Strength and tone appears normal for age. Extremities: 2+ edema.  Distal pedal pulses are 2+ and equal bilaterally. Neuro: Alert and oriented X 3. Moves all extremities spontaneously. Psych:  Responds to questions appropriately with a normal affect.  ASSESSMENT AND PLAN: 1. Atrial fibrillation with RVR. Was on po metoprolol and cardiazem as outpatient. S/p TEE CV on 10/01/13 but this did not hold. On Eliquis for anticoagulation. Now rate controlled on  oral metoprolol to 37.5 mg bid and IV amiodarone.Will transition amiodarone to po today.  Would consider repeat DCCV on amiodarone early next week.  2. Acute on chronic diastolic CHF. Exacerbated by afib with RVR. Still volume overload. Excellent diuretic response yesterday. I/O negative 4303 cc. (6636 cc since admission). Weight down 10 lbs since admission. Continue IV lasix. Replete potassium.  3. Acute respiratory failure with hypoxia. Now on nasal oxygen. Breathing improved.  Continue aggressive diuresis. Antibiotics per primary team.   4. Hypokalemia. Replete.   Present on Admission:  . Respiratory distress . HTN (hypertension) . Hyperlipidemia . Atrial fibrillation with rapid ventricular response . Acute diastolic heart failure . HCAP (healthcare-associated pneumonia) . Acute respiratory failure with hypoxia . Hypokalemia . Lung nodule  Signed, Pamelia Botto [place], [place] 11/12/2013 7:27 AM

## 2013-11-12 NOTE — Care Management Note (Addendum)
    Page 1 of 1   11/22/2013     2:35:16 PM CARE MANAGEMENT NOTE 11/22/2013  Patient:  Julie Haas, Julie Haas   Account Number:  [id number]  Date Initiated:  11/12/2013  Documentation initiated by:  Elissa Hefty  Subjective/Objective Assessment:   adm w resp failure     Action/Plan:   SNF   Anticipated DC Date:  11/17/2013   Anticipated DC Plan:  SKILLED NURSING FACILITY  In-house referral  Clinical Social Worker      DC Planning Services  CM consult      Choice offered to / List presented to:             Status of service:  Completed, signed off Medicare Important Message given?  YES (If response is "NO", the following Medicare IM given date fields will be blank) Date Medicare IM given:  11/19/2013 Medicare IM given by:  MAYO,HENRIETTA Date Additional Medicare IM given:  11/15/2013 Additional Medicare IM given by:  Jonnie Finner  Discharge Disposition:  [facility]  Per UR Regulation:  Reviewed for med. necessity/level of care/duration of stay  If discussed at [place] of Stay Meetings, dates discussed:   11/23/2013    Comments:  11/19/13 [place], RN, BSN, [employer] [phone] Additional IM given.  D/C to [facility] today.  11/19/13 [place], RN, BSN, [employer] [phone] Additional IM given. Continues to be volume overloaded so will continue with Lasix 80mg  IV BID.   BB increased for better rate control.  Plan for DCCV in 30 days due to LAA thrombus. CSW spoke with physician who anticipates a Monday discharge.  CSW informed [facility] concerning new discharge disposition.  11/15/2013 1240 Referral to CSW for SNF placement. Jonnie Finner RN CCM Case Mgmt phone [phone]

## 2013-11-12 NOTE — Progress Notes (Signed)
CRITICAL VALUE ALERT  Critical value received:  K 2.7 co2 42   Date of notification:  9/25  Time of notification: 7:15  Critical value read back:Yes.    Nurse who received alert:  Haide Klinker z rn  MD notified (1st page):  [place] Md   Time of first page:  0723  MD notified (2nd page):  Time of second page:  Responding MD:   Time MD responded:

## 2013-11-12 NOTE — Clinical Social Work Psychosocial (Signed)
Clinical Social Work Department BRIEF PSYCHOSOCIAL ASSESSMENT 11/12/2013  Patient:  Julie Haas, Julie Haas     Account Number:  [id number]     Admit date:  11/09/2013  Clinical Social Worker:  Domenica Reamer, [place]  Date/Time:  11/12/2013 03:39 PM  Referred by:  Physician  Date Referred:  11/12/2013 Referred for  ALF Placement   Other Referral:   Interview type:  Patient Other interview type:    PSYCHOSOCIAL DATA Living Status:  FACILITY Admitted from facility:  [facility] Level of care:  Assisted Living Primary support name:  E.T Primary support relationship to patient:  CHILD, ADULT Degree of support available:   Patient describes high level of support at her assisted living facility.    CURRENT CONCERNS Current Concerns  Post-Acute Placement   Other Concerns:    SOCIAL WORK ASSESSMENT / PLAN CSW spoke with patient about returning to [facility] at discharge.  Patient is agreeable and anxious to return to her ALF where she has friends and feels comfortable. Patient stated that she is a 3rd generation [place] girl and that her son lives in [place] while her daughter lives in [place]. Patient stated that her husband had been in the [employer], worked as a Optometrist and was an Forensic psychologist. Patient stated that she has lived a very interesting life. CSW will continue to follow.   Assessment/plan status:  Psychosocial Support/Ongoing Assessment of Needs Other assessment/ plan:   FL2 update   Information/referral to community resources:   none    PATIENT'S/FAMILY'S RESPONSE TO PLAN OF CARE: Patient is agreeable to returning to ALF.       Domenica Reamer, [place] Social Worker [phone]

## 2013-11-12 NOTE — Progress Notes (Addendum)
[facility] PROGRESS NOTE  Julie Haas WUJ:[id number] DOB: 1925/12/01 DOA: 11/09/2013 PCP: Estill Dooms, MD  Summary 80 yo female from SNF presents with respiratory failure requiring BiPAP due to acute diastolic HF, atrial fib with RVR and possible HCAP.  Low blood pressures have made cardizem gtt problematic. Now on amiodarone gtt  Assessment/Plan: Principal Problem:   Acute respiratory failure with hypoxia:  Improving. Did not require bipap overnight.  Suspect mainly secondary to acute diastolic heart failure, but cannot completely rule out pneumonia. Cough with green sputum resolving. procalcitonins normal.  Change to PO abx for a few more days.  If continues to improve, may transfer out of SDU 24-48 hours. Active Problems:   HTN (hypertension)   Hyperlipidemia   Atrial fibrillation with rapid ventricular response:  amiodarone to po per cardiology. Possible repeat DCCV on amiodarone next week. Continue metoprolol, apixaban   Acute diastolic heart failure: 6.4 liters negative. Developing contraction alkalosis. May need to slow diuretics. Defer to cardiology   Possible HCAP (healthcare-associated pneumonia):  See above. Po abx   Lung nodule:  needs repeat CAT scan as an outpatient once euvolemic   Hypokalemia replete Deconditioning: pt eval   Code Status:  DNR Family Communication:   Disposition Plan:  SNF when stable  Consultants:  cardiology  Procedures:   bipap  Antibiotics: Vanc, cefepime 9/22 - 9/25 levaquin 9/25 -   HPI/Subjective: Slept well. Breathing easier. Cough improving.   Objective: Filed Vitals:   11/12/13 0746  BP: 118/60  Pulse: 94  Temp: 97.7 F (36.5 C)  Resp: 21    Intake/Output Summary (Last 24 hours) at 11/12/13 1012 Last data filed at 11/12/13 0940  Gross per 24 hour  Intake  840.3 ml  Output   5175 ml  Net -4334.7 ml   Filed Weights   11/10/13 0400 11/11/13 0900 11/12/13 0400  Weight: 94 kg (207 lb 3.7 oz) 86.8 kg (191  lb 5.8 oz) 89.5 kg (197 lb 5 oz)  telemetry: Atrial fibrillation with rate around 115  Exam:   General:  Asleep lying flat. Arousable. Breathing nonlabored  Cardiovascular: irregularly irregular, fast  Respiratory: diminished throughout with rales  Abdomen: soft nontender nondistended  Ext: 2+ pitting edema.  Basic Metabolic Panel:  Recent Labs Lab 11/09/13 0658 11/09/13 0707 11/10/13 0038 11/11/13 0358 11/12/13 0520  NA 133* 132* 135* 134* 135*  K 3.6* 3.5* 4.1 3.7 2.7*  CL 88* 90* 91* 89* 82*  CO2 32  --  33* 35* 42*  GLUCOSE 128* 136* 133* 101* 88  BUN 18 20 16  25* 19  CREATININE 0.49* 0.60 0.49* 0.59 0.63  CALCIUM 8.5  --  8.0* 7.9* 8.1*   Liver Function Tests:  Recent Labs Lab 11/09/13 0658  AST 23  ALT 18  ALKPHOS 80  BILITOT 1.0  PROT 7.5  ALBUMIN 2.9*   No results found for this basename: LIPASE, AMYLASE,  in the last 168 hours No results found for this basename: AMMONIA,  in the last 168 hours CBC:  Recent Labs Lab 11/09/13 0658 11/09/13 0707 11/10/13 0038  WBC 11.0*  --  12.3*  NEUTROABS 8.6*  --   --   HGB 11.1* 12.6 9.9*  HCT 33.0* 37.0 29.0*  MCV 93.8  --  94.5  PLT 304  --  292   Cardiac Enzymes:  Recent Labs Lab 11/09/13 1503 11/09/13 2150 11/10/13 0038  TROPONINI <0.30 <0.30 <0.30   BNP (last 3 results)  Recent Labs  10/15/13 0534  10/29/13 1619 11/09/13 0658  PROBNP 2137.0* 444.0* 1801.0*   CBG:  Recent Labs Lab 11/09/13 1210 11/09/13 1710 11/09/13 2140 11/10/13 0756 11/10/13 1237  GLUCAP 177* 181* 128* 110* 110*    Recent Results (from the past 240 hour(s))  MRSA PCR SCREENING     Status: None   Collection Time    11/09/13 10:14 AM      Result Value Ref Range Status   MRSA by PCR NEGATIVE  NEGATIVE Final   Comment:            The GeneXpert MRSA Assay (FDA     approved for NASAL specimens     only), is one component of a     comprehensive MRSA colonization     surveillance program. It is not      intended to diagnose MRSA     infection nor to guide or     monitor treatment for     MRSA infections.     Studies: No results found.  Scheduled Meds: . amiodarone  400 mg Oral BID  . apixaban  5 mg Oral BID  . ceFEPime (MAXIPIME) IV  1 g Intravenous Q12H  . furosemide  80 mg Intravenous Q12H  . metoprolol tartrate  37.5 mg Oral BID  . potassium chloride  40 mEq Oral TID  . sertraline  25 mg Oral Daily  . vancomycin  750 mg Intravenous Q12H   Continuous Infusions:    Time spent: 25 minutes  [facility] Pager [phone]. If 7PM-7AM, please contact night-coverage at [email or web address], password [facility] 11/12/2013, 10:12 AM  LOS: 3 days

## 2013-11-13 LAB — CBC
HCT: 32.5 % — ABNORMAL LOW (ref 36.0–46.0)
Hemoglobin: 10.9 g/dL — ABNORMAL LOW (ref 12.0–15.0)
MCH: 30.7 pg (ref 26.0–34.0)
MCHC: 33.5 g/dL (ref 30.0–36.0)
MCV: 91.5 fL (ref 78.0–100.0)
Platelets: 447 10*3/uL — ABNORMAL HIGH (ref 150–400)
RBC: 3.55 MIL/uL — AB (ref 3.87–5.11)
RDW: 13.6 % (ref 11.5–15.5)
WBC: 6.6 10*3/uL (ref 4.0–10.5)

## 2013-11-13 LAB — BASIC METABOLIC PANEL
ANION GAP: 11 (ref 5–15)
BUN: 18 mg/dL (ref 6–23)
CALCIUM: 8.1 mg/dL — AB (ref 8.4–10.5)
CO2: 41 mEq/L (ref 19–32)
Chloride: 80 mEq/L — ABNORMAL LOW (ref 96–112)
Creatinine, Ser: 0.53 mg/dL (ref 0.50–1.10)
GFR calc Af Amer: >90 mL/min (ref >90)
GFR calc non Af Amer: 84 mL/min — ABNORMAL LOW (ref >90)
GLUCOSE: 84 mg/dL (ref 70–99)
POTASSIUM: 3.2 meq/L — AB (ref 3.7–5.3)
SODIUM: 132 meq/L — AB (ref 137–147)

## 2013-11-13 MED ORDER — POTASSIUM CHLORIDE CRYS ER 20 MEQ PO TBCR: 20.0000 meq | EXTENDED_RELEASE_TABLET | Freq: Two times a day (BID) | ORAL | Status: DC

## 2013-11-13 MED ORDER — POTASSIUM CHLORIDE CRYS ER 20 MEQ PO TBCR
40.0000 meq | EXTENDED_RELEASE_TABLET | Freq: Two times a day (BID) | ORAL | Status: AC
  Administered 2013-11-13 – 2013-11-14 (×2): 40 meq via ORAL
  Filled 2013-11-13 (×2): qty 2

## 2013-11-13 MED ORDER — FUROSEMIDE 40 MG PO TABS
40.0000 mg | ORAL_TABLET | Freq: Two times a day (BID) | ORAL | Status: DC
  Administered 2013-11-13 – 2013-11-17 (×8): 40 mg via ORAL
  Filled 2013-11-13 (×11): qty 1

## 2013-11-13 MED ORDER — POTASSIUM CHLORIDE CRYS ER 20 MEQ PO TBCR
40.0000 meq | EXTENDED_RELEASE_TABLET | Freq: Three times a day (TID) | ORAL | Status: DC
Start: 2013-11-13 — End: 2013-11-13
  Administered 2013-11-13: 40 meq via ORAL
  Filled 2013-11-13: qty 2

## 2013-11-13 NOTE — Progress Notes (Signed)
CRITICAL VALUE ALERT  Critical value received:  CO2 41  Date of notification:  11/13/13  Time of notification:  0448  Critical value read back: Yes  Nurse who received alert:  Carlena Sax, RN  MD notified (1st page):  Walden Field, NP  Time of first page:  [phone]  MD notified (2nd page):  Time of second page:  Responding MD:  Walden Field, NP  Time MD responded:  [phone]

## 2013-11-13 NOTE — Progress Notes (Addendum)
[facility] PROGRESS NOTE  Julie Haas PXT:[id number] DOB: 1925-10-15 DOA: 11/09/2013 PCP: Estill Dooms, MD  Assessment/Plan: Principal Problem:   Acute respiratory failure with hypoxia Active Problems:   HTN (hypertension)   Hyperlipidemia   Atrial fibrillation with rapid ventricular response   Acute diastolic heart failure   HCAP (healthcare-associated pneumonia)   Lung nodule   Hypokalemia   Respiratory distress  PCP: GREENViviann Spare, MD  Summary  81 yo female from SNF presents with respiratory failure requiring BiPAP due to acute diastolic HF, atrial fib with RVR and possible HCAP. Low blood pressures have made cardizem gtt problematic. Transitioned to amiodarone drip   Assessment/Plan:   Acute respiratory failure with hypoxia: Improving. Did not require bipap overnight.  Last chest x-ray 9/23 showed bilateral pulmonary alveolar infiltrates Suspect mainly secondary to acute diastolic heart failure, but cannot completely rule out pneumonia. Cough with green sputum resolving. procalcitonins normal. Currently on by mouth levofloxacin  If continues to improve,  Anticipated transfer out on Monday to telemetry   HTN (hypertension)   Hyperlipidemia   Atrial fibrillation Was on po metoprolol and cardiazem as outpatient. S/p TEE CV on 10/01/13 but this did not hold. On Eliquis for anticoagulation. Now rate controlled on oral metoprolol to 37.5 mg bid  amiodarone to po per cardiology. Possible repeat DCCV on amiodarone next week. Continue metoprolol, apixaban   Acute diastolic heart failure: 6.4 liters negative. Developing contraction alkalosis. May need to slow diuretics. Defer to cardiology   Possible HCAP (healthcare-associated pneumonia): See above. Po abx   Lung nodule: needs repeat CAT scan as an outpatient once euvolemic   Hypokalemia replete , check magnesium  Deconditioning: PT eval pending, do not cancel treatment because of critical value without  consulting the physician  Code Status: DNR  Family Communication:  Disposition Plan: SNF when stable   Consultants:  cardiology Procedures:  bipap Antibiotics:  Vanc, cefepime 9/22 - 9/25  levaquin 9/25 -   HPI/Subjective: Patient is comfortable, denies any chest pain or shortness of breath  Objective: Filed Vitals:   11/13/13 0418 11/13/13 0500 11/13/13 0600 11/13/13 0900  BP: 93/56 125/76 115/77 101/58  Pulse: 89  105   Temp:    97.6 F (36.4 C)  TempSrc:    Oral  Resp: 23 14 22    Height:      Weight:      SpO2:   99% 96%    Intake/Output Summary (Last 24 hours) at 11/13/13 0912 Last data filed at 11/12/13 2115  Gross per 24 hour  Intake    600 ml  Output   2650 ml  Net  -2050 ml    Exam:  General: alert & oriented x 3 In NAD  Cardiovascular: RRR, nl S1 s2  Respiratory: Decreased breath sounds at the bases, scattered rhonchi, no crackles  Abdomen: soft +BS NT/ND, no masses palpable  Extremities: No cyanosis and no edema      Data Reviewed: Basic Metabolic Panel:  Recent Labs Lab 11/09/13 0658 11/09/13 0707 11/10/13 0038 11/11/13 0358 11/12/13 0520 11/13/13 0313  NA 133* 132* 135* 134* 135* 132*  K 3.6* 3.5* 4.1 3.7 2.7* 3.2*  CL 88* 90* 91* 89* 82* 80*  CO2 32  --  33* 35* 42* 41*  GLUCOSE 128* 136* 133* 101* 88 84  BUN 18 20 16  25* 19 18  CREATININE 0.49* 0.60 0.49* 0.59 0.63 0.53  CALCIUM 8.5  --  8.0* 7.9* 8.1* 8.1*  MG  --   --   --   --  1.6  --     Liver Function Tests:  Recent Labs Lab 11/09/13 0658  AST 23  ALT 18  ALKPHOS 80  BILITOT 1.0  PROT 7.5  ALBUMIN 2.9*   No results found for this basename: LIPASE, AMYLASE,  in the last 168 hours No results found for this basename: AMMONIA,  in the last 168 hours  CBC:  Recent Labs Lab 11/09/13 0658 11/09/13 0707 11/10/13 0038 11/13/13 0313  WBC 11.0*  --  12.3* 6.6  NEUTROABS 8.6*  --   --   --   HGB 11.1* 12.6 9.9* 10.9*  HCT 33.0* 37.0 29.0* 32.5*  MCV 93.8  --   94.5 91.5  PLT 304  --  292 447*    Cardiac Enzymes:  Recent Labs Lab 11/09/13 1503 11/09/13 2150 11/10/13 0038  TROPONINI <0.30 <0.30 <0.30   BNP (last 3 results)  Recent Labs  10/15/13 0534 10/29/13 1619 11/09/13 0658  PROBNP 2137.0* 444.0* 1801.0*     CBG:  Recent Labs Lab 11/09/13 1210 11/09/13 1710 11/09/13 2140 11/10/13 0756 11/10/13 1237  GLUCAP 177* 181* 128* 110* 110*    Recent Results (from the past 240 hour(s))  MRSA PCR SCREENING     Status: None   Collection Time    11/09/13 10:14 AM      Result Value Ref Range Status   MRSA by PCR NEGATIVE  NEGATIVE Final   Comment:            The GeneXpert MRSA Assay (FDA     approved for NASAL specimens     only), is one component of a     comprehensive MRSA colonization     surveillance program. It is not     intended to diagnose MRSA     infection nor to guide or     monitor treatment for     MRSA infections.     Studies: Dg Chest 2 View  10/29/2013   CLINICAL DATA:  Followup pneumonia, chronic shortness of breath  EXAM: CHEST  2 VIEW  COMPARISON:  Prior chest x-ray 10/20/2013 ; prior CT scan of the chest 10/11/2013  FINDINGS: Slight interval worsening of patchy areas of interstitial and airspace opacity predominantly throughout the right lung with relative sparing of the left lung compared to 10/20/2013. Bilateral pleural effusions have slightly enlarged in her now associated with bibasilar atelectasis. Stable cardiomegaly. Atherosclerotic calcification present with in the transverse aorta. No acute osseous abnormality.  IMPRESSION: 1. Slight interval progression of multifocal irregular patchy interstitial and airspace opacities predominantly throughout the right lung compared to 10/20/2013. The interval change may reflect slightly increased pulmonary vascular congestion/mild edema superimposed on the underlying presumed pneumonia. Alternately, this could represent progressive pleural parenchymal scarring. A  neoplastic process is considered significantly less likely given the relatively normal appearance of the lung fields on 09/24/2013. 2. Stable cardiomegaly. 3. Slightly enlarged bilateral layering pleural effusions with associated bibasilar atelectasis.   Electronically Signed   By: Jacqulynn Cadet M.D.   On: 10/29/2013 15:23   Dg Chest 2 View  10/20/2013   CLINICAL DATA:  Pneumonia  EXAM: CHEST  2 VIEW  COMPARISON:  PA and lateral chest of October 17, 2013  FINDINGS: The lungs are better inflated today. The interstitial markings remain coarse but the fluffy alveolar infiltrates are improving. There remain small bilateral pleural effusions. The cardiopericardial silhouette remains enlarged. The central pulmonary vascularity is prominent without significant cephalization. The bony thorax exhibits no acute abnormalities.  IMPRESSION:  Improvement in the appearance of the pulmonary interstitium is consistent with ongoing resolution of clinical pneumonia. Small bilateral pleural effusions persist.   Electronically Signed   By: David  Martinique   On: 10/20/2013 07:46   Dg Chest 2 View  10/17/2013   CLINICAL DATA:  80 year old female shortness of Breath. Pneumonia. atrial fibrillation Initial encounter.  EXAM: CHEST  2 VIEW  COMPARISON:  10/15/2013 and earlier.  FINDINGS: Portable AP semi upright view at 0711 hrs. Interval larger and more confluent bilateral nodular pulmonary opacities. As before, the left base is most heavily affected. Mild veiling opacity at both lung bases suggesting small effusions. Stable cardiac size and mediastinal contours. Visualized tracheal air column is within normal limits.  IMPRESSION: Radiographic progression of bilateral pneumonia, with larger more confluent bilateral opacities. Small effusions.   Electronically Signed   By: Lars Pinks M.D.   On: 10/17/2013 07:46   Dg Chest Port 1 View  11/10/2013   CLINICAL DATA:  Pulmonary edema.  EXAM: PORTABLE CHEST - 1 VIEW  COMPARISON:   11/09/2013.  09/24/2013.  FINDINGS: Mediastinum and hilar structures are unremarkable. Stable cardiomegaly. Persistent diffuse pulmonary infiltrates again noted. Persistent small pleural effusions. Cardiomegaly unchanged. These findings are consistent with persistent congestive heart failure pulmonary edema. Bilateral multifocal pneumonia cannot be excluded. ARDS cannot be excluded.  IMPRESSION: Persistent cardiomegaly and unchanged bilateral pulmonary alveolar infiltrates and small pleural effusions consistent with congestive heart failure and pulmonary edema. Bilateral pneumonia and/or ARDS cannot be excluded.   Electronically Signed   By: Marcello Moores  Register   On: 11/10/2013 07:56   Dg Chest Port 1 View  11/09/2013   CLINICAL DATA:  Shortness of breath  EXAM: PORTABLE CHEST - 1 VIEW  COMPARISON:  Prior radiograph from 10/29/2013  FINDINGS: Cardiomegaly is grossly stable relative to prior exam. Mediastinal silhouette within normal limits. Atherosclerotic calcifications present within the aortic arch.  Lungs are mildly hypoinflated. Small bilateral pleural effusions are present, left larger than right. There are widespread multi focal parenchymal patchy opacities throughout both lungs, right worse than left. Diffuse interstitial thickening with pulmonary vascular congestion is present. Altogether, these findings are favored to reflect diffuse pulmonary edema, although superimposed infection is not entirely excluded. Bibasilar atelectasis is present.  No pneumothorax.  No acute osseus abnormality.  IMPRESSION: 1. Extensive multi focal irregular patchy airspace opacities throughout the bilateral lungs, right greater than left, worsened as compared to most recent radiograph from 10/29/2013. Findings are favored to reflect moderate diffuse pulmonary edema, although superimposed infection/pneumonia could also be considered in the correct clinical setting. 2. Bilateral pleural effusions, left greater than right, with  associated bibasilar atelectasis. 3. Stable cardiomegaly.   Electronically Signed   By: Jeannine Boga M.D.   On: 11/09/2013 07:00   Dg Chest Port 1 View  10/15/2013   CLINICAL DATA:  Increased shortness of breath and cough.  EXAM: PORTABLE CHEST - 1 VIEW  COMPARISON:  10/11/2013  FINDINGS: Cardiac enlargement. Diffuse interstitial and alveolar parenchymal infiltrates, increasing since previous study. Changes may represent diffuse pneumonia or edema. Nodular infiltrates again demonstrated in the right upper lung. Blunting of the right costophrenic angle suggesting a developing right pleural effusion.  IMPRESSION: Increasing diffuse airspace and interstitial infiltrates in the lungs. Developing small right pleural effusion.   Electronically Signed   By: Lucienne Capers M.D.   On: 10/15/2013 01:21   Dg Swallowing Func-speech Pathology  10/16/2013   Assunta Curtis, CCC-SLP     10/16/2013 12:17 PM Objective Swallowing  Evaluation: Modified Barium Swallowing Study   Patient Details  Name: Julie Haas MRN: [id number] Date of Birth: 06-11-1925  Today's Date: 10/16/2013 Time: 1145-1200 SLP Time Calculation (min): 15 min  Past Medical History:  Past Medical History  Diagnosis Date  . Hyperlipidemia   . Arrhythmia     PVC  . Retinal detachment   . Renal disorder   . Allergy   . Occlusion and stenosis of carotid artery without mention of  cerebral infarction 03/24/2012  . Pneumonia, organism unspecified 11/192013  . Depressive disorder, not elsewhere classified 03/05/2011  . Other premature beats 03/05/2011  . Other seborrheic keratosis 11/20/2010  . Syncope and collapse 10/02/2010  . Palpitations 11/21/2009  . Unspecified essential hypertension 06/27/2009  . Cardiomegaly 06/27/2009  . Other generalized ischemic cerebrovascular disease 06/27/2009  . Dizziness and giddiness 06/27/2009  . Unspecified vitamin D deficiency 05/16/2009  . Family history of sudden cardiac death (SCD) 09-Dec-2008  . Rash and other nonspecific  skin eruption 07/05/2008  . Edema 03/15/2008  . Obesity, unspecified 07/25/2006  . Varicose veins of lower extremities with inflammation 03/16/2003   . Shortness of breath    Past Surgical History:  Past Surgical History  Procedure Laterality Date  . Cholecystectomy  1983  . Cataract extraction Bilateral     Dr. [name]  . Tonsillectomy    . Keratosis    . Retinal detachment surgery Left   . Cardioversion N/A 10/01/2013    Procedure: CARDIOVERSION;  Surgeon: Sanda Klein, MD;   Location: [facility] ENDOSCOPY;  Service: Cardiovascular;  Laterality:  N/A;  . Tee without cardioversion N/A 10/01/2013    Procedure: TRANSESOPHAGEAL ECHOCARDIOGRAM (TEE);  Surgeon:  Sanda Klein, MD;  Location[facility] ENDOSCOPY;  Service:  Cardiovascular;  Laterality: N/A;   HPI:  80yo female with dx of acute respiratory failure in the setting  of bilateral R>L PNA.  Hx HTN, AFib, CHF, recent admit for PNA  (d/c 8/15). Returned 8/24 with worsening SOB, cough, chills. CXR  revealed multifocal PNA and pt admitted by [facility]. Course has been  c/b AFib with RVR and acute on chronic dCHF. Respiratory status  worse overnight requiring NRB; now back on [place].  Swallow eval  ordered to r/o aspiration.       Assessment / Plan / Recommendation Clinical Impression  Dysphagia Diagnosis: Within Functional Limits Clinical impression: Pt presents with quite functional swallow  with adequate mastication, age-related swallow delays, mild  vallecular residue of solids, and consistent/reliable laryngeal  closure with all tested consistencies.  No penetration/aspiration  observed.  Esophageal sweep revealed barium stasis of solid  materials; liquid wash facilitated clearance.  Recommend  continued regular consistency diet, thin liquids - no SLP f/u  warranted.             Diet Recommendation Regular;Thin liquid   Liquid Administration via: Cup;Straw Medication Administration: Whole meds with liquid Supervision: Patient able to self feed Compensations: Follow solids with liquid     Other  Recommendations Oral Care Recommendations: Oral care BID   Follow Up Recommendations  None           SLP Swallow Goals  n/a   General Date of Onset: 10/11/13 HPI: 80yo female with dx of acute respiratory failure in the  setting of bilateral R>L PNA.  Hx HTN, AFib, CHF, recent admit  for PNA (d/c 8/15). Returned 8/24 with worsening SOB, cough,  chills. CXR revealed multifocal PNA and pt admitted by [facility].  Course has been c/b AFib with RVR  and acute on chronic dCHF.  Respiratory status worse overnight requiring NRB; now back on [place].   Swallow eval ordered to r/o aspiration.   Type of Study: Modified Barium Swallowing Study Reason for Referral: Objectively evaluate swallowing function Previous Swallow Assessment: bedside eval 10/15/13 Diet Prior to this Study: Regular;Thin liquids Temperature Spikes Noted: No Respiratory Status: Nasal cannula History of Recent Intubation: No Behavior/Cognition: Alert;Cooperative;Pleasant mood Oral Cavity - Dentition: Adequate natural dentition Oral Motor / Sensory Function: Within functional limits Self-Feeding Abilities: Able to feed self Patient Positioning: Upright in chair Baseline Vocal Quality: Breathy;Hoarse Volitional Cough: Strong Volitional Swallow: Able to elicit Anatomy: Within functional limits Pharyngeal Secretions: Not observed secondary MBS    Reason for Referral Objectively evaluate swallowing function   Oral Phase Oral Preparation/Oral Phase Oral Phase: WFL   Pharyngeal Phase Pharyngeal Phase Pharyngeal Phase: Impaired Pharyngeal - Thin Pharyngeal - Thin Cup: Delayed swallow initiation Pharyngeal - Solids Pharyngeal - Puree: Delayed swallow initiation;Pharyngeal residue  - valleculae  Cervical Esophageal Phase    GO   Amanda L. [place], [place] CCC/SLP Pager [phone]  Cervical Esophageal Phase Cervical Esophageal Phase: Impaired Cervical Esophageal Phase - Solids Puree: Other (Comment) (barium stasis; clears with liquid wash)         [name]  10/16/2013, 12:16 PM     Scheduled Meds: . amiodarone  400 mg Oral BID  . apixaban  5 mg Oral BID  . furosemide  80 mg Intravenous Q12H  . levofloxacin  500 mg Oral Daily  . metoprolol tartrate  37.5 mg Oral BID  . potassium chloride  40 mEq Oral TID  . sertraline  25 mg Oral Daily   Continuous Infusions:   Principal Problem:   Acute respiratory failure with hypoxia Active Problems:   HTN (hypertension)   Hyperlipidemia   Atrial fibrillation with rapid ventricular response   Acute diastolic heart failure   HCAP (healthcare-associated pneumonia)   Lung nodule   Hypokalemia   Respiratory distress    Time spent: 40 minutes   [facility] Pager [phone]. If 7PM-7AM, please contact night-coverage at [email or web address], password [facility] 11/13/2013, 9:12 AM  LOS: 4 days

## 2013-11-13 NOTE — Progress Notes (Signed)
Subjective:  Remains in a fib with somewhat rapid response. Not SOB currently   Objective:  Vital Signs in the last 24 hours: BP 105/51  Pulse 103  Temp(Src) 97.6 F (36.4 C) (Oral)  Resp 22  Ht 5\' 4"  (1.626 m)  Wt 89.5 kg (197 lb 5 oz)  BMI 33.85 kg/m2  SpO2 96%  Physical Exam: Pleasant WF in NAD Lungs:  Clear Cardiac:  irregular rhythm, normal S1 and S2, no S3 Abdomen:  Soft, nontender, no masses Extremities:  No edema present  Intake/Output from previous day: 09/25 0701 - 09/26 0700 In: 600 [P.O.:600] Out: 2650 [Urine:2650]  Weight Filed Weights   11/10/13 0400 11/11/13 0900 11/12/13 0400  Weight: 94 kg (207 lb 3.7 oz) 86.8 kg (191 lb 5.8 oz) 89.5 kg (197 lb 5 oz)    Lab Results: Basic Metabolic Panel:  Recent Labs  11/12/13 0520 11/13/13 0313  NA 135* 132*  K 2.7* 3.2*  CL 82* 80*  CO2 42* 41*  GLUCOSE 88 84  BUN 19 18  CREATININE 0.63 0.53   CBC:  Recent Labs  11/13/13 0313  WBC 6.6  HGB 10.9*  HCT 32.5*  MCV 91.5  PLT 447*   Telemetry: Atrial fib with somewhat rapid response  Assessment/Plan:  1. Atrial fibrillation persistent 2. Acute on chronic diastolic CHF 3. Hypokalemia 4. Amiodarone loading  Rec:  Replete K.  Plan cardioversion on Monday. Can prob change to oral lasix.     Kerry Hough  MD [facility]  11/13/2013, 11:52 AM

## 2013-11-13 NOTE — Progress Notes (Signed)
PT Cancellation Note  Patient Details Name: Julie Haas MRN: [id number] DOB: March 26, 1925   Cancelled Treatment:    Reason Eval/Treat Not Completed: Medical issues which prohibited therapy. Pt with a noted critical CO2 value of 41. Will hold PT eval at this time and check back as time allows.    Jolyn Lent 11/13/2013, 7:52 AM  Jolyn Lent, PT, DPT Acute Rehabilitation Services Pager: [phone]

## 2013-11-14 LAB — COMPREHENSIVE METABOLIC PANEL
ALBUMIN: 2.5 g/dL — AB (ref 3.5–5.2)
ALK PHOS: 68 U/L (ref 39–117)
ALT: 24 U/L (ref 0–35)
AST: 28 U/L (ref 0–37)
Anion gap: 9 (ref 5–15)
BILIRUBIN TOTAL: 0.4 mg/dL (ref 0.3–1.2)
BUN: 20 mg/dL (ref 6–23)
CHLORIDE: 81 meq/L — AB (ref 96–112)
CO2: 44 meq/L — AB (ref 19–32)
Calcium: 8.3 mg/dL — ABNORMAL LOW (ref 8.4–10.5)
Creatinine, Ser: 0.73 mg/dL (ref 0.50–1.10)
GFR calc Af Amer: 88 mL/min — ABNORMAL LOW (ref >90)
GFR, EST NON AFRICAN AMERICAN: 76 mL/min — AB (ref >90)
Glucose, Bld: 94 mg/dL (ref 70–99)
POTASSIUM: 3.6 meq/L — AB (ref 3.7–5.3)
Sodium: 134 mEq/L — ABNORMAL LOW (ref 137–147)
Total Protein: 6.5 g/dL (ref 6.0–8.3)

## 2013-11-14 LAB — CBC
HEMATOCRIT: 32.2 % — AB (ref 36.0–46.0)
Hemoglobin: 10.5 g/dL — ABNORMAL LOW (ref 12.0–15.0)
MCH: 30.8 pg (ref 26.0–34.0)
MCHC: 32.6 g/dL (ref 30.0–36.0)
MCV: 94.4 fL (ref 78.0–100.0)
PLATELETS: 473 10*3/uL — AB (ref 150–400)
RBC: 3.41 MIL/uL — ABNORMAL LOW (ref 3.87–5.11)
RDW: 13.7 % (ref 11.5–15.5)
WBC: 6.7 10*3/uL (ref 4.0–10.5)

## 2013-11-14 NOTE — Progress Notes (Signed)
Subjective:  Remains in a fib with somewhat rapid response. Not SOB currently feeling fairly well. Not sure I believe the weight.  Objective:  Vital Signs in the last 24 hours: BP 139/70  Pulse 103  Temp(Src) 97.7 F (36.5 C) (Oral)  Resp 23  Ht 5\' 4"  (1.626 m)  Wt 97.8 kg (215 lb 9.8 oz)  BMI 36.99 kg/m2  SpO2 100%  Physical Exam: Pleasant WF in NAD Lungs:  Clear Cardiac:  irregular rhythm, normal S1 and S2, no S3 Abdomen:  Soft, nontender, no masses Extremities:  No edema present  Intake/Output from previous day: 09/26 0701 - 09/27 0700 In: 120 [P.O.:120] Out: 575 [Urine:575]  Weight Filed Weights   11/11/13 0900 11/12/13 0400 11/14/13 0430  Weight: 86.8 kg (191 lb 5.8 oz) 89.5 kg (197 lb 5 oz) 97.8 kg (215 lb 9.8 oz)    Lab Results: Basic Metabolic Panel:  Recent Labs  11/13/13 0313 11/14/13 0410  NA 132* 134*  K 3.2* 3.6*  CL 80* 81*  CO2 41* 44*  GLUCOSE 84 94  BUN 18 20  CREATININE 0.53 0.73   CBC:  Recent Labs  11/13/13 0313 11/14/13 0410  WBC 6.6 6.7  HGB 10.9* 10.5*  HCT 32.5* 32.2*  MCV 91.5 94.4  PLT 447* 473*   Telemetry: Atrial fib with somewhat rapid response  Assessment/Plan:  1. Atrial fibrillation persistent 2. Acute on chronic diastolic CHF 3. Hypokalemia 4. Amiodarone loading  Rec:  Replete K.  Plan cardioversion on Monday. Will recheck weight.  If still up may need to restart IV lasix.     Kerry Hough  MD [facility]  11/14/2013, 10:43 AM

## 2013-11-14 NOTE — Significant Event (Signed)
CRITICAL VALUE ALERT  Critical value received:  CO2 44  Date of notification:  11/14/2013  Time of notification:  8841  Critical value read back:Yes.    Nurse who received alert:  Parks Ranger   MD notified (1st page):  Walden Field NP  Time of first page: [phone]  Responding MD: Walden Field NP  Time MD responded:  [phone]

## 2013-11-14 NOTE — Evaluation (Signed)
Physical Therapy Evaluation Patient Details Name: VELENCIA LENART MRN: [id number] DOB: 03/02/1925 Today's Date: 11/14/2013   History of Present Illness    HPI: Julie Haas is a 80 y.o. female with prior h/o hypertension, PAF, with h/o diastolic heart failure, comes in for worsening sob earlier this am, brought in to ED was found to be hypoxic requiring BIPAP.  On arrival to ED, she was found to be in AFIB with RVR and she was started on cardizem drip. Her CXR revealed pulmonary edema. Currently being treated for HCAP; plan for cardioversion for afib/RVR on 9/28 per MD notes   Clinical Impression  Pt presents with moderate to severe limitations to functional mobility related to orthostatic BP (mildly symptomatic) where BP drops when pt in dependent/standing and rebounds with feet elevated/trunk reclined.   Seated/pre   100/66 Seated on BSC after transfer    84/58;     87/51 after 2 minutes Seated on recliner after transfer    83/58 Semi recumbent in recliner    103/62 Further mobility assessment deferred until BP control obtained. Plan to see patient for acute PT services as tolerated with goal of d/c to SNF side of [facility] and eventual return to Independent Living side of facility.      Follow Up Recommendations SNF;Supervision/Assistance - 24 hour    Equipment Recommendations  None recommended by PT    Recommendations for Other Services       Precautions / Restrictions Precautions Precautions: Fall Precaution Comments: orthostatic bp mildly symptomatic; subjective leg weakness      Mobility  Bed Mobility Overal bed mobility:  (not assessed, pt up in/back to chair)             General bed mobility comments: in recliner and returned to recliner   Transfers Overall transfer level: Needs assistance Equipment used: Rolling walker (2 wheeled);1 person hand held assist Transfers: Sit to/from [facility] Sit to Stand: Mod assist Stand pivot  transfers: Mod assist       General transfer comment: need verbal cues, tactile input and physical assist to optimally position self for standing, to achieve lift off from chair/bsc and to stabilize before attempting to weight shift and step around.  Use HHA/front guard to [facility], RW to chair, pt prefers RW for stability.  Ambulation/Gait             General Gait Details: did not test as BP continues to drop in sitting position  Stairs            Wheelchair Mobility    Modified Rankin (Stroke Patients Only)       Balance Overall balance assessment: Needs assistance Sitting-balance support: Feet supported;Bilateral upper extremity supported Sitting balance-Leahy Scale: Fair     Standing balance support: During functional activity Standing balance-Leahy Scale: Poor Standing balance comment: needs external support to maintain standing stability                             Pertinent Vitals/Pain Pain Assessment: No/denies pain    Home Living Family/patient expects to be discharged to:: Assisted living Living Arrangements: Non-relatives/[facility] ([facility], Independent living side)             Home Equipment: Walker - 4 wheels;Electric scooter;Grab bars - toilet;Grab bars - tub/shower Additional Comments: pt lives at [place]    Prior Function Level of Independence: Independent with assistive device(s)  Comments: Can obtain more assist from [facility] if needed.     Hand Dominance        Extremity/Trunk Assessment   Upper Extremity Assessment: Overall WFL for tasks assessed;Generalized weakness           Lower Extremity Assessment: Overall WFL for tasks assessed;Generalized weakness      Cervical / Trunk Assessment: Kyphotic  Communication   Communication: No difficulties  Cognition Arousal/Alertness: Awake/alert Behavior During Therapy: WFL for tasks assessed/performed Overall Cognitive  Status: Within Functional Limits for tasks assessed                      General Comments      Exercises        Assessment/Plan    PT Assessment Patient needs continued PT services  PT Diagnosis Generalized weakness;Difficulty walking   PT Problem List Obesity;Cardiopulmonary status limiting activity;Decreased knowledge of precautions;Decreased safety awareness;Decreased knowledge of use of DME;Decreased mobility;Decreased balance;Decreased activity tolerance;Decreased strength  PT Treatment Interventions DME instruction;Gait training;Functional mobility training;Therapeutic activities;Therapeutic exercise;Balance training;Patient/family education   PT Goals (Current goals can be found in the Care Plan section) Acute Rehab PT Goals Patient Stated Goal: return to independent living side [facility] PT Goal Formulation: With patient Time For Goal Achievement: 11/28/13 Potential to Achieve Goals: Good    Frequency Min 3X/week   Barriers to discharge        Co-evaluation               End of Session Equipment Utilized During Treatment: Oxygen (2L per [place] from wall) Activity Tolerance: Patient limited by fatigue;Treatment limited secondary to medical complications (Comment) (BP continues to drop with dependent/standing positioning) Patient left: in chair;with call bell/phone within reach Nurse Communication: Mobility status         Time: 1155-1230 PT Time Calculation (min): 35 min   Charges:   PT Evaluation $Initial PT Evaluation Tier I: 1 Procedure PT Treatments $Therapeutic Activity: 23-37 mins   PT G Codes:          Herbie Drape 11/14/2013, 1:03 PM

## 2013-11-14 NOTE — Progress Notes (Signed)
[facility] PROGRESS NOTE  VIERA OKONSKI ONG:[id number] DOB: 20-Sep-1925 DOA: 11/09/2013 PCP: Estill Dooms, MD  Assessment/Plan: Principal Problem:   Acute respiratory failure with hypoxia Active Problems:   HTN (hypertension)   Hyperlipidemia   Atrial fibrillation with rapid ventricular response   Acute diastolic heart failure   HCAP (healthcare-associated pneumonia)   Lung nodule   Hypokalemia   Respiratory distress     PCP: GREENViviann Spare, MD    Summary  80 yo female from SNF presents with respiratory failure requiring BiPAP due to acute diastolic HF, atrial fib with RVR and possible HCAP. Low blood pressures have made cardizem gtt problematic. Transitioned to amiodarone drip  Assessment/Plan:  Acute respiratory failure with hypoxia: Improving. Did not require bipap overnight.  Last chest x-ray 9/23 showed bilateral pulmonary alveolar infiltrates  Suspect mainly secondary to acute diastolic heart failure, but cannot completely rule out pneumonia. Cough with green sputum resolving. procalcitonins normal.  Currently on by mouth levofloxacin  If continues to improve,  Anticipated transfer out on Monday to telemetry   HTN (hypertension)  Hyperlipidemia   Atrial fibrillation  Was on po metoprolol and cardiazem as outpatient. S/p TEE CV on 10/01/13 but this did not hold. On Eliquis for anticoagulation. Now rate controlled on oral metoprolol to 37.5 mg bid  amiodarone to po per cardiology. Possible repeat DCCV on amiodarone tomorrow. Continue metoprolol, apixaban   Acute diastolic heart failure: 6.4 liters negative. Developing contraction alkalosis. May need to slow diuretics. Defer to cardiology   Possible HCAP (healthcare-associated pneumonia): See above. Po abx   Lung nodule: needs repeat CAT scan as an outpatient once euvolemic   Hypokalemia replete , magnesium 1.6, replete  Deconditioning: PT eval pending, do not cancel treatment because of critical value  without consulting the physician   Code Status: DNR  Family Communication:  Disposition Plan: SNF when stable , cardioversion tomorrow  Consultants:  cardiology Procedures:  bipap Antibiotics:  Vanc, cefepime 9/22 - 9/25  levaquin 9/25 -      HPI/Subjective: Remains in a fib with somewhat rapid response. Not SOB currently feeling fairly well. Not sure I believe the weight   Objective: Filed Vitals:   11/14/13 1008 11/14/13 1051 11/14/13 1117 11/14/13 1127  BP: 139/70  88/53 101/52  Pulse: 103  102   Temp:   97.8 F (36.6 C)   TempSrc:   Oral   Resp: 20  25   Height:      Weight:  87.454 kg (192 lb 12.8 oz)    SpO2: 98%  97%     Intake/Output Summary (Last 24 hours) at 11/14/13 1140 Last data filed at 11/14/13 1124  Gross per 24 hour  Intake    720 ml  Output   1025 ml  Net   -305 ml    Exam:  General: alert & oriented x 3 In NAD  Cardiovascular: Irregularly irregular, nl S1 s2  Respiratory: Decreased breath sounds at the bases, scattered rhonchi, no crackles  Abdomen: soft +BS NT/ND, no masses palpable  Extremities: No cyanosis and no edema      Data Reviewed: Basic Metabolic Panel:  Recent Labs Lab 11/10/13 0038 11/11/13 0358 11/12/13 0520 11/13/13 0313 11/14/13 0410  NA 135* 134* 135* 132* 134*  K 4.1 3.7 2.7* 3.2* 3.6*  CL 91* 89* 82* 80* 81*  CO2 33* 35* 42* 41* 44*  GLUCOSE 133* 101* 88 84 94  BUN 16 25* 19 18 20   CREATININE 0.49* 0.59 0.63  0.53 0.73  CALCIUM 8.0* 7.9* 8.1* 8.1* 8.3*  MG  --   --  1.6  --   --     Liver Function Tests:  Recent Labs Lab 11/09/13 0658 11/14/13 0410  AST 23 28  ALT 18 24  ALKPHOS 80 68  BILITOT 1.0 0.4  PROT 7.5 6.5  ALBUMIN 2.9* 2.5*   No results found for this basename: LIPASE, AMYLASE,  in the last 168 hours No results found for this basename: AMMONIA,  in the last 168 hours  CBC:  Recent Labs Lab 11/09/13 0658 11/09/13 0707 11/10/13 0038 11/13/13 0313 11/14/13 0410  WBC 11.0*   --  12.3* 6.6 6.7  NEUTROABS 8.6*  --   --   --   --   HGB 11.1* 12.6 9.9* 10.9* 10.5*  HCT 33.0* 37.0 29.0* 32.5* 32.2*  MCV 93.8  --  94.5 91.5 94.4  PLT 304  --  292 447* 473*    Cardiac Enzymes:  Recent Labs Lab 11/09/13 1503 11/09/13 2150 11/10/13 0038  TROPONINI <0.30 <0.30 <0.30   BNP (last 3 results)  Recent Labs  10/15/13 0534 10/29/13 1619 11/09/13 0658  PROBNP 2137.0* 444.0* 1801.0*     CBG:  Recent Labs Lab 11/09/13 1210 11/09/13 1710 11/09/13 2140 11/10/13 0756 11/10/13 1237  GLUCAP 177* 181* 128* 110* 110*    Recent Results (from the past 240 hour(s))  MRSA PCR SCREENING     Status: None   Collection Time    11/09/13 10:14 AM      Result Value Ref Range Status   MRSA by PCR NEGATIVE  NEGATIVE Final   Comment:            The GeneXpert MRSA Assay (FDA     approved for NASAL specimens     only), is one component of a     comprehensive MRSA colonization     surveillance program. It is not     intended to diagnose MRSA     infection nor to guide or     monitor treatment for     MRSA infections.     Studies: Dg Chest 2 View  10/29/2013   CLINICAL DATA:  Followup pneumonia, chronic shortness of breath  EXAM: CHEST  2 VIEW  COMPARISON:  Prior chest x-ray 10/20/2013 ; prior CT scan of the chest 10/11/2013  FINDINGS: Slight interval worsening of patchy areas of interstitial and airspace opacity predominantly throughout the right lung with relative sparing of the left lung compared to 10/20/2013. Bilateral pleural effusions have slightly enlarged in her now associated with bibasilar atelectasis. Stable cardiomegaly. Atherosclerotic calcification present with in the transverse aorta. No acute osseous abnormality.  IMPRESSION: 1. Slight interval progression of multifocal irregular patchy interstitial and airspace opacities predominantly throughout the right lung compared to 10/20/2013. The interval change may reflect slightly increased pulmonary vascular  congestion/mild edema superimposed on the underlying presumed pneumonia. Alternately, this could represent progressive pleural parenchymal scarring. A neoplastic process is considered significantly less likely given the relatively normal appearance of the lung fields on 09/24/2013. 2. Stable cardiomegaly. 3. Slightly enlarged bilateral layering pleural effusions with associated bibasilar atelectasis.   Electronically Signed   By: Jacqulynn Cadet M.D.   On: 10/29/2013 15:23   Dg Chest 2 View  10/20/2013   CLINICAL DATA:  Pneumonia  EXAM: CHEST  2 VIEW  COMPARISON:  PA and lateral chest of October 17, 2013  FINDINGS: The lungs are better inflated today. The interstitial markings  remain coarse but the fluffy alveolar infiltrates are improving. There remain small bilateral pleural effusions. The cardiopericardial silhouette remains enlarged. The central pulmonary vascularity is prominent without significant cephalization. The bony thorax exhibits no acute abnormalities.  IMPRESSION: Improvement in the appearance of the pulmonary interstitium is consistent with ongoing resolution of clinical pneumonia. Small bilateral pleural effusions persist.   Electronically Signed   By: David  Martinique   On: 10/20/2013 07:46   Dg Chest 2 View  10/17/2013   CLINICAL DATA:  80 year old female shortness of Breath. Pneumonia. atrial fibrillation Initial encounter.  EXAM: CHEST  2 VIEW  COMPARISON:  10/15/2013 and earlier.  FINDINGS: Portable AP semi upright view at 0711 hrs. Interval larger and more confluent bilateral nodular pulmonary opacities. As before, the left base is most heavily affected. Mild veiling opacity at both lung bases suggesting small effusions. Stable cardiac size and mediastinal contours. Visualized tracheal air column is within normal limits.  IMPRESSION: Radiographic progression of bilateral pneumonia, with larger more confluent bilateral opacities. Small effusions.   Electronically Signed   By: Lars Pinks M.D.    On: 10/17/2013 07:46   Dg Chest Port 1 View  11/10/2013   CLINICAL DATA:  Pulmonary edema.  EXAM: PORTABLE CHEST - 1 VIEW  COMPARISON:  11/09/2013.  09/24/2013.  FINDINGS: Mediastinum and hilar structures are unremarkable. Stable cardiomegaly. Persistent diffuse pulmonary infiltrates again noted. Persistent small pleural effusions. Cardiomegaly unchanged. These findings are consistent with persistent congestive heart failure pulmonary edema. Bilateral multifocal pneumonia cannot be excluded. ARDS cannot be excluded.  IMPRESSION: Persistent cardiomegaly and unchanged bilateral pulmonary alveolar infiltrates and small pleural effusions consistent with congestive heart failure and pulmonary edema. Bilateral pneumonia and/or ARDS cannot be excluded.   Electronically Signed   By: Marcello Moores  Register   On: 11/10/2013 07:56   Dg Chest Port 1 View  11/09/2013   CLINICAL DATA:  Shortness of breath  EXAM: PORTABLE CHEST - 1 VIEW  COMPARISON:  Prior radiograph from 10/29/2013  FINDINGS: Cardiomegaly is grossly stable relative to prior exam. Mediastinal silhouette within normal limits. Atherosclerotic calcifications present within the aortic arch.  Lungs are mildly hypoinflated. Small bilateral pleural effusions are present, left larger than right. There are widespread multi focal parenchymal patchy opacities throughout both lungs, right worse than left. Diffuse interstitial thickening with pulmonary vascular congestion is present. Altogether, these findings are favored to reflect diffuse pulmonary edema, although superimposed infection is not entirely excluded. Bibasilar atelectasis is present.  No pneumothorax.  No acute osseus abnormality.  IMPRESSION: 1. Extensive multi focal irregular patchy airspace opacities throughout the bilateral lungs, right greater than left, worsened as compared to most recent radiograph from 10/29/2013. Findings are favored to reflect moderate diffuse pulmonary edema, although superimposed  infection/pneumonia could also be considered in the correct clinical setting. 2. Bilateral pleural effusions, left greater than right, with associated bibasilar atelectasis. 3. Stable cardiomegaly.   Electronically Signed   By: Jeannine Boga M.D.   On: 11/09/2013 07:00   Dg Swallowing Func-speech Pathology  10/16/2013   Assunta Curtis, CCC-SLP     10/16/2013 12:17 PM Objective Swallowing Evaluation: Modified Barium Swallowing Study   Patient Details  Name: MARJORIA MANCILLAS MRN: [id number] Date of Birth: May 06, 1925  Today's Date: 10/16/2013 Time: 1145-1200 SLP Time Calculation (min): 15 min  Past Medical History:  Past Medical History  Diagnosis Date  . Hyperlipidemia   . Arrhythmia     PVC  . Retinal detachment   . Renal disorder   .  Allergy   . Occlusion and stenosis of carotid artery without mention of  cerebral infarction 03/24/2012  . Pneumonia, organism unspecified 11/192013  . Depressive disorder, not elsewhere classified 03/05/2011  . Other premature beats 03/05/2011  . Other seborrheic keratosis 11/20/2010  . Syncope and collapse 10/02/2010  . Palpitations 11/21/2009  . Unspecified essential hypertension 06/27/2009  . Cardiomegaly 06/27/2009  . Other generalized ischemic cerebrovascular disease 06/27/2009  . Dizziness and giddiness 06/27/2009  . Unspecified vitamin D deficiency 05/16/2009  . Family history of sudden cardiac death (SCD) 12-01-08  . Rash and other nonspecific skin eruption 07/05/2008  . Edema 03/15/2008  . Obesity, unspecified 07/25/2006  . Varicose veins of lower extremities with inflammation 03/16/2003   . Shortness of breath    Past Surgical History:  Past Surgical History  Procedure Laterality Date  . Cholecystectomy  1983  . Cataract extraction Bilateral     Dr. [name]  . Tonsillectomy    . Keratosis    . Retinal detachment surgery Left   . Cardioversion N/A 10/01/2013    Procedure: CARDIOVERSION;  Surgeon: Sanda Klein, MD;   Location: [facility] ENDOSCOPY;  Service: Cardiovascular;   Laterality:  N/A;  . Tee without cardioversion N/A 10/01/2013    Procedure: TRANSESOPHAGEAL ECHOCARDIOGRAM (TEE);  Surgeon:  Sanda Klein, MD;  Location[facility] ENDOSCOPY;  Service:  Cardiovascular;  Laterality: N/A;   HPI:  80yo female with dx of acute respiratory failure in the setting  of bilateral R>L PNA.  Hx HTN, AFib, CHF, recent admit for PNA  (d/c 8/15). Returned 8/24 with worsening SOB, cough, chills. CXR  revealed multifocal PNA and pt admitted by [facility]. Course has been  c/b AFib with RVR and acute on chronic dCHF. Respiratory status  worse overnight requiring NRB; now back on [place].  Swallow eval  ordered to r/o aspiration.       Assessment / Plan / Recommendation Clinical Impression  Dysphagia Diagnosis: Within Functional Limits Clinical impression: Pt presents with quite functional swallow  with adequate mastication, age-related swallow delays, mild  vallecular residue of solids, and consistent/reliable laryngeal  closure with all tested consistencies.  No penetration/aspiration  observed.  Esophageal sweep revealed barium stasis of solid  materials; liquid wash facilitated clearance.  Recommend  continued regular consistency diet, thin liquids - no SLP f/u  warranted.             Diet Recommendation Regular;Thin liquid   Liquid Administration via: Cup;Straw Medication Administration: Whole meds with liquid Supervision: Patient able to self feed Compensations: Follow solids with liquid    Other  Recommendations Oral Care Recommendations: Oral care BID   Follow Up Recommendations  None           SLP Swallow Goals  n/a   General Date of Onset: 10/11/13 HPI: 80yo female with dx of acute respiratory failure in the  setting of bilateral R>L PNA.  Hx HTN, AFib, CHF, recent admit  for PNA (d/c 8/15). Returned 8/24 with worsening SOB, cough,  chills. CXR revealed multifocal PNA and pt admitted by [facility].  Course has been c/b AFib with RVR and acute on chronic dCHF.  Respiratory status worse overnight requiring NRB;  now back on [place].   Swallow eval ordered to r/o aspiration.   Type of Study: Modified Barium Swallowing Study Reason for Referral: Objectively evaluate swallowing function Previous Swallow Assessment: bedside eval 10/15/13 Diet Prior to this Study: Regular;Thin liquids Temperature Spikes Noted: No Respiratory Status: Nasal cannula History of Recent Intubation: No Behavior/Cognition:  Alert;Cooperative;Pleasant mood Oral Cavity - Dentition: Adequate natural dentition Oral Motor / Sensory Function: Within functional limits Self-Feeding Abilities: Able to feed self Patient Positioning: Upright in chair Baseline Vocal Quality: Breathy;Hoarse Volitional Cough: Strong Volitional Swallow: Able to elicit Anatomy: Within functional limits Pharyngeal Secretions: Not observed secondary MBS    Reason for Referral Objectively evaluate swallowing function   Oral Phase Oral Preparation/Oral Phase Oral Phase: WFL   Pharyngeal Phase Pharyngeal Phase Pharyngeal Phase: Impaired Pharyngeal - Thin Pharyngeal - Thin Cup: Delayed swallow initiation Pharyngeal - Solids Pharyngeal - Puree: Delayed swallow initiation;Pharyngeal residue  - valleculae  Cervical Esophageal Phase    GO   Amanda L. Lindsay, [place] CCC/SLP Pager [phone]  Cervical Esophageal Phase Cervical Esophageal Phase: Impaired Cervical Esophageal Phase - Solids Puree: Other (Comment) (barium stasis; clears with liquid wash)         [name] 10/16/2013, 12:16 PM     Scheduled Meds: . amiodarone  400 mg Oral BID  . apixaban  5 mg Oral BID  . furosemide  40 mg Oral BID  . metoprolol tartrate  37.5 mg Oral BID  . sertraline  25 mg Oral Daily   Continuous Infusions:   Principal Problem:   Acute respiratory failure with hypoxia Active Problems:   HTN (hypertension)   Hyperlipidemia   Atrial fibrillation with rapid ventricular response   Acute diastolic heart failure   HCAP (healthcare-associated pneumonia)   Lung nodule   Hypokalemia   Respiratory  distress    Time spent: 40 minutes   [facility] Pager [phone]. If 7PM-7AM, please contact night-coverage at [email or web address], password [facility] 11/14/2013, 11:40 AM  LOS: 5 days

## 2013-11-15 ENCOUNTER — Encounter (HOSPITAL_COMMUNITY): Payer: Self-pay

## 2013-11-15 ENCOUNTER — Encounter (HOSPITAL_COMMUNITY): Payer: Medicare Other | Admitting: Anesthesiology

## 2013-11-15 ENCOUNTER — Encounter (HOSPITAL_COMMUNITY)
Admission: EM | Disposition: A | Discharge status: Skilled Nursing Facility | Payer: Self-pay | Source: Home / Self Care | Attending: Family Medicine

## 2013-11-15 ENCOUNTER — Inpatient Hospital Stay (HOSPITAL_COMMUNITY): Payer: Medicare Other | Admitting: Anesthesiology

## 2013-11-15 DIAGNOSIS — I059 Rheumatic mitral valve disease, unspecified: Secondary | ICD-10-CM

## 2013-11-15 HISTORY — PX: TEE WITHOUT CARDIOVERSION: SHX5443

## 2013-11-15 LAB — CBC
HCT: 32.2 % — ABNORMAL LOW (ref 36.0–46.0)
Hemoglobin: 10.5 g/dL — ABNORMAL LOW (ref 12.0–15.0)
MCH: 30.8 pg (ref 26.0–34.0)
MCHC: 32.6 g/dL (ref 30.0–36.0)
MCV: 94.4 fL (ref 78.0–100.0)
PLATELETS: 485 10*3/uL — AB (ref 150–400)
RBC: 3.41 MIL/uL — ABNORMAL LOW (ref 3.87–5.11)
RDW: 13.8 % (ref 11.5–15.5)
WBC: 7.6 10*3/uL (ref 4.0–10.5)

## 2013-11-15 LAB — BASIC METABOLIC PANEL
ANION GAP: 7 (ref 5–15)
BUN: 22 mg/dL (ref 6–23)
CALCIUM: 8.4 mg/dL (ref 8.4–10.5)
CO2: 39 mEq/L — ABNORMAL HIGH (ref 19–32)
Chloride: 81 mEq/L — ABNORMAL LOW (ref 96–112)
Creatinine, Ser: 0.68 mg/dL (ref 0.50–1.10)
GFR calc Af Amer: 90 mL/min — ABNORMAL LOW (ref >90)
GFR calc non Af Amer: 78 mL/min — ABNORMAL LOW (ref >90)
GLUCOSE: 106 mg/dL — AB (ref 70–99)
Potassium: 3.8 mEq/L (ref 3.7–5.3)
SODIUM: 127 meq/L — AB (ref 137–147)

## 2013-11-15 SURGERY — ECHOCARDIOGRAM, TRANSESOPHAGEAL
Anesthesia: Monitor Anesthesia Care

## 2013-11-15 MED ORDER — POTASSIUM CHLORIDE CRYS ER 20 MEQ PO TBCR
20.0000 meq | EXTENDED_RELEASE_TABLET | Freq: Every day | ORAL | Status: DC
  Administered 2013-11-15 – 2013-11-16 (×2): 20 meq via ORAL
  Filled 2013-11-15 (×3): qty 1

## 2013-11-15 MED ORDER — SODIUM CHLORIDE 0.9 % IV SOLN: INTRAVENOUS | Status: DC

## 2013-11-15 MED ORDER — BUTAMBEN-TETRACAINE-BENZOCAINE 2-2-14 % EX AERO
INHALATION_SPRAY | CUTANEOUS | Status: DC | PRN
  Administered 2013-11-15: 2 via TOPICAL

## 2013-11-15 MED ORDER — PROPOFOL 10 MG/ML IV BOLUS
INTRAVENOUS | Status: DC | PRN
  Administered 2013-11-15: 20 mg via INTRAVENOUS
  Administered 2013-11-15: 30 mg via INTRAVENOUS

## 2013-11-15 MED ORDER — PHENYLEPHRINE HCL 10 MG/ML IJ SOLN
INTRAMUSCULAR | Status: DC | PRN
  Administered 2013-11-15 (×2): 80 ug via INTRAVENOUS

## 2013-11-15 MED ORDER — LACTATED RINGERS IV SOLN
INTRAVENOUS | Status: DC
  Administered 2013-11-15: 13:00:00 via INTRAVENOUS

## 2013-11-15 MED ORDER — LIDOCAINE HCL (CARDIAC) 20 MG/ML IV SOLN
INTRAVENOUS | Status: DC | PRN
  Administered 2013-11-15: 60 mg via INTRAVENOUS

## 2013-11-15 MED ORDER — LACTATED RINGERS IV SOLN
INTRAVENOUS | Status: DC | PRN
  Administered 2013-11-15: 13:00:00 via INTRAVENOUS

## 2013-11-15 NOTE — Transfer of Care (Signed)
Immediate Anesthesia Transfer of Care Note  Patient: Julie Haas  Procedure(s) Performed: Procedure(s): TRANSESOPHAGEAL ECHOCARDIOGRAM (TEE) (N/A) CARDIOVERSION (N/A)  Patient Location: Endoscopy Unit  Anesthesia Type:MAC  Level of Consciousness: awake, alert , oriented and patient cooperative  Airway & Oxygen Therapy: Patient Spontanous Breathing and Patient connected to nasal cannula oxygen  Post-op Assessment: Report given to PACU RN, Post -op Vital signs reviewed and stable and Patient moving all extremities  Post vital signs: Reviewed and stable  Complications: No apparent anesthesia complications

## 2013-11-15 NOTE — Anesthesia Preprocedure Evaluation (Signed)
Anesthesia Evaluation  Patient identified by MRN, date of birth, ID band Patient awake    Reviewed: Allergy & Precautions, H&P , NPO status , Patient's Chart, lab work & pertinent test results, reviewed documented beta blocker date and time   Airway Mallampati: II TM Distance: >3 FB Neck ROM: Full    Dental no notable dental hx. (+) Teeth Intact, Dental Advisory Given   Pulmonary shortness of breath,  breath sounds clear to auscultation  Pulmonary exam normal       Cardiovascular hypertension, Pt. on medications and Pt. on home beta blockers + Peripheral Vascular Disease negative cardio ROS  + dysrhythmias Atrial Fibrillation Rhythm:Irregular Rate:Normal     Neuro/Psych Depression negative neurological ROS     GI/Hepatic negative GI ROS, Neg liver ROS,   Endo/Other  negative endocrine ROS  Renal/GU Renal disease  negative genitourinary   Musculoskeletal   Abdominal   Peds  Hematology negative hematology ROS (+)   Anesthesia Other Findings   Reproductive/Obstetrics negative OB ROS                           Anesthesia Physical Anesthesia Plan  ASA: III  Anesthesia Plan: MAC   Post-op Pain Management:    Induction: Intravenous  Airway Management Planned: Nasal Cannula  Additional Equipment:   Intra-op Plan:   Post-operative Plan:   Informed Consent: I have reviewed the patients History and Physical, chart, labs and discussed the procedure including the risks, benefits and alternatives for the proposed anesthesia with the patient or authorized representative who has indicated his/her understanding and acceptance.   Dental advisory given  Plan Discussed with: CRNA  Anesthesia Plan Comments:         Anesthesia Quick Evaluation

## 2013-11-15 NOTE — H&P (View-Only) (Signed)
Patient seen and personally examined and agree with note as outlined by Sharrell Ku- PA-C.  Remains in atrial fibrillation with HR in the upper 90's.  She continues to have clinical signs of CHF with rales at bases bilaterally.  There is some concern that she missed some doses of Eliquis around her admission and patient seemed confused about what meds she has been taking so will set up for TEE/DCCV instead of DCCV.  Hyponatremia may be due to CHF.  Currently on PO Lasix for now.  This was changed from IV due to contraction alkalosis.

## 2013-11-15 NOTE — Progress Notes (Signed)
CARE MANAGEMENT NOTE 11/15/2013  Patient:  Julie Haas, Julie Haas   Account Number:  [id number]  Date Initiated:  11/12/2013  Documentation initiated by:  Elissa Hefty  Subjective/Objective Assessment:   adm w resp failure     Action/Plan:   SNF   Anticipated DC Date:  11/17/2013   Anticipated DC Plan:  SKILLED NURSING FACILITY  In-house referral  Clinical Social Worker      DC Planning Services  CM consult      Choice offered to / List presented to:             Status of service:  Completed, signed off Medicare Important Message given?  YES (If response is "NO", the following Medicare IM given date fields will be blank) Date Medicare IM given:  11/12/2013 Medicare IM given by:  Elissa Hefty Date Additional Medicare IM given:  11/15/2013 Additional Medicare IM given by:  Jonnie Finner  Discharge Disposition:  [place]  Per UR Regulation:  Reviewed for med. necessity/level of care/duration of stay  If discussed at [place] of Stay Meetings, dates discussed:    Comments:  11/15/2013 1240 Referral to CSW for SNF placement. Jonnie Finner RN CCM Case Mgmt phone [phone]

## 2013-11-15 NOTE — Progress Notes (Signed)
  Echocardiogram Echocardiogram Transesophageal has been performed.  Julie Haas 11/15/2013, 2:06 PM

## 2013-11-15 NOTE — Progress Notes (Signed)
Patient seen and personally examined and agree with note as outlined by Sharrell Ku- PA-C.  Remains in atrial fibrillation with HR in the upper 90's.  She continues to have clinical signs of CHF with rales at bases bilaterally.  There is some concern that she missed some doses of Eliquis around her admission and patient seemed confused about what meds she has been taking so will set up for TEE/DCCV instead of DCCV.  Hyponatremia may be due to CHF.  Currently on PO Lasix for now.  This was changed from IV due to contraction alkalosis.

## 2013-11-15 NOTE — Progress Notes (Addendum)
Patient ID: Julie Haas, female   DOB: 06-04-1925, 80 y.o.   MRN: [id number]  [facility] PROGRESS NOTE  ZOEIE RITTER RSW:[id number] DOB: [birth year] DOA: 11/09/2013 PCP: Estill Dooms, MD  Brief narrative: 80 yo female from SNF presented with respiratory failure requiring BiPAP due to acute diastolic HF, atrial fib with RVR and possible HCAP. Low blood pressures have made cardizem gtt problematic. Transitioned to amiodarone drip.  Assessment/Plan:  Acute respiratory failure with hypoxia:  - secondary to acute on chronic diastolic CHF with ? underlying PNA - continue Lasix, weight is trending down: 197 lbs --> 192 lbs --> pending this AM (weight of 215 lbs ? Error) - cough is resolving, completed treatment with Levaquin PO - currently on O2 via [place] 2 L, maintaining oxygen saturations at target range  Atrial fibrillation  - S/P TEE CV on 10/01/13 but this did not hold.  - HR in 100's over the past 24 hours  - continue Metoprolol, Apixaban, Amiodarone 400 mg PO BID - DCCV this AM - appreciate cardiology assistance  Acute on chronic diastolic heart failure:  - weight down: 197 lbs --> 192 lbs, pending this AM - continue Lasix  - follow up on cardiology recommendations  Hyponatremia - possibly from lasix, close monitoring, follow up on weight this AM - may need to lower the dose of Lasix if Na continues trending down  - repeat BMP in AM HTN  - reasonable inpatient control  Possible HCAP  - was on Vancomycin and Cefepime, completed 4 days treatment - now on Levaquin PO (started 9/25), completed 3 days treatment  Lung nodule - needs repeat CAT scan as an outpatient once euvolemic  Hypokalemia  - from lasix, supplemented and WNL this AM Anemia of chronic disease - Hg remains stable, no signs of active bleeding  - repeat CBC in AM Deconditioning - PT evaluation completed, SNF recommended   Code Status: DNR  Family Communication: No family at bedside, pt verbalized  understanding plan  Disposition Plan: SNF when stable   DVT prophylaxis  Apixaban  IV Access:   Peripheral IV Procedures and diagnostic studies:    No results found.  Medical Consultants:   Cardiology Other Consultants:   Physical therapy  Anti-Infectives:    Vancomycin, Cefepime 9/22 - 9/25   Levaquin 9/25 - 9/28  Faye Ramsay, MD  [facility] Pager [phone]  If 7PM-7AM, please contact night-coverage [email or web address] Password [email or web address] 11/15/2013, 7:20 AM   LOS: 6 days   HPI/Subjective: No events overnight.   Objective: Filed Vitals:   11/14/13 2047 11/14/13 2206 11/14/13 2358 11/15/13 0409  BP:  109/58  114/65  Pulse: 118 108  100  Temp:   97.6 F (36.4 C) 98.5 F (36.9 C)  TempSrc:   Oral Oral  Resp: 19   21  Height:      Weight:      SpO2: 92%   95%    Intake/Output Summary (Last 24 hours) at 11/15/13 0720 Last data filed at 11/14/13 2300  Gross per 24 hour  Intake    960 ml  Output    925 ml  Net     35 ml    Exam:   General:  Pt is alert, follows commands appropriately, not in acute distress  Cardiovascular: Irregular rate and rhythm, no murmurs, no rubs, no gallops  Respiratory: Clear to auscultation bilaterally, no wheezing, diminished breath sounds at bases   Abdomen: Soft, non tender, non distended, bowel sounds present, no guarding  Extremities: No edema, pulses DP and PT palpable bilaterally   Data Reviewed: Basic Metabolic Panel:  Recent Labs Lab 11/11/13 0358 11/12/13 0520 11/13/13 0313 11/14/13 0410 11/15/13 0308  NA 134* 135* 132* 134* 127*  K 3.7 2.7* 3.2* 3.6* 3.8  CL 89* 82* 80* 81* 81*  CO2 35* 42* 41* 44* 39*  GLUCOSE 101* 88 84 94 106*  BUN 25* 19 18 20 22   CREATININE 0.59 0.63 0.53 0.73 0.68  CALCIUM 7.9* 8.1* 8.1* 8.3* 8.4  MG  --  1.6  --   --   --    Liver Function Tests:  Recent Labs Lab 11/09/13 0658 11/14/13 0410  AST 23 28  ALT 18 24  ALKPHOS 80 68  BILITOT 1.0 0.4  PROT 7.5 6.5  ALBUMIN 2.9*  2.5*   CBC:  Recent Labs Lab 11/09/13 0658 11/09/13 0707 11/10/13 0038 11/13/13 0313 11/14/13 0410 11/15/13 0308  WBC 11.0*  --  12.3* 6.6 6.7 7.6  NEUTROABS 8.6*  --   --   --   --   --   HGB 11.1* 12.6 9.9* 10.9* 10.5* 10.5*  HCT 33.0* 37.0 29.0* 32.5* 32.2* 32.2*  MCV 93.8  --  94.5 91.5 94.4 94.4  PLT 304  --  292 447* 473* 485*   Cardiac Enzymes:  Recent Labs Lab 11/09/13 1503 11/09/13 2150 11/10/13 0038  TROPONINI <0.30 <0.30 <0.30   CBG:  Recent Labs Lab 11/09/13 1210 11/09/13 1710 11/09/13 2140 11/10/13 0756 11/10/13 1237  GLUCAP 177* 181* 128* 110* 110*    Recent Results (from the past 240 hour(s))  MRSA PCR SCREENING     Status: None   Collection Time    11/09/13 10:14 AM      Result Value Ref Range Status   MRSA by PCR NEGATIVE  NEGATIVE Final   Comment:            The GeneXpert MRSA Assay (FDA     approved for NASAL specimens     only), is one component of a     comprehensive MRSA colonization     surveillance program. It is not     intended to diagnose MRSA     infection nor to guide or     monitor treatment for     MRSA infections.     Scheduled Meds: . amiodarone  400 mg Oral BID  . apixaban  5 mg Oral BID  . furosemide  40 mg Oral BID  . metoprolol tartrate  37.5 mg Oral BID  . sertraline  25 mg Oral Daily   Continuous Infusions:

## 2013-11-15 NOTE — Interval H&P Note (Signed)
History and Physical Interval Note:  11/15/2013 1:12 PM  Julie Haas  has presented today for surgery, with the diagnosis of afib  The various methods of treatment have been discussed with the patient and family. After consideration of risks, benefits and other options for treatment, the patient has consented to  Procedure(s): TRANSESOPHAGEAL ECHOCARDIOGRAM (TEE) (N/A) CARDIOVERSION (N/A) as a surgical intervention .  The patient's history has been reviewed, patient examined, no change in status, stable for surgery.  I have reviewed the patient's chart and labs.  Questions were answered to the patient's satisfaction.     Dorris Carnes

## 2013-11-15 NOTE — Progress Notes (Signed)
Patient: Julie Haas / Admit Date: 11/09/2013 / Date of Encounter: 11/15/2013, 11:07 AM   Subjective: Feeling better, less SOB. RLE edema persists.  Objective: Telemetry: AF rates 90s-100s Physical Exam: Blood pressure 110/72, pulse 101, temperature 98.1 F (36.7 C), temperature source Oral, resp. rate 22, height 5\' 4"  (1.626 m), weight 192 lb 12.8 oz (87.454 kg), SpO2 95.00%. General: Well developed, well nourished WF in no acute distress. Head: Normocephalic, atraumatic, sclera non-icteric, no xanthomas, nares are without discharge. Neck: JVP not elevated. Lungs: Bilateral crackles without wheezes, rhonchi. Breathing is unlabored. Heart: Irregularly irregular S1 S2 without murmurs, rubs, or gallops.  Abdomen: Soft, non-tender, non-distended with normoactive bowel sounds. No rebound/guarding. Extremities: No clubbing or cyanosis. 1+ RLE pitting edema. Distal pedal pulses are 2+ and equal bilaterally. Neuro: Alert and oriented X 3. Moves all extremities spontaneously. Psych:  Responds to questions appropriately with a normal affect.   Intake/Output Summary (Last 24 hours) at 11/15/13 1107 Last data filed at 11/15/13 0900  Gross per 24 hour  Intake    360 ml  Output    825 ml  Net   -465 ml    Inpatient Medications:  . amiodarone  400 mg Oral BID  . apixaban  5 mg Oral BID  . furosemide  40 mg Oral BID  . metoprolol tartrate  37.5 mg Oral BID  . sertraline  25 mg Oral Daily   Infusions:    Labs:  Recent Labs  11/14/13 0410 11/15/13 0308  NA 134* 127*  K 3.6* 3.8  CL 81* 81*  CO2 44* 39*  GLUCOSE 94 106*  BUN 20 22  CREATININE 0.73 0.68  CALCIUM 8.3* 8.4    Recent Labs  11/14/13 0410  AST 28  ALT 24  ALKPHOS 68  BILITOT 0.4  PROT 6.5  ALBUMIN 2.5*    Recent Labs  11/14/13 0410 11/15/13 0308  WBC 6.7 7.6  HGB 10.5* 10.5*  HCT 32.2* 32.2*  MCV 94.4 94.4  PLT 473* 485*   Radiology/Studies:  Dg Chest 2 View  10/29/2013   CLINICAL DATA:   Followup pneumonia, chronic shortness of breath  EXAM: CHEST  2 VIEW  COMPARISON:  Prior chest x-ray 10/20/2013 ; prior CT scan of the chest 10/11/2013  FINDINGS: Slight interval worsening of patchy areas of interstitial and airspace opacity predominantly throughout the right lung with relative sparing of the left lung compared to 10/20/2013. Bilateral pleural effusions have slightly enlarged in her now associated with bibasilar atelectasis. Stable cardiomegaly. Atherosclerotic calcification present with in the transverse aorta. No acute osseous abnormality.  IMPRESSION: 1. Slight interval progression of multifocal irregular patchy interstitial and airspace opacities predominantly throughout the right lung compared to 10/20/2013. The interval change may reflect slightly increased pulmonary vascular congestion/mild edema superimposed on the underlying presumed pneumonia. Alternately, this could represent progressive pleural parenchymal scarring. A neoplastic process is considered significantly less likely given the relatively normal appearance of the lung fields on 09/24/2013. 2. Stable cardiomegaly. 3. Slightly enlarged bilateral layering pleural effusions with associated bibasilar atelectasis.   Electronically Signed   By: Jacqulynn Cadet M.D.   On: 10/29/2013 15:23   Dg Chest 2 View  10/20/2013   CLINICAL DATA:  Pneumonia  EXAM: CHEST  2 VIEW  COMPARISON:  PA and lateral chest of October 17, 2013  FINDINGS: The lungs are better inflated today. The interstitial markings remain coarse but the fluffy alveolar infiltrates are improving. There remain small bilateral pleural effusions. The cardiopericardial silhouette remains  enlarged. The central pulmonary vascularity is prominent without significant cephalization. The bony thorax exhibits no acute abnormalities.  IMPRESSION: Improvement in the appearance of the pulmonary interstitium is consistent with ongoing resolution of clinical pneumonia. Small bilateral  pleural effusions persist.   Electronically Signed   By: David  Martinique   On: 10/20/2013 07:46   Dg Chest 2 View  10/17/2013   CLINICAL DATA:  80 year old female shortness of Breath. Pneumonia. atrial fibrillation Initial encounter.  EXAM: CHEST  2 VIEW  COMPARISON:  10/15/2013 and earlier.  FINDINGS: Portable AP semi upright view at 0711 hrs. Interval larger and more confluent bilateral nodular pulmonary opacities. As before, the left base is most heavily affected. Mild veiling opacity at both lung bases suggesting small effusions. Stable cardiac size and mediastinal contours. Visualized tracheal air column is within normal limits.  IMPRESSION: Radiographic progression of bilateral pneumonia, with larger more confluent bilateral opacities. Small effusions.   Electronically Signed   By: Lars Pinks M.D.   On: 10/17/2013 07:46   Dg Chest Port 1 View  11/10/2013   CLINICAL DATA:  Pulmonary edema.  EXAM: PORTABLE CHEST - 1 VIEW  COMPARISON:  11/09/2013.  09/24/2013.  FINDINGS: Mediastinum and hilar structures are unremarkable. Stable cardiomegaly. Persistent diffuse pulmonary infiltrates again noted. Persistent small pleural effusions. Cardiomegaly unchanged. These findings are consistent with persistent congestive heart failure pulmonary edema. Bilateral multifocal pneumonia cannot be excluded. ARDS cannot be excluded.  IMPRESSION: Persistent cardiomegaly and unchanged bilateral pulmonary alveolar infiltrates and small pleural effusions consistent with congestive heart failure and pulmonary edema. Bilateral pneumonia and/or ARDS cannot be excluded.   Electronically Signed   By: Marcello Moores  Register   On: 11/10/2013 07:56   Dg Chest Port 1 View  11/09/2013   CLINICAL DATA:  Shortness of breath  EXAM: PORTABLE CHEST - 1 VIEW  COMPARISON:  Prior radiograph from 10/29/2013  FINDINGS: Cardiomegaly is grossly stable relative to prior exam. Mediastinal silhouette within normal limits. Atherosclerotic calcifications present  within the aortic arch.  Lungs are mildly hypoinflated. Small bilateral pleural effusions are present, left larger than right. There are widespread multi focal parenchymal patchy opacities throughout both lungs, right worse than left. Diffuse interstitial thickening with pulmonary vascular congestion is present. Altogether, these findings are favored to reflect diffuse pulmonary edema, although superimposed infection is not entirely excluded. Bibasilar atelectasis is present.  No pneumothorax.  No acute osseus abnormality.  IMPRESSION: 1. Extensive multi focal irregular patchy airspace opacities throughout the bilateral lungs, right greater than left, worsened as compared to most recent radiograph from 10/29/2013. Findings are favored to reflect moderate diffuse pulmonary edema, although superimposed infection/pneumonia could also be considered in the correct clinical setting. 2. Bilateral pleural effusions, left greater than right, with associated bibasilar atelectasis. 3. Stable cardiomegaly.   Electronically Signed   By: Jeannine Boga M.D.   On: 11/09/2013 07:00   Dg Swallowing Func-speech Pathology  10/16/2013   Assunta Curtis, CCC-SLP     10/16/2013 12:17 PM Objective Swallowing Evaluation: Modified Barium Swallowing Study   Patient Details  Name: ASIAH BROWDER MRN: [id number] Date of Birth: 1926/01/12  Today's Date: 10/16/2013 Time: 1145-1200 SLP Time Calculation (min): 15 min  Past Medical History:  Past Medical History  Diagnosis Date  . Hyperlipidemia   . Arrhythmia     PVC  . Retinal detachment   . Renal disorder   . Allergy   . Occlusion and stenosis of carotid artery without mention of  cerebral infarction 03/24/2012  .  Pneumonia, organism unspecified 11/192013  . Depressive disorder, not elsewhere classified 03/05/2011  . Other premature beats 03/05/2011  . Other seborrheic keratosis 11/20/2010  . Syncope and collapse 10/02/2010  . Palpitations 11/21/2009  . Unspecified essential hypertension  06/27/2009  . Cardiomegaly 06/27/2009  . Other generalized ischemic cerebrovascular disease 06/27/2009  . Dizziness and giddiness 06/27/2009  . Unspecified vitamin D deficiency 05/16/2009  . Family history of sudden cardiac death (SCD) 2008-12-15  . Rash and other nonspecific skin eruption 07/05/2008  . Edema 03/15/2008  . Obesity, unspecified 07/25/2006  . Varicose veins of lower extremities with inflammation 03/16/2003   . Shortness of breath    Past Surgical History:  Past Surgical History  Procedure Laterality Date  . Cholecystectomy  1983  . Cataract extraction Bilateral     Dr. [name]  . Tonsillectomy    . Keratosis    . Retinal detachment surgery Left   . Cardioversion N/A 10/01/2013    Procedure: CARDIOVERSION;  Surgeon: Sanda Klein, MD;   Location: [facility] ENDOSCOPY;  Service: Cardiovascular;  Laterality:  N/A;  . Tee without cardioversion N/A 10/01/2013    Procedure: TRANSESOPHAGEAL ECHOCARDIOGRAM (TEE);  Surgeon:  Sanda Klein, MD;  Location: [facility] ENDOSCOPY;  Service:  Cardiovascular;  Laterality: N/A;   HPI:  80yo female with dx of acute respiratory failure in the setting  of bilateral R>L PNA.  Hx HTN, AFib, CHF, recent admit for PNA  (d/c 8/15). Returned 8/24 with worsening SOB, cough, chills. CXR  revealed multifocal PNA and pt admitted by [facility]. Course has been  c/b AFib with RVR and acute on chronic dCHF. Respiratory status  worse overnight requiring NRB; now back on [place].  Swallow eval  ordered to r/o aspiration.       Assessment / Plan / Recommendation Clinical Impression  Dysphagia Diagnosis: Within Functional Limits Clinical impression: Pt presents with quite functional swallow  with adequate mastication, age-related swallow delays, mild  vallecular residue of solids, and consistent/reliable laryngeal  closure with all tested consistencies.  No penetration/aspiration  observed.  Esophageal sweep revealed barium stasis of solid  materials; liquid wash facilitated clearance.  Recommend  continued regular  consistency diet, thin liquids - no SLP f/u  warranted.             Diet Recommendation Regular;Thin liquid   Liquid Administration via: Cup;Straw Medication Administration: Whole meds with liquid Supervision: Patient able to self feed Compensations: Follow solids with liquid    Other  Recommendations Oral Care Recommendations: Oral care BID   Follow Up Recommendations  None           SLP Swallow Goals  n/a   General Date of Onset: 10/11/13 HPI: 80yo female with dx of acute respiratory failure in the  setting of bilateral R>L PNA.  Hx HTN, AFib, CHF, recent admit  for PNA (d/c 8/15). Returned 8/24 with worsening SOB, cough,  chills. CXR revealed multifocal PNA and pt admitted by [facility].  Course has been c/b AFib with RVR and acute on chronic dCHF.  Respiratory status worse overnight requiring NRB; now back on [place].   Swallow eval ordered to r/o aspiration.   Type of Study: Modified Barium Swallowing Study Reason for Referral: Objectively evaluate swallowing function Previous Swallow Assessment: bedside eval 10/15/13 Diet Prior to this Study: Regular;Thin liquids Temperature Spikes Noted: No Respiratory Status: Nasal cannula History of Recent Intubation: No Behavior/Cognition: Alert;Cooperative;Pleasant mood Oral Cavity - Dentition: Adequate natural dentition Oral Motor / Sensory Function: Within functional limits Self-Feeding Abilities:  Able to feed self Patient Positioning: Upright in chair Baseline Vocal Quality: Breathy;Hoarse Volitional Cough: Strong Volitional Swallow: Able to elicit Anatomy: Within functional limits Pharyngeal Secretions: Not observed secondary MBS    Reason for Referral Objectively evaluate swallowing function   Oral Phase Oral Preparation/Oral Phase Oral Phase: WFL   Pharyngeal Phase Pharyngeal Phase Pharyngeal Phase: Impaired Pharyngeal - Thin Pharyngeal - Thin Cup: Delayed swallow initiation Pharyngeal - Solids Pharyngeal - Puree: Delayed swallow initiation;Pharyngeal residue  - valleculae   Cervical Esophageal Phase    GO   Amanda L. [place], [place] CCC/SLP Pager [phone]  Cervical Esophageal Phase Cervical Esophageal Phase: Impaired Cervical Esophageal Phase - Solids Puree: Other (Comment) (barium stasis; clears with liquid wash)         [name] 10/16/2013, 12:16 PM      Assessment and Plan  1. Acute respiratory failure likely due to CHF and possible HCAP, tx with abx 2. Paroxysmal now persistent atrial fibrillation s/p recent TEE/DCCV 10/01/13 - now on amiodarone since DCCV did not hold - she missed Eliquis dose day of admission due to somnolence/NPO while on bipap - she also states she has a bottle of medicine at home that she did not recognize the prescribing physician so did not take it - have asked nursing to try to reach son to clarify. Regardless, we do not feel comfortable proceeding with DCCV without TEE in this setting thus plan TEE/DCCV today - consider rehab at DC given questionable med compliance 3. Acute on chronic diastolic CHF  -weight 818->[id number] (was 203 in the office recently), -9L thus far - not clear that weights or I/O's totally accurate  - continue oral Lasix and add daily KCl 4. RLE cellulitis - LE duplex negative for DVT 5. HTN, controlled 6. Hyponatremia ? d/t diuresis and/or lung process 7. Anemia/thrombocytosis without evidence of bleeding  Signed, Melina Copa PA-C

## 2013-11-15 NOTE — Clinical Social Work Note (Signed)
CSW spoke with [facility]- patient is ok to discharge to SNF side of the facility.  CSW went to speak with the patient about this plan but patient had found out that after noon she had a clot in her heart and she stated she was not feeling up to talking today.  CSW will continue to follow.  Domenica Reamer, [place] Social Worker [phone]

## 2013-11-15 NOTE — Op Note (Signed)
LA appendage very large with multiple small sacs at tip.  Smoke would not completely clear consistent with organizing thrombus.   Full report to follow in EPIC.

## 2013-11-15 NOTE — H&P (View-Only) (Signed)
Patient: Julie Haas / Admit Date: 11/09/2013 / Date of Encounter: 11/15/2013, 11:07 AM   Subjective: Feeling better, less SOB. RLE edema persists.  Objective: Telemetry: AF rates 90s-100s Physical Exam: Blood pressure 110/72, pulse 101, temperature 98.1 F (36.7 C), temperature source Oral, resp. rate 22, height 5\' 4"  (1.626 m), weight 192 lb 12.8 oz (87.454 kg), SpO2 95.00%. General: Well developed, well nourished WF in no acute distress. Head: Normocephalic, atraumatic, sclera non-icteric, no xanthomas, nares are without discharge. Neck: JVP not elevated. Lungs: Bilateral crackles without wheezes, rhonchi. Breathing is unlabored. Heart: Irregularly irregular S1 S2 without murmurs, rubs, or gallops.  Abdomen: Soft, non-tender, non-distended with normoactive bowel sounds. No rebound/guarding. Extremities: No clubbing or cyanosis. 1+ RLE pitting edema. Distal pedal pulses are 2+ and equal bilaterally. Neuro: Alert and oriented X 3. Moves all extremities spontaneously. Psych:  Responds to questions appropriately with a normal affect.   Intake/Output Summary (Last 24 hours) at 11/15/13 1107 Last data filed at 11/15/13 0900  Gross per 24 hour  Intake    360 ml  Output    825 ml  Net   -465 ml    Inpatient Medications:  . amiodarone  400 mg Oral BID  . apixaban  5 mg Oral BID  . furosemide  40 mg Oral BID  . metoprolol tartrate  37.5 mg Oral BID  . sertraline  25 mg Oral Daily   Infusions:    Labs:  Recent Labs  11/14/13 0410 11/15/13 0308  NA 134* 127*  K 3.6* 3.8  CL 81* 81*  CO2 44* 39*  GLUCOSE 94 106*  BUN 20 22  CREATININE 0.73 0.68  CALCIUM 8.3* 8.4    Recent Labs  11/14/13 0410  AST 28  ALT 24  ALKPHOS 68  BILITOT 0.4  PROT 6.5  ALBUMIN 2.5*    Recent Labs  11/14/13 0410 11/15/13 0308  WBC 6.7 7.6  HGB 10.5* 10.5*  HCT 32.2* 32.2*  MCV 94.4 94.4  PLT 473* 485*   Radiology/Studies:  Dg Chest 2 View  10/29/2013   CLINICAL DATA:   Followup pneumonia, chronic shortness of breath  EXAM: CHEST  2 VIEW  COMPARISON:  Prior chest x-ray 10/20/2013 ; prior CT scan of the chest 10/11/2013  FINDINGS: Slight interval worsening of patchy areas of interstitial and airspace opacity predominantly throughout the right lung with relative sparing of the left lung compared to 10/20/2013. Bilateral pleural effusions have slightly enlarged in her now associated with bibasilar atelectasis. Stable cardiomegaly. Atherosclerotic calcification present with in the transverse aorta. No acute osseous abnormality.  IMPRESSION: 1. Slight interval progression of multifocal irregular patchy interstitial and airspace opacities predominantly throughout the right lung compared to 10/20/2013. The interval change may reflect slightly increased pulmonary vascular congestion/mild edema superimposed on the underlying presumed pneumonia. Alternately, this could represent progressive pleural parenchymal scarring. A neoplastic process is considered significantly less likely given the relatively normal appearance of the lung fields on 09/24/2013. 2. Stable cardiomegaly. 3. Slightly enlarged bilateral layering pleural effusions with associated bibasilar atelectasis.   Electronically Signed   By: Jacqulynn Cadet M.D.   On: 10/29/2013 15:23   Dg Chest 2 View  10/20/2013   CLINICAL DATA:  Pneumonia  EXAM: CHEST  2 VIEW  COMPARISON:  PA and lateral chest of October 17, 2013  FINDINGS: The lungs are better inflated today. The interstitial markings remain coarse but the fluffy alveolar infiltrates are improving. There remain small bilateral pleural effusions. The cardiopericardial silhouette remains  enlarged. The central pulmonary vascularity is prominent without significant cephalization. The bony thorax exhibits no acute abnormalities.  IMPRESSION: Improvement in the appearance of the pulmonary interstitium is consistent with ongoing resolution of clinical pneumonia. Small bilateral  pleural effusions persist.   Electronically Signed   By: David  Martinique   On: 10/20/2013 07:46   Dg Chest 2 View  10/17/2013   CLINICAL DATA:  80 year old female shortness of Breath. Pneumonia. atrial fibrillation Initial encounter.  EXAM: CHEST  2 VIEW  COMPARISON:  10/15/2013 and earlier.  FINDINGS: Portable AP semi upright view at 0711 hrs. Interval larger and more confluent bilateral nodular pulmonary opacities. As before, the left base is most heavily affected. Mild veiling opacity at both lung bases suggesting small effusions. Stable cardiac size and mediastinal contours. Visualized tracheal air column is within normal limits.  IMPRESSION: Radiographic progression of bilateral pneumonia, with larger more confluent bilateral opacities. Small effusions.   Electronically Signed   By: Lars Pinks M.D.   On: 10/17/2013 07:46   Dg Chest Port 1 View  11/10/2013   CLINICAL DATA:  Pulmonary edema.  EXAM: PORTABLE CHEST - 1 VIEW  COMPARISON:  11/09/2013.  09/24/2013.  FINDINGS: Mediastinum and hilar structures are unremarkable. Stable cardiomegaly. Persistent diffuse pulmonary infiltrates again noted. Persistent small pleural effusions. Cardiomegaly unchanged. These findings are consistent with persistent congestive heart failure pulmonary edema. Bilateral multifocal pneumonia cannot be excluded. ARDS cannot be excluded.  IMPRESSION: Persistent cardiomegaly and unchanged bilateral pulmonary alveolar infiltrates and small pleural effusions consistent with congestive heart failure and pulmonary edema. Bilateral pneumonia and/or ARDS cannot be excluded.   Electronically Signed   By: Marcello Moores  Register   On: 11/10/2013 07:56   Dg Chest Port 1 View  11/09/2013   CLINICAL DATA:  Shortness of breath  EXAM: PORTABLE CHEST - 1 VIEW  COMPARISON:  Prior radiograph from 10/29/2013  FINDINGS: Cardiomegaly is grossly stable relative to prior exam. Mediastinal silhouette within normal limits. Atherosclerotic calcifications present  within the aortic arch.  Lungs are mildly hypoinflated. Small bilateral pleural effusions are present, left larger than right. There are widespread multi focal parenchymal patchy opacities throughout both lungs, right worse than left. Diffuse interstitial thickening with pulmonary vascular congestion is present. Altogether, these findings are favored to reflect diffuse pulmonary edema, although superimposed infection is not entirely excluded. Bibasilar atelectasis is present.  No pneumothorax.  No acute osseus abnormality.  IMPRESSION: 1. Extensive multi focal irregular patchy airspace opacities throughout the bilateral lungs, right greater than left, worsened as compared to most recent radiograph from 10/29/2013. Findings are favored to reflect moderate diffuse pulmonary edema, although superimposed infection/pneumonia could also be considered in the correct clinical setting. 2. Bilateral pleural effusions, left greater than right, with associated bibasilar atelectasis. 3. Stable cardiomegaly.   Electronically Signed   By: Jeannine Boga M.D.   On: 11/09/2013 07:00   Dg Swallowing Func-speech Pathology  10/16/2013   Assunta Curtis, CCC-SLP     10/16/2013 12:17 PM Objective Swallowing Evaluation: Modified Barium Swallowing Study   Patient Details  Name: Julie Haas MRN: [id number] Date of Birth: 1925-12-09  Today's Date: 10/16/2013 Time: 1145-1200 SLP Time Calculation (min): 15 min  Past Medical History:  Past Medical History  Diagnosis Date  . Hyperlipidemia   . Arrhythmia     PVC  . Retinal detachment   . Renal disorder   . Allergy   . Occlusion and stenosis of carotid artery without mention of  cerebral infarction 03/24/2012  .  Pneumonia, organism unspecified 11/192013  . Depressive disorder, not elsewhere classified 03/05/2011  . Other premature beats 03/05/2011  . Other seborrheic keratosis 11/20/2010  . Syncope and collapse 10/02/2010  . Palpitations 11/21/2009  . Unspecified essential hypertension  06/27/2009  . Cardiomegaly 06/27/2009  . Other generalized ischemic cerebrovascular disease 06/27/2009  . Dizziness and giddiness 06/27/2009  . Unspecified vitamin D deficiency 05/16/2009  . Family history of sudden cardiac death (SCD) Dec 24, 2008  . Rash and other nonspecific skin eruption 07/05/2008  . Edema 03/15/2008  . Obesity, unspecified 07/25/2006  . Varicose veins of lower extremities with inflammation 03/16/2003   . Shortness of breath    Past Surgical History:  Past Surgical History  Procedure Laterality Date  . Cholecystectomy  1983  . Cataract extraction Bilateral     Dr. [name]  . Tonsillectomy    . Keratosis    . Retinal detachment surgery Left   . Cardioversion N/A 10/01/2013    Procedure: CARDIOVERSION;  Surgeon: Sanda Klein, MD;   Location: [facility] ENDOSCOPY;  Service: Cardiovascular;  Laterality:  N/A;  . Tee without cardioversion N/A 10/01/2013    Procedure: TRANSESOPHAGEAL ECHOCARDIOGRAM (TEE);  Surgeon:  Sanda Klein, MD;  Location: [facility] ENDOSCOPY;  Service:  Cardiovascular;  Laterality: N/A;   HPI:  80yo female with dx of acute respiratory failure in the setting  of bilateral R>L PNA.  Hx HTN, AFib, CHF, recent admit for PNA  (d/c 8/15). Returned 8/24 with worsening SOB, cough, chills. CXR  revealed multifocal PNA and pt admitted by [facility]. Course has been  c/b AFib with RVR and acute on chronic dCHF. Respiratory status  worse overnight requiring NRB; now back on [place].  Swallow eval  ordered to r/o aspiration.       Assessment / Plan / Recommendation Clinical Impression  Dysphagia Diagnosis: Within Functional Limits Clinical impression: Pt presents with quite functional swallow  with adequate mastication, age-related swallow delays, mild  vallecular residue of solids, and consistent/reliable laryngeal  closure with all tested consistencies.  No penetration/aspiration  observed.  Esophageal sweep revealed barium stasis of solid  materials; liquid wash facilitated clearance.  Recommend  continued regular  consistency diet, thin liquids - no SLP f/u  warranted.             Diet Recommendation Regular;Thin liquid   Liquid Administration via: Cup;Straw Medication Administration: Whole meds with liquid Supervision: Patient able to self feed Compensations: Follow solids with liquid    Other  Recommendations Oral Care Recommendations: Oral care BID   Follow Up Recommendations  None           SLP Swallow Goals  n/a   General Date of Onset: 10/11/13 HPI: 80yo female with dx of acute respiratory failure in the  setting of bilateral R>L PNA.  Hx HTN, AFib, CHF, recent admit  for PNA (d/c 8/15). Returned 8/24 with worsening SOB, cough,  chills. CXR revealed multifocal PNA and pt admitted by [facility].  Course has been c/b AFib with RVR and acute on chronic dCHF.  Respiratory status worse overnight requiring NRB; now back on [place].   Swallow eval ordered to r/o aspiration.   Type of Study: Modified Barium Swallowing Study Reason for Referral: Objectively evaluate swallowing function Previous Swallow Assessment: bedside eval 10/15/13 Diet Prior to this Study: Regular;Thin liquids Temperature Spikes Noted: No Respiratory Status: Nasal cannula History of Recent Intubation: No Behavior/Cognition: Alert;Cooperative;Pleasant mood Oral Cavity - Dentition: Adequate natural dentition Oral Motor / Sensory Function: Within functional limits Self-Feeding Abilities:  Able to feed self Patient Positioning: Upright in chair Baseline Vocal Quality: Breathy;Hoarse Volitional Cough: Strong Volitional Swallow: Able to elicit Anatomy: Within functional limits Pharyngeal Secretions: Not observed secondary MBS    Reason for Referral Objectively evaluate swallowing function   Oral Phase Oral Preparation/Oral Phase Oral Phase: WFL   Pharyngeal Phase Pharyngeal Phase Pharyngeal Phase: Impaired Pharyngeal - Thin Pharyngeal - Thin Cup: Delayed swallow initiation Pharyngeal - Solids Pharyngeal - Puree: Delayed swallow initiation;Pharyngeal residue  - valleculae   Cervical Esophageal Phase    GO   Amanda L. Napoleon, [place] CCC/SLP Pager [phone]  Cervical Esophageal Phase Cervical Esophageal Phase: Impaired Cervical Esophageal Phase - Solids Puree: Other (Comment) (barium stasis; clears with liquid wash)         [name] 10/16/2013, 12:16 PM      Assessment and Plan  1. Acute respiratory failure likely due to CHF and possible HCAP, tx with abx 2. Paroxysmal now persistent atrial fibrillation s/p recent TEE/DCCV 10/01/13 - now on amiodarone since DCCV did not hold - she missed Eliquis dose day of admission due to somnolence/NPO while on bipap - she also states she has a bottle of medicine at home that she did not recognize the prescribing physician so did not take it - have asked nursing to try to reach son to clarify. Regardless, we do not feel comfortable proceeding with DCCV without TEE in this setting thus plan TEE/DCCV today - consider rehab at DC given questionable med compliance 3. Acute on chronic diastolic CHF  -weight 454->[id number] (was 203 in the office recently), -9L thus far - not clear that weights or I/O's totally accurate  - continue oral Lasix and add daily KCl 4. RLE cellulitis - LE duplex negative for DVT 5. HTN, controlled 6. Hyponatremia ? d/t diuresis and/or lung process 7. Anemia/thrombocytosis without evidence of bleeding  Signed, Melina Copa PA-C

## 2013-11-16 ENCOUNTER — Encounter (HOSPITAL_COMMUNITY): Payer: Self-pay | Admitting: Internal Medicine

## 2013-11-16 LAB — CBC
HEMATOCRIT: 32.3 % — AB (ref 36.0–46.0)
HEMOGLOBIN: 10.7 g/dL — AB (ref 12.0–15.0)
MCH: 30.1 pg (ref 26.0–34.0)
MCHC: 33.1 g/dL (ref 30.0–36.0)
MCV: 90.7 fL (ref 78.0–100.0)
Platelets: 557 10*3/uL — ABNORMAL HIGH (ref 150–400)
RBC: 3.56 MIL/uL — ABNORMAL LOW (ref 3.87–5.11)
RDW: 13.5 % (ref 11.5–15.5)
WBC: 8.7 10*3/uL (ref 4.0–10.5)

## 2013-11-16 LAB — BASIC METABOLIC PANEL
ANION GAP: 11 (ref 5–15)
BUN: 16 mg/dL (ref 6–23)
CALCIUM: 8.3 mg/dL — AB (ref 8.4–10.5)
CHLORIDE: 79 meq/L — AB (ref 96–112)
CO2: 37 meq/L — AB (ref 19–32)
Creatinine, Ser: 0.56 mg/dL (ref 0.50–1.10)
GFR calc Af Amer: >90 mL/min (ref >90)
GFR calc non Af Amer: 83 mL/min — ABNORMAL LOW (ref >90)
Glucose, Bld: 106 mg/dL — ABNORMAL HIGH (ref 70–99)
Potassium: 3.6 mEq/L — ABNORMAL LOW (ref 3.7–5.3)
Sodium: 127 mEq/L — ABNORMAL LOW (ref 137–147)

## 2013-11-16 NOTE — Clinical Social Work Note (Signed)
CSW spoke with doctor who stated patient will likely be ready for DC later this week.  CSW updated Narda Rutherford at [facility] with discharge disposition and Narda Rutherford reported that SNF bed will still be available at that time.  CSW will continue to follow.  Domenica Reamer, [place] Social Worker [phone]

## 2013-11-16 NOTE — Progress Notes (Signed)
Utilization Review Completed.  

## 2013-11-16 NOTE — Progress Notes (Signed)
Patient ID: Julie Haas, female   DOB: [birth year], 80 y.o.   MRN: [id number]  [facility] PROGRESS NOTE  Julie Haas GEX:[id number] DOB: July 27, 1925 DOA: 11/09/2013 PCP: Estill Dooms, MD  Brief narrative: 80 yo ? [facility] resident, h/o  Htn, hld, mild AoS multiple admissions 9/2, 9/22 presented with respiratory failure requiring BiPAP due to acute diastolic HF, atrial fib with RVR and possible HCAP. Low blood pressures have made cardizem gtt problematic. Transitioned to amiodarone drip. Cardiology sitting with management  Assessment/Plan:  Acute respiratory failure with hypoxia:  - secondary to acute on chronic diastolic CHF with ? underlying PNA - continue Lasix, weight is trending down: -9 liters so far this admit -[id number] lbs --> 192 lbs --> pending this AM (weight of 215 lbs ? Error) - cough is resolving, completed treatment with Levaquin PO 9/26 - currently on O2 via [place] 2 L, maintaining oxygen saturations Atrial fibrillation  - S/P TEE CV on 10/01/13 but this did not hold.  - HR in 100's over the past 24 hours  - continue Metoprolol 37.5 twice a day, Apixaban 5 mg twice a day, Amiodarone 400 mg PO BID - DCCV aborted as TEE showed potential thrombus - appreciate cardiology assistance  Acute on chronic diastolic heart failure:  - weight down: 197 lbs --> 192 lbs, pending this AM - continue Lasix  - follow up on cardiology recommendations  Hyponatremia - possibly from lasix, close monitoring, follow up on weight this AM - may need to lower the dose of Lasix if Na continues trending down  - repeat BMP in AM -Liberalized diet to regular with regular amount of salt HTN  - reasonable inpatient control  Possible HCAP  - was on Vancomycin and Cefepime, completed 4 days treatment - now on Levaquin PO (started 9/25), completed 3 days treatment  Lung nodule - needs repeat CAT scan as an outpatient once euvolemic  Hypokalemia  - from lasix, supplemented and WNL this  AM Anemia of chronic disease - Hg remains stable, no signs of active bleeding  - repeat CBC in AM Deconditioning - PT evaluation completed, SNF recommended   Code Status: DNR  Family Communication: No family at bedside, pt verbalized understanding plan  Disposition Plan: SNF when stable   DVT prophylaxis  Apixaban  IV Access:   Peripheral IV Procedures and diagnostic studies:    No results found.  Medical Consultants:   Cardiology Other Consultants:   Physical therapy  Anti-Infectives:    Vancomycin, Cefepime 9/22 - 9/25   Levaquin 9/25 - 9/28  Verneita Griffes, MD [facility] [phone]  11/16/2013, 1:37 PM   LOS: 7 days   HPI/Subjective:  Doing okay. No issues. Alert oriented cognizant and aware of where she is. No chest pain Mild nausea this morning Tolerating some diet  Objective: Filed Vitals:   11/16/13 0341 11/16/13 0801 11/16/13 0912 11/16/13 1143  BP: 139/80 141/77 129/81 100/62  Pulse: 106 102  95  Temp: 97.8 F (36.6 C) 98.2 F (36.8 C)  98.2 F (36.8 C)  TempSrc: Oral Oral  Oral  Resp: 22 17  24   Height:      Weight:      SpO2: 91% 97%  95%    Intake/Output Summary (Last 24 hours) at 11/16/13 1337 Last data filed at 11/16/13 0917  Gross per 24 hour  Intake    220 ml  Output    175 ml  Net     45 ml  Exam:   General:  Pt is alert, follows commands appropriately, not in acute distress  Cardiovascular: Irregular rate and rhythm, no murmurs, no rubs, no gallops  Respiratory: Clear to auscultation bilaterally, no wheezing, diminished breath sounds at bases   Abdomen: Soft, non tender, non distended, bowel sounds present, no guarding  Extremities: No edema, pulses DP and PT palpable bilaterally no lower extremity edema   Data Reviewed: Basic Metabolic Panel:  Recent Labs Lab 11/12/13 0520 11/13/13 0313 11/14/13 0410 11/15/13 0308 11/16/13 0256  NA 135* 132* 134* 127* 127*  K 2.7* 3.2* 3.6* 3.8 3.6*  CL 82* 80*  81* 81* 79*  CO2 42* 41* 44* 39* 37*  GLUCOSE 88 84 94 106* 106*  BUN 19 18 20 22 16   CREATININE 0.63 0.53 0.73 0.68 0.56  CALCIUM 8.1* 8.1* 8.3* 8.4 8.3*  MG 1.6  --   --   --   --    Liver Function Tests:  Recent Labs Lab 11/14/13 0410  AST 28  ALT 24  ALKPHOS 68  BILITOT 0.4  PROT 6.5  ALBUMIN 2.5*   CBC:  Recent Labs Lab 11/10/13 0038 11/13/13 0313 11/14/13 0410 11/15/13 0308 11/16/13 0256  WBC 12.3* 6.6 6.7 7.6 8.7  HGB 9.9* 10.9* 10.5* 10.5* 10.7*  HCT 29.0* 32.5* 32.2* 32.2* 32.3*  MCV 94.5 91.5 94.4 94.4 90.7  PLT 292 447* 473* 485* 557*   Cardiac Enzymes:  Recent Labs Lab 11/09/13 1503 11/09/13 2150 11/10/13 0038  TROPONINI <0.30 <0.30 <0.30   CBG:  Recent Labs Lab 11/09/13 1710 11/09/13 2140 11/10/13 0756 11/10/13 1237  GLUCAP 181* 128* 110* 110*    Recent Results (from the past 240 hour(s))  MRSA PCR SCREENING     Status: None   Collection Time    11/09/13 10:14 AM      Result Value Ref Range Status   MRSA by PCR NEGATIVE  NEGATIVE Final   Comment:            The GeneXpert MRSA Assay (FDA     approved for NASAL specimens     only), is one component of a     comprehensive MRSA colonization     surveillance program. It is not     intended to diagnose MRSA     infection nor to guide or     monitor treatment for     MRSA infections.     Scheduled Meds: . amiodarone  400 mg Oral BID  . apixaban  5 mg Oral BID  . furosemide  40 mg Oral BID  . metoprolol tartrate  37.5 mg Oral BID  . potassium chloride  20 mEq Oral Daily  . sertraline  25 mg Oral Daily   Continuous Infusions:

## 2013-11-16 NOTE — Progress Notes (Signed)
Subjective: Was resting comfortably when I entered.  No PND or SOB beyond baseline.  Objective: Vital signs in last 24 hours: Temp:  [97.7 F (36.5 C)-98.2 F (36.8 C)] 98.2 F (36.8 C) (09/29 0801) Pulse Rate:  [78-120] 102 (09/29 0801) Resp:  [17-31] 17 (09/29 0801) BP: (78-142)/(38-101) 129/81 mmHg (09/29 0912) SpO2:  [91 %-100 %] 97 % (09/29 0801) Last BM Date: 11/14/13  Intake/Output from previous day: 09/28 0701 - 09/29 0700 In: 100 [I.V.:100] Out: 100 [Urine:100] Intake/Output this shift: Total I/O In: 120 [P.O.:120] Out: 175 [Urine:175]  Medications Current Facility-Administered Medications  Medication Dose Route Frequency Provider Last Rate Last Dose  . amiodarone (PACERONE) tablet 400 mg  400 mg Oral BID Peter M Martinique, MD   400 mg at 11/16/13 0906  . apixaban (ELIQUIS) tablet 5 mg  5 mg Oral BID Hosie Poisson, MD   5 mg at 11/16/13 0908  . furosemide (LASIX) tablet 40 mg  40 mg Oral BID Jacolyn Reedy, MD   40 mg at 11/16/13 0906  . guaiFENesin-dextromethorphan (ROBITUSSIN DM) 100-10 MG/5ML syrup 10 mL  10 mL Oral Q6H PRN Hosie Poisson, MD      . levalbuterol (XOPENEX) nebulizer solution 0.63 mg  0.63 mg Nebulization Q4H PRN Hosie Poisson, MD   0.63 mg at 11/10/13 2159  . metoprolol tartrate (LOPRESSOR) tablet 37.5 mg  37.5 mg Oral BID Peter M Martinique, MD   37.5 mg at 11/16/13 0905  . nitroGLYCERIN (NITROSTAT) SL tablet 0.4 mg  0.4 mg Sublingual Q5 min PRN Everlene Balls, MD   0.4 mg at 11/09/13 0653  . potassium chloride SA (K-DUR,KLOR-CON) CR tablet 20 mEq  20 mEq Oral Daily Dayna N Dunn, PA-C   20 mEq at 11/16/13 0906  . sertraline (ZOLOFT) tablet 25 mg  25 mg Oral Daily Hosie Poisson, MD   25 mg at 11/16/13 8242    PE: General appearance: alert, cooperative and no distress Neck:  No JVD Lungs: Difuse coarse crackles on the right and mild wheeze on the left.  Heart: irregularly irregular rhythm and no MM Abdomen: +BS nontender Extremities: No LEE on the left.   1+ on the right. Pulses: 2+ and symmetric Skin: Warm and dry Neurologic: Grossly normal  Lab Results:   Recent Labs  11/14/13 0410 11/15/13 0308 11/16/13 0256  WBC 6.7 7.6 8.7  HGB 10.5* 10.5* 10.7*  HCT 32.2* 32.2* 32.3*  PLT 473* 485* 557*   BMET  Recent Labs  11/14/13 0410 11/15/13 0308 11/16/13 0256  NA 134* 127* 127*  K 3.6* 3.8 3.6*  CL 81* 81* 79*  CO2 44* 39* 37*  GLUCOSE 94 106* 106*  BUN 20 22 16   CREATININE 0.73 0.68 0.56  CALCIUM 8.3* 8.4 8.3*    Assessment/Plan  Active Problems:   Acute respiratory failure with hypoxia  Resolved.  Atrial fibrillation with rapid ventricular response SP TEE yesterday with indications that there is thrombus in the AP.  On eliquis, lopressor 37.5 BID and Amio 400mg  BID. HR stable in the 80's.  Consider DCCV in 30 days.    Acute diastolic heart failure Unilateral edema on the right(cellulitis).  No JVD.  Net fluids: 0/-9.1L.  Nothing charted in the last 24 hours.  On PO lasix 40mg  BID.  Scr stable.  Wt yesterday was 192.  Down form 203-207.  Its hard to tell what her fluid status actually is.  No JVD.  Recheck a CXR today and BNP in AM.  HTN (hypertension)  BP stable currently.  Was hypotensive at midnight.     Hyperlipidemia     HCAP (healthcare-associated pneumonia)  SP vanc and cefepime   Lung nodule   Hypokalemia  Give an extra 40 Meq today.    Respiratory distress   Cellulitis, Right LE. LE duplex negative for DVT.    LOS: 7 days    Anneth Brunell PA-C 11/16/2013 11:45 AM

## 2013-11-16 NOTE — Progress Notes (Signed)
PT Cancellation Note  Patient Details Name: Julie Haas MRN: [id number] DOB: September 14, 1925   Cancelled Treatment:    Reason Treat Not Completed: Patient not medically ready. Noted results of TEE (thrombus Lt [place]). Noted pt on Eliquis, however has had missed doses. Await Cardiology to clarify when OK to proceed with PT/activity.   Thank you,  Shakima Nisley 11/16/2013, 10:41 AM Pager [phone]

## 2013-11-16 NOTE — Progress Notes (Signed)
Patient seen and examined and agree with note as outlined by Tarri Fuller PA-C.  HR better controlled in afib on Amio.  ? LAA thrombus on TEE yesterday so will anticoagulate for 30 days prior to DCCV.  There is some question whether patient is taking her meds correctly at home.  She seemed very confused about her meds when I talked to her yesterday.  She is now 9L neg and now back on PO lasix.  Agree that it is difficult to determine her fluid status.  She has crackles at her bases but this could be atelectasis.  Will get a chest xray in the am and BNP to assess further. O2 sats 95% on RA.  Weights have not been done daily.  Will check daily.

## 2013-11-16 NOTE — Progress Notes (Signed)
While transferring patient from bedside commode back to bed this evening, she stated she was feeling very weak. Nurse aide informed me and we transferred patient together. We used gait belt appropriately and gave step-by-step instructions to patient. She had extreme weakness in lower extremities and was able to bear minimal weight on lower legs. She had trouble even shuffling her feet the 2 steps to the bed. Her feet kept slipping from under her. We were able to get her to the bed, but had to call for a third person to reposition her properly. Educated patient on fall risk and informed her to not get out of bed without help. Patient stated understanding and agreed. Bed alarm activated.

## 2013-11-17 ENCOUNTER — Inpatient Hospital Stay (HOSPITAL_COMMUNITY): Payer: Medicare Other

## 2013-11-17 ENCOUNTER — Encounter (HOSPITAL_COMMUNITY): Payer: Self-pay | Admitting: Anesthesiology

## 2013-11-17 ENCOUNTER — Encounter (HOSPITAL_COMMUNITY): Payer: Self-pay | Admitting: Internal Medicine

## 2013-11-17 LAB — CBC WITH DIFFERENTIAL/PLATELET
BASOS ABS: 0 10*3/uL (ref 0.0–0.1)
Basophils Relative: 0 % (ref 0–1)
Eosinophils Absolute: 0.1 10*3/uL (ref 0.0–0.7)
Eosinophils Relative: 2 % (ref 0–5)
HCT: 32.6 % — ABNORMAL LOW (ref 36.0–46.0)
Hemoglobin: 10.9 g/dL — ABNORMAL LOW (ref 12.0–15.0)
LYMPHS PCT: 17 % (ref 12–46)
Lymphs Abs: 1.4 10*3/uL (ref 0.7–4.0)
MCH: 31.1 pg (ref 26.0–34.0)
MCHC: 33.4 g/dL (ref 30.0–36.0)
MCV: 92.9 fL (ref 78.0–100.0)
Monocytes Absolute: 0.8 10*3/uL (ref 0.1–1.0)
Monocytes Relative: 9 % (ref 3–12)
NEUTROS ABS: 6 10*3/uL (ref 1.7–7.7)
NEUTROS PCT: 72 % (ref 43–77)
Platelets: 491 10*3/uL — ABNORMAL HIGH (ref 150–400)
RBC: 3.51 MIL/uL — ABNORMAL LOW (ref 3.87–5.11)
RDW: 13.9 % (ref 11.5–15.5)
WBC: 8.4 10*3/uL (ref 4.0–10.5)

## 2013-11-17 LAB — COMPREHENSIVE METABOLIC PANEL
ALK PHOS: 70 U/L (ref 39–117)
ALT: 19 U/L (ref 0–35)
ANION GAP: 9 (ref 5–15)
AST: 21 U/L (ref 0–37)
Albumin: 2.6 g/dL — ABNORMAL LOW (ref 3.5–5.2)
BILIRUBIN TOTAL: 0.6 mg/dL (ref 0.3–1.2)
BUN: 15 mg/dL (ref 6–23)
CO2: 37 mEq/L — ABNORMAL HIGH (ref 19–32)
CREATININE: 0.59 mg/dL (ref 0.50–1.10)
Calcium: 8.4 mg/dL (ref 8.4–10.5)
Chloride: 82 mEq/L — ABNORMAL LOW (ref 96–112)
GFR calc non Af Amer: 81 mL/min — ABNORMAL LOW (ref >90)
GLUCOSE: 152 mg/dL — AB (ref 70–99)
POTASSIUM: 3.6 meq/L — AB (ref 3.7–5.3)
Sodium: 128 mEq/L — ABNORMAL LOW (ref 137–147)
Total Protein: 6.9 g/dL (ref 6.0–8.3)

## 2013-11-17 LAB — PRO B NATRIURETIC PEPTIDE: PRO B NATRI PEPTIDE: 1747 pg/mL — AB (ref 0–450)

## 2013-11-17 LAB — PROTIME-INR
INR: 1.86 — AB (ref 0.00–1.49)
Prothrombin Time: 21.4 seconds — ABNORMAL HIGH (ref 11.6–15.2)

## 2013-11-17 MED ORDER — FUROSEMIDE 10 MG/ML IJ SOLN
40.0000 mg | Freq: Two times a day (BID) | INTRAMUSCULAR | Status: AC
  Administered 2013-11-17 – 2013-11-18 (×2): 40 mg via INTRAVENOUS
  Filled 2013-11-17 (×2): qty 4

## 2013-11-17 MED ORDER — FUROSEMIDE 10 MG/ML IJ SOLN: 40.0000 mg | Freq: Once | INTRAMUSCULAR | Status: DC

## 2013-11-17 MED ORDER — POTASSIUM CHLORIDE CRYS ER 20 MEQ PO TBCR
20.0000 meq | EXTENDED_RELEASE_TABLET | Freq: Two times a day (BID) | ORAL | Status: DC
  Administered 2013-11-17 – 2013-11-18 (×3): 20 meq via ORAL
  Filled 2013-11-17 (×3): qty 1

## 2013-11-17 NOTE — Anesthesia Postprocedure Evaluation (Deleted)
  Anesthesia Post-op Note  Patient: Julie Haas  Procedure(s) Performed: Procedure(s): TRANSESOPHAGEAL ECHOCARDIOGRAM (TEE) (N/A)  Patient Location: PACU  Anesthesia Type: MAC  Level of Consciousness: awake and alert   Airway and Oxygen Therapy: Patient Spontanous Breathing  Post-op Pain: none  Post-op Assessment: Post-op Vital signs reviewed, Patient's Cardiovascular Status Stable and Respiratory Function Stable  Post-op Vital Signs: Reviewed  Filed Vitals:   11/17/13 0802  BP: 119/76  Pulse: 89  Temp: 36.5 C  Resp: 19    Complications: No apparent anesthesia complications

## 2013-11-17 NOTE — Anesthesia Postprocedure Evaluation (Signed)
  Anesthesia Post-op Note  Patient: Julie Haas  Procedure(s) Performed: Procedure(s): TRANSESOPHAGEAL ECHOCARDIOGRAM (TEE) (N/A)  Patient Location: PACU  Anesthesia Type: MAC  Level of Consciousness: awake and alert   Airway and Oxygen Therapy: Patient Spontanous Breathing  Post-op Pain: none  Post-op Assessment: Post-op Vital signs reviewed, Patient's Cardiovascular Status Stable and Respiratory Function Stable  Post-op Vital Signs: Reviewed  Filed Vitals:   11/17/13 0802  BP: 119/76  Pulse: 89  Temp: 36.5 C  Resp: 19    Complications: No apparent anesthesia complications

## 2013-11-17 NOTE — Progress Notes (Signed)
Patient ID: Julie Haas, female   DOB: Aug 30, 1925, 80 y.o.   MRN: [id number]  [facility] PROGRESS NOTE  Julie Haas [id number] DOB: May 21, 1925 DOA: 11/09/2013 PCP: Estill Dooms, MD  Brief narrative: 80 yo ? [place] resident, h/o  Htn, hld, mild AoS multiple admissions 9/2, 9/22 presented with respiratory failure requiring BiPAP due to acute diastolic HF, atrial fib with RVR and possible HCAP. Low blood pressures have made cardizem gtt problematic. Transitioned to amiodarone drip. Cardiology asissting with management c regards to Afib and decompensated Diastolic HF  Assessment/Plan:  Acute respiratory failure with hypoxia:  - secondary to acute on chronic diastolic CHF with ? underlying PNA - continue Lasix 40 po bd, weight is trending down: -9.4 liters so far this admit -Weights197 lbs --> 192 lbs --> 194  - cough is resolving, completed treatment with Levaquin PO 9/26 - currently on O2 via [place] 2-4 L, maintaining oxygen saturations Possible HCAP  - was on Vancomycin and Cefepime, completed 4 days treatment - now on Levaquin PO (started 9/25), completed 3 days treatment  Atrial fibrillation  - S/P TEE CV on 10/01/13 but this did not hold.  - HR in 100's over the past 24 hours  - continue Metoprolol 37.5 twice a day, Apixaban 5 mg twice a day, Amiodarone 400 mg PO BID.  Canot uptitrate BB 2/2 to hypotension - DCCV aborted as TEE showed potential thrombus - appreciate cardiology assistance  Acute on chronic diastolic heart failure:  - weight down: 197 lbs --> 192-->194 - continue Lasix 40 po bid Hyponatremia - possibly from lasix, close monitoring, follow up on weight this AM -Liberalized diet to regular with regular amount of salt HTN  - reasonable inpatient control  Lung nodule - needs repeat CAT scan as an outpatient once euvolemic  Hypokalemia  - from lasix, supplemented PO Kdur increased to 40 from 20 meq BID 9/30 Anemia of chronic disease - Hg remains stable,  no signs of active bleeding  - repeat CBC in AM Deconditioning - PT evaluation completed, SNF recommended   Code Status: DNR  Family Communication: non present Disposition Plan: SNF when stable   DVT prophylaxis  Apixaban  IV Access:   Peripheral IV Procedures and diagnostic studies:   Dg Chest 2 View  11/17/2013   CLINICAL DATA:  Congestive heart failure, shortness of breath.  EXAM: CHEST  2 VIEW  COMPARISON:  November 10, 2013.  FINDINGS: Stable cardiomegaly. Stable bilateral lung opacities are noted, with right worse than left. This is concerning for pneumonia or edema. Bilateral pleural effusions are noted with left greater than right. No pneumothorax is noted.  IMPRESSION: Mild bilateral pleural effusions are noted with left greater than right. Stable bilateral lung opacities are noted consistent with pneumonia or edema, with right worse than left.   Electronically Signed   By: Sabino Dick M.D.   On: 11/17/2013 08:23     Medical Consultants:   Cardiology Other Consultants:   Physical therapy  Anti-Infectives:    Vancomycin, Cefepime 9/22 - 9/25   Levaquin 9/25 - 9/28  Verneita Griffes, MD [facility] ([phone]  11/17/2013, 9:02 AM   LOS: 8 days   HPI/Subjective:  Doing okay.  No confusion-upset about diet which wasn;t liberalized Stool yesterday No CP SOB seems a little better    Objective: Filed Vitals:   11/17/13 0200 11/17/13 0328 11/17/13 0500 11/17/13 0802  BP: 117/70 103/55  119/76  Pulse: 55 97  89  Temp:  97.5 F (36.4 C)  97.7 F (36.5 C)  TempSrc:  Axillary  Oral  Resp: 18 29  19   Height:      Weight:   88.2 kg (194 lb 7.1 oz)   SpO2: 95% 93%  97%    Intake/Output Summary (Last 24 hours) at 11/17/13 0902 Last data filed at 11/16/13 2041  Gross per 24 hour  Intake    120 ml  Output    300 ml  Net   -180 ml    Exam:   General:  Pt is alert, follows commands appropriately, not in acute distress  Cardiovascular: Irregular  rate and rhythm, no murmurs, no rubs, no gallops  Respiratory: Clear to auscultation bilaterally, no wheezing, diminished breath sounds at bases   Abdomen: Soft, non tender, non distended, bowel sounds present, no guarding  Extremities: No edema, pulses DP and PT palpable bilaterally no lower extremity edema   Data Reviewed: Basic Metabolic Panel:  Recent Labs Lab 11/12/13 0520 11/13/13 0313 11/14/13 0410 11/15/13 0308 11/16/13 0256 11/17/13 0322  NA 135* 132* 134* 127* 127* 128*  K 2.7* 3.2* 3.6* 3.8 3.6* 3.6*  CL 82* 80* 81* 81* 79* 82*  CO2 42* 41* 44* 39* 37* 37*  GLUCOSE 88 84 94 106* 106* 152*  BUN 19 18 20 22 16 15   CREATININE 0.63 0.53 0.73 0.68 0.56 0.59  CALCIUM 8.1* 8.1* 8.3* 8.4 8.3* 8.4  MG 1.6  --   --   --   --   --    Liver Function Tests:  Recent Labs Lab 11/14/13 0410 11/17/13 0322  AST 28 21  ALT 24 19  ALKPHOS 68 70  BILITOT 0.4 0.6  PROT 6.5 6.9  ALBUMIN 2.5* 2.6*   CBC:  Recent Labs Lab 11/13/13 0313 11/14/13 0410 11/15/13 0308 11/16/13 0256 11/17/13 0322  WBC 6.6 6.7 7.6 8.7 8.4  NEUTROABS  --   --   --   --  6.0  HGB 10.9* 10.5* 10.5* 10.7* 10.9*  HCT 32.5* 32.2* 32.2* 32.3* 32.6*  MCV 91.5 94.4 94.4 90.7 92.9  PLT 447* 473* 485* 557* 491*   Cardiac Enzymes: No results found for this basename: CKTOTAL, CKMB, CKMBINDEX, TROPONINI,  in the last 168 hours CBG:  Recent Labs Lab 11/10/13 1237  GLUCAP 110*    Recent Results (from the past 240 hour(s))  MRSA PCR SCREENING     Status: None   Collection Time    11/09/13 10:14 AM      Result Value Ref Range Status   MRSA by PCR NEGATIVE  NEGATIVE Final   Comment:            The GeneXpert MRSA Assay (FDA     approved for NASAL specimens     only), is one component of a     comprehensive MRSA colonization     surveillance program. It is not     intended to diagnose MRSA     infection nor to guide or     monitor treatment for     MRSA infections.     Scheduled Meds: .  amiodarone  400 mg Oral BID  . apixaban  5 mg Oral BID  . furosemide  40 mg Oral BID  . metoprolol tartrate  37.5 mg Oral BID  . potassium chloride  20 mEq Oral Daily  . sertraline  25 mg Oral Daily   Continuous Infusions:

## 2013-11-17 NOTE — Progress Notes (Signed)
Patient seen and examined and agree with note as outlined by Almyra Deforest PA-C.  HR controlled on PO Amio load.  Still with elevated BNP and crackles and LE edema on exam.  Chest xray shows persistent edema.  Will place back on IV Lasix 40mg  IV BID and reassess in am.

## 2013-11-17 NOTE — Progress Notes (Signed)
PT Cancellation Note  Patient Details Name: Julie Haas MRN: [id number] DOB: [birth year]   Cancelled Treatment:    Reason Eval/Treat Not Completed: Patient at procedure or unavailable   Tamecka Milham 11/17/2013, 3:54 PM Pager [phone]

## 2013-11-17 NOTE — Progress Notes (Signed)
Patient Name: Julie Haas Date of Encounter: 11/17/2013     Principal Problem:   Acute respiratory failure with hypoxia Active Problems:   HTN (hypertension)   Hyperlipidemia   Atrial fibrillation with rapid ventricular response   Acute diastolic heart failure   HCAP (healthcare-associated pneumonia)   Lung nodule   Hypokalemia   Respiratory distress    SUBJECTIVE  Denies any CP or SOB. She states she is weak (because they haven't give me much food during this admission)  CURRENT MEDS . amiodarone  400 mg Oral BID  . apixaban  5 mg Oral BID  . furosemide  40 mg Oral BID  . metoprolol tartrate  37.5 mg Oral BID  . potassium chloride  20 mEq Oral BID  . sertraline  25 mg Oral Daily    OBJECTIVE  Filed Vitals:   11/17/13 0200 11/17/13 0328 11/17/13 0500 11/17/13 0802  BP: 117/70 103/55  119/76  Pulse: 55 97  89  Temp:  97.5 F (36.4 C)  97.7 F (36.5 C)  TempSrc:  Axillary  Oral  Resp: 18 29  19   Height:      Weight:   194 lb 7.1 oz (88.2 kg)   SpO2: 95% 93%  97%    Intake/Output Summary (Last 24 hours) at 11/17/13 1209 Last data filed at 11/16/13 2041  Gross per 24 hour  Intake      0 ml  Output    300 ml  Net   -300 ml   Filed Weights   11/14/13 0430 11/14/13 1051 11/17/13 0500  Weight: 215 lb 9.8 oz (97.8 kg) 192 lb 12.8 oz (87.454 kg) 194 lb 7.1 oz (88.2 kg)    PHYSICAL EXAM  General: Pleasant, NAD. Neuro: Alert and oriented X 3. Moves all extremities spontaneously. Psych: Normal affect. HEENT:  Normal  Neck: Supple without bruits or JVD. Lungs:  Resp regular and unlabored, bilateral rales 2/3 way up Heart: irregular no s3, s4, or murmurs. Abdomen: Soft, non-tender, non-distended, BS + x 4.  Extremities: No clubbing, cyanosis. DP/PT/Radials 2+ and equal bilaterally. 1+ pitting edema in RLE  Accessory Clinical Findings  CBC  Recent Labs  11/16/13 0256 11/17/13 0322  WBC 8.7 8.4  NEUTROABS  --  6.0  HGB 10.7* 10.9*  HCT 32.3*  32.6*  MCV 90.7 92.9  PLT 557* 892*   Basic Metabolic Panel  Recent Labs  11/16/13 0256 11/17/13 0322  NA 127* 128*  K 3.6* 3.6*  CL 79* 82*  CO2 37* 37*  GLUCOSE 106* 152*  BUN 16 15  CREATININE 0.56 0.59  CALCIUM 8.3* 8.4   Liver Function Tests  Recent Labs  11/17/13 0322  AST 21  ALT 19  ALKPHOS 70  BILITOT 0.6  PROT 6.9  ALBUMIN 2.6*    TELE A-fib with HR 80-100s    ECG  No new EKG  Echocardiogram 9/28  Aortic valve: AV is thickened, caclified with some restricted motion. No AI.  ------------------------------------------------------------------- Aorta: Mild fixed plaquing in the thoracic aorta.  ------------------------------------------------------------------- Mitral valve: MV is mildly thickened. Mild MR.  ------------------------------------------------------------------- Left atrium: LA appendage is large with multiple blind sacs at tip . Spontaneous contrast is present in appendage. This does not completely clear from the tip, consistent with organizing thrombus.  ------------------------------------------------------------------- Tricuspid valve: TV is normal Trace TR.  ------------------------------------------------------------------- Post procedure conclusions Ascending Aorta:  - Mild fixed plaquing in the thoracic aorta.     Radiology/Studies  Dg Chest 2 View  11/17/2013   CLINICAL DATA:  Congestive heart failure, shortness of breath.  EXAM: CHEST  2 VIEW  COMPARISON:  November 10, 2013.  FINDINGS: Stable cardiomegaly. Stable bilateral lung opacities are noted, with right worse than left. This is concerning for pneumonia or edema. Bilateral pleural effusions are noted with left greater than right. No pneumothorax is noted.  IMPRESSION: Mild bilateral pleural effusions are noted with left greater than right. Stable bilateral lung opacities are noted consistent with pneumonia or edema, with right worse than left.   Electronically  Signed   By: Sabino Dick M.D.   On: 11/17/2013 08:23   Dg Chest 2 View  10/29/2013   CLINICAL DATA:  Followup pneumonia, chronic shortness of breath  EXAM: CHEST  2 VIEW  COMPARISON:  Prior chest x-ray 10/20/2013 ; prior CT scan of the chest 10/11/2013  FINDINGS: Slight interval worsening of patchy areas of interstitial and airspace opacity predominantly throughout the right lung with relative sparing of the left lung compared to 10/20/2013. Bilateral pleural effusions have slightly enlarged in her now associated with bibasilar atelectasis. Stable cardiomegaly. Atherosclerotic calcification present with in the transverse aorta. No acute osseous abnormality.  IMPRESSION: 1. Slight interval progression of multifocal irregular patchy interstitial and airspace opacities predominantly throughout the right lung compared to 10/20/2013. The interval change may reflect slightly increased pulmonary vascular congestion/mild edema superimposed on the underlying presumed pneumonia. Alternately, this could represent progressive pleural parenchymal scarring. A neoplastic process is considered significantly less likely given the relatively normal appearance of the lung fields on 09/24/2013. 2. Stable cardiomegaly. 3. Slightly enlarged bilateral layering pleural effusions with associated bibasilar atelectasis.   Electronically Signed   By: Jacqulynn Cadet M.D.   On: 10/29/2013 15:23   Dg Chest 2 View  10/20/2013   CLINICAL DATA:  Pneumonia  EXAM: CHEST  2 VIEW  COMPARISON:  PA and lateral chest of October 17, 2013  FINDINGS: The lungs are better inflated today. The interstitial markings remain coarse but the fluffy alveolar infiltrates are improving. There remain small bilateral pleural effusions. The cardiopericardial silhouette remains enlarged. The central pulmonary vascularity is prominent without significant cephalization. The bony thorax exhibits no acute abnormalities.  IMPRESSION: Improvement in the appearance of the  pulmonary interstitium is consistent with ongoing resolution of clinical pneumonia. Small bilateral pleural effusions persist.   Electronically Signed   By: David  Martinique   On: 10/20/2013 07:46   Dg Chest Port 1 View  11/10/2013   CLINICAL DATA:  Pulmonary edema.  EXAM: PORTABLE CHEST - 1 VIEW  COMPARISON:  11/09/2013.  09/24/2013.  FINDINGS: Mediastinum and hilar structures are unremarkable. Stable cardiomegaly. Persistent diffuse pulmonary infiltrates again noted. Persistent small pleural effusions. Cardiomegaly unchanged. These findings are consistent with persistent congestive heart failure pulmonary edema. Bilateral multifocal pneumonia cannot be excluded. ARDS cannot be excluded.  IMPRESSION: Persistent cardiomegaly and unchanged bilateral pulmonary alveolar infiltrates and small pleural effusions consistent with congestive heart failure and pulmonary edema. Bilateral pneumonia and/or ARDS cannot be excluded.   Electronically Signed   By: Marcello Moores  Register   On: 11/10/2013 07:56   Dg Chest Port 1 View  11/09/2013   CLINICAL DATA:  Shortness of breath  EXAM: PORTABLE CHEST - 1 VIEW  COMPARISON:  Prior radiograph from 10/29/2013  FINDINGS: Cardiomegaly is grossly stable relative to prior exam. Mediastinal silhouette within normal limits. Atherosclerotic calcifications present within the aortic arch.  Lungs are mildly hypoinflated. Small bilateral pleural effusions are present, left larger than right. There are  widespread multi focal parenchymal patchy opacities throughout both lungs, right worse than left. Diffuse interstitial thickening with pulmonary vascular congestion is present. Altogether, these findings are favored to reflect diffuse pulmonary edema, although superimposed infection is not entirely excluded. Bibasilar atelectasis is present.  No pneumothorax.  No acute osseus abnormality.  IMPRESSION: 1. Extensive multi focal irregular patchy airspace opacities throughout the bilateral lungs, right  greater than left, worsened as compared to most recent radiograph from 10/29/2013. Findings are favored to reflect moderate diffuse pulmonary edema, although superimposed infection/pneumonia could also be considered in the correct clinical setting. 2. Bilateral pleural effusions, left greater than right, with associated bibasilar atelectasis. 3. Stable cardiomegaly.   Electronically Signed   By: Jeannine Boga M.D.   On: 11/09/2013 07:00    ASSESSMENT AND PLAN  1. Acute respiratory failure with hypoxia    2. Atrial fibrillation with rapid ventricular response   - SP TEE with indications that there is thrombus in the AP. On eliquis, lopressor 37.5 BID and Amio 400mg  BID. HR stable in the 80's. Consider DCCV in 30 days.   3. Acute diastolic heart failure   - Unilateral edema on the right(cellulitis). No JVD. On PO lasix 40mg  BID. Scr stable. Wt yesterday was 192. Down form 203-207.  - consider more IV diuresis, CXR consistent with edema vs PNA, however WBC normal, pt afebrile. Bilateral rale 2/3 way up  - also not sure why she's 40mg  PO lasix BID when record shows she's 80mg  PO lasix BID at home   4. HTN (hypertension)  5. Hyperlipidemia  6. HCAP (healthcare-associated pneumonia)   - SP vanc and cefepime  7. Lung nodule  8. Hypokalemia  9. Cellulitis, Right LE. LE duplex negative for DVT.    Signed, Almyra Deforest PA-C Pager: [phone]

## 2013-11-17 NOTE — Addendum Note (Signed)
Addendum created 11/17/13 1547 by Arabella Merles, MD   Modules edited: Notes Section   Notes Section:  File: [id number]

## 2013-11-18 DIAGNOSIS — I10 Essential (primary) hypertension: Secondary | ICD-10-CM

## 2013-11-18 DIAGNOSIS — R0600 Dyspnea, unspecified: Secondary | ICD-10-CM

## 2013-11-18 DIAGNOSIS — I5031 Acute diastolic (congestive) heart failure: Secondary | ICD-10-CM

## 2013-11-18 DIAGNOSIS — I4891 Unspecified atrial fibrillation: Secondary | ICD-10-CM

## 2013-11-18 LAB — CBC WITH DIFFERENTIAL/PLATELET
BASOS ABS: 0 10*3/uL (ref 0.0–0.1)
BASOS PCT: 0 % (ref 0–1)
EOS PCT: 3 % (ref 0–5)
Eosinophils Absolute: 0.3 10*3/uL (ref 0.0–0.7)
HEMATOCRIT: 31.8 % — AB (ref 36.0–46.0)
Hemoglobin: 10.5 g/dL — ABNORMAL LOW (ref 12.0–15.0)
Lymphocytes Relative: 18 % (ref 12–46)
Lymphs Abs: 1.7 10*3/uL (ref 0.7–4.0)
MCH: 29.9 pg (ref 26.0–34.0)
MCHC: 33 g/dL (ref 30.0–36.0)
MCV: 90.6 fL (ref 78.0–100.0)
MONO ABS: 1.3 10*3/uL — AB (ref 0.1–1.0)
Monocytes Relative: 14 % — ABNORMAL HIGH (ref 3–12)
Neutro Abs: 5.9 10*3/uL (ref 1.7–7.7)
Neutrophils Relative %: 65 % (ref 43–77)
PLATELETS: 427 10*3/uL — AB (ref 150–400)
RBC: 3.51 MIL/uL — ABNORMAL LOW (ref 3.87–5.11)
RDW: 13.8 % (ref 11.5–15.5)
WBC: 9.2 10*3/uL (ref 4.0–10.5)

## 2013-11-18 LAB — BASIC METABOLIC PANEL
ANION GAP: 10 (ref 5–15)
BUN: 14 mg/dL (ref 6–23)
CALCIUM: 8.2 mg/dL — AB (ref 8.4–10.5)
CO2: 35 meq/L — AB (ref 19–32)
CREATININE: 0.6 mg/dL (ref 0.50–1.10)
Chloride: 81 mEq/L — ABNORMAL LOW (ref 96–112)
GFR calc Af Amer: >90 mL/min (ref >90)
GFR, EST NON AFRICAN AMERICAN: 80 mL/min — AB (ref >90)
GLUCOSE: 87 mg/dL (ref 70–99)
Potassium: 4 mEq/L (ref 3.7–5.3)
Sodium: 126 mEq/L — ABNORMAL LOW (ref 137–147)

## 2013-11-18 LAB — SODIUM, URINE, RANDOM: Sodium, Ur: 67 mEq/L

## 2013-11-18 LAB — OSMOLALITY, URINE: Osmolality, Ur: 324 mOsm/kg — ABNORMAL LOW (ref 390–1090)

## 2013-11-18 MED ORDER — FUROSEMIDE 10 MG/ML IJ SOLN
40.0000 mg | Freq: Once | INTRAMUSCULAR | Status: AC
  Administered 2013-11-18: 40 mg via INTRAVENOUS
  Filled 2013-11-18: qty 4

## 2013-11-18 MED ORDER — FUROSEMIDE 10 MG/ML IJ SOLN
80.0000 mg | Freq: Two times a day (BID) | INTRAMUSCULAR | Status: DC
Start: 2013-11-18 — End: 2013-11-21
  Administered 2013-11-18 – 2013-11-21 (×6): 80 mg via INTRAVENOUS
  Filled 2013-11-18 (×10): qty 8

## 2013-11-18 MED ORDER — POTASSIUM CHLORIDE CRYS ER 20 MEQ PO TBCR
40.0000 meq | EXTENDED_RELEASE_TABLET | Freq: Two times a day (BID) | ORAL | Status: DC
  Administered 2013-11-18 – 2013-11-22 (×8): 40 meq via ORAL
  Filled 2013-11-18 (×10): qty 2

## 2013-11-18 MED ORDER — SERTRALINE HCL 25 MG PO TABS
12.5000 mg | ORAL_TABLET | Freq: Every day | ORAL | Status: DC
  Administered 2013-11-18 – 2013-11-22 (×5): 12.5 mg via ORAL
  Filled 2013-11-18 (×5): qty 0.5

## 2013-11-18 NOTE — Progress Notes (Signed)
Physical Therapy Treatment Patient Details Name: Julie Haas MRN: [id number] DOB: 02/27/1925 Today's Date: 2013/12/10    History of Present Illness pt presents with HCAP, developed afib, TEE showed large thrombus in Lt atrium, on Eliquis. PMHx-  afib with RVR and cardioversion 11/01/13; cardiomegaly, depression    PT Comments    Pt eager to work with therapy, however after exercises her lunch arrived and she very much wanted to eat (it was 1:40). Asked if I could return later today.   Follow Up Recommendations  SNF;Supervision/Assistance - 24 hour (at [facility] where she resides)     Equipment Recommendations  None recommended by PT    Recommendations for Other Services       Precautions / Restrictions Precautions Precautions: Fall Precaution Comments: orthostatic bp mildly symptomatic; subjective leg weakness    Mobility  Bed Mobility Overal bed mobility:  (not assessed, pt up in/back to chair)                Transfers                 General transfer comment: deferred due to pt's lunch arrived  Ambulation/Gait                 Stairs            Wheelchair Mobility    Modified Rankin (Stroke Patients Only)       Balance     Sitting balance-Leahy Scale: Fair                              Cognition Arousal/Alertness: Awake/alert Behavior During Therapy: WFL for tasks assessed/performed Overall Cognitive Status: Within Functional Limits for tasks assessed                      Exercises General Exercises - Lower Extremity Long Arc Quad: AROM;Both;10 reps Hip Flexion/Marching: AROM;Both;10 reps Toe Raises: AROM;Both;10 reps;Seated Heel Raises: AROM;Both;10 reps;Seated    General Comments        Pertinent Vitals/Pain   Denied pain    Home Living                      Prior Function            PT Goals (current goals can now be found in the care plan section) Acute Rehab PT  Goals Patient Stated Goal: return to independent living side of [facility] Progress towards PT goals: Progressing toward goals    Frequency  Min 2X/week    PT Plan Current plan remains appropriate    Co-evaluation             End of Session Equipment Utilized During Treatment: Oxygen (2L per [place] from wall) Activity Tolerance:  (lunch arrived (1340) and pt wanting to eat) Patient left: in chair;with call bell/phone within reach     Time: 1334-1344 PT Time Calculation (min): 10 min  Charges:  $Therapeutic Exercise: 8-22 mins                    G Codes:      Julie Haas Dec 10, 2013, 4:26 PM Pager [phone]

## 2013-11-18 NOTE — Clinical Social Work Note (Signed)
CSW spoke with Narda Rutherford in admissions at [facility] informed CSW that she will not be in the office tomorrow and all information needs to be hard faxed.  Hard fax number: [phone]  Director: Patrick North [phone]  CSW will continue to follow.  Domenica Reamer, [place] Social Worker [phone]

## 2013-11-18 NOTE — Progress Notes (Signed)
Physical Therapy Treatment Patient Details Name: Julie Haas MRN: [id number] DOB: 11-Sep-1925 Today's Date: 11/18/2013    History of Present Illness pt presents with HCAP, developed afib, TEE showed large thrombus in Lt atrium, on Eliquis. PMHx-  afib with RVR and cardioversion 11/01/13; cardiomegaly, depression    PT Comments    Pt very motivated to work on standing. Repeated sit to stand x 5 throughout session with facilitation for anterior weight shift and upper body support for upright posture. Pt had near fall due to leg weakness recently with nursing and is somewhat anxious during transfers   Follow Up Recommendations  SNF;Supervision/Assistance - 24 hour     Equipment Recommendations  None recommended by PT    Recommendations for Other Services       Precautions / Restrictions Precautions Precautions: Fall Precaution Comments: orthostatic bp mildly symptomatic; subjective leg weakness    Mobility  Bed Mobility Overal bed mobility: Needs Assistance Bed Mobility: Sit to Sidelying         Sit to sidelying: Mod assist;+2 for safety/equipment General bed mobility comments: required cues for technique and for straightening herself once supine  Transfers Overall transfer level: Needs assistance Equipment used: 1 person hand held assist (stedy) Transfers: Sit to/from [facility] Sit to Stand: Mod assist;Min assist Stand pivot transfers: Mod assist;+2 safety/equipment       General transfer comment: stood from recliner with min assist with stedy, however could not lower seat flaps for pt to sit and complete recliner to bed transfer. Returned to recliner and pivoted with PT in front of pt and pt takes very small steps to pivot to [facility] and then again to bed; leans posterior  Ambulation/Gait                 Stairs            Wheelchair Mobility    Modified Rankin (Stroke Patients Only)       Balance     Sitting balance-Leahy  Scale: Fair       Standing balance-Leahy Scale: Poor                      Cognition Arousal/Alertness: Awake/alert Behavior During Therapy: WFL for tasks assessed/performed Overall Cognitive Status: Within Functional Limits for tasks assessed                      Exercises General Exercises - Lower Extremity Long Arc Quad: AROM;Both;10 reps Hip Flexion/Marching: AROM;Both;10 reps Toe Raises: AROM;Both;10 reps;Seated Heel Raises: AROM;Both;10 reps;Seated    General Comments        Pertinent Vitals/Pain   Denied pain    Home Living                      Prior Function            PT Goals (current goals can now be found in the care plan section) Acute Rehab PT Goals Patient Stated Goal: return to independent living side of [facility] Progress towards PT goals: Progressing toward goals    Frequency  Min 2X/week    PT Plan Current plan remains appropriate    Co-evaluation             End of Session Equipment Utilized During Treatment: Oxygen (2L per [place] from wall) Activity Tolerance: Patient tolerated treatment well Patient left: with call bell/phone within reach;in bed;with nursing/sitter in room     Time: [phone]  PT Time Calculation (min): 28 min  Charges:  $Therapeutic Exercise: 8-22 mins $Therapeutic Activity: 23-37 mins                    G Codes:      Nyriah Coote 28-Nov-2013, 4:33 PM Pager [phone]

## 2013-11-18 NOTE — Progress Notes (Signed)
I have personally seen and examined patient and reviewed database. Agree with note as outlined by Tarri Fuller, PA-C.  Still volume overloaded so will increase Lasix to 80mg  IV BID which she had good response to a few days ago.  Continue Amio and BB for rate control. Need to wait 30 days for DCCV due to possibility of LA thrombus.  Continue Eliquis.

## 2013-11-18 NOTE — Progress Notes (Signed)
Patient ID: ADELAI ACHEY, female   DOB: 1925/06/23, 80 y.o.   MRN: [id number]  [facility] PROGRESS NOTE  MAAME DACK DEY:[id number] DOB: Jul 16, 1925 DOA: 11/09/2013 PCP: Estill Dooms, MD  Brief narrative: 80 yo ? [place] resident, h/o  Htn, hld, mild AoS multiple admissions 9/2, 9/22 presented with respiratory failure requiring BiPAP due to acute diastolic HF, atrial fib with RVR and possible HCAP. Low blood pressures have made cardizem gtt problematic. Transitioned to amiodarone drip. Cardiology asissting with management c regards to Afib and decompensated Diastolic HF She became progressively hyponatremic and Nephrology was consulted informally for opinion  Assessment/Plan:  Acute respiratory failure with hypoxia:  - secondary to acute on chronic diastolic CHF with ? underlying PNA - continue Lasix 40 po bd, weight is trending down: -10.2 liters so far this admit - Weights 197 lbs --> 192 lbs --> 194  - cough is resolving, completed treatment with Levaquin PO 9/26 - currently on O2 via [place] 2-4 L, maintaining oxygen saturations Possible HCAP  - was on Vancomycin and Cefepime, completed 4 days treatment - now on Levaquin PO (started 9/25), completed 3 days treatment  Atrial fibrillation  - S/P TEE CV on 10/01/13 but this did not hold.  - HR in 100's over the past 24 hours  - continue Metoprolol 37.5 twice a day, Apixaban 5 mg twice a day, Amiodarone 400 mg PO BID.  Canot uptitrate BB 2/2 to hypotension - DCCV aborted as TEE showed potential thrombus - appreciate cardiology assistance  Acute on chronic diastolic heart failure:  - weight down: 197 lbs --> 192-->194 - lasix currently IV 40 mg BID Hyponatremia - possibly from Sertraline-discussed with neprhology, recommends ot hold this as might have SIADH from this, get urine sodium/osm -Liberalized diet to regular with regular amount of salt -free water restrict 1200 cc HTN  - reasonable inpatient control  Lung nodule -  needs repeat CAT scan as an outpatient once euvolemic  Hypokalemia  - from lasix, supplemented PO Kdur increased to 40 from 20 meq BID 9/30 Anemia of chronic disease - Hg remains stable, no signs of active bleeding  - repeat CBC in AM Deconditioning - PT evaluation completed, SNF recommended   Code Status: DNR  Family Communication: non present Disposition Plan: SNF when stable   DVT prophylaxis  Apixaban  IV Access:   Peripheral IV Procedures and diagnostic studies:   Dg Chest 2 View  11/17/2013   CLINICAL DATA:  Congestive heart failure, shortness of breath.  EXAM: CHEST  2 VIEW  COMPARISON:  November 10, 2013.  FINDINGS: Stable cardiomegaly. Stable bilateral lung opacities are noted, with right worse than left. This is concerning for pneumonia or edema. Bilateral pleural effusions are noted with left greater than right. No pneumothorax is noted.  IMPRESSION: Mild bilateral pleural effusions are noted with left greater than right. Stable bilateral lung opacities are noted consistent with pneumonia or edema, with right worse than left.   Electronically Signed   By: Sabino Dick M.D.   On: 11/17/2013 08:23     Medical Consultants:   Cardiology Other Consultants:   Physical therapy  Anti-Infectives:    Vancomycin, Cefepime 9/22 - 9/25   Levaquin 9/25 - 9/28  Verneita Griffes, MD [facility] [phone]  11/18/2013, 8:43 AM   LOS: 9 days   HPI/Subjective:  Fair [place] this am No other cocners currently no cp.  Sob seems a little better    Objective: Filed Vitals:  11/18/13 0600 11/18/13 0649 11/18/13 0651 11/18/13 0806  BP: 115/66   121/72  Pulse: 100 56 113 107  Temp:    97.5 F (36.4 C)  TempSrc:    Oral  Resp: 35 28 32 34  Height:      Weight:      SpO2: 91% 85% 91% 96%    Intake/Output Summary (Last 24 hours) at 11/18/13 0843 Last data filed at 11/18/13 0650  Gross per 24 hour  Intake      0 ml  Output    750 ml  Net   -750 ml     Exam:   General:  Pt is alert, follows commands appropriately, not in acute distress  Cardiovascular: Irregular rate and rhythm, no murmurs, no rubs, no gallops  Respiratory: crackles bilaterally.  No fremitus or resonance  Abdomen: Soft, non tender, non distended, bowel sounds present, no guarding  Extremities: No edema, pulses DP and PT palpable bilaterally no lower extremity edema   Data Reviewed: Basic Metabolic Panel:  Recent Labs Lab 11/12/13 0520  11/14/13 0410 11/15/13 0308 11/16/13 0256 11/17/13 0322 11/18/13 0435  NA 135*  < > 134* 127* 127* 128* 126*  K 2.7*  < > 3.6* 3.8 3.6* 3.6* 4.0  CL 82*  < > 81* 81* 79* 82* 81*  CO2 42*  < > 44* 39* 37* 37* 35*  GLUCOSE 88  < > 94 106* 106* 152* 87  BUN 19  < > 20 22 16 15 14   CREATININE 0.63  < > 0.73 0.68 0.56 0.59 0.60  CALCIUM 8.1*  < > 8.3* 8.4 8.3* 8.4 8.2*  MG 1.6  --   --   --   --   --   --   < > = values in this interval not displayed. Liver Function Tests:  Recent Labs Lab 11/14/13 0410 11/17/13 0322  AST 28 21  ALT 24 19  ALKPHOS 68 70  BILITOT 0.4 0.6  PROT 6.5 6.9  ALBUMIN 2.5* 2.6*   CBC:  Recent Labs Lab 11/14/13 0410 11/15/13 0308 11/16/13 0256 11/17/13 0322 11/18/13 0435  WBC 6.7 7.6 8.7 8.4 9.2  NEUTROABS  --   --   --  6.0 5.9  HGB 10.5* 10.5* 10.7* 10.9* 10.5*  HCT 32.2* 32.2* 32.3* 32.6* 31.8*  MCV 94.4 94.4 90.7 92.9 90.6  PLT 473* 485* 557* 491* 427*   Cardiac Enzymes: No results found for this basename: CKTOTAL, CKMB, CKMBINDEX, TROPONINI,  in the last 168 hours CBG: No results found for this basename: GLUCAP,  in the last 168 hours  Recent Results (from the past 240 hour(s))  MRSA PCR SCREENING     Status: None   Collection Time    11/09/13 10:14 AM      Result Value Ref Range Status   MRSA by PCR NEGATIVE  NEGATIVE Final   Comment:            The GeneXpert MRSA Assay (FDA     approved for NASAL specimens     only), is one component of a     comprehensive  MRSA colonization     surveillance program. It is not     intended to diagnose MRSA     infection nor to guide or     monitor treatment for     MRSA infections.     Scheduled Meds: . amiodarone  400 mg Oral BID  . apixaban  5 mg Oral BID  .  metoprolol tartrate  37.5 mg Oral BID  . potassium chloride  20 mEq Oral BID  . sertraline  12.5 mg Oral Daily   Continuous Infusions:

## 2013-11-18 NOTE — Progress Notes (Signed)
Subjective: Slept well.   Objective: Vital signs in last 24 hours: Temp:  [97.5 F (36.4 C)-98.2 F (36.8 C)] 97.5 F (36.4 C) (10/01 0806) Pulse Rate:  [56-113] 107 (10/01 0806) Resp:  [16-35] 34 (10/01 0806) BP: (108-121)/(63-82) 121/72 mmHg (10/01 0806) SpO2:  [85 %-98 %] 96 % (10/01 0806) Last BM Date: 11/17/13  Intake/Output from previous day: 09/30 0701 - 10/01 0700 In: -  Out: 750 [Urine:750] Intake/Output this shift: Total I/O In: -  Out: 150 [Urine:150]  Medications Current Facility-Administered Medications  Medication Dose Route Frequency Provider Last Rate Last Dose  . amiodarone (PACERONE) tablet 400 mg  400 mg Oral BID Peter M Martinique, MD   400 mg at 11/18/13 1023  . apixaban (ELIQUIS) tablet 5 mg  5 mg Oral BID Hosie Poisson, MD   5 mg at 11/18/13 1023  . guaiFENesin-dextromethorphan (ROBITUSSIN DM) 100-10 MG/5ML syrup 10 mL  10 mL Oral Q6H PRN Hosie Poisson, MD      . levalbuterol (XOPENEX) nebulizer solution 0.63 mg  0.63 mg Nebulization Q4H PRN Hosie Poisson, MD   0.63 mg at 11/10/13 2159  . metoprolol tartrate (LOPRESSOR) tablet 37.5 mg  37.5 mg Oral BID Peter M Martinique, MD   37.5 mg at 11/18/13 1023  . nitroGLYCERIN (NITROSTAT) SL tablet 0.4 mg  0.4 mg Sublingual Q5 min PRN Everlene Balls, MD   0.4 mg at 11/09/13 0653  . potassium chloride SA (K-DUR,KLOR-CON) CR tablet 20 mEq  20 mEq Oral BID Nita Sells, MD   20 mEq at 11/18/13 1023  . sertraline (ZOLOFT) tablet 12.5 mg  12.5 mg Oral Daily Nita Sells, MD        PE: General appearance: alert, cooperative and no distress Lungs: Bilateral rales and dcreased BS on the right.  Heart: irregularly irregular rhythm Abdomen: +BS, Nontender Extremities: 2+ right LEE.  Trace on the left Pulses: 2+ and symmetric Skin: Warm and dry Neurologic: Grossly normal  Lab Results:   Recent Labs  11/16/13 0256 11/17/13 0322 11/18/13 0435  WBC 8.7 8.4 9.2  HGB 10.7* 10.9* 10.5*  HCT 32.3* 32.6* 31.8*   PLT 557* 491* 427*   BMET  Recent Labs  11/16/13 0256 11/17/13 0322 11/18/13 0435  NA 127* 128* 126*  K 3.6* 3.6* 4.0  CL 79* 82* 81*  CO2 37* 37* 35*  GLUCOSE 106* 152* 87  BUN 16 15 14   CREATININE 0.56 0.59 0.60  CALCIUM 8.3* 8.4 8.2*   PT/INR  Recent Labs  11/17/13 0322  LABPROT 21.4*  INR 1.86*   Cholesterol No results found for this basename: CHOL,  in the last 72 hours Cardiac Enzymes No components found with this basename: TROPONIN,  CKMB,   Lipid Panel     Component Value Date/Time   CHOL 191 09/25/2013 0321   TRIG 48 09/25/2013 0321   HDL 86 09/25/2013 0321   CHOLHDL 2.2 09/25/2013 0321   VLDL 10 09/25/2013 0321   LDLCALC 95 09/25/2013 0321      Assessment/Plan  1. Acute respiratory failure with hypoxia  2. Atrial fibrillation with rapid ventricular response  - SP TEE with indications that there is thrombus in the AP. On eliquis, lopressor 37.5 BID and Amio 400mg  BID. HR stable in the 80-100. Consider DCCV in 30 days.   3. Acute diastolic heart failure  - Unilateral edema on the right(cellulitis).  Net fluids:  -0.8L/-10.2L.   On IV lasix 40mg  BID. Scr stable. Wt yesterday was 192. Down form  203-207.  I think her CXr yesterday looks worse than 9/23.  We need to be more aggressive with the lasix.  Will change back to 80mg  BID which she had the great response to.  40mg  now.   4. HTN (hypertension)   Stable 5. Hyperlipidemia   Low TG and high HDL.  Does not need statin. 6. HCAP (healthcare-associated pneumonia)  - SP vanc and cefepime  7. Lung nodule  8. Hypokalemia   WNL today. On supplements 9. Cellulitis, Right LE. LE duplex negative for DVT   LOS: 9 days    Saed Hudlow PA-C 11/18/2013 11:41 AM

## 2013-11-19 LAB — BASIC METABOLIC PANEL
Anion gap: 12 (ref 5–15)
BUN: 15 mg/dL (ref 6–23)
CALCIUM: 8.2 mg/dL — AB (ref 8.4–10.5)
CO2: 34 mEq/L — ABNORMAL HIGH (ref 19–32)
CREATININE: 0.62 mg/dL (ref 0.50–1.10)
Chloride: 81 mEq/L — ABNORMAL LOW (ref 96–112)
GFR calc Af Amer: >90 mL/min (ref >90)
GFR, EST NON AFRICAN AMERICAN: 79 mL/min — AB (ref >90)
GLUCOSE: 93 mg/dL (ref 70–99)
Potassium: 4 mEq/L (ref 3.7–5.3)
Sodium: 127 mEq/L — ABNORMAL LOW (ref 137–147)

## 2013-11-19 LAB — CBC WITH DIFFERENTIAL/PLATELET
BASOS ABS: 0 10*3/uL (ref 0.0–0.1)
BASOS PCT: 0 % (ref 0–1)
EOS PCT: 2 % (ref 0–5)
Eosinophils Absolute: 0.2 10*3/uL (ref 0.0–0.7)
HCT: 31 % — ABNORMAL LOW (ref 36.0–46.0)
Hemoglobin: 10.9 g/dL — ABNORMAL LOW (ref 12.0–15.0)
LYMPHS ABS: 2.5 10*3/uL (ref 0.7–4.0)
Lymphocytes Relative: 24 % (ref 12–46)
MCH: 31.5 pg (ref 26.0–34.0)
MCHC: 35.2 g/dL (ref 30.0–36.0)
MCV: 89.6 fL (ref 78.0–100.0)
Monocytes Absolute: 1.1 10*3/uL — ABNORMAL HIGH (ref 0.1–1.0)
Monocytes Relative: 11 % (ref 3–12)
Neutro Abs: 6.4 10*3/uL (ref 1.7–7.7)
Neutrophils Relative %: 63 % (ref 43–77)
PLATELETS: 442 10*3/uL — AB (ref 150–400)
RBC: 3.46 MIL/uL — ABNORMAL LOW (ref 3.87–5.11)
RDW: 14 % (ref 11.5–15.5)
WBC: 10.2 10*3/uL (ref 4.0–10.5)

## 2013-11-19 MED ORDER — METOPROLOL TARTRATE 50 MG PO TABS
50.0000 mg | ORAL_TABLET | Freq: Two times a day (BID) | ORAL | Status: DC
  Administered 2013-11-20 – 2013-11-22 (×6): 50 mg via ORAL
  Filled 2013-11-19 (×8): qty 1

## 2013-11-19 NOTE — Clinical Social Work Note (Signed)
CSW spoke with physician who anticipates a Monday discharge.  CSW informed [facility] concerning new discharge disposition.  CSW will continue to follow.  Domenica Reamer, [place] Social Worker [phone]

## 2013-11-19 NOTE — Progress Notes (Signed)
Patient Name: Julie Haas Date of Encounter: 11/19/2013   Principal Problem:   Acute respiratory failure with hypoxia Active Problems:   Atrial fibrillation with rapid ventricular response   Acute diastolic heart failure   HCAP (healthcare-associated pneumonia)   Respiratory distress   HTN (hypertension)   Hyperlipidemia   Lung nodule   Hypokalemia   Depression    SUBJECTIVE  No chest pain or palpitations. Breathing improving.    CURRENT MEDS . amiodarone  400 mg Oral BID  . apixaban  5 mg Oral BID  . furosemide  80 mg Intravenous BID  . metoprolol tartrate  37.5 mg Oral BID  . potassium chloride  40 mEq Oral BID  . sertraline  12.5 mg Oral Daily    OBJECTIVE  Filed Vitals:   11/19/13 0200 11/19/13 0415 11/19/13 0800 11/19/13 1005  BP: 109/78 122/70 129/48 129/94  Pulse: 100 113 108 127  Temp:  97.7 F (36.5 C) 98.1 F (36.7 C)   TempSrc:  Oral Oral   Resp: 17 28 28    Height:      Weight:  195 lb 15.8 oz (88.9 kg)    SpO2: 94% 93% 93%     Intake/Output Summary (Last 24 hours) at 11/19/13 1130 Last data filed at 11/19/13 1013  Gross per 24 hour  Intake    720 ml  Output   2050 ml  Net  -1330 ml   Filed Weights   11/14/13 1051 11/17/13 0500 11/19/13 0415  Weight: 192 lb 12.8 oz (87.454 kg) 194 lb 7.1 oz (88.2 kg) 195 lb 15.8 oz (88.9 kg)    PHYSICAL EXAM  General: Pleasant, NAD. Neuro: Alert and oriented X 3. Moves all extremities spontaneously. Psych: Normal affect. HEENT:  Normal  Neck: Supple without bruits.  JVP to jaw. Lungs:  Resp regular and unlabored, coarse breath sounds throughout with bibasilar crackles. Heart: IR, IR, distant, no s3, s4, or murmurs. Abdomen: Semi-firm, protuberant, non-tender, BS + x 4.  Extremities: No clubbing, cyanosis.  1+ R LE edema. DP/PT/Radials 1+ and equal bilaterally.  Accessory Clinical Findings  CBC  Recent Labs  11/18/13 0435 11/19/13 0253  WBC 9.2 10.2  NEUTROABS 5.9 6.4  HGB 10.5* 10.9*    HCT 31.8* 31.0*  MCV 90.6 89.6  PLT 427* 409*   Basic Metabolic Panel  Recent Labs  11/18/13 0435 11/19/13 0253  NA 126* 127*  K 4.0 4.0  CL 81* 81*  CO2 35* 34*  GLUCOSE 87 93  BUN 14 15  CREATININE 0.60 0.62  CALCIUM 8.2* 8.2*   Liver Function Tests  Recent Labs  11/17/13 0322  AST 21  ALT 19  ALKPHOS 70  BILITOT 0.6  PROT 6.9  ALBUMIN 2.6*   TELE  Afib, 80's to 1-teens.  Radiology/Studies  Dg Chest 2 View  11/17/2013   CLINICAL DATA:  Congestive heart failure, shortness of breath.  EXAM: CHEST  2 VIEW IMPRESSION: Mild bilateral pleural effusions are noted with left greater than right. Stable bilateral lung opacities are noted consistent with pneumonia or edema, with right worse than left.   Electronically Signed   By: Sabino Dick M.D.   On: 11/17/2013 08:23   ASSESSMENT AND PLAN  1.  Acute respiratory failure:  Multifactorial in the setting of volume overload and HCAP.  See below.   2.  Afib with RVR/Left atrial appendage thrombus:  Remains in afib on amio/bb/eliquis with plan for DCCV @ the end of October (TEE 9/28).  HR's remain in low 100's to 1-teens.  Will push bb to 50mg  bid.  Cont Amio.  3.  Acute on chronic diastolic CHF:  In setting of afib rvr.  -11 L this admission.  Wt has been variably recorded though appears to be trending up since 9/27.  She still has evidence of volume overload on exam.  Cont IV diuresis.  Titrate bb as above.  Cont IV diuresis.  4. HCAP:  Afebrile.  WBC high-normal.  Off of abx.  5. HTN:  Stable.  6.  Hypokalemia:  Stable on oral potassium.  7.  Normocytic Anemia:  Stable.  Signed, Murray Hodgkins NP

## 2013-11-19 NOTE — Progress Notes (Signed)
Patient ID: Julie Haas, female   DOB: 10/03/1925, 81 y.o.   MRN: [id number]  [facility] PROGRESS NOTE  Julie Haas UUV:[id number] DOB: 07-26-1925 DOA: 11/09/2013 PCP: Estill Dooms, MD  Brief narrative: 80 yo ? [place] resident, h/o  Htn, hld, mild AoS multiple admissions 9/2, 9/22 presented with respiratory failure requiring BiPAP due to acute diastolic HF, atrial fib with RVR and possible HCAP. Low blood pressures have made cardizem gtt problematic. Transitioned to amiodarone drip. Cardiology asissting with management c regards to Afib and decompensated Diastolic HF She became progressively hyponatremic and Nephrology was consulted informally for opinion and recommended continuation of diuresis, discontinuation of SSRIs as SIADH picture and monitoring  Assessment/Plan:  Acute respiratory failure with hypoxia:  - secondary to acute on chronic diastolic CHF with ? underlying PNA  - cough is resolving, completed treatment with Levaquin PO 9/26 - currently on O2 via [place] 2-4 L, maintaining oxygen saturations Possible HCAP  - was on Vancomycin and Cefepime, completed 4 days treatment - now on Levaquin PO (started 9/25), completed 3 days treatment  Atrial fibrillation  - S/P TEE CV on 10/01/13 but this did not hold.  - HR in 100's over the past 24 hours  - continue Metoprolol 37.5 twice a day, Apixaban 5 mg twice a day, Amiodarone 400 mg PO BID.  Cannot uptitrate BB 2/2 to hypotension - DCCV aborted as TEE showed potential thrombus - appreciate cardiology assistance  Acute on chronic diastolic heart failure:  - continue Lasix  80 by mouth twice a day, weight is trending down: - 11.4 liters so far this admit - Weights 197 lbs --> 192 lbs --> 194--> 195 pounds[unclear if actual accurate]  - lasix change from IV 40 mg BID-->80 mg IV twice a day Hyponatremia - possibly from Sertraline-discussed with neprhology, recommends to hold this as might have SIADH from this-will need to  slowly be tapered from sertraline and will discontinue on 10/3 -Urine osmolality 324, urine sodium 67 indicating possible SIADH -diet to regular with regular amount of salt -free water restrict 1200 cc HTN  - reasonable inpatient control  -Continue metoprolol 37.5 twice a day Lung nodule - needs repeat CAT scan as an outpatient once euvolemic  Hypokalemia  - from lasix, supplemented PO Kdur increased to 40 from 20 meq BID 9/30 Anemia of chronic disease - Hg remains stable, no signs of active bleeding  - repeat CBC in AM Deconditioning - PT evaluation completed, SNF recommended   Code Status: DNR  Family Communication: none present at bedside however called to discuss plan of care on 10/2 Disposition Plan: SNF when stable   DVT prophylaxis  Apixaban  IV Access:   Peripheral IV Procedures and diagnostic studies:    No results found.  Medical Consultants:   Cardiology Other Consultants:   Physical therapy -recommend skilled nursing care Anti-Infectives:    Vancomycin, Cefepime 9/22 - 9/25   Levaquin 9/25 - 9/28  Verneita Griffes, MD [facility]) [phone]  11/19/2013, 7:44 AM   LOS: 10 days   HPI/Subjective:  Doing fair "bored"   no nausea vomiting no shortness of breath   hasn't passed stool recently No chest pain No blurred or double vision   Objective: Filed Vitals:   11/18/13 2333 11/19/13 0000 11/19/13 0200 11/19/13 0415  BP: 128/91 130/77 109/78 122/70  Pulse: 103 88 100 113  Temp: 97.9 F (36.6 C)   97.7 F (36.5 C)  TempSrc: Oral   Oral  Resp:  32 19 17 28   Height:      Weight:    88.9 kg (195 lb 15.8 oz)  SpO2: 92% 98% 94% 93%    Intake/Output Summary (Last 24 hours) at 11/19/13 0744 Last data filed at 11/19/13 0600  Gross per 24 hour  Intake    960 ml  Output   2200 ml  Net  -1240 ml    Exam:   General:  Pt is alert, follows commands appropriately, not in acute distress  Cardiovascular: Irregular rate and rhythm, no murmurs,  no rubs, no gallops  Respiratory: crackles bilaterally.   mild wheeze left posterior chest   Abdomen: Soft, non tender, non distended, bowel sounds present, no guarding  Extremities:  grade 2 lower extremity edema pitting    Data Reviewed: Basic Metabolic Panel:  Recent Labs Lab 11/15/13 0308 11/16/13 0256 11/17/13 0322 11/18/13 0435 11/19/13 0253  NA 127* 127* 128* 126* 127*  K 3.8 3.6* 3.6* 4.0 4.0  CL 81* 79* 82* 81* 81*  CO2 39* 37* 37* 35* 34*  GLUCOSE 106* 106* 152* 87 93  BUN 22 16 15 14 15   CREATININE 0.68 0.56 0.59 0.60 0.62  CALCIUM 8.4 8.3* 8.4 8.2* 8.2*   Liver Function Tests:  Recent Labs Lab 11/14/13 0410 11/17/13 0322  AST 28 21  ALT 24 19  ALKPHOS 68 70  BILITOT 0.4 0.6  PROT 6.5 6.9  ALBUMIN 2.5* 2.6*   CBC:  Recent Labs Lab 11/15/13 0308 11/16/13 0256 11/17/13 0322 11/18/13 0435 11/19/13 0253  WBC 7.6 8.7 8.4 9.2 10.2  NEUTROABS  --   --  6.0 5.9 6.4  HGB 10.5* 10.7* 10.9* 10.5* 10.9*  HCT 32.2* 32.3* 32.6* 31.8* 31.0*  MCV 94.4 90.7 92.9 90.6 89.6  PLT 485* 557* 491* 427* 442*   Cardiac Enzymes: No results found for this basename: CKTOTAL, CKMB, CKMBINDEX, TROPONINI,  in the last 168 hours CBG: No results found for this basename: GLUCAP,  in the last 168 hours  Recent Results (from the past 240 hour(s))  MRSA PCR SCREENING     Status: None   Collection Time    11/09/13 10:14 AM      Result Value Ref Range Status   MRSA by PCR NEGATIVE  NEGATIVE Final   Comment:            The GeneXpert MRSA Assay (FDA     approved for NASAL specimens     only), is one component of a     comprehensive MRSA colonization     surveillance program. It is not     intended to diagnose MRSA     infection nor to guide or     monitor treatment for     MRSA infections.     Scheduled Meds: . amiodarone  400 mg Oral BID  . apixaban  5 mg Oral BID  . furosemide  80 mg Intravenous BID  . metoprolol tartrate  37.5 mg Oral BID  . potassium  chloride  40 mEq Oral BID  . sertraline  12.5 mg Oral Daily   Continuous Infusions:

## 2013-11-19 NOTE — Progress Notes (Signed)
I have personally seen and examined patient and agree with note as outlined by Ignacia Bayley.  Continues to be volume overloaded so will continue with Lasix 80mg  IV BID.  Agree with increasing BB for better rate control.  Plan for DCCV in 30 days due to LAA thrombus.  Continue Apixiban and Amio.

## 2013-11-19 NOTE — Progress Notes (Signed)
Pt transferred from [facility] to floor.  A&0x3.  Denies pain/discomfort.  Oriented to surrounding.  Call bell at reach.  Instructed to call for assistance as needed.  Verbalized understanding.  Will continue to monitor   Karie Kirks, RN.

## 2013-11-20 DIAGNOSIS — I509 Heart failure, unspecified: Secondary | ICD-10-CM

## 2013-11-20 DIAGNOSIS — J189 Pneumonia, unspecified organism: Secondary | ICD-10-CM

## 2013-11-20 DIAGNOSIS — I350 Nonrheumatic aortic (valve) stenosis: Secondary | ICD-10-CM

## 2013-11-20 LAB — BASIC METABOLIC PANEL
Anion gap: 7 (ref 5–15)
BUN: 17 mg/dL (ref 6–23)
CALCIUM: 8.9 mg/dL (ref 8.4–10.5)
CO2: 36 mEq/L — ABNORMAL HIGH (ref 19–32)
Chloride: 94 mEq/L — ABNORMAL LOW (ref 96–112)
Creatinine, Ser: 0.58 mg/dL (ref 0.50–1.10)
GFR calc Af Amer: >90 mL/min (ref >90)
GFR, EST NON AFRICAN AMERICAN: 81 mL/min — AB (ref >90)
GLUCOSE: 102 mg/dL — AB (ref 70–99)
Potassium: 4.7 mEq/L (ref 3.7–5.3)
SODIUM: 137 meq/L (ref 137–147)

## 2013-11-20 NOTE — Progress Notes (Signed)
Patient ID: Julie Haas, female   DOB: 1925/03/02, 80 y.o.   MRN: [id number]  [facility] PROGRESS NOTE  RUTHELL FEIGENBAUM UXL:[id number] DOB: 1925/07/20 DOA: 11/09/2013 PCP: Estill Dooms, MD  Brief narrative: 80 yo ? [place] resident, h/o  Htn, hld, mild AoS multiple admissions 9/2, 9/22 presented with respiratory failure requiring BiPAP due to acute diastolic HF, atrial fib with RVR and possible HCAP. Low blood pressures have made cardizem gtt problematic. Transitioned to amiodarone drip. Cardiology asissting with management c regards to Afib and decompensated Diastolic HF She became progressively hyponatremic and Nephrology was consulted informally for opinion and recommended continuation of diuresis, discontinuation of SSRIs as SIADH picture and monitoring  Acute respiratory failure with hypoxia:  - secondary to acute on chronic diastolic CHF with ? underlying PNA - cough is resolving, completed treatment with Levaquin PO 9/26 - currently on O2 via [place] 2-4 L, maintaining oxygen saturations Possible HCAP  - was on Vancomycin and Cefepime, completed 4 days treatment - now on Levaquin PO (started 9/25), completed 3 days treatment  Atrial fibrillation  - S/P TEE CV on 10/01/13 but this did not hold.  - HR in 100's over the past 24 hours  - continue Metoprolol 37.5-->50 bid on 11/20/13 twice a day, Apixaban 5 mg twice a day, Amiodarone 400 mg PO BID.  C - DCCV aborted as TEE showed potential thrombus - appreciate cardiology assistance   Acute on chronic diastolic heart failure:  - continue Lasix  80 by mouth twice a day, weight is trending down: - 11.4 liters so far this admit - Weights 197 lbs --> 192 lbs --> 194--> 195-->191 pounds - lasix change from IV 40 mg BID-->80 mg IV twice a day -still has peripheral edema.  Continue IV diuresis and maybe transition to PO in the next 24-48 hrs Hyponatremia - possibly from Sertraline-discussed with neprhology, recommends to hold this as  might have SIADH from this-will need to slowly be tapered from sertraline  -Urine osmolality 324, urine sodium 67 indicating possible SIADH -diet to regular with regular amount of salt -free water restrict 1200 cc -sodium 127-->137 HTN  - reasonable inpatient control  -Continue metoprolol 50 twice a day Lung nodule - needs repeat CAT scan as an outpatient once euvolemic  Hypokalemia  - from lasix, supplemented PO Kdur increased to 40 from 20 meq BID 9/30 Anemia of chronic disease - Hg remains stable, no signs of active bleeding  - repeat CBC in AM Deconditioning - PT evaluation completed, SNF recommended   Code Status: DNR  Family Communication:  Disposition Plan: SNF when stable   DVT prophylaxis  Apixaban  IV Access:   Peripheral IV Procedures and diagnostic studies:    No results found.  Medical Consultants:   Cardiology Other Consultants:   Physical therapy -recommend skilled nursing care Anti-Infectives:    Vancomycin, Cefepime 9/22 - 9/25   Levaquin 9/25 - 9/28  Verneita Griffes, MD [facility] (P) [phone]  11/20/2013, 10:12 AM   LOS: 11 days   HPI/Subjective:  Doing well No issues now. Complained of dizzyness yesterday.  None now No cp   Objective: Filed Vitals:   11/19/13 2059 11/20/13 0017 11/20/13 0500 11/20/13 0816  BP: 127/56 111/74 96/53 104/62  Pulse: 97 105 89 98  Temp: 98.6 F (37 C) 98 F (36.7 C) 98.9 F (37.2 C)   TempSrc: Oral Oral Oral   Resp: 20 20 20    Height:      Weight:  87 kg (191 lb 12.8 oz)   SpO2: 95% 95% 94%     Intake/Output Summary (Last 24 hours) at 11/20/13 1012 Last data filed at 11/20/13 0900  Gross per 24 hour  Intake   1020 ml  Output   1125 ml  Net   -105 ml    Exam:   General:  Pt is alert, follows commands appropriately, not in acute distress  Cardiovascular: Irregular rate and rhythm, no murmurs, no rubs, no gallops  Respiratory: crackles bilaterally.   mild wheeze left posterior chest    Abdomen: Soft, non tender, non distended, bowel sounds present, no guarding  Extremities:  grade 2 lower extremity edema pitting    Data Reviewed: Basic Metabolic Panel:  Recent Labs Lab 11/16/13 0256 11/17/13 0322 11/18/13 0435 11/19/13 0253 11/20/13 0338  NA 127* 128* 126* 127* 137  K 3.6* 3.6* 4.0 4.0 4.7  CL 79* 82* 81* 81* 94*  CO2 37* 37* 35* 34* 36*  GLUCOSE 106* 152* 87 93 102*  BUN 16 15 14 15 17   CREATININE 0.56 0.59 0.60 0.62 0.58  CALCIUM 8.3* 8.4 8.2* 8.2* 8.9   Liver Function Tests:  Recent Labs Lab 11/14/13 0410 11/17/13 0322  AST 28 21  ALT 24 19  ALKPHOS 68 70  BILITOT 0.4 0.6  PROT 6.5 6.9  ALBUMIN 2.5* 2.6*   CBC:  Recent Labs Lab 11/15/13 0308 11/16/13 0256 11/17/13 0322 11/18/13 0435 11/19/13 0253  WBC 7.6 8.7 8.4 9.2 10.2  NEUTROABS  --   --  6.0 5.9 6.4  HGB 10.5* 10.7* 10.9* 10.5* 10.9*  HCT 32.2* 32.3* 32.6* 31.8* 31.0*  MCV 94.4 90.7 92.9 90.6 89.6  PLT 485* 557* 491* 427* 442*   Cardiac Enzymes: No results found for this basename: CKTOTAL, CKMB, CKMBINDEX, TROPONINI,  in the last 168 hours CBG: No results found for this basename: GLUCAP,  in the last 168 hours  No results found for this or any previous visit (from the past 240 hour(s)).   Scheduled Meds: . amiodarone  400 mg Oral BID  . apixaban  5 mg Oral BID  . furosemide  80 mg Intravenous BID  . metoprolol tartrate  50 mg Oral BID  . potassium chloride  40 mEq Oral BID  . sertraline  12.5 mg Oral Daily   Continuous Infusions:

## 2013-11-20 NOTE — Progress Notes (Signed)
No biapa in room.  Old Ed bipap order. Rt will continue to monitor.

## 2013-11-20 NOTE — Progress Notes (Signed)
Patient Name: Julie Haas Date of Encounter: 11/20/2013   Principal Problem:   Acute respiratory failure with hypoxia Active Problems:   HTN (hypertension)   Hyperlipidemia   Atrial fibrillation with rapid ventricular response   Acute diastolic heart failure   HCAP (healthcare-associated pneumonia)   Lung nodule   Hypokalemia   Depression   Respiratory distress    SUBJECTIVE  No chest pain or palpitations. Breathing improving.  Less net negative recorded overnight (total -11.8L). Weight continues to come down (now 191 from 193lb yesterday). Creatinine stable.  CURRENT MEDS . amiodarone  400 mg Oral BID  . apixaban  5 mg Oral BID  . furosemide  80 mg Intravenous BID  . metoprolol tartrate  50 mg Oral BID  . potassium chloride  40 mEq Oral BID  . sertraline  12.5 mg Oral Daily    OBJECTIVE  Filed Vitals:   11/19/13 2059 11/20/13 0017 11/20/13 0500 11/20/13 0816  BP: 127/56 111/74 96/53 104/62  Pulse: 97 105 89 98  Temp: 98.6 F (37 C) 98 F (36.7 C) 98.9 F (37.2 C)   TempSrc: Oral Oral Oral   Resp: 20 20 20    Height:      Weight:   191 lb 12.8 oz (87 kg)   SpO2: 95% 95% 94%     Intake/Output Summary (Last 24 hours) at 11/20/13 1105 Last data filed at 11/20/13 0900  Gross per 24 hour  Intake    780 ml  Output   1125 ml  Net   -345 ml   Filed Weights   11/19/13 0415 11/19/13 1301 11/20/13 0500  Weight: 195 lb 15.8 oz (88.9 kg) 193 lb 12.6 oz (87.9 kg) 191 lb 12.8 oz (87 kg)    PHYSICAL EXAM  General: Pleasant, NAD. Neuro: Alert and oriented X 3. Moves all extremities spontaneously. Psych: Normal affect. HEENT:  Normal  Neck: Supple without bruits.  JVP to jaw. Lungs:  Resp regular and unlabored, coarse breath sounds throughout, no crackles. Heart: IR, IR, distant, no s3, s4, or murmurs. Abdomen: Semi-firm, protuberant, non-tender, BS + x 4.  Extremities: No clubbing, cyanosis.  Trace LE edema. DP/PT/Radials 1+ and equal  bilaterally.  Accessory Clinical Findings  CBC  Recent Labs  11/18/13 0435 11/19/13 0253  WBC 9.2 10.2  NEUTROABS 5.9 6.4  HGB 10.5* 10.9*  HCT 31.8* 31.0*  MCV 90.6 89.6  PLT 427* 242*   Basic Metabolic Panel  Recent Labs  11/19/13 0253 11/20/13 0338  NA 127* 137  K 4.0 4.7  CL 81* 94*  CO2 34* 36*  GLUCOSE 93 102*  BUN 15 17  CREATININE 0.62 0.58  CALCIUM 8.2* 8.9   Liver Function Tests No results found for this basename: AST, ALT, ALKPHOS, BILITOT, PROT, ALBUMIN,  in the last 72 hours TELE  Afib, 80's to 1-teens.  Radiology/Studies  Dg Chest 2 View  11/17/2013   CLINICAL DATA:  Congestive heart failure, shortness of breath.  EXAM: CHEST  2 VIEW IMPRESSION: Mild bilateral pleural effusions are noted with left greater than right. Stable bilateral lung opacities are noted consistent with pneumonia or edema, with right worse than left.   Electronically Signed   By: Sabino Dick M.D.   On: 11/17/2013 08:23   ASSESSMENT AND PLAN  1.  Acute respiratory failure:  Multifactorial in the setting of volume overload and HCAP.  See below.   2.  Afib with RVR/Left atrial appendage thrombus:  Remains in afib on amio/bb/eliquis with  plan for DCCV @ the end of October (TEE 9/28).  HR's remain in low 100's to 1-teens.  Continue bb at 50mg  bid.  Cont Amio.  3.  Acute on chronic diastolic CHF:  In setting of afib rvr.  -11 L this admission.  Wt has been variably recorded though appears to be trending up since 9/27.  She still has evidence of volume overload on exam.  Cont IV diuresis - 80 mg BID today, may switch to po lasix tomorrow.   4. HCAP:  Afebrile.  WBC high-normal.  Off of abx.  5. HTN:  Stable.  6.  Hypokalemia:  Stable on oral potassium.  7.  Normocytic Anemia:  Stable.  Pixie Casino, MD, [facility] Attending Cardiologist [facility]   HILTY,Kenneth C

## 2013-11-21 DIAGNOSIS — J9601 Acute respiratory failure with hypoxia: Principal | ICD-10-CM

## 2013-11-21 LAB — BASIC METABOLIC PANEL
Anion gap: 10 (ref 5–15)
BUN: 18 mg/dL (ref 6–23)
CO2: 35 mEq/L — ABNORMAL HIGH (ref 19–32)
Calcium: 8.2 mg/dL — ABNORMAL LOW (ref 8.4–10.5)
Chloride: 86 mEq/L — ABNORMAL LOW (ref 96–112)
Creatinine, Ser: 0.7 mg/dL (ref 0.50–1.10)
GFR calc Af Amer: 88 mL/min — ABNORMAL LOW (ref >90)
GFR, EST NON AFRICAN AMERICAN: 76 mL/min — AB (ref >90)
GLUCOSE: 86 mg/dL (ref 70–99)
Potassium: 4.3 mEq/L (ref 3.7–5.3)
SODIUM: 131 meq/L — AB (ref 137–147)

## 2013-11-21 MED ORDER — FUROSEMIDE 80 MG PO TABS
80.0000 mg | ORAL_TABLET | Freq: Two times a day (BID) | ORAL | Status: DC
  Administered 2013-11-21 – 2013-11-22 (×2): 80 mg via ORAL
  Filled 2013-11-21 (×4): qty 1

## 2013-11-21 NOTE — Progress Notes (Signed)
Patient Name: Julie Haas Date of Encounter: 11/21/2013   Principal Problem:   Acute respiratory failure with hypoxia Active Problems:   HTN (hypertension)   Hyperlipidemia   Atrial fibrillation with rapid ventricular response   Acute diastolic heart failure   HCAP (healthcare-associated pneumonia)   Lung nodule   Hypokalemia   Depression   Respiratory distress    SUBJECTIVE  No chest pain or palpitations. Breathing improving.  About net even with fluids. Weight is down another 1 lb since yesterday.  CURRENT MEDS . amiodarone  400 mg Oral BID  . apixaban  5 mg Oral BID  . furosemide  80 mg Intravenous BID  . metoprolol tartrate  50 mg Oral BID  . potassium chloride  40 mEq Oral BID  . sertraline  12.5 mg Oral Daily    OBJECTIVE  Filed Vitals:   11/21/13 0200 11/21/13 0655 11/21/13 0756 11/21/13 0900  BP: 104/55 103/63 102/76   Pulse: 99 79 97   Temp:  98 F (36.7 C)    TempSrc:  Oral    Resp:  18    Height:      Weight:    190 lb 6.4 oz (86.365 kg)  SpO2: 95% 93%      Intake/Output Summary (Last 24 hours) at 11/21/13 1114 Last data filed at 11/21/13 0300  Gross per 24 hour  Intake    480 ml  Output    475 ml  Net      5 ml   Filed Weights   11/19/13 1301 11/20/13 0500 11/21/13 0900  Weight: 193 lb 12.6 oz (87.9 kg) 191 lb 12.8 oz (87 kg) 190 lb 6.4 oz (86.365 kg)    PHYSICAL EXAM  General: Pleasant, NAD. Neuro: Alert and oriented X 3. Moves all extremities spontaneously. Psych: Normal affect. HEENT:  Normal  Neck: Supple without bruits.  JVP to jaw. Lungs:  Resp regular and unlabored, coarse breath sounds throughout, no crackles. Heart: IR, IR, distant, no s3, s4, or murmurs. Abdomen: Semi-firm, protuberant, non-tender, BS + x 4.  Extremities: No clubbing, cyanosis.  Trace LE edema. DP/PT/Radials 1+ and equal bilaterally.  Accessory Clinical Findings  CBC  Recent Labs  11/19/13 0253  WBC 10.2  NEUTROABS 6.4  HGB 10.9*  HCT 31.0*    MCV 89.6  PLT 599*   Basic Metabolic Panel  Recent Labs  11/20/13 0338 11/21/13 0328  NA 137 131*  K 4.7 4.3  CL 94* 86*  CO2 36* 35*  GLUCOSE 102* 86  BUN 17 18  CREATININE 0.58 0.70  CALCIUM 8.9 8.2*   Liver Function Tests No results found for this basename: AST, ALT, ALKPHOS, BILITOT, PROT, ALBUMIN,  in the last 72 hours TELE  Afib, 80's and 90's  Radiology/Studies  Dg Chest 2 View  11/17/2013   CLINICAL DATA:  Congestive heart failure, shortness of breath.  EXAM: CHEST  2 VIEW IMPRESSION: Mild bilateral pleural effusions are noted with left greater than right. Stable bilateral lung opacities are noted consistent with pneumonia or edema, with right worse than left.   Electronically Signed   By: Sabino Dick M.D.   On: 11/17/2013 08:23   ASSESSMENT AND PLAN  1.  Acute respiratory failure:  Multifactorial in the setting of volume overload and HCAP.  See below.   2.  Afib with RVR/Left atrial appendage thrombus:  Remains in afib on amio/bb/eliquis with plan for DCCV @ the end of October (TEE 9/28).  HR's in 80's and  90's.  Continue bb at 50mg  bid.  Cont Amio.  3.  Acute on chronic diastolic CHF:  In setting of afib rvr.  -11 L this admission.  Wt has been variably recorded though appears to be trending up since 9/27.  She still has evidence of volume overload on exam.  Change to po lasix today.   4. HCAP:  Afebrile.  WBC high-normal.  Off of abx.  5. HTN:  Stable.  6.  Hypokalemia:  Stable on oral potassium.  7.  Normocytic Anemia:  Stable.  Pixie Casino, MD, [facility] Attending Cardiologist [facility]   Davinder Haff C

## 2013-11-21 NOTE — Progress Notes (Signed)
Patient ID: Julie Haas, female   DOB: 1925-04-17, 80 y.o.   MRN: [id number]  [facility] PROGRESS NOTE  TAUNA MACFARLANE [id number] DOB: [birth year] DOA: 11/09/2013 PCP: Estill Dooms, MD  Brief narrative: 80 yo ? [place] resident, h/o  Htn, hld, mild AoS multiple admissions 9/2, 9/22 presented with respiratory failure requiring BiPAP due to acute diastolic HF, atrial fib with RVR and possible HCAP. Low blood pressures have made cardizem gtt problematic. Transitioned to amiodarone drip. Cardiology asissting with management c regards to Afib and decompensated Diastolic HF She became progressively hyponatremic and Nephrology was consulted informally for opinion and recommended continuation of diuresis, discontinuation of SSRIs as SIADH picture and monitoring  Acute respiratory failure with hypoxia:  - secondary to acute on chronic diastolic CHF with ? underlying PNA - cough is resolving, completed treatment with Levaquin PO 9/26 - currently on O2 via [place] 2-4 L, maintaining oxygen saturations Possible HCAP  - was on Vancomycin and Cefepime, completed 4 days treatment - now on Levaquin PO (started 9/25), completed 3 days treatment  Atrial fibrillation  - S/P TEE CV on 10/01/13 but this did not hold.  - HR in 100's over the past 24 hours  - continue Metoprolol 37.5-->50 bid on 11/20/13 twice a day, Apixaban 5 mg twice a day, Amiodarone 400 mg PO BID.  C - DCCV aborted as TEE showed potential thrombus - appreciate cardiology assistance   Acute on chronic diastolic heart failure:  - continue Lasix  80 by mouth twice a day, weight is trending down: - 11.4 liters so far this admit - Weights 197 lbs --> 192 lbs --> 194--> 195-->191 pounds - lasix change from IV 40 mg BID-->80 mg IV twice a day -still has peripheral edema.  IV diuresis transitioned to by mouth 80 milligrams Lasix twice a day Hyponatremia - possibly from Sertraline-discussed with neprhology, recommends to hold this as might  have SIADH from this-will need to slowly be tapered from sertraline  -Urine osmolality 324, urine sodium 67 indicating possible SIADH -diet to regular with regular amount of salt -free water restrict 1200 cc -sodium 127-->137-->131 HTN  - reasonable inpatient control  -Continue metoprolol 50 twice a day Lung nodule - needs repeat CAT scan as an outpatient once euvolemic  Hypokalemia  - from lasix, supplemented PO Kdur increased to 40 from 20 meq BID 9/30 Anemia of chronic disease - Hg remains stable, no signs of active bleeding  - repeat CBC in AM Deconditioning - PT evaluation completed, SNF recommended   Code Status: DNR  Family Communication:  Disposition Plan: SNF when stable   DVT prophylaxis  Apixaban  IV Access:   Peripheral IV Procedures and diagnostic studies:    No results found.  Medical Consultants:   Cardiology Other Consultants:   Physical therapy -recommend skilled nursing care Anti-Infectives:    Vancomycin, Cefepime 9/22 - 9/25   Levaquin 9/25 - 9/28  Verneita Griffes, MD [facility] ([phone]  11/21/2013, 12:32 PM   LOS: 12 days   HPI/Subjective:  Doing well Eating and drinking at bedside denies chest pain Ready to go Feels less swollen  Objective: Filed Vitals:   11/21/13 0200 11/21/13 0655 11/21/13 0756 11/21/13 0900  BP: 104/55 103/63 102/76   Pulse: 99 79 97   Temp:  98 F (36.7 C)    TempSrc:  Oral    Resp:  18    Height:      Weight:    86.365 kg (190 lb  6.4 oz)  SpO2: 95% 93%      Intake/Output Summary (Last 24 hours) at 11/21/13 1232 Last data filed at 11/21/13 0300  Gross per 24 hour  Intake    480 ml  Output    475 ml  Net      5 ml    Exam:   General:  Pt is alert, follows commands appropriately, not in acute distress  Cardiovascular: Irregular rate and rhythm, no murmurs, no rubs, no gallops  Respiratory: crackles bilaterally.   mild wheeze left posterior chest   Abdomen: Soft, non tender, non  distended, bowel sounds present, no guarding  Extremities:  grade 1 lower extremity edema pitting    Data Reviewed: Basic Metabolic Panel:  Recent Labs Lab 11/17/13 0322 11/18/13 0435 11/19/13 0253 11/20/13 0338 11/21/13 0328  NA 128* 126* 127* 137 131*  K 3.6* 4.0 4.0 4.7 4.3  CL 82* 81* 81* 94* 86*  CO2 37* 35* 34* 36* 35*  GLUCOSE 152* 87 93 102* 86  BUN 15 14 15 17 18   CREATININE 0.59 0.60 0.62 0.58 0.70  CALCIUM 8.4 8.2* 8.2* 8.9 8.2*   Liver Function Tests:  Recent Labs Lab 11/17/13 0322  AST 21  ALT 19  ALKPHOS 70  BILITOT 0.6  PROT 6.9  ALBUMIN 2.6*   CBC:  Recent Labs Lab 11/15/13 0308 11/16/13 0256 11/17/13 0322 11/18/13 0435 11/19/13 0253  WBC 7.6 8.7 8.4 9.2 10.2  NEUTROABS  --   --  6.0 5.9 6.4  HGB 10.5* 10.7* 10.9* 10.5* 10.9*  HCT 32.2* 32.3* 32.6* 31.8* 31.0*  MCV 94.4 90.7 92.9 90.6 89.6  PLT 485* 557* 491* 427* 442*   Cardiac Enzymes: No results found for this basename: CKTOTAL, CKMB, CKMBINDEX, TROPONINI,  in the last 168 hours CBG: No results found for this basename: GLUCAP,  in the last 168 hours  No results found for this or any previous visit (from the past 240 hour(s)).   Scheduled Meds: . amiodarone  400 mg Oral BID  . apixaban  5 mg Oral BID  . furosemide  80 mg Oral BID  . metoprolol tartrate  50 mg Oral BID  . potassium chloride  40 mEq Oral BID  . sertraline  12.5 mg Oral Daily   Continuous Infusions:

## 2013-11-22 ENCOUNTER — Telehealth: Payer: Self-pay | Admitting: Cardiology

## 2013-11-22 LAB — BASIC METABOLIC PANEL
Anion gap: 8 (ref 5–15)
BUN: 20 mg/dL (ref 6–23)
CO2: 36 mEq/L — ABNORMAL HIGH (ref 19–32)
Calcium: 8.2 mg/dL — ABNORMAL LOW (ref 8.4–10.5)
Chloride: 90 mEq/L — ABNORMAL LOW (ref 96–112)
Creatinine, Ser: 0.69 mg/dL (ref 0.50–1.10)
GFR calc Af Amer: 89 mL/min — ABNORMAL LOW (ref >90)
GFR, EST NON AFRICAN AMERICAN: 77 mL/min — AB (ref >90)
GLUCOSE: 92 mg/dL (ref 70–99)
Potassium: 4.4 mEq/L (ref 3.7–5.3)
SODIUM: 134 meq/L — AB (ref 137–147)

## 2013-11-22 MED ORDER — AMIODARONE HCL 400 MG PO TABS: 400.0000 mg | ORAL_TABLET | Freq: Two times a day (BID) | ORAL | Status: DC

## 2013-11-22 MED ORDER — SERTRALINE HCL 25 MG PO TABS: 12.5000 mg | ORAL_TABLET | Freq: Every day | ORAL | Status: DC

## 2013-11-22 MED ORDER — METOPROLOL TARTRATE 50 MG PO TABS: 50.0000 mg | ORAL_TABLET | Freq: Two times a day (BID) | ORAL | Status: DC

## 2013-11-22 NOTE — Progress Notes (Signed)
SATURATION QUALIFICATIONS: (This note is used to comply with regulatory documentation for home oxygen)  Patient Saturations on Room Air at Rest = 45%  Patient Saturations on 3 Liters of oxygen while Resting = 93%  Please briefly explain why patient needs home oxygen:

## 2013-11-22 NOTE — Telephone Encounter (Signed)
Closed enounter °

## 2013-11-22 NOTE — Progress Notes (Signed)
OK per MD for d/c today back to [facility]. Patient is aware and very anxious to return to facility.  Notified Janie at [facility]- bed is available. Message left for patient's son E.T. Oletta Lamas by Aldona Bar [place], SW Intern of patient's d/c.  Nursing notified to call report.  EMS arranged for transport and d/c summary sent to facility.  No further CSW needs identified.  CSW signing off.  Lorie Phenix. Pauline Good, [place]

## 2013-11-22 NOTE — Progress Notes (Signed)
Patient is discharged back to [facility]. Report called to facility, patient is on 3L O2, no complaints of pain. DC IV, DC Tele.

## 2013-11-22 NOTE — Discharge Summary (Addendum)
Physician Discharge Summary  Julie Haas WGY:[id number] DOB: 1925-09-19 DOA: 11/09/2013  PCP: Estill Dooms, MD  Admit date: 11/09/2013 Discharge date: 11/22/2013  Time spent: 35 minutes  Recommendations for Outpatient Follow-up:  1. Need basic metabolic panel as well as CBC in about one week 2.  Consider down titration of amiodarone to 400 mg daily on 11/29/13 3. Monitor daily weights-if gains more than 2 pounds over a 24-hour period of time would add Lasix extra dose 4. Note that her sertraline has been cut back at it was thought she had SIADH during hospital stay 5. Potassium discontinued as tested normal on discharge 6. [facility] 7. Repeat CT chest in 6 months as has a lung nodule will need cardiology followup for potential DC CV cardioversion as well as echocardiogram that she had a thrombus-suggest followup in about one week c Dr. Percival Spanish   Discharge Diagnoses:  Principal Problem:   Acute respiratory failure with hypoxia Active Problems:   HTN (hypertension)   Hyperlipidemia   Atrial fibrillation with rapid ventricular response   Acute diastolic heart failure   HCAP (healthcare-associated pneumonia)   Lung nodule   Hypokalemia   Depression   Respiratory distress   Discharge Condition: Fair  Diet recommendation: Heart healthy--would fluid restrict for the next 3-4 days 1200 cc and then increase to regular amount of fluid in diet  Filed Weights   11/20/13 0500 11/21/13 0900 11/22/13 0528  Weight: 87 kg (191 lb 12.8 oz) 86.365 kg (190 lb 6.4 oz) 86.11 kg (189 lb 13.4 oz)    History of present illness:  80 yo ? [place] resident, h/o Htn, hld, mild AoS multiple admissions 9/2, 9/22 presented with respiratory failure requiring BiPAP due to acute diastolic HF, atrial fib with RVR and possible HCAP. Low blood pressures have made cardizem gtt problematic. Transitioned to amiodarone drip.  Cardiology asissting with management c  regards to Afib and decompensated Diastolic HF  She became progressively hyponatremic and Nephrology was consulted informally for opinion and recommended continuation of diuresis, discontinuation of SSRIs as SIADH picture and monitoring   Hospital Course:  Acute respiratory failure with hypoxia:  - secondary to acute on chronic diastolic CHF with ? underlying PNA  - cough is resolving, completed treatment with Levaquin PO 9/26  - currently on O2 via [facility] 2-4 L-this will need to be reevaluated as patient did not really have any oxygen requirements towards end of hospital stay Possible HCAP  - was on Vancomycin and Cefepime, completed 4 days treatment  - now on Levaquin PO (started 9/25), completed 3 days treatment  Atrial fibrillation  - S/P TEE CV on 10/01/13 but this did not hold.  - HR in 100's over the past 24 hours  - continue Metoprolol 37.5-->50 bid on 11/20/13 twice a day, Apixaban 5 mg twice a day, Amiodarone 400 mg PO BID. - DCCV aborted as TEE showed potential thrombus  - appreciate cardiology assistance  -Will need outpatient followup and management Acute on chronic diastolic heart failure:  - continue Lasix  80 by mouth twice a day, weight is trending down: - 11.4 liters so far this admit  - Weights 197 lbs --> 192 lbs --> 194--> 195-->191-->189 pounds On discharge - lasix change from IV 40 mg BID-->80 mg IV twice a day  - IV diuresis transitioned to by mouth 80 milligrams Lasix twice a day And will be discharged on this dose  Hyponatremia  - possibly from  Sertraline-discussed with neprhology, recommends to hold this as might have SIADH from this-will need to slowly be tapered from sertraline  -Urine osmolality 324, urine sodium 67 indicating possible SIADH  -diet to regular with regular amount of salt  -free water restrict 1200 cc  -sodium 127-->137-->131-->  134 and discharge  -Please recheck basic metabolic panel in about one week  HTN  - reasonable inpatient control   -Continue metoprolol 50 twice a day  Lung nodule  - needs repeat CAT scan as an outpatient once euvolemic  Hypokalemia  - from lasix, supplemented PO Kdur increased to 40 from 20 meq BID 9/30  -None discharged on potassium as did not need but may need to be rechecked  Anemia of chronic disease  - Hg remains stable, no signs of active bleeding  - repeat CBC in  one-week     Discharge Exam: Filed Vitals:   11/22/13 0528  BP: 98/56  Pulse: 89  Temp: 98.1 F (36.7 C)  Resp: 18    General: Alert pleasant oriented no apparent distress overnight  Did well  Cardiovascular: S1-S2 no murmur rub or gallop atrial fibrillation  Respiratory: Clinically has some milder crackles in lower lung field Grade 1 lower extremity edema  Discharge Instructions You were cared for by a hospitalist during your hospital stay. If you have any questions about your discharge medications or the care you received while you were in the hospital after you are discharged, you can call the unit and asked to speak with the hospitalist on call if the hospitalist that took care of you is not available. Once you are discharged, your primary care physician will handle any further medical issues. Please note that NO REFILLS for any discharge medications will be authorized once you are discharged, as it is imperative that you return to your primary care physician (or establish a relationship with a primary care physician if you do not have one) for your aftercare needs so that they can reassess your need for medications and monitor your lab values.  Discharge Instructions   Diet - low sodium heart healthy    Complete by:  As directed      Increase activity slowly    Complete by:  As directed           Current Discharge Medication List    START taking these medications   Details  amiodarone (PACERONE) 400 MG tablet Take 1 tablet (400 mg total) by mouth 2 (two) times daily. Qty: 60 tablet, Refills: 0       CONTINUE these medications which have CHANGED   Details  metoprolol (LOPRESSOR) 50 MG tablet Take 1 tablet (50 mg total) by mouth 2 (two) times daily. Qty: 60 tablet, Refills: 0    sertraline (ZOLOFT) 25 MG tablet Take 0.5 tablets (12.5 mg total) by mouth daily. Qty: 30 tablet, Refills: 0      CONTINUE these medications which have NOT CHANGED   Details  albuterol (PROVENTIL) (2.5 MG/3ML) 0.083% nebulizer solution Take 2.5 mg by nebulization every 6 (six) hours as needed for wheezing or shortness of breath.    apixaban (ELIQUIS) 5 MG TABS tablet Take 1 tablet (5 mg total) by mouth 2 (two) times daily. Qty: 60 tablet, Refills: 11    furosemide (LASIX) 80 MG tablet Take 80 mg by mouth 2 (two) times daily.    guaiFENesin-dextromethorphan (ROBITUSSIN DM) 100-10 MG/5ML syrup Take 10 mLs by mouth every 6 (six) hours as needed for cough.  levalbuterol (XOPENEX) 0.63 MG/3ML nebulizer solution Take 3 mLs (0.63 mg total) by nebulization every 4 (four) hours as needed for wheezing or shortness of breath. Qty: 3 mL, Refills: 12    vitamin E 1000 UNIT capsule Take 1,000 Units by mouth daily.      STOP taking these medications     diltiazem (CARDIZEM CD) 240 MG 24 hr capsule      potassium chloride SA (K-DUR,KLOR-CON) 20 MEQ tablet      azithromycin (ZITHROMAX) 500 MG tablet      OXYGEN      predniSONE (DELTASONE) 5 MG tablet        Allergies  Allergen Reactions  . Codeine     Unknown  . Cucumber Extract Nausea And Vomiting  . Diuretic [Buchu-[place]sil[place]-Ch Grass-Hydran]     Unknown  . Lipitor [Atorvastatin Calcium]     Unknown  . Penicillins Other (See Comments)    unknown  . Pravachol     Unknown      The results of significant diagnostics from this hospitalization (including imaging, microbiology, ancillary and laboratory) are listed below for reference.    Significant Diagnostic Studies: Dg Chest 2 View  11/17/2013   CLINICAL DATA:  Congestive heart failure,  shortness of breath.  EXAM: CHEST  2 VIEW  COMPARISON:  November 10, 2013.  FINDINGS: Stable cardiomegaly. Stable bilateral lung opacities are noted, with right worse than left. This is concerning for pneumonia or edema. Bilateral pleural effusions are noted with left greater than right. No pneumothorax is noted.  IMPRESSION: Mild bilateral pleural effusions are noted with left greater than right. Stable bilateral lung opacities are noted consistent with pneumonia or edema, with right worse than left.   Electronically Signed   By: Sabino Dick M.D.   On: 11/17/2013 08:23   Dg Chest 2 View  10/29/2013   CLINICAL DATA:  Followup pneumonia, chronic shortness of breath  EXAM: CHEST  2 VIEW  COMPARISON:  Prior chest x-ray 10/20/2013 ; prior CT scan of the chest 10/11/2013  FINDINGS: Slight interval worsening of patchy areas of interstitial and airspace opacity predominantly throughout the right lung with relative sparing of the left lung compared to 10/20/2013. Bilateral pleural effusions have slightly enlarged in her now associated with bibasilar atelectasis. Stable cardiomegaly. Atherosclerotic calcification present with in the transverse aorta. No acute osseous abnormality.  IMPRESSION: 1. Slight interval progression of multifocal irregular patchy interstitial and airspace opacities predominantly throughout the right lung compared to 10/20/2013. The interval change may reflect slightly increased pulmonary vascular congestion/mild edema superimposed on the underlying presumed pneumonia. Alternately, this could represent progressive pleural parenchymal scarring. A neoplastic process is considered significantly less likely given the relatively normal appearance of the lung fields on 09/24/2013. 2. Stable cardiomegaly. 3. Slightly enlarged bilateral layering pleural effusions with associated bibasilar atelectasis.   Electronically Signed   By: Jacqulynn Cadet M.D.   On: 10/29/2013 15:23   Dg Chest Port 1  View  11/10/2013   CLINICAL DATA:  Pulmonary edema.  EXAM: PORTABLE CHEST - 1 VIEW  COMPARISON:  11/09/2013.  09/24/2013.  FINDINGS: Mediastinum and hilar structures are unremarkable. Stable cardiomegaly. Persistent diffuse pulmonary infiltrates again noted. Persistent small pleural effusions. Cardiomegaly unchanged. These findings are consistent with persistent congestive heart failure pulmonary edema. Bilateral multifocal pneumonia cannot be excluded. ARDS cannot be excluded.  IMPRESSION: Persistent cardiomegaly and unchanged bilateral pulmonary alveolar infiltrates and small pleural effusions consistent with congestive heart failure and pulmonary edema. Bilateral pneumonia and/or ARDS cannot  be excluded.   Electronically Signed   By: Marcello Moores  Register   On: 11/10/2013 07:56   Dg Chest Port 1 View  11/09/2013   CLINICAL DATA:  Shortness of breath  EXAM: PORTABLE CHEST - 1 VIEW  COMPARISON:  Prior radiograph from 10/29/2013  FINDINGS: Cardiomegaly is grossly stable relative to prior exam. Mediastinal silhouette within normal limits. Atherosclerotic calcifications present within the aortic arch.  Lungs are mildly hypoinflated. Small bilateral pleural effusions are present, left larger than right. There are widespread multi focal parenchymal patchy opacities throughout both lungs, right worse than left. Diffuse interstitial thickening with pulmonary vascular congestion is present. Altogether, these findings are favored to reflect diffuse pulmonary edema, although superimposed infection is not entirely excluded. Bibasilar atelectasis is present.  No pneumothorax.  No acute osseus abnormality.  IMPRESSION: 1. Extensive multi focal irregular patchy airspace opacities throughout the bilateral lungs, right greater than left, worsened as compared to most recent radiograph from 10/29/2013. Findings are favored to reflect moderate diffuse pulmonary edema, although superimposed infection/pneumonia could also be considered  in the correct clinical setting. 2. Bilateral pleural effusions, left greater than right, with associated bibasilar atelectasis. 3. Stable cardiomegaly.   Electronically Signed   By: Jeannine Boga M.D.   On: 11/09/2013 07:00    Microbiology: No results found for this or any previous visit (from the past 240 hour(s)).   Labs: Basic Metabolic Panel:  Recent Labs Lab 11/18/13 0435 11/19/13 0253 11/20/13 0338 11/21/13 0328 11/22/13 0306  NA 126* 127* 137 131* 134*  K 4.0 4.0 4.7 4.3 4.4  CL 81* 81* 94* 86* 90*  CO2 35* 34* 36* 35* 36*  GLUCOSE 87 93 102* 86 92  BUN 14 15 17 18 20   CREATININE 0.60 0.62 0.58 0.70 0.69  CALCIUM 8.2* 8.2* 8.9 8.2* 8.2*   Liver Function Tests:  Recent Labs Lab 11/17/13 0322  AST 21  ALT 19  ALKPHOS 70  BILITOT 0.6  PROT 6.9  ALBUMIN 2.6*   No results found for this basename: LIPASE, AMYLASE,  in the last 168 hours No results found for this basename: AMMONIA,  in the last 168 hours CBC:  Recent Labs Lab 11/16/13 0256 11/17/13 0322 11/18/13 0435 11/19/13 0253  WBC 8.7 8.4 9.2 10.2  NEUTROABS  --  6.0 5.9 6.4  HGB 10.7* 10.9* 10.5* 10.9*  HCT 32.3* 32.6* 31.8* 31.0*  MCV 90.7 92.9 90.6 89.6  PLT 557* 491* 427* 442*   Cardiac Enzymes: No results found for this basename: CKTOTAL, CKMB, CKMBINDEX, TROPONINI,  in the last 168 hours BNP: BNP (last 3 results)  Recent Labs  10/29/13 1619 11/09/13 0658 11/17/13 0322  PROBNP 444.0* 1801.0* 1747.0*   CBG: No results found for this basename: GLUCAP,  in the last 168 hours     Signed:  Nita Sells  [facility] 11/22/2013, 7:39 AM

## 2013-11-22 NOTE — Progress Notes (Signed)
Physical Therapy Treatment Patient Details Name: Julie Haas MRN: [id number] DOB: Mar 17, 1925 Today's Date: 11/22/2013    History of Present Illness pt presents with HCAP, developed afib, TEE showed large thrombus in Lt atrium, on Eliquis. PMHx-  afib with RVR and cardioversion 11/01/13; cardiomegaly, depression    PT Comments    Pt progressing well though upon arrival, pt without O2 and O2 sats 50%, pt asymptomatic. RN notified, 3L O2 applied and O2 sats up to 93%. With mobility, O2 sats up to 95% on 3L O2. Discussed purchase of home O2 sat monitor with pt and son, especially since she was asymptomatic. Pt ambulated 8' fwd and 8' bkwd with RW and one LOB with mod A to correct. Plan to d/c to SNF today for continued therapy. PT signing off.  Follow Up Recommendations  SNF;Supervision/Assistance - 24 hour     Equipment Recommendations  None recommended by PT    Recommendations for Other Services       Precautions / Restrictions Precautions Precautions: Fall Restrictions Weight Bearing Restrictions: No    Mobility  Bed Mobility Overal bed mobility: Needs Assistance Bed Mobility: Supine to Sit     Supine to sit: Min assist     General bed mobility comments: required min A to get hips to EOB  Transfers Overall transfer level: Needs assistance Equipment used: Rolling walker (2 wheeled) Transfers: Sit to/from [facility] Sit to Stand: Min assist Stand pivot transfers: Min assist       General transfer comment: min A to steady as pt has difficulty with fwd wt-shifting, tends to rock back twds heels and lose balance  Ambulation/Gait Ambulation/Gait assistance: Min assist;Mod assist Ambulation Distance (Feet): 16 Feet Assistive device: Rolling walker (2 wheeled) Gait Pattern/deviations: Step-through pattern;Decreased step length - right;Decreased step length - left;Trunk flexed Gait velocity: decreased    General Gait Details: ambulated fwd 8' and  bkwd 8', pt lost balance stepping bkwds with mod A to correct, otherwise min A needed to steady. O2 sats remained 95% on 3L O2.    Stairs            Wheelchair Mobility    Modified Rankin (Stroke Patients Only)       Balance Overall balance assessment: Needs assistance Sitting-balance support: Bilateral upper extremity supported;Feet supported Sitting balance-Leahy Scale: Fair   Postural control: Posterior lean Standing balance support: Bilateral upper extremity supported;During functional activity Standing balance-Leahy Scale: Poor Standing balance comment: needs UE support and still tends to lose balance bkwd                    Cognition Arousal/Alertness: Awake/alert Behavior During Therapy: WFL for tasks assessed/performed Overall Cognitive Status: History of cognitive impairments - at baseline       Memory: Decreased short-term memory              Exercises General Exercises - Lower Extremity Ankle Circles/Pumps: AROM;Both;10 reps;Seated Long Arc Quad: AROM;Both;10 reps    General Comments        Pertinent Vitals/Pain Pain Assessment: No/denies pain See assessment above    Home Living                      Prior Function            PT Goals (current goals can now be found in the care plan section) Acute Rehab PT Goals Patient Stated Goal: return [facility] independent living [facility] PT Goal Formulation: With  patient Time For Goal Achievement: 11/28/13 Potential to Achieve Goals: Good Progress towards PT goals: Progressing toward goals    Frequency  Min 2X/week    PT Plan Current plan remains appropriate    Co-evaluation             End of Session Equipment Utilized During Treatment: Oxygen;Gait belt Activity Tolerance: Patient tolerated treatment well Patient left: in chair;with call bell/phone within reach;with family/visitor present     Time: 1131-1205 PT Time Calculation (min): 34 min  Charges:   $Gait Training: 8-22 mins $Therapeutic Activity: 8-22 mins                    G Codes:     Leighton Roach, PT  Acute Rehab Services  [phone]  Leighton Roach 11/22/2013, 12:53 PM

## 2013-11-23 ENCOUNTER — Encounter: Payer: Self-pay | Admitting: Adult Health

## 2013-11-23 ENCOUNTER — Non-Acute Institutional Stay (SKILLED_NURSING_FACILITY): Payer: Medicare Other | Admitting: Adult Health

## 2013-11-23 DIAGNOSIS — I10 Essential (primary) hypertension: Secondary | ICD-10-CM

## 2013-11-23 DIAGNOSIS — F32A Depression, unspecified: Secondary | ICD-10-CM

## 2013-11-23 DIAGNOSIS — F329 Major depressive disorder, single episode, unspecified: Secondary | ICD-10-CM

## 2013-11-23 DIAGNOSIS — E876 Hypokalemia: Secondary | ICD-10-CM

## 2013-11-23 DIAGNOSIS — I5031 Acute diastolic (congestive) heart failure: Secondary | ICD-10-CM

## 2013-11-23 DIAGNOSIS — I4891 Unspecified atrial fibrillation: Secondary | ICD-10-CM

## 2013-11-23 DIAGNOSIS — J9601 Acute respiratory failure with hypoxia: Secondary | ICD-10-CM

## 2013-11-23 NOTE — Progress Notes (Signed)
Patient ID: Julie Haas, female   DOB: 05-19-1925, 80 y.o.   MRN: [id number]     [facility]  Allergies  Allergen Reactions  . Codeine     Unknown  . Cucumber Extract Nausea And Vomiting  . Diuretic [Buchu-Cornsilk-Ch Grass-Hydran]     Unknown  . Lipitor [Atorvastatin Calcium]     Unknown  . Penicillins Other (See Comments)    unknown  . Pravachol     Unknown     Chief Complaint  Patient presents with  . Hospitalization Follow-up    HPI:  She has been hospitalized for  Acute respiratory failure with hypoxemia; afib and diastolic heart failure. She has a possible cardiac thrombus and will need a follow up cardiology for continued monitoring. She is here for short term rehab with her goal to return back to her apartment at this time.    Past Medical History  Diagnosis Date  . Hyperlipidemia   . Arrhythmia     PVC  . Retinal detachment   . Renal disorder   . Allergy   . Occlusion and stenosis of carotid artery without mention of cerebral infarction 03/24/2012  . Pneumonia, organism unspecified 11/192013  . Depressive disorder, not elsewhere classified 03/05/2011  . Other premature beats 03/05/2011  . Other seborrheic keratosis 11/20/2010  . Syncope and collapse 10/02/2010  . Palpitations 11/21/2009  . Unspecified essential hypertension 06/27/2009  . Cardiomegaly 06/27/2009  . Other generalized ischemic cerebrovascular disease 06/27/2009  . Dizziness and giddiness 06/27/2009  . Unspecified vitamin D deficiency 05/16/2009  . Family history of sudden cardiac death (SCD) 12/15/08  . Rash and other nonspecific skin eruption 07/05/2008  . Edema 03/15/2008  . Obesity, unspecified 07/25/2006  . Varicose veins of lower extremities with inflammation 03/16/2003  . Shortness of breath     Past Surgical History  Procedure Laterality Date  . Cholecystectomy  1983  . Cataract extraction Bilateral     Dr. [name]  . Tonsillectomy    . Keratosis    . Retinal detachment  surgery Left   . Cardioversion N/A 10/01/2013    Procedure: CARDIOVERSION;  Surgeon: Sanda Klein, MD;  Location: [facility] ENDOSCOPY;  Service: Cardiovascular;  Laterality: N/A;  . Tee without cardioversion N/A 10/01/2013    Procedure: TRANSESOPHAGEAL ECHOCARDIOGRAM (TEE);  Surgeon: Sanda Klein, MD;  Location: [facility] ENDOSCOPY;  Service: Cardiovascular;  Laterality: N/A;  . Tee without cardioversion N/A 11/15/2013    Procedure: TRANSESOPHAGEAL ECHOCARDIOGRAM (TEE);  Surgeon: Fay Records, MD;  Location: [facility] ENDOSCOPY;  Service: Cardiovascular;  Laterality: N/A;    VITAL SIGNS BP 129/79  Pulse 80  Ht 5\' 2"  (1.575 m)  Wt 189 lb 8 oz (85.957 kg)  BMI 34.65 kg/m2   Patient's Medications  New Prescriptions   No medications on file  Previous Medications   ALBUTEROL (PROVENTIL) (2.5 MG/3ML) 0.083% NEBULIZER SOLUTION    Take 2.5 mg by nebulization every 6 (six) hours as needed for wheezing or shortness of breath.   AMIODARONE (PACERONE) 400 MG TABLET    Take 1 tablet (400 mg total) by mouth 2 (two) times daily.   APIXABAN (ELIQUIS) 5 MG TABS TABLET    Take 1 tablet (5 mg total) by mouth 2 (two) times daily.   FUROSEMIDE (LASIX) 80 MG TABLET    Take 80 mg by mouth 2 (two) times daily.   GUAIFENESIN-DEXTROMETHORPHAN (ROBITUSSIN DM) 100-10 MG/5ML SYRUP    Take 10 mLs by mouth every 6 (six) hours as needed for cough.  LEVALBUTEROL (XOPENEX) 0.63 MG/3ML NEBULIZER SOLUTION    Take 3 mLs (0.63 mg total) by nebulization every 4 (four) hours as needed for wheezing or shortness of breath.   METOPROLOL (LOPRESSOR) 50 MG TABLET    Take 1 tablet (50 mg total) by mouth 2 (two) times daily.   SERTRALINE (ZOLOFT) 25 MG TABLET    Take 0.5 tablets (12.5 mg total) by mouth daily.   VITAMIN E 1000 UNIT CAPSULE    Take 1,000 Units by mouth daily.  Modified Medications   No medications on file  Discontinued Medications   No medications on file    SIGNIFICANT DIAGNOSTIC EXAMS  11-09-13: chest x-ray: 1. Extensive multi  focal irregular patchy airspace opacities throughout the bilateral lungs, right greater than left, worsened as compared to most recent radiograph from 10/29/2013. Findings are favored to reflect moderate diffuse pulmonary edema, although superimposed infection/pneumonia could also be considered in the correct clinical setting. 2. Bilateral pleural effusions, left greater than right, with associated bibasilar atelectasis.3. Stable cardiomegaly.  11-11-13: right lower extremity doppler: No evidence of deep vein thrombosis involving the right lower   extremity. - No evidence of Baker&'s cyst on the right.  11-15-13: TEE: possible cardiac thrombus   11-17-13: chest x-ray: Mild bilateral pleural effusions are noted with left greater than right. Stable bilateral lung opacities are noted consistent with pneumonia or edema, with right worse than left.       LABS REVIEWED  11-09-13: wbc 11.0; hgb 11.1; hct 33.0; mcv 93.8; plt 304; glucose 128; bun 18; creat 0.49; k+3.6; na++133; liver normal albumin 2.9; BNP 1801 11-11-13: glucose 101; bun 25; creat 0.59; k+3.7; na++134 11-13-13: wbc 6.6; hgb 10.9; hct 32.5 ;mcv 91.5; plt 447; glucose 84; bun 18; creat 0.53; k+3.2; na++132; C02 41 11-18-13: wbc 9.2; hgb 10.5; hct 31.8; mcv 90.6; plt 427; glucose 87; bun 14; creat 0.60; k+4.0; na++126; C02 35; urine sodium: 67; urine osmolality: 324 11-22-13: glucose 92; bun 20; creat 0.69; k+4.4; na++134   Review of Systems  Constitutional: Negative for malaise/fatigue.  Respiratory: Positive for shortness of breath. Negative for cough.   Cardiovascular: Negative for chest pain, palpitations and leg swelling.  Gastrointestinal: Negative for heartburn, abdominal pain and constipation.  Musculoskeletal: Negative for joint pain and myalgias.  Skin: Negative.   Psychiatric/Behavioral: Negative for depression. The patient is not nervous/anxious.      Physical Exam  Constitutional: She is oriented to person, place, and  time. She appears well-developed and well-nourished. No distress.  Obese   Neck: Neck supple. No thyromegaly present.  Cardiovascular: Normal rate.   Heart rate irregular Pedal pulses absent Post tib pulses faint   Respiratory: Effort normal and breath sounds normal. No respiratory distress.  Is 02 dependent   GI: Soft. Bowel sounds are normal. She exhibits no distension. There is no tenderness.  Musculoskeletal:  Is able to move all extremities Has trace pedal edema   Neurological: She is alert and oriented to person, place, and time.  Skin: Skin is warm and dry. She is not diaphoretic.  Lower extremities discolored; feet cold to touch       ASSESSMENT/ PLAN:  1. Afib: her heart rate is presently stable; is on amiodarone 400 mg twice daily through 11-29-13 will then begin 400 mg daily.  Will continue lopressor 50 mg twice daily for rate control; will continue eliquis 5 mg twice daily and will monitor   2. Cardiac thrombus: she is to follow up with cardiology. Will continue eliquis 5  mg twice daily and will monitor   3. Diastolic heart failure: she is presently stable will weigh patient daily will have staff call for a 2 pound weight gain in one day or 5 pound gain in one week. Will continue lasix 80 mg twice daily; lopressor 50 mg twice daily will monitor   4. Hypertension: is stable wil continue lopressor 50 mg twice daily   5. Acute respiratory failure with hypoxemia: she is presently requiring the use of 02. Will stop the albuterol neb due to tachycardia will continue her xopenex neb 0.63 mg every 4 hours as needed and will begin atrovent neb every 4 hours as needed and will monitor   6. Depression: she is presently stable; is taking zoloft 12.5 mg daily due to low sodium levels possible SIADH in the hospital will not make changes at this time and will monitor her status.   She will need ct of chest in the next 6 months to follow up on a lung nodule  She is due to have drawn  this week.    Time spent with patient 50 minutes.    Ok Romito NP [facility]  Contact [phone] Monday through Friday 8am- 5pm  After hours call [phone]

## 2013-11-30 ENCOUNTER — Non-Acute Institutional Stay (SKILLED_NURSING_FACILITY): Payer: Medicare Other | Admitting: Nurse Practitioner

## 2013-11-30 DIAGNOSIS — I10 Essential (primary) hypertension: Secondary | ICD-10-CM

## 2013-11-30 DIAGNOSIS — E876 Hypokalemia: Secondary | ICD-10-CM

## 2013-11-30 DIAGNOSIS — I4891 Unspecified atrial fibrillation: Secondary | ICD-10-CM

## 2013-11-30 DIAGNOSIS — I5031 Acute diastolic (congestive) heart failure: Secondary | ICD-10-CM

## 2013-11-30 DIAGNOSIS — F329 Major depressive disorder, single episode, unspecified: Secondary | ICD-10-CM

## 2013-11-30 DIAGNOSIS — D638 Anemia in other chronic diseases classified elsewhere: Secondary | ICD-10-CM

## 2013-11-30 DIAGNOSIS — F32A Depression, unspecified: Secondary | ICD-10-CM

## 2013-11-30 DIAGNOSIS — J9601 Acute respiratory failure with hypoxia: Secondary | ICD-10-CM

## 2013-11-30 LAB — CBC AND DIFFERENTIAL
HCT: 29 % — AB (ref 36–46)
Hemoglobin: 9.7 g/dL — AB (ref 12.0–16.0)
Platelets: 360 10*3/uL (ref 150–399)
WBC: 9.1 10^3/mL

## 2013-11-30 NOTE — Assessment & Plan Note (Addendum)
10/05/13 EKG SNF [facility] Afib-vent rate 100-110.  11/30/13 HR 90s. Takes Amiodarone 400mg  daily and Metoprolol 50mg  bid. Continue Eliquis 5mg  bid.

## 2013-11-30 NOTE — Assessment & Plan Note (Addendum)
11/08/13 Cardiology: cont Lasix 80mg  bid f/u Dr. Telford Nab 3 months. Call weight gain # 3 Ibs/24hr or #5Ibs/week call the office.  11/09/13 ED for O2 desaturation and SOB. Weight gain #4.5Ibs from yesterday.  11/30/13 BNP 489.7. Weights [phone] in the past 4 days. F/u Cardiology. Update BMP

## 2013-11-30 NOTE — Progress Notes (Signed)
Patient ID: Julie Haas, female   DOB: 03-Dec-1925, 80 y.o.   MRN: [id number]    Code Status: DNR, living will.   Allergies  Allergen Reactions  . Codeine     Unknown  . Cucumber Extract Nausea And Vomiting  . Diuretic [Buchu-Cornsilk-Ch Grass-Hydran]     Unknown  . Lipitor [Atorvastatin Calcium]     Unknown  . Penicillins Other (See Comments)    unknown  . Pravachol     Unknown    Chief Complaint  Patient presents with  . Medical Management of Chronic Issues  . Acute Visit    weight gain, SOB, depressive mood.     HPI: Patient is a 80 y.o. female seen in the SNF at [facility] today for evaluation of depression and other chronic medical conditions.     Hospitalized 11/09/13-11/22/13 Acute respiratory failure with hypoxia HTN (hypertension) Hyperlipidemia Atrial fibrillation with rapid ventricular response Acute diastolic heart failure HCAP (healthcare-associated pneumonia) Lung nodule Hypokalemia Depression Respiratory distress    Hospitalized 10/11/2013-10/20/2013 for HCAP (healthcare-associated pneumonia) Atrial fibrillation Acute diastolic heart failure Hypoxemia Acute respiratory failure with hypoxia.  The patient had recent admission to hospital for PNA s/p TEE/DCCV (discharged on 8/15) and presented to [facility] ED with main concern of progressively worsening shortness of breath, mostly exertional but occasionally present at rest. Associated with productive cough of yellow sputum, subjective fevers, chills, LE edema and 2 pillow orthopnea      Hospitalized 09/24/2013-10/02/2013 for Acute diastolic heart failure HTN (hypertension) Hyperlipidemia Wound of left leg(since Nov 2014-f/u [place]) Bilateral moderate carotid disease by doppler Atrial fibrillation-conversion 10/01/13 Mild aortic stenosis with normal LVF Pneumonia(Levaquin 750mg  daily) Acute resp failure     Presented ED with SOB. She had a prior history of PVCs but no confirmed AF per her recollection. She was  noted in AF with rates in the >150 range. She was given diltiazem en route. In the ED, she was given additional diltiazem and placed on an infusion with better control of her HR in the 110s-120s. She was admitted to hospital for AF-RVR and continue on IV dilt. IV heparin added but later changed to Eliquis. She was given 20mg  of IV lasix for volume overload. She ultimately diuresed -14L net. TEE/DCCV was completed on Aug 14. Fortaz and vancomycin started Monday, Aug 10 for PNA. WBC decreased. She was changed to PO Levaquin 750 mg a day x 5 days(dc 10/07/13)   Problem List Items Addressed This Visit   Hypokalemia     Update BMP    HTN (hypertension) (Chronic)     Controlled.     Depression     It was decreased to 12.5mg  while in hospital 11/09/13-11/22/13-c/o less motive since SNF readmitted-will increase Sertraline to 25mg  po daily. Observe the patient.     Atrial fibrillation with rapid ventricular response     10/05/13 EKG SNF [facility] Afib-vent rate 100-110.  11/30/13 HR 90s. Takes Amiodarone 400mg  daily and Metoprolol 50mg  bid. Continue Eliquis 5mg  bid.      Anemia of chronic disease     11/30/13 Hgb 9.7. No active bleed.      Acute respiratory failure with hypoxia     SOB, O2 dependent, afebrile, repeat CXR.     Acute diastolic heart failure - Primary     11/08/13 Cardiology: cont Lasix 80mg  bid f/u Dr. Telford Nab 3 months. Call weight gain # 3 Ibs/24hr or #5Ibs/week call the office.  11/09/13 ED for O2 desaturation and SOB. Weight  gain #4.5Ibs from yesterday.  11/30/13 BNP 489.7. Weights [phone] in the past 4 days. F/u Cardiology. Update BMP        Review of Systems:  Review of Systems  Constitutional: Negative for fever, chills, weight loss, malaise/fatigue and diaphoresis.  HENT: Positive for hearing loss. Negative for congestion, ear discharge, ear pain, nosebleeds, sore throat and tinnitus.   Eyes: Negative for blurred vision, double vision, photophobia, pain, discharge and redness.   Respiratory: Positive for cough. Negative for hemoptysis, sputum production, shortness of breath, wheezing and stridor.        Hacking  Cardiovascular: Positive for leg swelling and PND. Negative for chest pain, palpitations, orthopnea and claudication.       BLE L>R. 2+  Gastrointestinal: Negative for heartburn, nausea, vomiting, abdominal pain, diarrhea, constipation, blood in stool and melena.  Genitourinary: Negative for dysuria, urgency, frequency, hematuria and flank pain.  Musculoskeletal: Negative for back pain, falls, joint pain, myalgias and neck pain.       No further hematuria since Foley Catheter removed.   Skin: Negative for itching and rash.       Left lower leg wound since Nov 2014-f/u [place] weekly.   Neurological: Negative for dizziness, tingling, tremors, sensory change, speech change, focal weakness, seizures, loss of consciousness, weakness and headaches.  Endo/Heme/Allergies: Negative for environmental allergies and polydipsia. Does not bruise/bleed easily.  Psychiatric/Behavioral: Positive for depression. Negative for suicidal ideas, hallucinations, memory loss and substance abuse. The patient is nervous/anxious. The patient does not have insomnia.        Slept better since Furosemide 80mg  bid 10/29/13     Past Medical History  Diagnosis Date  . Hyperlipidemia   . Arrhythmia     PVC  . Retinal detachment   . Renal disorder   . Allergy   . Occlusion and stenosis of carotid artery without mention of cerebral infarction 03/24/2012  . Pneumonia, organism unspecified 11/192013  . Depressive disorder, not elsewhere classified 03/05/2011  . Other premature beats 03/05/2011  . Other seborrheic keratosis 11/20/2010  . Syncope and collapse 10/02/2010  . Palpitations 11/21/2009  . Unspecified essential hypertension 06/27/2009  . Cardiomegaly 06/27/2009  . Other generalized ischemic cerebrovascular disease 06/27/2009  . Dizziness and giddiness 06/27/2009  . Unspecified  vitamin D deficiency 05/16/2009  . Family history of sudden cardiac death (SCD) 12-26-08  . Rash and other nonspecific skin eruption 07/05/2008  . Edema 03/15/2008  . Obesity, unspecified 07/25/2006  . Varicose veins of lower extremities with inflammation 03/16/2003  . Shortness of breath    Past Surgical History  Procedure Laterality Date  . Cholecystectomy  1983  . Cataract extraction Bilateral     Dr. [name]  . Tonsillectomy    . Keratosis    . Retinal detachment surgery Left   . Cardioversion N/A 10/01/2013    Procedure: CARDIOVERSION;  Surgeon: Sanda Klein, MD;  Location: [facility] ENDOSCOPY;  Service: Cardiovascular;  Laterality: N/A;  . Tee without cardioversion N/A 10/01/2013    Procedure: TRANSESOPHAGEAL ECHOCARDIOGRAM (TEE);  Surgeon: Sanda Klein, MD;  Location: [facility] ENDOSCOPY;  Service: Cardiovascular;  Laterality: N/A;  . Tee without cardioversion N/A 11/15/2013    Procedure: TRANSESOPHAGEAL ECHOCARDIOGRAM (TEE);  Surgeon: Fay Records, MD;  Location: [facility] ENDOSCOPY;  Service: Cardiovascular;  Laterality: N/A;   Social History:   reports that she has never smoked. She has never used smokeless tobacco. She reports that she does not drink alcohol or use illicit drugs.  Family History  Problem Relation  Age of Onset  . Arrhythmia      Long QT in first degree relatives,  . Heart attack Father   . Heart disease Father   . Heart attack Brother   . Heart disease Brother   . Pneumonia Mother     Medications: Patient's Medications  New Prescriptions   No medications on file  Previous Medications   ALBUTEROL (PROVENTIL) (2.5 MG/3ML) 0.083% NEBULIZER SOLUTION    Take 2.5 mg by nebulization every 6 (six) hours as needed for wheezing or shortness of breath.   AMIODARONE (PACERONE) 400 MG TABLET    Take 1 tablet (400 mg total) by mouth 2 (two) times daily.   APIXABAN (ELIQUIS) 5 MG TABS TABLET    Take 1 tablet (5 mg total) by mouth 2 (two) times daily.   FUROSEMIDE (LASIX) 80 MG TABLET     Take 80 mg by mouth 2 (two) times daily.   GUAIFENESIN-DEXTROMETHORPHAN (ROBITUSSIN DM) 100-10 MG/5ML SYRUP    Take 10 mLs by mouth every 6 (six) hours as needed for cough.   LEVALBUTEROL (XOPENEX) 0.63 MG/3ML NEBULIZER SOLUTION    Take 3 mLs (0.63 mg total) by nebulization every 4 (four) hours as needed for wheezing or shortness of breath.   METOPROLOL (LOPRESSOR) 50 MG TABLET    Take 1 tablet (50 mg total) by mouth 2 (two) times daily.   SERTRALINE (ZOLOFT) 25 MG TABLET    Take 0.5 tablets (12.5 mg total) by mouth daily.   VITAMIN E 1000 UNIT CAPSULE    Take 1,000 Units by mouth daily.  Modified Medications   No medications on file  Discontinued Medications   No medications on file     Physical Exam: Physical Exam  Constitutional:  overwewight  HENT:  Right Ear: External ear normal.  Left Ear: External ear normal.  Nose: Nose normal.  Eyes:  Corrective lenses.  Neck: Normal range of motion. Neck supple. No JVD present. No tracheal deviation present. No thyromegaly present.  Cardiovascular: Intact distal pulses.  Exam reveals no gallop and no friction rub.   Murmur heard. Varicose leg veins. 2+ edema LLE>RLE. HR 90s irregular. Systolic murmur apex of heart 2-3/6 noted.   Pulmonary/Chest: No respiratory distress. She has no wheezes. She has rales (bronchial rattle).  Moist rales posterior lower lungs.   Abdominal: Soft. Bowel sounds are normal. She exhibits no distension and no mass. There is no tenderness.  Musculoskeletal: Normal range of motion. She exhibits edema. She exhibits no tenderness.  2+edema BLE L>R  Lymphadenopathy:    She has no cervical adenopathy.  Neurological: She is alert. No cranial nerve deficit. Coordination normal.  Skin: No rash noted. No erythema. No pallor.  Open area is about 63mm by 14 mm. Some swelling of the leg above this. Small hematoma at superior end of the injury.  Psychiatric: Her behavior is normal. Judgment and thought content normal.    Depressive and anxious mood.     Filed Vitals:   11/30/13 1519  BP: 115/66  Pulse: 86  Temp: 97.7 F (36.5 C)  TempSrc: Tympanic  Resp: 18      Labs reviewed: Basic Metabolic Panel:  Recent Labs  09/24/13 0834  10/12/13 0404  11/12/13 0520  11/20/13 0338 11/21/13 0328 11/22/13 0306  NA  --   < > 137  < > 135*  < > 137 131* 134*  K  --   < > 3.5*  < > 2.7*  < > 4.7 4.3 4.4  CL  --   < > 95*  < > 82*  < > 94* 86* 90*  CO2  --   < > 27  < > 42*  < > 36* 35* 36*  GLUCOSE  --   < > 92  < > 88  < > 102* 86 92  BUN  --   < > 13  < > 19  < > 17 18 20   CREATININE  --   < > 0.60  < > 0.63  < > 0.58 0.70 0.69  CALCIUM  --   < > 7.9*  < > 8.1*  < > 8.9 8.2* 8.2*  MG  --   --  1.7  --  1.6  --   --   --   --   TSH 2.580  --   --   --   --   --   --   --   --   < > = values in this interval not displayed. Liver Function Tests:  Recent Labs  11/09/13 0658 11/14/13 0410 11/17/13 0322  AST 23 28 21   ALT 18 24 19   ALKPHOS 80 68 70  BILITOT 1.0 0.4 0.6  PROT 7.5 6.5 6.9  ALBUMIN 2.9* 2.5* 2.6*   No results found for this basename: LIPASE, AMYLASE,  in the last 8760 hours No results found for this basename: AMMONIA,  in the last 8760 hours CBC:  Recent Labs  11/17/13 0322 11/18/13 0435 11/19/13 0253 11/30/13  WBC 8.4 9.2 10.2 9.1  NEUTROABS 6.0 5.9 6.4  --   HGB 10.9* 10.5* 10.9* 9.7*  HCT 32.6* 31.8* 31.0* 29*  MCV 92.9 90.6 89.6  --   PLT 491* 427* 442* 360   Lipid Panel:  Recent Labs  03/22/13 09/25/13 0321  CHOL 229* 191  HDL 63 86  LDLCALC 148 95  TRIG 92 48  CHOLHDL  --  2.2    Past Procedures:  09/30/13 CXR  IMPRESSION: No active cardiopulmonary disease.  10/01/13   Echocardiogram transesophageal                             LV EF: 55% -   60%  10/11/13 CT chest:  IMPRESSION:  Patchy bilateral airspace process with nodularity most prominent  over the right upper lobe likely due to infection. Patchy  bronchiectatic change as described.  Small bilateral pleural  effusions. 1.3 cm pretracheal lymph node likely reactive. Recommend  followup CT in 6-8 weeks to re-evaluate the areas of nodularity.  Cardiomegaly with calcification of the mitral valve annulus and left  atrial enlargement. Atherosclerotic coronary artery disease. Small  amount of pericardial fluid.  1.1 cm liver cyst unchanged.  11/17/13 CXR   IMPRESSION:  Mild bilateral pleural effusions are noted with left greater than  right. Stable bilateral lung opacities are noted consistent with  pneumonia or edema, with right worse than left.      Assessment/Plan Acute diastolic heart failure 08/17/50 Cardiology: cont Lasix 80mg  bid f/u Dr. Telford Nab 3 months. Call weight gain # 3 Ibs/24hr or #5Ibs/week call the office.  11/09/13 ED for O2 desaturation and SOB. Weight gain #4.5Ibs from yesterday.  11/30/13 BNP 489.7. Weights [phone] in the past 4 days. F/u Cardiology. Update BMP   Depression It was decreased to 12.5mg  while in hospital 11/09/13-11/22/13-c/o less motive since SNF readmitted-will increase Sertraline to 25mg  po daily. Observe the patient.   Atrial fibrillation  with rapid ventricular response 10/05/13 EKG SNF [facility] Afib-vent rate 100-110.  11/30/13 HR 90s. Takes Amiodarone 400mg  daily and Metoprolol 50mg  bid. Continue Eliquis 5mg  bid.    Acute respiratory failure with hypoxia SOB, O2 dependent, afebrile, repeat CXR.   Anemia of chronic disease 11/30/13 Hgb 9.7. No active bleed.    HTN (hypertension) Controlled.   Hypokalemia Update BMP    Family/ Staff Communication: observe the patient.   Goals of Care: AL  Labs/tests ordered: BMP, CXR

## 2013-11-30 NOTE — Assessment & Plan Note (Signed)
Controlled.  

## 2013-11-30 NOTE — Assessment & Plan Note (Signed)
SOB, O2 dependent, afebrile, repeat CXR.

## 2013-11-30 NOTE — Assessment & Plan Note (Signed)
11/30/13 Hgb 9.7. No active bleed.

## 2013-11-30 NOTE — Assessment & Plan Note (Signed)
It was decreased to 12.5mg  while in hospital 11/09/13-11/22/13-c/o less motive since SNF readmitted-will increase Sertraline to 25mg  po daily. Observe the patient.

## 2013-11-30 NOTE — Assessment & Plan Note (Signed)
Update BMP.  

## 2013-12-01 ENCOUNTER — Telehealth: Payer: Self-pay | Admitting: Cardiology

## 2013-12-01 LAB — BASIC METABOLIC PANEL
BUN: 15 mg/dL (ref 4–21)
Creatinine: 0.7 mg/dL (ref 0.5–1.1)
GLUCOSE: 83 mg/dL
POTASSIUM: 3.4 mmol/L (ref 3.4–5.3)
Sodium: 137 mmol/L (ref 137–147)

## 2013-12-01 NOTE — Telephone Encounter (Signed)
i agree    thanks

## 2013-12-01 NOTE — Telephone Encounter (Signed)
SPOKE TO [name].  SHE STATES SHE SPOKE TO [place] MS. MAST NP  CONCERNING PATIENT.  WANTED TO KNOW IF DR Lee Mont WANTED TO HANDLE THE ISSUE OR HAVE  MS MAST NP RN INFORMED [name] TO HAVE ( MAST, NP) TO FOLLOW UP WITH PATIENT DR HOCHREIN IS NOT IN  [place] OFFICE TODAY. PATIENT APPOINTMENT IS WITH  LAURA INGOLD NP ON 10 /19/15. [name] VERBALIZED UNDERSTANDING

## 2013-12-01 NOTE — Telephone Encounter (Signed)
[place] with [facility] called states that the pt has had a chest Xray.  Results bilateral pneumonia and she also has an increased weight gain since 10/05... The pt's weight was 189.5 she is presently 196 lbs. Pt is taking lasix, 80 mg twice a day. Will the pt need a sooner appt/Please call back to discuss

## 2013-12-02 LAB — BASIC METABOLIC PANEL
BUN: 14 mg/dL (ref 4–21)
Creatinine: 0.7 mg/dL (ref 0.5–1.1)
Glucose: 90 mg/dL
Potassium: 3 mmol/L — AB (ref 3.4–5.3)
Sodium: 138 mmol/L (ref 137–147)

## 2013-12-03 ENCOUNTER — Non-Acute Institutional Stay (SKILLED_NURSING_FACILITY): Payer: Medicare Other | Admitting: Nurse Practitioner

## 2013-12-03 ENCOUNTER — Encounter: Payer: Self-pay | Admitting: Nurse Practitioner

## 2013-12-03 DIAGNOSIS — F329 Major depressive disorder, single episode, unspecified: Secondary | ICD-10-CM

## 2013-12-03 DIAGNOSIS — I10 Essential (primary) hypertension: Secondary | ICD-10-CM

## 2013-12-03 DIAGNOSIS — I5031 Acute diastolic (congestive) heart failure: Secondary | ICD-10-CM

## 2013-12-03 DIAGNOSIS — I4891 Unspecified atrial fibrillation: Secondary | ICD-10-CM

## 2013-12-03 DIAGNOSIS — J9601 Acute respiratory failure with hypoxia: Secondary | ICD-10-CM

## 2013-12-03 DIAGNOSIS — E876 Hypokalemia: Secondary | ICD-10-CM

## 2013-12-03 DIAGNOSIS — J189 Pneumonia, unspecified organism: Secondary | ICD-10-CM

## 2013-12-03 DIAGNOSIS — F32A Depression, unspecified: Secondary | ICD-10-CM

## 2013-12-03 NOTE — Assessment & Plan Note (Signed)
10/05/13 EKG SNF [facility] Afib-vent rate 100-110.  11/30/13 HR 90s. Takes Amiodarone 400mg  daily and Metoprolol 50mg  bid. Continue Eliquis 5mg  bid.

## 2013-12-03 NOTE — Progress Notes (Signed)
Patient ID: Julie Haas, female   DOB: 1925/07/22, 80 y.o.   MRN: [id number]    Code Status: DNR, living will.   Allergies  Allergen Reactions  . Codeine     Unknown  . Cucumber Extract Nausea And Vomiting  . Diuretic [Buchu-Cornsilk-Ch Grass-Hydran]     Unknown  . Lipitor [Atorvastatin Calcium]     Unknown  . Penicillins Other (See Comments)    unknown  . Pravachol     Unknown    Chief Complaint  Patient presents with  . Medical Management of Chronic Issues  . Acute Visit    penumonia    HPI: Patient is a 80 y.o. female seen in the SNF at [facility] today for evaluation of PNA, hypokalemia,  and other chronic medical conditions.     Hospitalized 11/09/13-11/22/13 Acute respiratory failure with hypoxia HTN (hypertension) Hyperlipidemia Atrial fibrillation with rapid ventricular response Acute diastolic heart failure HCAP (healthcare-associated pneumonia) Lung nodule Hypokalemia Depression Respiratory distress    Hospitalized 10/11/2013-10/20/2013 for HCAP (healthcare-associated pneumonia) Atrial fibrillation Acute diastolic heart failure Hypoxemia Acute respiratory failure with hypoxia.  The patient had recent admission to hospital for PNA s/p TEE/DCCV (discharged on 8/15) and presented to [facility] ED with main concern of progressively worsening shortness of breath, mostly exertional but occasionally present at rest. Associated with productive cough of yellow sputum, subjective fevers, chills, LE edema and 2 pillow orthopnea      Hospitalized 09/24/2013-10/02/2013 for Acute diastolic heart failure HTN (hypertension) Hyperlipidemia Wound of left leg(since Nov 2014-f/u [place]) Bilateral moderate carotid disease by doppler Atrial fibrillation-conversion 10/01/13 Mild aortic stenosis with normal LVF Pneumonia(Levaquin 750mg  daily) Acute resp failure     Presented ED with SOB. She had a prior history of PVCs but no confirmed AF per her recollection. She was noted in AF with  rates in the >150 range. She was given diltiazem en route. In the ED, she was given additional diltiazem and placed on an infusion with better control of her HR in the 110s-120s. She was admitted to hospital for AF-RVR and continue on IV dilt. IV heparin added but later changed to Eliquis. She was given 20mg  of IV lasix for volume overload. She ultimately diuresed -14L net. TEE/DCCV was completed on Aug 14. Fortaz and vancomycin started Monday, Aug 10 for PNA. WBC decreased. She was changed to PO Levaquin 750 mg a day x 5 days(dc 10/07/13)   Problem List Items Addressed This Visit   Hypokalemia     12/01/13 K 3.4 12/02/13 K 3.0 12/03/13 Kcl 26meq daily, update BMP     HTN (hypertension) (Chronic)     Controlled.      HCAP (healthcare-associated pneumonia) - Primary     12/01/13 CXR multiple bilateral pneumonia. Cardiomegaly without other overt congestive heart failure findings.  Avelox 400mg  daily x [redacted] weeks along with FloraStor bid. EKG to monitor QT interval due to drug interaction with Amiodarone.  Elevate extremities  Daily weights  Taper steroids slowly: 40 mg x 3 days, 30 mg x 3 days, 20 mg x 3 days, 10 mg x 3 days, 5 mg x 3 days  PRN O2       Depression     It was decreased to 12.5mg  while in hospital 11/09/13-11/22/13-c/o less motive since SNF readmitted-increased Sertraline to 25mg  po daily 11/30/13-too soon to eval. Observe the patient.      Atrial fibrillation with rapid ventricular response     10/05/13 EKG SNF [facility] Afib-vent rate  100-110.  11/30/13 HR 90s. Takes Amiodarone 400mg  daily and Metoprolol 50mg  bid. Continue Eliquis 5mg  bid.       Acute respiratory failure with hypoxia     SOB, O2 dependent, afebrile. 12/01/13 CXR showed multiple bilateral pneumonia-Avelox 400mg  qd x 2 weeks started 02/58/52    Acute diastolic heart failure     11/08/13 Cardiology: cont Lasix 80mg  bid f/u Dr. Telford Nab 3 months. Call weight gain # 3 Ibs/24hr or #5Ibs/week call the office.  11/09/13  ED for O2 desaturation and SOB. Weight gain #4.5Ibs from yesterday.  11/30/13 BNP 489.7. Weights [phone] in the past 4 days.  12/01/13 CXR cardiomegaly without other overt congestive heart failure.  12/02/13 additional Furosemide 20mg  x1        Review of Systems:  Review of Systems  Constitutional: Negative for fever, chills, weight loss, malaise/fatigue and diaphoresis.  HENT: Positive for hearing loss. Negative for congestion, ear discharge, ear pain, nosebleeds, sore throat and tinnitus.   Eyes: Negative for blurred vision, double vision, photophobia, pain, discharge and redness.  Respiratory: Positive for cough. Negative for hemoptysis, sputum production, shortness of breath, wheezing and stridor.        Hacking  Cardiovascular: Positive for leg swelling and PND. Negative for chest pain, palpitations, orthopnea and claudication.       BLE L>R. 2+  Gastrointestinal: Negative for heartburn, nausea, vomiting, abdominal pain, diarrhea, constipation, blood in stool and melena.  Genitourinary: Negative for dysuria, urgency, frequency, hematuria and flank pain.  Musculoskeletal: Negative for back pain, falls, joint pain, myalgias and neck pain.       No further hematuria since Foley Catheter removed.   Skin: Negative for itching and rash.       Left lower leg wound since Nov 2014-f/u [facility] weekly.   Neurological: Negative for dizziness, tingling, tremors, sensory change, speech change, focal weakness, seizures, loss of consciousness, weakness and headaches.  Endo/Heme/Allergies: Negative for environmental allergies and polydipsia. Does not bruise/bleed easily.  Psychiatric/Behavioral: Positive for depression. Negative for suicidal ideas, hallucinations, memory loss and substance abuse. The patient is nervous/anxious. The patient does not have insomnia.        Slept better since Furosemide 80mg  bid 10/29/13     Past Medical History  Diagnosis Date  . Hyperlipidemia   .  Arrhythmia     PVC  . Retinal detachment   . Renal disorder   . Allergy   . Occlusion and stenosis of carotid artery without mention of cerebral infarction 03/24/2012  . Pneumonia, organism unspecified 11/192013  . Depressive disorder, not elsewhere classified 03/05/2011  . Other premature beats 03/05/2011  . Other seborrheic keratosis 11/20/2010  . Syncope and collapse 10/02/2010  . Palpitations 11/21/2009  . Unspecified essential hypertension 06/27/2009  . Cardiomegaly 06/27/2009  . Other generalized ischemic cerebrovascular disease 06/27/2009  . Dizziness and giddiness 06/27/2009  . Unspecified vitamin D deficiency 05/16/2009  . Family history of sudden cardiac death (SCD) 12-23-08  . Rash and other nonspecific skin eruption 07/05/2008  . Edema 03/15/2008  . Obesity, unspecified 07/25/2006  . Varicose veins of lower extremities with inflammation 03/16/2003  . Shortness of breath    Past Surgical History  Procedure Laterality Date  . Cholecystectomy  1983  . Cataract extraction Bilateral     Dr. [name]  . Tonsillectomy    . Keratosis    . Retinal detachment surgery Left   . Cardioversion N/A 10/01/2013    Procedure: CARDIOVERSION;  Surgeon: Sanda Klein, MD;  Location:  [facility] ENDOSCOPY;  Service: Cardiovascular;  Laterality: N/A;  . Tee without cardioversion N/A 10/01/2013    Procedure: TRANSESOPHAGEAL ECHOCARDIOGRAM (TEE);  Surgeon: Sanda Klein, MD;  Location: [facility] ENDOSCOPY;  Service: Cardiovascular;  Laterality: N/A;  . Tee without cardioversion N/A 11/15/2013    Procedure: TRANSESOPHAGEAL ECHOCARDIOGRAM (TEE);  Surgeon: Fay Records, MD;  Location: [facility] ENDOSCOPY;  Service: Cardiovascular;  Laterality: N/A;   Social History:   reports that she has never smoked. She has never used smokeless tobacco. She reports that she does not drink alcohol or use illicit drugs.  Family History  Problem Relation Age of Onset  . Arrhythmia      Long QT in first degree relatives,  . Heart attack Father    . Heart disease Father   . Heart attack Brother   . Heart disease Brother   . Pneumonia Mother     Medications: Patient's Medications  New Prescriptions   No medications on file  Previous Medications   ALBUTEROL (PROVENTIL) (2.5 MG/3ML) 0.083% NEBULIZER SOLUTION    Take 2.5 mg by nebulization every 6 (six) hours as needed for wheezing or shortness of breath.   AMIODARONE (PACERONE) 400 MG TABLET    Take 1 tablet (400 mg total) by mouth 2 (two) times daily.   APIXABAN (ELIQUIS) 5 MG TABS TABLET    Take 1 tablet (5 mg total) by mouth 2 (two) times daily.   FUROSEMIDE (LASIX) 80 MG TABLET    Take 80 mg by mouth 2 (two) times daily.   GUAIFENESIN-DEXTROMETHORPHAN (ROBITUSSIN DM) 100-10 MG/5ML SYRUP    Take 10 mLs by mouth every 6 (six) hours as needed for cough.   LEVALBUTEROL (XOPENEX) 0.63 MG/3ML NEBULIZER SOLUTION    Take 3 mLs (0.63 mg total) by nebulization every 4 (four) hours as needed for wheezing or shortness of breath.   METOPROLOL (LOPRESSOR) 50 MG TABLET    Take 1 tablet (50 mg total) by mouth 2 (two) times daily.   SERTRALINE (ZOLOFT) 25 MG TABLET    Take 0.5 tablets (12.5 mg total) by mouth daily.   VITAMIN E 1000 UNIT CAPSULE    Take 1,000 Units by mouth daily.  Modified Medications   No medications on file  Discontinued Medications   No medications on file     Physical Exam: Physical Exam  Constitutional:  overwewight  HENT:  Right Ear: External ear normal.  Left Ear: External ear normal.  Nose: Nose normal.  Eyes:  Corrective lenses.  Neck: Normal range of motion. Neck supple. No JVD present. No tracheal deviation present. No thyromegaly present.  Cardiovascular: Intact distal pulses.  Exam reveals no gallop and no friction rub.   Murmur heard. Varicose leg veins. 2+ edema LLE>RLE. HR 90s irregular. Systolic murmur apex of heart 2-3/6 noted.   Pulmonary/Chest: No respiratory distress. She has no wheezes. She has rales (bronchial rattle).  Moist rales  posterior lower lungs.   Abdominal: Soft. Bowel sounds are normal. She exhibits no distension and no mass. There is no tenderness.  Musculoskeletal: Normal range of motion. She exhibits edema. She exhibits no tenderness.  2+edema BLE L>R  Lymphadenopathy:    She has no cervical adenopathy.  Neurological: She is alert. No cranial nerve deficit. Coordination normal.  Skin: No rash noted. No erythema. No pallor.  Open area is about 63mm by 14 mm. Some swelling of the leg above this. Small hematoma at superior end of the injury.  Psychiatric: Her behavior is normal. Judgment and thought content  normal.  Depressive and anxious mood.     Filed Vitals:   12/03/13 1408  BP: 123/91  Pulse: 92  Temp: 97.9 F (36.6 C)  TempSrc: Tympanic  Resp: 20      Labs reviewed: Basic Metabolic Panel:  Recent Labs  09/24/13 0834  10/12/13 0404  11/12/13 0520  11/20/13 0338 11/21/13 0328 11/22/13 0306 12/01/13 12/02/13  NA  --   < > 137  < > 135*  < > 137 131* 134* 137 138  K  --   < > 3.5*  < > 2.7*  < > 4.7 4.3 4.4 3.4 3.0*  CL  --   < > 95*  < > 82*  < > 94* 86* 90*  --   --   CO2  --   < > 27  < > 42*  < > 36* 35* 36*  --   --   GLUCOSE  --   < > 92  < > 88  < > 102* 86 92  --   --   BUN  --   < > 13  < > 19  < > 17 18 20 15 14   CREATININE  --   < > 0.60  < > 0.63  < > 0.58 0.70 0.69 0.7 0.7  CALCIUM  --   < > 7.9*  < > 8.1*  < > 8.9 8.2* 8.2*  --   --   MG  --   --  1.7  --  1.6  --   --   --   --   --   --   TSH 2.580  --   --   --   --   --   --   --   --   --   --   < > = values in this interval not displayed. Liver Function Tests:  Recent Labs  11/09/13 0658 11/14/13 0410 11/17/13 0322  AST 23 28 21   ALT 18 24 19   ALKPHOS 80 68 70  BILITOT 1.0 0.4 0.6  PROT 7.5 6.5 6.9  ALBUMIN 2.9* 2.5* 2.6*   No results found for this basename: LIPASE, AMYLASE,  in the last 8760 hours No results found for this basename: AMMONIA,  in the last 8760 hours CBC:  Recent Labs   11/17/13 0322 11/18/13 0435 11/19/13 0253 11/30/13  WBC 8.4 9.2 10.2 9.1  NEUTROABS 6.0 5.9 6.4  --   HGB 10.9* 10.5* 10.9* 9.7*  HCT 32.6* 31.8* 31.0* 29*  MCV 92.9 90.6 89.6  --   PLT 491* 427* 442* 360   Lipid Panel:  Recent Labs  03/22/13 09/25/13 0321  CHOL 229* 191  HDL 63 86  LDLCALC 148 95  TRIG 92 48  CHOLHDL  --  2.2    Past Procedures:  09/30/13 CXR  IMPRESSION: No active cardiopulmonary disease.  10/01/13   Echocardiogram transesophageal                             LV EF: 55% -   60%  10/11/13 CT chest:  IMPRESSION:  Patchy bilateral airspace process with nodularity most prominent  over the right upper lobe likely due to infection. Patchy  bronchiectatic change as described. Small bilateral pleural  effusions. 1.3 cm pretracheal lymph node likely reactive. Recommend  followup CT in 6-8 weeks to re-evaluate the areas of nodularity.  Cardiomegaly with calcification of the mitral valve  annulus and left  atrial enlargement. Atherosclerotic coronary artery disease. Small  amount of pericardial fluid.  1.1 cm liver cyst unchanged.  11/17/13 CXR   IMPRESSION:  Mild bilateral pleural effusions are noted with left greater than  right. Stable bilateral lung opacities are noted consistent with  pneumonia or edema, with right worse than left.      Assessment/Plan HCAP (healthcare-associated pneumonia) 12/01/13 CXR multiple bilateral pneumonia. Cardiomegaly without other overt congestive heart failure findings.  Avelox 400mg  daily x [redacted] weeks along with FloraStor bid. EKG to monitor QT interval due to drug interaction with Amiodarone.  Elevate extremities  Daily weights  Taper steroids slowly: 40 mg x 3 days, 30 mg x 3 days, 20 mg x 3 days, 10 mg x 3 days, 5 mg x 3 days  PRN O2     Hypokalemia 12/01/13 K 3.4 12/02/13 K 3.0 12/03/13 Kcl 62meq daily, update BMP   Acute diastolic heart failure 5/49/82 Cardiology: cont Lasix 80mg  bid f/u Dr. Telford Nab 3  months. Call weight gain # 3 Ibs/24hr or #5Ibs/week call the office.  11/09/13 ED for O2 desaturation and SOB. Weight gain #4.5Ibs from yesterday.  11/30/13 BNP 489.7. Weights [phone] in the past 4 days.  12/01/13 CXR cardiomegaly without other overt congestive heart failure.  12/02/13 additional Furosemide 20mg  x1   Acute respiratory failure with hypoxia SOB, O2 dependent, afebrile. 12/01/13 CXR showed multiple bilateral pneumonia-Avelox 400mg  qd x 2 weeks started 12/02/13  Atrial fibrillation with rapid ventricular response 10/05/13 EKG SNF [facility] Afib-vent rate 100-110.  11/30/13 HR 90s. Takes Amiodarone 400mg  daily and Metoprolol 50mg  bid. Continue Eliquis 5mg  bid.     Depression It was decreased to 12.5mg  while in hospital 11/09/13-11/22/13-c/o less motive since SNF readmitted-increased Sertraline to 25mg  po daily 11/30/13-too soon to eval. Observe the patient.    HTN (hypertension) Controlled.      Family/ Staff Communication: observe the patient.   Goals of Care: AL  Labs/tests ordered: BMP, EKG

## 2013-12-03 NOTE — Assessment & Plan Note (Signed)
Controlled.  

## 2013-12-03 NOTE — Assessment & Plan Note (Signed)
11/08/13 Cardiology: cont Lasix 80mg  bid f/u Dr. Telford Nab 3 months. Call weight gain # 3 Ibs/24hr or #5Ibs/week call the office.  11/09/13 ED for O2 desaturation and SOB. Weight gain #4.5Ibs from yesterday.  11/30/13 BNP 489.7. Weights [phone] in the past 4 days.  12/01/13 CXR cardiomegaly without other overt congestive heart failure.  12/02/13 additional Furosemide 20mg  x1

## 2013-12-03 NOTE — Assessment & Plan Note (Signed)
It was decreased to 12.5mg  while in hospital 11/09/13-11/22/13-c/o less motive since SNF readmitted-increased Sertraline to 25mg  po daily 11/30/13-too soon to eval. Observe the patient.

## 2013-12-03 NOTE — Assessment & Plan Note (Signed)
12/01/13 K 3.4 12/02/13 K 3.0 12/03/13 Kcl 52meq daily, update BMP

## 2013-12-03 NOTE — Assessment & Plan Note (Signed)
SOB, O2 dependent, afebrile. 12/01/13 CXR showed multiple bilateral pneumonia-Avelox 400mg  qd x 2 weeks started 12/02/13

## 2013-12-03 NOTE — Assessment & Plan Note (Signed)
12/01/13 CXR multiple bilateral pneumonia. Cardiomegaly without other overt congestive heart failure findings.  Avelox 400mg  daily x [redacted] weeks along with FloraStor bid. EKG to monitor QT interval due to drug interaction with Amiodarone.  Elevate extremities  Daily weights  Taper steroids slowly: 40 mg x 3 days, 30 mg x 3 days, 20 mg x 3 days, 10 mg x 3 days, 5 mg x 3 days  PRN O2

## 2013-12-06 ENCOUNTER — Encounter: Payer: Self-pay | Admitting: Cardiology

## 2013-12-06 ENCOUNTER — Ambulatory Visit (INDEPENDENT_AMBULATORY_CARE_PROVIDER_SITE_OTHER): Payer: Medicare Other | Admitting: Cardiology

## 2013-12-06 VITALS — BP 99/72 | HR 121 | Ht 64.0 in | Wt 203.0 lb

## 2013-12-06 DIAGNOSIS — I5031 Acute diastolic (congestive) heart failure: Secondary | ICD-10-CM | POA: Diagnosis not present

## 2013-12-06 DIAGNOSIS — I4891 Unspecified atrial fibrillation: Secondary | ICD-10-CM

## 2013-12-06 DIAGNOSIS — I480 Paroxysmal atrial fibrillation: Secondary | ICD-10-CM

## 2013-12-06 DIAGNOSIS — I6529 Occlusion and stenosis of unspecified carotid artery: Secondary | ICD-10-CM

## 2013-12-06 DIAGNOSIS — Z7901 Long term (current) use of anticoagulants: Secondary | ICD-10-CM

## 2013-12-06 DIAGNOSIS — E876 Hypokalemia: Secondary | ICD-10-CM | POA: Diagnosis not present

## 2013-12-06 LAB — BASIC METABOLIC PANEL
BUN: 29 mg/dL — AB (ref 4–21)
CREATININE: 0.8 mg/dL (ref 0.5–1.1)
Glucose: 127 mg/dL
Potassium: 3.5 mmol/L (ref 3.4–5.3)
SODIUM: 138 mmol/L (ref 137–147)

## 2013-12-06 MED ORDER — METOLAZONE 2.5 MG PO TABS: ORAL_TABLET | ORAL | Status: DC

## 2013-12-06 NOTE — Patient Instructions (Addendum)
Your physician recommends that you schedule a follow-up appointment in: 7-10 days with Dr Warren Lacy or APP   Your physician has recommended you make the following change in your medication: Start Metolazone 2.5 mg every Tuesday and Friday and Increase potassium to 80 mg on days taking Metolazone

## 2013-12-06 NOTE — Progress Notes (Signed)
12/07/2013   PCP: Estill Dooms, MD   Chief Complaint  Patient presents with  . Follow-up    post hospital follow-up    Primary Cardiologist:Dr. Vita Barley   HPI:  80 year old WF is here for post hospital visit.  She was hospitalized 11/09/13 to 11/22/13 for  Acute Resp failure,  HCAP, a fib with RVR.  At discharge plan was for DCCV at end of Oct on Eliquis.  She had been DCCV with TEE in August 2015 that was successful.   TEE 10/26/13 with "Spontaneous contrast is present in appendage. This does not completely clear from the tip, consistent with organizing thrombus.  So DCCV was not undertaken during this hospitalization. "   Today her weight is up 11 pounds and she has incresed lower ext edema.  At discharge her edema of legs was trace.  Now 2-3 +  Her HR was in the 80's but today with activity 121.  Her son tells me it runs in the 20s at [facility] but today has been a busy day for her.  She denies SOB or chest pain.  She is feeling well though her energy is not coming back very fast.  She is working with PT.     Allergies  Allergen Reactions  . Codeine     Unknown  . Cucumber Extract Nausea And Vomiting  . Diuretic [Buchu-Cornsilk-Ch Grass-Hydran]     Unknown  . Lipitor [Atorvastatin Calcium]     Unknown  . Penicillins Other (See Comments)    unknown  . Pravachol     Unknown    Current Outpatient Prescriptions  Medication Sig Dispense Refill  . albuterol (PROVENTIL) (2.5 MG/3ML) 0.083% nebulizer solution Take 2.5 mg by nebulization every 6 (six) hours as needed for wheezing or shortness of breath.      Marland Kitchen amiodarone (PACERONE) 400 MG tablet Take 1 tablet (400 mg total) by mouth 2 (two) times daily.  60 tablet  0  . apixaban (ELIQUIS) 5 MG TABS tablet Take 1 tablet (5 mg total) by mouth 2 (two) times daily.  60 tablet  11  . furosemide (LASIX) 80 MG tablet Take 80 mg by mouth 2 (two) times daily.      Marland Kitchen guaiFENesin-dextromethorphan (ROBITUSSIN DM) 100-10  MG/5ML syrup Take 10 mLs by mouth every 6 (six) hours as needed for cough.      . levalbuterol (XOPENEX) 0.63 MG/3ML nebulizer solution Take 3 mLs (0.63 mg total) by nebulization every 4 (four) hours as needed for wheezing or shortness of breath.  3 mL  12  . metoprolol (LOPRESSOR) 50 MG tablet Take 1 tablet (50 mg total) by mouth 2 (two) times daily.  60 tablet  0  . potassium chloride SA (KLOR-CON M20) 20 MEQ tablet Take 40 mEq by mouth daily.      . sertraline (ZOLOFT) 25 MG tablet Take 0.5 tablets (12.5 mg total) by mouth daily.  30 tablet  0  . vitamin E 1000 UNIT capsule Take 1,000 Units by mouth daily.      . metolazone (ZAROXOLYN) 2.5 MG tablet Take 1 tablets every tuesdays and friday  30 tablet  4   No current facility-administered medications for this visit.    Past Medical History  Diagnosis Date  . Hyperlipidemia   . Arrhythmia     PVC  . Retinal detachment   . Renal disorder   . Allergy   . Occlusion and  stenosis of carotid artery without mention of cerebral infarction 03/24/2012  . Pneumonia, organism unspecified 11/192013  . Depressive disorder, not elsewhere classified 03/05/2011  . Other premature beats 03/05/2011  . Other seborrheic keratosis 11/20/2010  . Syncope and collapse 10/02/2010  . Palpitations 11/21/2009  . Unspecified essential hypertension 06/27/2009  . Cardiomegaly 06/27/2009  . Other generalized ischemic cerebrovascular disease 06/27/2009  . Dizziness and giddiness 06/27/2009  . Unspecified vitamin D deficiency 05/16/2009  . Family history of sudden cardiac death (SCD) December 08, 2008  . Rash and other nonspecific skin eruption 07/05/2008  . Edema 03/15/2008  . Obesity, unspecified 07/25/2006  . Varicose veins of lower extremities with inflammation 03/16/2003  . Shortness of breath     Past Surgical History  Procedure Laterality Date  . Cholecystectomy  1983  . Cataract extraction Bilateral     Dr. [name]  . Tonsillectomy    . Keratosis    . Retinal detachment  surgery Left   . Cardioversion N/A 10/01/2013    Procedure: CARDIOVERSION;  Surgeon: Sanda Klein, MD;  Location: [facility] ENDOSCOPY;  Service: Cardiovascular;  Laterality: N/A;  . Tee without cardioversion N/A 10/01/2013    Procedure: TRANSESOPHAGEAL ECHOCARDIOGRAM (TEE);  Surgeon: Sanda Klein, MD;  Location: [facility] ENDOSCOPY;  Service: Cardiovascular;  Laterality: N/A;  . Tee without cardioversion N/A 11/15/2013    Procedure: TRANSESOPHAGEAL ECHOCARDIOGRAM (TEE);  Surgeon: Fay Records, MD;  Location: [facility] ENDOSCOPY;  Service: Cardiovascular;  Laterality: N/A;    EOF:HQRFXJO:IT colds or fevers,  weight increase 14 pounds since discharge. appetite  good Skin:no rashes or ulcers HEENT:no blurred vision, no congestion CV:see HPI PUL:see HPI GI:no diarrhea constipation or melena, no indigestion GU:no hematuria, no dysuria MS:no joint pain, no claudication Neuro:no syncope, no lightheadedness Endo:no diabetes, no thyroid disease  Wt Readings from Last 3 Encounters:  12/06/13 203 lb (92.08 kg)  11/23/13 189 lb 8 oz (85.957 kg)  11/22/13 189 lb 13.4 oz (86.11 kg)    PHYSICAL EXAM BP 99/72  Pulse 121  Ht 5\' 4"  (1.626 m)  Wt 203 lb (92.08 kg)  BMI 34.83 kg/m2 General:Pleasant affect, NAD Skin:Warm and dry, brisk capillary refill HEENT:normocephalic, sclera clear, mucus membranes moist Neck:supple, no JVD sitting up in chair, no bruits  Heart:irreg irreg  without murmur, gallup, rub or click Lungs:diminished breath sounds,  without rales, + rhonchi, no wheezes GPQ:DIYM, non tender, + BS, do not palpate liver spleen or masses Ext:2-3+ lower ext edema,  2+ radial pulses Neuro:alert and oriented X 3 MAE, follows commands, + facial symmetry  EKG:a fib with RVR 121 .  No acute changes otherwise. Labs: 12/06/13  K+3.5 up from 3.0, Na 138, chloride 95 CO2 37 glucose 127 BUN 29 creatinine 0.85   WBC 9.1 hemoglobin 9.7 which has drifted down hematocrit 39.1. BNP 489  ASSESSMENT AND PLAN Acute  diastolic heart failure Weight climbing since discharge.  Her BP is borderline today, will add metolazone 2.5 mg on Tuesdays and Fridays, with extra K+ those days.  I would like to be more aggressive but not sure her BP will tolerate.  Will have her follow up in 7-10 days with Dr. Vita Barley or APP.    Atrial fibrillation with rapid ventricular response Today with RVR, planned was to cardiovert on eliquis for 1 month, but volume overloaded today will hold DCCV until next visit. I have left her amiodarone at current dose until next visit.    Hypokalemia Has been hypokalemic but K+ replaced and today up to 3.5.

## 2013-12-07 ENCOUNTER — Encounter: Payer: Self-pay | Admitting: Nurse Practitioner

## 2013-12-07 ENCOUNTER — Other Ambulatory Visit: Payer: Self-pay | Admitting: Nurse Practitioner

## 2013-12-07 DIAGNOSIS — Z7901 Long term (current) use of anticoagulants: Secondary | ICD-10-CM | POA: Insufficient documentation

## 2013-12-07 DIAGNOSIS — E876 Hypokalemia: Secondary | ICD-10-CM

## 2013-12-07 DIAGNOSIS — I5031 Acute diastolic (congestive) heart failure: Secondary | ICD-10-CM

## 2013-12-07 NOTE — Assessment & Plan Note (Addendum)
Weight climbing since discharge.  Her BP is borderline today, will add metolazone 2.5 mg on Tuesdays and Fridays, with extra K+ those days.  I would like to be more aggressive but not sure her BP will tolerate.  Will have her follow up in 7-10 days with Dr. Vita Barley or APP.

## 2013-12-07 NOTE — Assessment & Plan Note (Signed)
Has been hypokalemic but K+ replaced and today up to 3.5.

## 2013-12-07 NOTE — Assessment & Plan Note (Signed)
Today with RVR, planned was to cardiovert on eliquis for 1 month, but volume overloaded today will hold DCCV until next visit. I have left her amiodarone at current dose until next visit.

## 2013-12-13 LAB — BASIC METABOLIC PANEL
BUN: 32 mg/dL — AB (ref 4–21)
Creatinine: 0.9 mg/dL (ref 0.5–1.1)
GLUCOSE: 70 mg/dL
Potassium: 3.3 mmol/L — AB (ref 3.4–5.3)
Sodium: 140 mmol/L (ref 137–147)

## 2013-12-14 ENCOUNTER — Non-Acute Institutional Stay (SKILLED_NURSING_FACILITY): Payer: Medicare Other | Admitting: Nurse Practitioner

## 2013-12-14 ENCOUNTER — Encounter: Payer: Self-pay | Admitting: Nurse Practitioner

## 2013-12-14 DIAGNOSIS — I10 Essential (primary) hypertension: Secondary | ICD-10-CM

## 2013-12-14 DIAGNOSIS — E876 Hypokalemia: Secondary | ICD-10-CM

## 2013-12-14 DIAGNOSIS — F32A Depression, unspecified: Secondary | ICD-10-CM

## 2013-12-14 DIAGNOSIS — F329 Major depressive disorder, single episode, unspecified: Secondary | ICD-10-CM

## 2013-12-14 DIAGNOSIS — J9601 Acute respiratory failure with hypoxia: Secondary | ICD-10-CM

## 2013-12-14 DIAGNOSIS — I5031 Acute diastolic (congestive) heart failure: Secondary | ICD-10-CM

## 2013-12-14 DIAGNOSIS — D638 Anemia in other chronic diseases classified elsewhere: Secondary | ICD-10-CM

## 2013-12-14 DIAGNOSIS — J189 Pneumonia, unspecified organism: Secondary | ICD-10-CM

## 2013-12-14 NOTE — Assessment & Plan Note (Signed)
0/14/15 CXR multiple bilateral pneumonia. Cardiomegaly without other overt congestive heart failure findings.  Completed Avelox 400mg  daily x [redacted] weeks along with FloraStor bid. EKG to monitor QT interval due to drug interaction with Amiodarone.  Elevate extremities  Daily weights  Taper steroids slowly: 40 mg x 3 days, 30 mg x 3 days, 20 mg x 3 days, 10 mg x 3 days, 5 mg x 3 days  PRN O2

## 2013-12-14 NOTE — Assessment & Plan Note (Signed)
11/30/13 Hgb 9.7. No active bleed.

## 2013-12-14 NOTE — Assessment & Plan Note (Signed)
Stable since Sertraline to 25mg  po daily 11/30/13

## 2013-12-14 NOTE — Progress Notes (Signed)
Patient ID: Julie Haas, female   DOB: [birth year], 80 y.o.   MRN: [id number]    Code Status: DNR, living will.   Allergies  Allergen Reactions  . Codeine     Unknown  . Cucumber Extract Nausea And Vomiting  . Diuretic [Buchu-Cornsilk-Ch Grass-Hydran]     Unknown  . Lipitor [Atorvastatin Calcium]     Unknown  . Penicillins Other (See Comments)    unknown  . Pravachol     Unknown    Chief Complaint  Patient presents with  . Medical Management of Chronic Issues  . Acute Visit    CHF, hypokalemia    HPI: Patient is a 80 y.o. female seen in the SNF at [facility] today for evaluation of CHF, hypokalemia,  and other chronic medical conditions.     Hospitalized 11/09/13-11/22/13 Acute respiratory failure with hypoxia HTN (hypertension) Hyperlipidemia Atrial fibrillation with rapid ventricular response Acute diastolic heart failure HCAP (healthcare-associated pneumonia) Lung nodule Hypokalemia Depression Respiratory distress    Hospitalized 10/11/2013-10/20/2013 for HCAP (healthcare-associated pneumonia) Atrial fibrillation Acute diastolic heart failure Hypoxemia Acute respiratory failure with hypoxia.  The patient had recent admission to hospital for PNA s/p TEE/DCCV (discharged on 8/15) and presented to [facility] ED with main concern of progressively worsening shortness of breath, mostly exertional but occasionally present at rest. Associated with productive cough of yellow sputum, subjective fevers, chills, LE edema and 2 pillow orthopnea      Hospitalized 09/24/2013-10/02/2013 for Acute diastolic heart failure HTN (hypertension) Hyperlipidemia Wound of left leg(since Nov 2014-f/u [facility]) Bilateral moderate carotid disease by doppler Atrial fibrillation-conversion 10/01/13 Mild aortic stenosis with normal LVF Pneumonia(Levaquin 750mg  daily) Acute resp failure     Presented ED with SOB. She had a prior history of PVCs but no confirmed AF per her recollection. She was noted in AF  with rates in the >150 range. She was given diltiazem en route. In the ED, she was given additional diltiazem and placed on an infusion with better control of her HR in the 110s-120s. She was admitted to hospital for AF-RVR and continue on IV dilt. IV heparin added but later changed to Eliquis. She was given 20mg  of IV lasix for volume overload. She ultimately diuresed -14L net. TEE/DCCV was completed on Aug 14. Fortaz and vancomycin started Monday, Aug 10 for PNA. WBC decreased. She was changed to PO Levaquin 750 mg a day x 5 days(dc 10/07/13)   Problem List Items Addressed This Visit   Hypokalemia - Primary     12/01/13 K 3.4 12/02/13 K 3.0 12/03/13 Kcl 74meq daily, 80mg  T +F with Metolazone since 12/06/13 12/06/13 K 3.5  12/13/13 K 3.3-continue Kcl 4meq daily, f/u BMP and BNP     HTN (hypertension) (Chronic)     Controlled.       HCAP (healthcare-associated pneumonia)     0/14/15 CXR multiple bilateral pneumonia. Cardiomegaly without other overt congestive heart failure findings.  Completed Avelox 400mg  daily x [redacted] weeks along with FloraStor bid. EKG to monitor QT interval due to drug interaction with Amiodarone.  Elevate extremities  Daily weights  Taper steroids slowly: 40 mg x 3 days, 30 mg x 3 days, 20 mg x 3 days, 10 mg x 3 days, 5 mg x 3 days  PRN O2        Depression     Stable since Sertraline to 25mg  po daily 11/30/13      Anemia of chronic disease     11/30/13 Hgb 9.7.  No active bleed.      Acute respiratory failure with hypoxia     SOB, O2 dependent, afebrile. 12/01/13 CXR showed multiple bilateral pneumonia-completed Avelox 400mg  qd x 2 weeks started 12/02/13.      Acute diastolic heart failure     12/13/13 Metolazone 2.5mg  daily, Furosemide 80mg  bid, update BMP and BNP. Persisted SOB and BLE edema.        Review of Systems:  Review of Systems  Constitutional: Negative for fever, chills, weight loss, malaise/fatigue and diaphoresis.  HENT: Positive for  hearing loss. Negative for congestion, ear discharge, ear pain, nosebleeds, sore throat and tinnitus.   Eyes: Negative for blurred vision, double vision, photophobia, pain, discharge and redness.  Respiratory: Positive for cough. Negative for hemoptysis, sputum production, shortness of breath, wheezing and stridor.        Hacking  Cardiovascular: Positive for leg swelling and PND. Negative for chest pain, palpitations, orthopnea and claudication.       BLE L>R. 1+  Gastrointestinal: Negative for heartburn, nausea, vomiting, abdominal pain, diarrhea, constipation, blood in stool and melena.  Genitourinary: Negative for dysuria, urgency, frequency, hematuria and flank pain.  Musculoskeletal: Negative for back pain, falls, joint pain, myalgias and neck pain.       No further hematuria since Foley Catheter removed.   Skin: Negative for itching and rash.       Left lower leg wound since Nov 2014-f/u [facility] weekly.   Neurological: Negative for dizziness, tingling, tremors, sensory change, speech change, focal weakness, seizures, loss of consciousness, weakness and headaches.  Endo/Heme/Allergies: Negative for environmental allergies and polydipsia. Does not bruise/bleed easily.  Psychiatric/Behavioral: Positive for depression. Negative for suicidal ideas, hallucinations, memory loss and substance abuse. The patient is nervous/anxious. The patient does not have insomnia.        Slept better since Furosemide 80mg  bid 10/29/13     Past Medical History  Diagnosis Date  . Hyperlipidemia   . Arrhythmia     PVC  . Retinal detachment   . Renal disorder   . Allergy   . Occlusion and stenosis of carotid artery without mention of cerebral infarction 03/24/2012  . Pneumonia, organism unspecified 11/192013  . Depressive disorder, not elsewhere classified 03/05/2011  . Other premature beats 03/05/2011  . Other seborrheic keratosis 11/20/2010  . Syncope and collapse 10/02/2010  . Palpitations  11/21/2009  . Unspecified essential hypertension 06/27/2009  . Cardiomegaly 06/27/2009  . Other generalized ischemic cerebrovascular disease 06/27/2009  . Dizziness and giddiness 06/27/2009  . Unspecified vitamin D deficiency 05/16/2009  . Family history of sudden cardiac death (SCD) 12-25-08  . Rash and other nonspecific skin eruption 07/05/2008  . Edema 03/15/2008  . Obesity, unspecified 07/25/2006  . Varicose veins of lower extremities with inflammation 03/16/2003  . Shortness of breath    Past Surgical History  Procedure Laterality Date  . Cholecystectomy  1983  . Cataract extraction Bilateral     Dr. [name]  . Tonsillectomy    . Keratosis    . Retinal detachment surgery Left   . Cardioversion N/A 10/01/2013    Procedure: CARDIOVERSION;  Surgeon: Sanda Klein, MD;  Location: [facility] ENDOSCOPY;  Service: Cardiovascular;  Laterality: N/A;  . Tee without cardioversion N/A 10/01/2013    Procedure: TRANSESOPHAGEAL ECHOCARDIOGRAM (TEE);  Surgeon: Sanda Klein, MD;  Location: [facility] ENDOSCOPY;  Service: Cardiovascular;  Laterality: N/A;  . Tee without cardioversion N/A 11/15/2013    Procedure: TRANSESOPHAGEAL ECHOCARDIOGRAM (TEE);  Surgeon: Fay Records, MD;  Location: [facility] ENDOSCOPY;  Service: Cardiovascular;  Laterality: N/A;   Social History:   reports that she has never smoked. She has never used smokeless tobacco. She reports that she does not drink alcohol or use illicit drugs.  Family History  Problem Relation Age of Onset  . Arrhythmia      Long QT in first degree relatives,  . Heart attack Father   . Heart disease Father   . Heart attack Brother   . Heart disease Brother   . Pneumonia Mother     Medications: Patient's Medications  New Prescriptions   No medications on file  Previous Medications   ALBUTEROL (PROVENTIL) (2.5 MG/3ML) 0.083% NEBULIZER SOLUTION    Take 2.5 mg by nebulization every 6 (six) hours as needed for wheezing or shortness of breath.   AMIODARONE (PACERONE) 400  MG TABLET    Take 1 tablet (400 mg total) by mouth 2 (two) times daily.   APIXABAN (ELIQUIS) 5 MG TABS TABLET    Take 1 tablet (5 mg total) by mouth 2 (two) times daily.   FUROSEMIDE (LASIX) 80 MG TABLET    Take 80 mg by mouth 2 (two) times daily.   GUAIFENESIN-DEXTROMETHORPHAN (ROBITUSSIN DM) 100-10 MG/5ML SYRUP    Take 10 mLs by mouth every 6 (six) hours as needed for cough.   LEVALBUTEROL (XOPENEX) 0.63 MG/3ML NEBULIZER SOLUTION    Take 3 mLs (0.63 mg total) by nebulization every 4 (four) hours as needed for wheezing or shortness of breath.   METOLAZONE (ZAROXOLYN) 2.5 MG TABLET    Take 1 tablets every tuesdays and friday   METOPROLOL (LOPRESSOR) 50 MG TABLET    Take 1 tablet (50 mg total) by mouth 2 (two) times daily.   POTASSIUM CHLORIDE SA (KLOR-CON M20) 20 MEQ TABLET    Take 40 mEq by mouth daily.   SERTRALINE (ZOLOFT) 25 MG TABLET    Take 0.5 tablets (12.5 mg total) by mouth daily.   VITAMIN E 1000 UNIT CAPSULE    Take 1,000 Units by mouth daily.  Modified Medications   No medications on file  Discontinued Medications   No medications on file     Physical Exam: Physical Exam  Constitutional:  overwewight  HENT:  Right Ear: External ear normal.  Left Ear: External ear normal.  Nose: Nose normal.  Eyes:  Corrective lenses.  Neck: Normal range of motion. Neck supple. No JVD present. No tracheal deviation present. No thyromegaly present.  Cardiovascular: Intact distal pulses.  Exam reveals no gallop and no friction rub.   Murmur heard. Varicose leg veins. 2+ edema LLE>RLE. HR 90s irregular. Systolic murmur apex of heart 2-3/6 noted.   Pulmonary/Chest: No respiratory distress. She has no wheezes. She has rales (bronchial rattle).  Moist rales posterior lower lungs.   Abdominal: Soft. Bowel sounds are normal. She exhibits no distension and no mass. There is no tenderness.  Musculoskeletal: Normal range of motion. She exhibits edema. She exhibits no tenderness.  1+edema BLE L>R    Lymphadenopathy:    She has no cervical adenopathy.  Neurological: She is alert. No cranial nerve deficit. Coordination normal.  Skin: No rash noted. No erythema. No pallor.  Open area is about 35mm by 14 mm. Some swelling of the leg above this. Small hematoma at superior end of the injury.  Psychiatric: Her behavior is normal. Judgment and thought content normal.  Depressive and anxious mood.     Filed Vitals:   12/14/13 1130  BP: 120/70  Pulse:  66  Temp: 97 F (36.1 C)  TempSrc: Tympanic  Resp: 20  SpO2: 97%      Labs reviewed: Basic Metabolic Panel:  Recent Labs  09/24/13 0834  10/12/13 0404  11/12/13 0520  11/20/13 0338 11/21/13 0328 11/22/13 0306  12/02/13 12/06/13 12/13/13  NA  --   < > 137  < > 135*  < > 137 131* 134*  < > 138 138 140  K  --   < > 3.5*  < > 2.7*  < > 4.7 4.3 4.4  < > 3.0* 3.5 3.3*  CL  --   < > 95*  < > 82*  < > 94* 86* 90*  --   --   --   --   CO2  --   < > 27  < > 42*  < > 36* 35* 36*  --   --   --   --   GLUCOSE  --   < > 92  < > 88  < > 102* 86 92  --   --   --   --   BUN  --   < > 13  < > 19  < > 17 18 20   < > 14 29* 32*  CREATININE  --   < > 0.60  < > 0.63  < > 0.58 0.70 0.69  < > 0.7 0.8 0.9  CALCIUM  --   < > 7.9*  < > 8.1*  < > 8.9 8.2* 8.2*  --   --   --   --   MG  --   --  1.7  --  1.6  --   --   --   --   --   --   --   --   TSH 2.580  --   --   --   --   --   --   --   --   --   --   --   --   < > = values in this interval not displayed. Liver Function Tests:  Recent Labs  11/09/13 0658 11/14/13 0410 11/17/13 0322  AST 23 28 21   ALT 18 24 19   ALKPHOS 80 68 70  BILITOT 1.0 0.4 0.6  PROT 7.5 6.5 6.9  ALBUMIN 2.9* 2.5* 2.6*   No results found for this basename: LIPASE, AMYLASE,  in the last 8760 hours No results found for this basename: AMMONIA,  in the last 8760 hours CBC:  Recent Labs  11/17/13 0322 11/18/13 0435 11/19/13 0253 11/30/13  WBC 8.4 9.2 10.2 9.1  NEUTROABS 6.0 5.9 6.4  --   HGB 10.9* 10.5* 10.9* 9.7*   HCT 32.6* 31.8* 31.0* 29*  MCV 92.9 90.6 89.6  --   PLT 491* 427* 442* 360   Lipid Panel:  Recent Labs  03/22/13 09/25/13 0321  CHOL 229* 191  HDL 63 86  LDLCALC 148 95  TRIG 92 48  CHOLHDL  --  2.2    Past Procedures:  09/30/13 CXR  IMPRESSION: No active cardiopulmonary disease.  10/01/13   Echocardiogram transesophageal                             LV EF: 55% -   60%  10/11/13 CT chest:  IMPRESSION:  Patchy bilateral airspace process with nodularity most prominent  over the right upper lobe likely due to infection. Patchy  bronchiectatic  change as described. Small bilateral pleural  effusions. 1.3 cm pretracheal lymph node likely reactive. Recommend  followup CT in 6-8 weeks to re-evaluate the areas of nodularity.  Cardiomegaly with calcification of the mitral valve annulus and left  atrial enlargement. Atherosclerotic coronary artery disease. Small  amount of pericardial fluid.  1.1 cm liver cyst unchanged.  11/17/13 CXR   IMPRESSION:  Mild bilateral pleural effusions are noted with left greater than  right. Stable bilateral lung opacities are noted consistent with  pneumonia or edema, with right worse than left.      Assessment/Plan Hypokalemia 12/01/13 K 3.4 12/02/13 K 3.0 12/03/13 Kcl 35meq daily, 80mg  T +F with Metolazone since 12/06/13 12/06/13 K 3.5  12/13/13 K 3.3-continue Kcl 76meq daily, f/u BMP and BNP   Acute diastolic heart failure 67/34/19 Metolazone 2.5mg  daily, Furosemide 80mg  bid, update BMP and BNP. Persisted SOB and BLE edema.   HTN (hypertension) Controlled.     HCAP (healthcare-associated pneumonia) 0/14/15 CXR multiple bilateral pneumonia. Cardiomegaly without other overt congestive heart failure findings.  Completed Avelox 400mg  daily x [redacted] weeks along with FloraStor bid. EKG to monitor QT interval due to drug interaction with Amiodarone.  Elevate extremities  Daily weights  Taper steroids slowly: 40 mg x 3 days, 30 mg x 3  days, 20 mg x 3 days, 10 mg x 3 days, 5 mg x 3 days  PRN O2      Acute respiratory failure with hypoxia SOB, O2 dependent, afebrile. 12/01/13 CXR showed multiple bilateral pneumonia-completed Avelox 400mg  qd x 2 weeks started 12/02/13.    Anemia of chronic disease 11/30/13 Hgb 9.7. No active bleed.    Depression Stable since Sertraline to 25mg  po daily 11/30/13      Family/ Staff Communication: observe the patient.   Goals of Care: AL  Labs/tests ordered: BMP and BNP

## 2013-12-14 NOTE — Assessment & Plan Note (Signed)
Controlled.  

## 2013-12-14 NOTE — Assessment & Plan Note (Signed)
12/01/13 K 3.4 12/02/13 K 3.0 12/03/13 Kcl 67meq daily, 80mg  T +F with Metolazone since 12/06/13 12/06/13 K 3.5  12/13/13 K 3.3-continue Kcl 25meq daily, f/u BMP and BNP

## 2013-12-14 NOTE — Assessment & Plan Note (Signed)
SOB, O2 dependent, afebrile. 12/01/13 CXR showed multiple bilateral pneumonia-completed Avelox 400mg  qd x 2 weeks started 12/02/13.

## 2013-12-14 NOTE — Assessment & Plan Note (Signed)
12/13/13 Metolazone 2.5mg  daily, Furosemide 80mg  bid, update BMP and BNP. Persisted SOB and BLE edema.

## 2013-12-17 ENCOUNTER — Other Ambulatory Visit: Payer: Self-pay | Admitting: Nurse Practitioner

## 2013-12-17 ENCOUNTER — Encounter: Payer: Self-pay | Admitting: Nurse Practitioner

## 2013-12-17 ENCOUNTER — Telehealth: Payer: Self-pay | Admitting: Cardiology

## 2013-12-17 DIAGNOSIS — E876 Hypokalemia: Secondary | ICD-10-CM

## 2013-12-17 LAB — BASIC METABOLIC PANEL
BUN: 35 mg/dL — AB (ref 4–21)
Creatinine: 1 mg/dL (ref 0.5–1.1)
Glucose: 81 mg/dL
POTASSIUM: 3.3 mmol/L — AB (ref 3.4–5.3)
SODIUM: 142 mmol/L (ref 137–147)

## 2013-12-17 NOTE — Telephone Encounter (Signed)
Will forward to [employer]

## 2013-12-17 NOTE — Telephone Encounter (Signed)
Pt's son is not going to be able to come with here on Tuesday for her appointment with [facility]. He would like for the nurse to call him after her office visit.He really wants to know when will they do the cardioversion?

## 2013-12-21 ENCOUNTER — Encounter: Payer: Self-pay | Admitting: Cardiology

## 2013-12-21 ENCOUNTER — Ambulatory Visit (INDEPENDENT_AMBULATORY_CARE_PROVIDER_SITE_OTHER): Payer: Medicare Other | Admitting: Cardiology

## 2013-12-21 ENCOUNTER — Other Ambulatory Visit: Payer: Self-pay | Admitting: *Deleted

## 2013-12-21 VITALS — BP 92/78 | HR 111 | Ht 64.0 in | Wt 173.2 lb

## 2013-12-21 DIAGNOSIS — I213 ST elevation (STEMI) myocardial infarction of unspecified site: Secondary | ICD-10-CM

## 2013-12-21 DIAGNOSIS — I5031 Acute diastolic (congestive) heart failure: Secondary | ICD-10-CM

## 2013-12-21 DIAGNOSIS — I6529 Occlusion and stenosis of unspecified carotid artery: Secondary | ICD-10-CM

## 2013-12-21 DIAGNOSIS — I513 Intracardiac thrombosis, not elsewhere classified: Secondary | ICD-10-CM | POA: Insufficient documentation

## 2013-12-21 DIAGNOSIS — I480 Paroxysmal atrial fibrillation: Secondary | ICD-10-CM

## 2013-12-21 DIAGNOSIS — J189 Pneumonia, unspecified organism: Secondary | ICD-10-CM

## 2013-12-21 DIAGNOSIS — D689 Coagulation defect, unspecified: Secondary | ICD-10-CM

## 2013-12-21 DIAGNOSIS — R5383 Other fatigue: Secondary | ICD-10-CM

## 2013-12-21 DIAGNOSIS — Z79899 Other long term (current) drug therapy: Secondary | ICD-10-CM

## 2013-12-21 NOTE — Assessment & Plan Note (Signed)
She remains in AF. She has been on Amiodarone 400 mg BID since 11/22/13

## 2013-12-21 NOTE — Progress Notes (Signed)
12/21/2013 Julie Haas   [birth year]  [id number]  Primary Physicia GREEN, Julie Spare, MD Primary Cardiologist: Dr Percival Spanish  HPI:  80 year old with paroxysmal atrial fibrillation on Eliquis, previously hospitalized from 09/24/13 with pneumonia/A. fib with RVR, cardioverted on 10/01/13 successfully however reverted back to atrial fibrillation on 10/05/13. She is currently living at [facility]. She was admitted with respiratory failure 11/09/13. She was hospitalized till 11/22/13. TEE during that admission showed LA smoke. Amiodarone was added with plans for eventual cardioversion. She was seen by Julie Haas 12/06/13 and noted to be volume overloaded. Her wgt was 203. Metolazone 2.5 mg Tues/ Friday was added. She is here now for follow up. Her wgt is down to 173. She feels better. Bun/Scr were 35/1.0 on lab drawn 12/17/13.    Current Outpatient Prescriptions  Medication Sig Dispense Refill  . albuterol (PROVENTIL) (2.5 MG/3ML) 0.083% nebulizer solution Take 2.5 mg by nebulization every 6 (six) hours as needed for wheezing or shortness of breath.    Marland Kitchen amiodarone (PACERONE) 400 MG tablet Take 1 tablet (400 mg total) by mouth 2 (two) times daily. (Patient taking differently: Take 400 mg by mouth daily. ) 60 tablet 0  . apixaban (ELIQUIS) 5 MG TABS tablet Take 1 tablet (5 mg total) by mouth 2 (two) times daily. 60 tablet 11  . furosemide (LASIX) 80 MG tablet Take 80 mg by mouth 2 (two) times daily.    Marland Kitchen guaiFENesin-dextromethorphan (ROBITUSSIN DM) 100-10 MG/5ML syrup Take 10 mLs by mouth every 6 (six) hours as needed for cough.    Marland Kitchen ipratropium (ATROVENT) 0.02 % nebulizer solution Inhale 0.2 mg into the lungs as needed.  0  . levalbuterol (XOPENEX) 0.63 MG/3ML nebulizer solution Take 3 mLs (0.63 mg total) by nebulization every 4 (four) hours as needed for wheezing or shortness of breath. 3 mL 12  . metoprolol (LOPRESSOR) 50 MG tablet Take 1 tablet (50 mg total) by mouth 2 (two) times daily. 60  tablet 0  . potassium chloride SA (KLOR-CON M20) 20 MEQ tablet Take 100 mEq by mouth daily.     . sertraline (ZOLOFT) 25 MG tablet Take 0.5 tablets (12.5 mg total) by mouth daily. 30 tablet 0  . vitamin E 1000 UNIT capsule Take 1,000 Units by mouth daily.     No current facility-administered medications for this visit.    Allergies  Allergen Reactions  . Codeine     Unknown  . Cucumber Extract Nausea And Vomiting  . Diuretic [Buchu-Cornsilk-Ch Grass-Hydran]     Unknown  . Lipitor [Atorvastatin Calcium]     Unknown  . Penicillins Other (See Comments)    unknown  . Pravachol     Unknown    History   Social History  . Marital Status: Widowed    Spouse Name: N/A    Number of Children: N/A  . Years of Education: N/A   Occupational History  . Housewife    Social History Main Topics  . Smoking status: Never Smoker   . Smokeless tobacco: Never Used  . Alcohol Use: No  . Drug Use: No  . Sexual Activity: No   Other Topics Concern  . Not on file   Social History Narrative   The patient is widowed, husband died 02/24/11.  She has two children and two     grandchildren.     She never smoked cigarettes    Does not drink alcohol.    Drinks 2 cups caffeine coffee,  Lives at [facility] since 09/03/2004   Living Will   Exercise: walks daily           Review of Systems: General: negative for chills, fever, night sweats or weight changes.  Cardiovascular: negative for chest pain, dyspnea on exertion, edema, orthopnea, palpitations, paroxysmal nocturnal dyspnea or shortness of breath Dermatological: negative for rash Respiratory: negative for cough or wheezing Urologic: negative for hematuria Abdominal: negative for nausea, vomiting, diarrhea, bright red blood per rectum, melena, or hematemesis Neurologic: negative for visual changes, syncope, or dizziness All other systems reviewed and are otherwise negative except as noted above.    Blood pressure 92/78, pulse  111, height 5\' 4"  (1.626 m), weight 173 lb 3.2 oz (78.563 kg).  General appearance: alert, cooperative, no distress and in wheel chair Lungs: clear to auscultation bilaterally Heart: irregularly irregular rhythm Extremities: trace edema  EKG AF with VR 110  ASSESSMENT AND PLAN:   Acute diastolic heart failure Normal LVF by echo Aug 2015. Metolazone 12/06/13 ago and her wgt is down 20lbs ! (173).   Atrial fibrillation with rapid ventricular response She remains in AF. She has been on Amiodarone 400 mg BID since 11/22/13  Atrial thrombus on TEE 11/15/13 She has been on Eliquis 5 mg BID    PLAN  Her CHF is improved, she may actually be a little over diuresed. I will decrease her Amiodarone to 400 mg daily, (started 11/22/13). She should stop her metolazone. I spoke with Dr Claiborne Billings who was in the office today. He felt we should repeat her TEE with cardioversion to document resolution of her LA thrombus. The pt is agreeable to this. She'll need labs pre cardioversion as her K+ has been running consistently low.   Julie Haas KPA-C 12/21/2013 2:23 PM

## 2013-12-21 NOTE — Assessment & Plan Note (Signed)
She has been on Eliquis 5 mg BID

## 2013-12-21 NOTE — Assessment & Plan Note (Addendum)
Normal LVF by echo Aug 2015. Metolazone 12/06/13 ago and her wgt is down 20lbs ! (173).

## 2013-12-21 NOTE — Addendum Note (Signed)
Addended by: Vennie Homans on: 12/21/2013 03:27 PM   Modules accepted: Medications

## 2013-12-21 NOTE — Patient Instructions (Addendum)
Your physician has recommended you make the following change in your medication: STOP Metolazone and decrease Amiodarone 400 mg daily.  Your physician has requested that you have a TEE/Cardioversion. During a TEE, sound waves are used to create images of your heart. It provides your doctor with information about the size and shape of your heart and how well your heart's chambers and valves are working. In this test, a transducer is attached to the end of a flexible tube that is guided down you throat and into your esophagus (the tube leading from your mouth to your stomach) to get a more detailed image of your heart. Once the TEE has determined that a blood clot is not present, the cardioversion begins. Electrical Cardioversion uses a jolt of electricity to your heart either through paddles or wired patches attached to your chest. This is a controlled, usually prescheduled, procedure. This procedure is done at the hospital and you are not awake during the procedure. You usually go home the day of the procedure. Please see the instruction sheet given to you today for more information.

## 2013-12-23 ENCOUNTER — Ambulatory Visit (HOSPITAL_COMMUNITY)
Admission: RE | Admit: 2013-12-23 | Discharge: 2013-12-23 | Disposition: A | Discharge status: Skilled Nursing Facility | Payer: No Typology Code available for payment source | Source: Ambulatory Visit | Attending: Cardiology | Admitting: Cardiology

## 2013-12-23 ENCOUNTER — Encounter (HOSPITAL_COMMUNITY)
Admission: RE | Disposition: A | Discharge status: Skilled Nursing Facility | Payer: Self-pay | Source: Ambulatory Visit | Attending: Cardiology

## 2013-12-23 ENCOUNTER — Telehealth: Payer: Self-pay | Admitting: *Deleted

## 2013-12-23 ENCOUNTER — Non-Acute Institutional Stay (SKILLED_NURSING_FACILITY): Payer: Medicare Other | Admitting: Internal Medicine

## 2013-12-23 ENCOUNTER — Telehealth: Payer: Self-pay | Admitting: Cardiology

## 2013-12-23 ENCOUNTER — Encounter (HOSPITAL_COMMUNITY): Payer: Self-pay | Admitting: Gastroenterology

## 2013-12-23 ENCOUNTER — Encounter: Payer: Self-pay | Admitting: Cardiology

## 2013-12-23 ENCOUNTER — Encounter (HOSPITAL_COMMUNITY): Payer: Self-pay | Admitting: Anesthesiology

## 2013-12-23 DIAGNOSIS — I10 Essential (primary) hypertension: Secondary | ICD-10-CM

## 2013-12-23 DIAGNOSIS — I480 Paroxysmal atrial fibrillation: Secondary | ICD-10-CM | POA: Insufficient documentation

## 2013-12-23 DIAGNOSIS — I481 Persistent atrial fibrillation: Secondary | ICD-10-CM

## 2013-12-23 DIAGNOSIS — R609 Edema, unspecified: Secondary | ICD-10-CM | POA: Insufficient documentation

## 2013-12-23 DIAGNOSIS — Z5309 Procedure and treatment not carried out because of other contraindication: Secondary | ICD-10-CM | POA: Diagnosis not present

## 2013-12-23 DIAGNOSIS — E876 Hypokalemia: Secondary | ICD-10-CM | POA: Insufficient documentation

## 2013-12-23 DIAGNOSIS — R0902 Hypoxemia: Secondary | ICD-10-CM

## 2013-12-23 DIAGNOSIS — I4819 Other persistent atrial fibrillation: Secondary | ICD-10-CM

## 2013-12-23 LAB — POCT I-STAT 4, (NA,K, GLUC, HGB,HCT)
Glucose, Bld: 99 mg/dL (ref 70–99)
HEMATOCRIT: 40 % (ref 36.0–46.0)
HEMOGLOBIN: 13.6 g/dL (ref 12.0–15.0)
POTASSIUM: 3.1 meq/L — AB (ref 3.7–5.3)
SODIUM: 138 meq/L (ref 137–147)

## 2013-12-23 SURGERY — Surgical Case

## 2013-12-23 MED ORDER — LIDOCAINE VISCOUS 2 % MT SOLN
OROMUCOSAL | Status: AC
  Filled 2013-12-23: qty 15

## 2013-12-23 MED ORDER — LACTATED RINGERS IV SOLN
INTRAVENOUS | Status: DC
  Administered 2013-12-23: 1000 mL via INTRAVENOUS

## 2013-12-23 MED ORDER — SODIUM CHLORIDE 0.9 % IJ SOLN: 3.0000 mL | INTRAMUSCULAR | Status: DC | PRN

## 2013-12-23 NOTE — Progress Notes (Signed)
Case cancelled due to low potassium

## 2013-12-23 NOTE — Telephone Encounter (Signed)
Patient had a K of 3.3 on 10/30 and was supposed to have this repeated on 11/3 while at the office but never had labs done.  Lab repeated today and potassium is 3.1.  TEE/DCCV cancelled.  Please reschedule TEE/DCCV for another day and patient needs labs the day before the procedure.  I will send her home today on potassium supplementation.

## 2013-12-23 NOTE — Interval H&P Note (Signed)
History and Physical Interval Note:  12/23/2013 9:27 AM  Julie Haas  has presented today for surgery, with the diagnosis of A FIB  The various methods of treatment have been discussed with the patient and family. After consideration of risks, benefits and other options for treatment, the patient has consented to  Procedure(s): TRANSESOPHAGEAL ECHOCARDIOGRAM (TEE) (N/A) CARDIOVERSION (N/A) as a surgical intervention .  The patient's history has been reviewed, patient examined, no change in status, stable for surgery.  I have reviewed the patient's chart and labs.  Questions were answered to the patient's satisfaction.     [name]

## 2013-12-23 NOTE — Progress Notes (Signed)
Patient ID: Julie Haas, female   DOB: 08-02-1925, 80 y.o.   MRN: [id number]    [facility] Room Number: N31  Place of Service: SNF (31) OFFICE   Allergies  Allergen Reactions  . Codeine     Unknown  . Cucumber Extract Nausea And Vomiting  . Diuretic [Buchu-Cornsilk-Ch Grass-Hydran]     Unknown  . Lipitor [Atorvastatin Calcium]     Unknown  . Penicillins Other (See Comments)    unknown  . Pravachol     Unknown    Chief Complaint  Patient presents with  . Medical Management of Chronic Issues    AF, hypoxia, hypokalemia    HPI:  Was scheduled to have electrical cardioversion today, but it was cancelled due to K+ 3.1. She is disappointed. AF rate is controlled at present.  She is much improved in the dyspnea and edema of lower legs since I last saw her The metolazone was stopped 12/21/13. She is on 116mEq of KCL daily. She remains on high dose Lasix.  Medications: Patient's Medications  New Prescriptions   No medications on file  Previous Medications   ALBUTEROL (PROVENTIL) (2.5 MG/3ML) 0.083% NEBULIZER SOLUTION    Take 2.5 mg by nebulization every 6 (six) hours as needed for wheezing or shortness of breath.   AMIODARONE (PACERONE) 200 MG TABLET    Take 200 mg by mouth daily.   APIXABAN (ELIQUIS) 5 MG TABS TABLET    Take 1 tablet (5 mg total) by mouth 2 (two) times daily.   FUROSEMIDE (LASIX) 80 MG TABLET    Take 80 mg by mouth 2 (two) times daily.   GUAIFENESIN-DEXTROMETHORPHAN (ROBITUSSIN DM) 100-10 MG/5ML SYRUP    Take 10 mLs by mouth every 6 (six) hours as needed for cough.   IPRATROPIUM (ATROVENT) 0.02 % NEBULIZER SOLUTION    Inhale 0.2 mg into the lungs every 4 (four) hours as needed for wheezing or shortness of breath.    LEVALBUTEROL (XOPENEX) 0.63 MG/3ML NEBULIZER SOLUTION    Take 3 mLs (0.63 mg total) by nebulization every 4 (four) hours as needed for wheezing or shortness of breath.   METOPROLOL (LOPRESSOR) 50 MG TABLET    Take 1  tablet (50 mg total) by mouth 2 (two) times daily.   MULTIPLE VITAMINS-MINERALS (PRESERVISION AREDS) CAPS    Take 1 capsule by mouth 2 (two) times daily.   POTASSIUM CHLORIDE SA (KLOR-CON M20) 20 MEQ TABLET    Take 100 mEq by mouth daily.    SERTRALINE (ZOLOFT) 25 MG TABLET    Take 0.5 tablets (12.5 mg total) by mouth daily.   VITAMIN E 1000 UNIT CAPSULE    Take 1,000 Units by mouth daily.  Modified Medications   No medications on file  Discontinued Medications   No medications on file     Review of Systems  Constitutional: Positive for fatigue. Negative for fever, activity change and appetite change.  Eyes: Positive for visual disturbance (Corrective lenses).  Respiratory: Positive for cough and shortness of breath. Negative for chest tightness and wheezing.        Hypoxia; using O2  Cardiovascular: Positive for leg swelling. Negative for chest pain and palpitations.       AF  Gastrointestinal: Negative.   Endocrine: Negative.   Genitourinary: Negative.   Musculoskeletal: Negative.   Neurological: Positive for weakness.       Mild confusion  Hematological:       Anemia  Psychiatric/Behavioral: Positive for confusion.  Filed Vitals:   12/23/13 1641  BP: 112/88  Pulse: 80  Temp: 97 F (36.1 C)  Resp: 18  SpO2: 97%   There is no weight on file to calculate BMI.  Physical Exam  Constitutional:  overwewight  HENT:  Right Ear: External ear normal.  Left Ear: External ear normal.  Nose: Nose normal.  Eyes:  Corrective lenses.  Neck: Normal range of motion. Neck supple. No JVD present. No tracheal deviation present. No thyromegaly present.  Cardiovascular: Intact distal pulses.  Exam reveals no gallop and no friction rub.   Murmur heard. Varicose leg veins. 1+ edema LLE>RLE. HR 90s irregular. Systolic murmur apex of heart 2-3/6 noted.   Pulmonary/Chest: No respiratory distress. She has no wheezes. She has rales (bronchial rattle).  Moist rales posterior lower lungs.    Abdominal: Soft. Bowel sounds are normal. She exhibits no distension and no mass. There is no tenderness.  Musculoskeletal: Normal range of motion. She exhibits edema. She exhibits no tenderness.  1+edema BLE L>R Unstable gait  Lymphadenopathy:    She has no cervical adenopathy.  Neurological: She is alert. No cranial nerve deficit. Coordination normal.  Skin: No rash noted. No erythema. No pallor.  Open area is about 38mm by 14 mm. Some swelling of the leg above this. Small hematoma at superior end of the injury.  Psychiatric: Her behavior is normal. Judgment and thought content normal.  Depressive and anxious mood.      Labs reviewed: Admission on 12/23/2013, Discharged on 12/23/2013  Component Date Value Ref Range Status  . Sodium 12/23/2013 138  137 - 147 mEq/L Final  . Potassium 12/23/2013 3.1* 3.7 - 5.3 mEq/L Final  . Glucose, Bld 12/23/2013 99  70 - 99 mg/dL Final  . HCT 12/23/2013 40.0  36.0 - 46.0 % Final  . Hemoglobin 12/23/2013 13.6  12.0 - 15.0 g/dL Final  Lab on 12/17/2013  Component Date Value Ref Range Status  . Glucose 12/17/2013 81   Final  . BUN 12/17/2013 35* 4 - 21 mg/dL Final  . Creatinine 12/17/2013 1.0  0.5 - 1.1 mg/dL Final  . Potassium 12/17/2013 3.3* 3.4 - 5.3 mmol/L Final  . Sodium 12/17/2013 142  137 - 147 mmol/L Final  [facility]ursing Home on 12/14/2013  Component Date Value Ref Range Status  . Glucose 12/13/2013 70   Final  . BUN 12/13/2013 32* 4 - 21 mg/dL Final  . Creatinine 12/13/2013 0.9  0.5 - 1.1 mg/dL Final  . Potassium 12/13/2013 3.3* 3.4 - 5.3 mmol/L Final  . Sodium 12/13/2013 140  137 - 147 mmol/L Final  Lab on 12/07/2013  Component Date Value Ref Range Status  . Glucose 12/06/2013 127   Final  . BUN 12/06/2013 29* 4 - 21 mg/dL Final  . Creatinine 12/06/2013 0.8  0.5 - 1.1 mg/dL Final  . Potassium 12/06/2013 3.5  3.4 - 5.3 mmol/L Final  . Sodium 12/06/2013 138  137 - 147 mmol/L Final  [facility]ursing Home on 12/03/2013  Component Date Value Ref  Range Status  . Glucose 12/01/2013 83   Final  . BUN 12/01/2013 15  4 - 21 mg/dL Final  . Creatinine 12/01/2013 0.7  0.5 - 1.1 mg/dL Final  . Potassium 12/01/2013 3.4  3.4 - 5.3 mmol/L Final  . Sodium 12/01/2013 137  137 - 147 mmol/L Final  . Glucose 12/02/2013 90   Final  . BUN 12/02/2013 14  4 - 21 mg/dL Final  . Creatinine 12/02/2013 0.7  0.5 - 1.1  mg/dL Final  . Potassium 12/02/2013 3.0* 3.4 - 5.3 mmol/L Final  . Sodium 12/02/2013 138  137 - 147 mmol/L Final  [facility]ursing Home on 11/30/2013  Component Date Value Ref Range Status  . Hemoglobin 11/30/2013 9.7* 12.0 - 16.0 g/dL Final  . HCT 11/30/2013 29* 36 - 46 % Final  . Platelets 11/30/2013 360  150 - 399 K/L Final  . WBC 11/30/2013 9.1   Final  Admission on 11/09/2013, Discharged on 11/22/2013  No results displayed because visit has over 200 results.    [facility]ursing Home on 11/05/2013  Component Date Value Ref Range Status  . Glucose 11/03/2013 93   Final  . BUN 11/03/2013 17  4 - 21 mg/dL Final  . Creatinine 11/03/2013 0.6  0.5 - 1.1 mg/dL Final  . Potassium 11/03/2013 3.2* 3.4 - 5.3 mmol/L Final  . Sodium 11/03/2013 139  137 - 147 mmol/L Final  Appointment on 10/29/2013  Component Date Value Ref Range Status  . Sed Rate 10/29/2013 75* 0 - 22 mm/hr Final  . Pro B Natriuretic peptide (BNP) 10/29/2013 444.0* 0.0 - 100.0 pg/mL Final  . WBC 10/29/2013 16.1* 4.0 - 10.5 K/uL Final  . RBC 10/29/2013 3.72* 3.87 - 5.11 Mil/uL Final  . Hemoglobin 10/29/2013 11.6* 12.0 - 15.0 g/dL Final  . HCT 10/29/2013 34.9* 36.0 - 46.0 % Final  . MCV 10/29/2013 93.9  78.0 - 100.0 fl Final  . MCHC 10/29/2013 33.2  30.0 - 36.0 g/dL Final  . RDW 10/29/2013 14.8  11.5 - 15.5 % Final  . Platelets 10/29/2013 301.0  150.0 - 400.0 K/uL Final  . Neutrophils Relative % 10/29/2013 91.1* 43.0 - 77.0 % Final   smear reviewed  . Lymphocytes Relative 10/29/2013 4.9* 12.0 - 46.0 % Final  . Monocytes Relative 10/29/2013 3.6  3.0 - 12.0 % Final  . Eosinophils Relative  10/29/2013 0.1  0.0 - 5.0 % Final  . Basophils Relative 10/29/2013 0.3  0.0 - 3.0 % Final  . Neutro Abs 10/29/2013 14.6* 1.4 - 7.7 K/uL Final  . Lymphs Abs 10/29/2013 0.8  0.7 - 4.0 K/uL Final  . Monocytes Absolute 10/29/2013 0.6  0.1 - 1.0 K/uL Final  . Eosinophils Absolute 10/29/2013 0.0  0.0 - 0.7 K/uL Final  . Basophils Absolute 10/29/2013 0.0  0.0 - 0.1 K/uL Final  Admission on 10/11/2013, Discharged on 10/20/2013  No results displayed because visit has over 200 results.    There may be more visits with results that are not included.   Assessment/Plan  1. Hypoxemia She wants to stop O2. Previous attempts have only led to desaturation soon afterwards. We will try again to wean here off O2 since she is breeathing easier and the edema is improved.  2. Persistent atrial fibrillation Cardiology will reconsider cardioversion once K+ is normal and stable  3. Hypokalemia Continue current meds. -BMP, future  4. Essential hypertension controlled  5. Edema improved

## 2013-12-23 NOTE — Anesthesia Preprocedure Evaluation (Deleted)
Anesthesia Evaluation  Patient identified by MRN, date of birth, ID band Patient awake    Reviewed: Allergy & Precautions, H&P , NPO status , Patient's Chart, lab work & pertinent test results  Airway Mallampati: II       Dental   Pulmonary shortness of breath,  breath sounds clear to auscultation        Cardiovascular hypertension, + dysrhythmias Atrial Fibrillation Rhythm:Irregular Rate:Normal     Neuro/Psych    GI/Hepatic   Endo/Other    Renal/GU      Musculoskeletal   Abdominal   Peds  Hematology  (+) anemia ,   Anesthesia Other Findings   Reproductive/Obstetrics                             Anesthesia Physical Anesthesia Plan  ASA: III  Anesthesia Plan: MAC   Post-op Pain Management:    Induction: Intravenous  Airway Management Planned: Natural Airway and Simple Face Mask  Additional Equipment:   Intra-op Plan:   Post-operative Plan:   Informed Consent: I have reviewed the patients History and Physical, chart, labs and discussed the procedure including the risks, benefits and alternatives for the proposed anesthesia with the patient or authorized representative who has indicated his/her understanding and acceptance.     Plan Discussed with: CRNA and Surgeon  Anesthesia Plan Comments:         Anesthesia Quick Evaluation

## 2013-12-23 NOTE — Telephone Encounter (Signed)
Please find out what dose of potassium patient takes from her assisted living facility and when she took it today

## 2013-12-23 NOTE — Telephone Encounter (Signed)
Terry st that 100 mg potassium is documented given at 8AM this morning.

## 2013-12-23 NOTE — Telephone Encounter (Signed)
Spoke with Nurse Coralyn Mark.  Instructed her to give 40 mEq Kdur now and to repeat BMET in AM.  Coralyn Mark st their NP will be there in the AM for further orders on potassium supplements going forward.

## 2013-12-23 NOTE — Telephone Encounter (Signed)
Please have her take an extra 82meq of Kdur now and then repeat BMET in am - they need to call her PCP in am for further orders on potassium suppl going forward

## 2013-12-23 NOTE — Telephone Encounter (Signed)
Spoke with Kent Narrows, RN - DCCV has been rescheduled for 12/28/13 - patient is to arrive at 11:30am for the 1:30pm procedure.  Labs must be done at [facility] the day before.  States Randell Loop actually come to their facility or picks up from the facility and will have done 12/27/13.  Instructions with the above info faxed to [facility] [phone].

## 2013-12-23 NOTE — H&P (View-Only) (Signed)
12/21/2013 Julie Haas   [birth year]  [id number]  Primary Physicia GREEN, Viviann Spare, MD Primary Cardiologist: Dr Percival Spanish  HPI:  80 year old with paroxysmal atrial fibrillation on Eliquis, previously hospitalized from 09/24/13 with pneumonia/A. fib with RVR, cardioverted on 10/01/13 successfully however reverted back to atrial fibrillation on 10/05/13. She is currently living at [facility]. She was admitted with respiratory failure 11/09/13. She was hospitalized till 11/22/13. TEE during that admission showed LA smoke. Amiodarone was added with plans for eventual cardioversion. She was seen by Cecilie Kicks 12/06/13 and noted to be volume overloaded. Her wgt was 203. Metolazone 2.5 mg Tues/ Friday was added. She is here now for follow up. Her wgt is down to 173. She feels better. Bun/Scr were 35/1.0 on lab drawn 12/17/13.    Current Outpatient Prescriptions  Medication Sig Dispense Refill  . albuterol (PROVENTIL) (2.5 MG/3ML) 0.083% nebulizer solution Take 2.5 mg by nebulization every 6 (six) hours as needed for wheezing or shortness of breath.    Marland Kitchen amiodarone (PACERONE) 400 MG tablet Take 1 tablet (400 mg total) by mouth 2 (two) times daily. (Patient taking differently: Take 400 mg by mouth daily. ) 60 tablet 0  . apixaban (ELIQUIS) 5 MG TABS tablet Take 1 tablet (5 mg total) by mouth 2 (two) times daily. 60 tablet 11  . furosemide (LASIX) 80 MG tablet Take 80 mg by mouth 2 (two) times daily.    Marland Kitchen guaiFENesin-dextromethorphan (ROBITUSSIN DM) 100-10 MG/5ML syrup Take 10 mLs by mouth every 6 (six) hours as needed for cough.    Marland Kitchen ipratropium (ATROVENT) 0.02 % nebulizer solution Inhale 0.2 mg into the lungs as needed.  0  . levalbuterol (XOPENEX) 0.63 MG/3ML nebulizer solution Take 3 mLs (0.63 mg total) by nebulization every 4 (four) hours as needed for wheezing or shortness of breath. 3 mL 12  . metoprolol (LOPRESSOR) 50 MG tablet Take 1 tablet (50 mg total) by mouth 2 (two) times daily. 60  tablet 0  . potassium chloride SA (KLOR-CON M20) 20 MEQ tablet Take 100 mEq by mouth daily.     . sertraline (ZOLOFT) 25 MG tablet Take 0.5 tablets (12.5 mg total) by mouth daily. 30 tablet 0  . vitamin E 1000 UNIT capsule Take 1,000 Units by mouth daily.     No current facility-administered medications for this visit.    Allergies  Allergen Reactions  . Codeine     Unknown  . Cucumber Extract Nausea And Vomiting  . Diuretic [Buchu-Cornsilk-Ch Grass-Hydran]     Unknown  . Lipitor [Atorvastatin Calcium]     Unknown  . Penicillins Other (See Comments)    unknown  . Pravachol     Unknown    History   Social History  . Marital Status: Widowed    Spouse Name: N/A    Number of Children: N/A  . Years of Education: N/A   Occupational History  . Housewife    Social History Main Topics  . Smoking status: Never Smoker   . Smokeless tobacco: Never Used  . Alcohol Use: No  . Drug Use: No  . Sexual Activity: No   Other Topics Concern  . Not on file   Social History Narrative   The patient is widowed, husband died 03/24/2011.  She has two children and two     grandchildren.     She never smoked cigarettes    Does not drink alcohol.    Drinks 2 cups caffeine coffee,  L[facility] at [facility] since 09/03/2004   Living Will   Exercise: walks daily           Review of Systems: General: negative for chills, fever, night sweats or weight changes.  Cardiovascular: negative for chest pain, dyspnea on exertion, edema, orthopnea, palpitations, paroxysmal nocturnal dyspnea or shortness of breath Dermatological: negative for rash Respiratory: negative for cough or wheezing Urologic: negative for hematuria Abdominal: negative for nausea, vomiting, diarrhea, bright red blood per rectum, melena, or hematemesis Neurologic: negative for visual changes, syncope, or dizziness All other systems reviewed and are otherwise negative except as noted above.    Blood pressure 92/78, pulse  111, height 5\' 4"  (1.626 m), weight 173 lb 3.2 oz (78.563 kg).  General appearance: alert, cooperative, no distress and in wheel chair Lungs: clear to auscultation bilaterally Heart: irregularly irregular rhythm Extremities: trace edema  EKG AF with VR 110  ASSESSMENT AND PLAN:   Acute diastolic heart failure Normal LVF by echo Aug 2015. Metolazone 12/06/13 ago and her wgt is down 20lbs ! (173).   Atrial fibrillation with rapid ventricular response She remains in AF. She has been on Amiodarone 400 mg BID since 11/22/13  Atrial thrombus on TEE 11/15/13 She has been on Eliquis 5 mg BID    PLAN  Her CHF is improved, she may actually be a little over diuresed. I will decrease her Amiodarone to 400 mg daily, (started 11/22/13). She should stop her metolazone. I spoke with Dr Claiborne Billings who was in the office today. He felt we should repeat her TEE with cardioversion to document resolution of her LA thrombus. The pt is agreeable to this. She'll need labs pre cardioversion as her K+ has been running consistently low.   Karlie Aung KPA-C 12/21/2013 2:23 PM

## 2013-12-23 NOTE — Progress Notes (Signed)
Patient noted to have a potassium level of 3.1 this am.  Apparently labs were ordered on 11/3 but never done.  Will cancel TEE/DCCV and replete potassium . She will be rescheduled for TEE/DCCV another day with labs the day before.  Kerin Ransom PA-C will talk with [place] to see what potassium does patient is on and if she has been taking it and adjust the dose.

## 2013-12-24 ENCOUNTER — Encounter: Payer: Self-pay | Admitting: Nurse Practitioner

## 2013-12-24 ENCOUNTER — Non-Acute Institutional Stay (SKILLED_NURSING_FACILITY): Payer: Medicare Other | Admitting: Nurse Practitioner

## 2013-12-24 DIAGNOSIS — I482 Chronic atrial fibrillation, unspecified: Secondary | ICD-10-CM

## 2013-12-24 DIAGNOSIS — Z7901 Long term (current) use of anticoagulants: Secondary | ICD-10-CM

## 2013-12-24 DIAGNOSIS — F329 Major depressive disorder, single episode, unspecified: Secondary | ICD-10-CM

## 2013-12-24 DIAGNOSIS — J189 Pneumonia, unspecified organism: Secondary | ICD-10-CM

## 2013-12-24 DIAGNOSIS — I10 Essential (primary) hypertension: Secondary | ICD-10-CM

## 2013-12-24 DIAGNOSIS — D638 Anemia in other chronic diseases classified elsewhere: Secondary | ICD-10-CM

## 2013-12-24 DIAGNOSIS — R609 Edema, unspecified: Secondary | ICD-10-CM

## 2013-12-24 DIAGNOSIS — E876 Hypokalemia: Secondary | ICD-10-CM

## 2013-12-24 DIAGNOSIS — R0902 Hypoxemia: Secondary | ICD-10-CM

## 2013-12-24 DIAGNOSIS — I5031 Acute diastolic (congestive) heart failure: Secondary | ICD-10-CM

## 2013-12-24 DIAGNOSIS — F32A Depression, unspecified: Secondary | ICD-10-CM

## 2013-12-24 LAB — BASIC METABOLIC PANEL
BUN: 44 mg/dL — AB (ref 4–21)
Creatinine: 1.3 mg/dL — AB (ref 0.5–1.1)
Glucose: 99 mg/dL
Potassium: 3.8 mmol/L (ref 3.4–5.3)
Sodium: 140 mmol/L (ref 137–147)

## 2013-12-24 NOTE — Assessment & Plan Note (Signed)
10/05/13 EKG [facility] Afib-vent rate 100-110.  11/30/13 HR 90s. Continued Eliquis 5mg  bid.  12/03/13 EKG vent rate 90s. QT/QTC 384/431 12/21/13 Cardiology decreased Amiodarone 200mg  daily. Dc Metolazone  12/28/13 TEE cardioversion.

## 2013-12-24 NOTE — Assessment & Plan Note (Signed)
12/01/13 K 3.4 12/02/13 K 3.0 12/03/13 Kcl 69meq daily, 80mg  T +F with Metolazone since 12/06/13 12/06/13 K 3.5  12/13/13 K 3.3-continue Kcl 73meq daily 12/17/13 K 3.3-increase Kcl to 144meq qd 12/23/13 K 3.1-addition Kcl 62meq x1 12/24/13 K 3.8

## 2013-12-24 NOTE — Assessment & Plan Note (Signed)
Controlled.  

## 2013-12-24 NOTE — Progress Notes (Signed)
Patient ID: Julie Haas, female   DOB: 1925/08/08, 80 y.o.   MRN: [id number]    Code Status: DNR, living will.   Allergies  Allergen Reactions  . Codeine     Unknown  . Cucumber Extract Nausea And Vomiting  . Diuretic [Buchu-Cornsilk-Ch Grass-Hydran]     Unknown  . Lipitor [Atorvastatin Calcium]     Unknown  . Penicillins Other (See Comments)    unknown  . Pravachol     Unknown    Chief Complaint  Patient presents with  . Medical Management of Chronic Issues  . Acute Visit    hypokalemia.     HPI: Patient is a 80 y.o. female seen in the SNF at [facility] today for evaluation of hypokalemia and other chronic medical conditions.     Hospitalized 11/09/13-11/22/13 Acute respiratory failure with hypoxia HTN (hypertension) Hyperlipidemia Atrial fibrillation with rapid ventricular response Acute diastolic heart failure HCAP (healthcare-associated pneumonia) Lung nodule Hypokalemia Depression Respiratory distress    Hospitalized 10/11/2013-10/20/2013 for HCAP (healthcare-associated pneumonia) Atrial fibrillation Acute diastolic heart failure Hypoxemia Acute respiratory failure with hypoxia.  The patient had recent admission to hospital for PNA s/p TEE/DCCV (discharged on 8/15) and presented to [facility] ED with main concern of progressively worsening shortness of breath, mostly exertional but occasionally present at rest. Associated with productive cough of yellow sputum, subjective fevers, chills, LE edema and 2 pillow orthopnea      Hospitalized 09/24/2013-10/02/2013 for Acute diastolic heart failure HTN (hypertension) Hyperlipidemia Wound of left leg(since Nov 2014-f/u [place]) Bilateral moderate carotid disease by doppler Atrial fibrillation-conversion 10/01/13 Mild aortic stenosis with normal LVF Pneumonia(Levaquin 750mg  daily) Acute resp failure     Presented ED with SOB. She had a prior history of PVCs but no confirmed AF per her recollection. She was noted in AF with rates  in the >150 range. She was given diltiazem en route. In the ED, she was given additional diltiazem and placed on an infusion with better control of her HR in the 110s-120s. She was admitted to hospital for AF-RVR and continue on IV dilt. IV heparin added but later changed to Eliquis. She was given 20mg  of IV lasix for volume overload. She ultimately diuresed -14L net. TEE/DCCV was completed on Aug 14. Fortaz and vancomycin started Monday, Aug 10 for PNA. WBC decreased. She was changed to PO Levaquin 750 mg a day x 5 days(dc 10/07/13)   Problem List Items Addressed This Visit    Hypoxemia (Chronic)    O2 dependent, O2 sat 90%    Hypokalemia (Chronic)    12/01/13 K 3.4 12/02/13 K 3.0 12/03/13 Kcl 68meq daily, 80mg  T +F with Metolazone since 12/06/13 12/06/13 K 3.5  12/13/13 K 3.3-continue Kcl 10meq daily 12/17/13 K 3.3-increase Kcl to 192meq qd 12/23/13 K 3.1-addition Kcl 96meq x1 12/24/13 K 3.8     HTN (hypertension) (Chronic)    Controlled.        HCAP (healthcare-associated pneumonia)   Edema (Chronic)    Improved. Only trace edema BLE    Depression    11/05/13 Sertraline 25mg  daily.  12/24/13 positive response.      Atrial fibrillation - Primary (Chronic)    10/05/13 EKG SNF [facility] Afib-vent rate 100-110.  11/30/13 HR 90s. Continued Eliquis 5mg  bid.  12/03/13 EKG vent rate 90s. QT/QTC 384/431 12/21/13 Cardiology decreased Amiodarone 200mg  daily. Dc Metolazone  12/28/13 TEE cardioversion.      Anemia of chronic disease    11/30/13 Hgb 9.7  Adequate anticoagulation on anticoagulant therapy    No bleeding.  Continue eliquis     Acute diastolic heart failure    Normal LVF by echo Aug 2015 12/21/13 Cardiology: dc Metolazone. Decreased Amiodarone to 200mg  daily.  12/24/13 only trace edema BLE        Review of Systems:  Review of Systems  Constitutional: Negative for fever, chills, weight loss, malaise/fatigue and diaphoresis.  HENT: Positive for hearing loss. Negative  for congestion, ear discharge, ear pain, nosebleeds, sore throat and tinnitus.   Eyes: Negative for blurred vision, double vision, photophobia, pain, discharge and redness.  Respiratory: Positive for cough. Negative for hemoptysis, sputum production, shortness of breath, wheezing and stridor.        Hacking  Cardiovascular: Positive for leg swelling and PND. Negative for chest pain, palpitations, orthopnea and claudication.       BLE L>R. trace  Gastrointestinal: Negative for heartburn, nausea, vomiting, abdominal pain, diarrhea, constipation, blood in stool and melena.  Genitourinary: Negative for dysuria, urgency, frequency, hematuria and flank pain.  Musculoskeletal: Positive for falls. Negative for myalgias, back pain, joint pain and neck pain.       Assisted to floor when transferring to toilet-no apparent injury.   Skin: Negative for itching and rash.       Left lower leg wound since Nov 2014-healed.   Neurological: Negative for dizziness, tingling, tremors, sensory change, speech change, focal weakness, seizures, loss of consciousness, weakness and headaches.  Endo/Heme/Allergies: Negative for environmental allergies and polydipsia. Does not bruise/bleed easily.  Psychiatric/Behavioral: Positive for depression. Negative for suicidal ideas, hallucinations, memory loss and substance abuse. The patient is nervous/anxious. The patient does not have insomnia.        Slept better since Furosemide 80mg  bid 10/29/13     Past Medical History  Diagnosis Date  . Hyperlipidemia   . Arrhythmia     PVC  . Retinal detachment   . Renal disorder   . Allergy   . Occlusion and stenosis of carotid artery without mention of cerebral infarction 03/24/2012  . Pneumonia, organism unspecified 11/192013  . Depressive disorder, not elsewhere classified 03/05/2011  . Other premature beats 03/05/2011  . Other seborrheic keratosis 11/20/2010  . Syncope and collapse 10/02/2010  . Palpitations 11/21/2009  .  Unspecified essential hypertension 06/27/2009  . Cardiomegaly 06/27/2009  . Other generalized ischemic cerebrovascular disease 06/27/2009  . Dizziness and giddiness 06/27/2009  . Unspecified vitamin D deficiency 05/16/2009  . Family history of sudden cardiac death (SCD) December 01, 2008  . Rash and other nonspecific skin eruption 07/05/2008  . Edema 03/15/2008  . Obesity, unspecified 07/25/2006  . Varicose veins of lower extremities with inflammation 03/16/2003  . Shortness of breath    Past Surgical History  Procedure Laterality Date  . Cholecystectomy  1983  . Cataract extraction Bilateral     Dr. [name]  . Tonsillectomy    . Keratosis    . Retinal detachment surgery Left   . Cardioversion N/A 10/01/2013    Procedure: CARDIOVERSION;  Surgeon: Sanda Klein, MD;  Location: [facility] ENDOSCOPY;  Service: Cardiovascular;  Laterality: N/A;  . Tee without cardioversion N/A 10/01/2013    Procedure: TRANSESOPHAGEAL ECHOCARDIOGRAM (TEE);  Surgeon: Sanda Klein, MD;  Location: [facility] ENDOSCOPY;  Service: Cardiovascular;  Laterality: N/A;  . Tee without cardioversion N/A 11/15/2013    Procedure: TRANSESOPHAGEAL ECHOCARDIOGRAM (TEE);  Surgeon: Fay Records, MD;  Location: [facility] ENDOSCOPY;  Service: Cardiovascular;  Laterality: N/A;   Social History:   reports that she has  never smoked. She has never used smokeless tobacco. She reports that she does not drink alcohol or use illicit drugs.  Family History  Problem Relation Age of Onset  . Arrhythmia      Long QT in first degree relatives,  . Heart attack Father   . Heart disease Father   . Heart attack Brother   . Heart disease Brother   . Pneumonia Mother     Medications: Patient's Medications  New Prescriptions   No medications on file  Previous Medications   ALBUTEROL (PROVENTIL) (2.5 MG/3ML) 0.083% NEBULIZER SOLUTION    Take 2.5 mg by nebulization every 6 (six) hours as needed for wheezing or shortness of breath.   AMIODARONE (PACERONE) 200 MG TABLET     Take 200 mg by mouth daily.   APIXABAN (ELIQUIS) 5 MG TABS TABLET    Take 1 tablet (5 mg total) by mouth 2 (two) times daily.   FUROSEMIDE (LASIX) 80 MG TABLET    Take 80 mg by mouth 2 (two) times daily.   GUAIFENESIN-DEXTROMETHORPHAN (ROBITUSSIN DM) 100-10 MG/5ML SYRUP    Take 10 mLs by mouth every 6 (six) hours as needed for cough.   IPRATROPIUM (ATROVENT) 0.02 % NEBULIZER SOLUTION    Inhale 0.2 mg into the lungs every 4 (four) hours as needed for wheezing or shortness of breath.    LEVALBUTEROL (XOPENEX) 0.63 MG/3ML NEBULIZER SOLUTION    Take 3 mLs (0.63 mg total) by nebulization every 4 (four) hours as needed for wheezing or shortness of breath.   METOPROLOL (LOPRESSOR) 50 MG TABLET    Take 1 tablet (50 mg total) by mouth 2 (two) times daily.   MULTIPLE VITAMINS-MINERALS (PRESERVISION AREDS) CAPS    Take 1 capsule by mouth 2 (two) times daily.   POTASSIUM CHLORIDE SA (KLOR-CON M20) 20 MEQ TABLET    Take 100 mEq by mouth daily.    SERTRALINE (ZOLOFT) 25 MG TABLET    Take 0.5 tablets (12.5 mg total) by mouth daily.   VITAMIN E 1000 UNIT CAPSULE    Take 1,000 Units by mouth daily.  Modified Medications   No medications on file  Discontinued Medications   No medications on file     Physical Exam: Physical Exam  Constitutional:  overwewight  HENT:  Right Ear: External ear normal.  Left Ear: External ear normal.  Nose: Nose normal.  Eyes:  Corrective lenses.  Neck: Normal range of motion. Neck supple. No JVD present. No tracheal deviation present. No thyromegaly present.  Cardiovascular: Intact distal pulses.  Exam reveals no gallop and no friction rub.   Murmur heard. Varicose leg veins. 2+ edema LLE>RLE. HR 90s irregular. Systolic murmur apex of heart 2-3/6 noted.   Pulmonary/Chest: No respiratory distress. She has no wheezes. She has rales (bronchial rattle).  Moist rales posterior lower lungs.   Abdominal: Soft. Bowel sounds are normal. She exhibits no distension and no mass.  There is no tenderness.  Musculoskeletal: Normal range of motion. She exhibits edema. She exhibits no tenderness.  1+edema BLE L>R  Lymphadenopathy:    She has no cervical adenopathy.  Neurological: She is alert. No cranial nerve deficit. Coordination normal.  Skin: No rash noted. No erythema. No pallor.  Open area is about 76mm by 14 mm. Some swelling of the leg above this. Small hematoma at superior end of the injury.  Psychiatric: Her behavior is normal. Judgment and thought content normal.  Depressive and anxious mood.     Filed Vitals:  12/24/13 1241  BP: 100/78  Pulse: 98  Temp: 96.3 F (35.7 C)  TempSrc: Tympanic  Resp: 18      Labs reviewed: Basic Metabolic Panel:  Recent Labs  09/24/13 0834  10/12/13 0404  11/12/13 0520  11/20/13 9379 11/21/13 0328 11/22/13 0306  12/13/13 12/17/13 12/23/13 0935 12/24/13  NA  --   < > 137  < > 135*  < > 137 131* 134*  < > 140 142 138 140  K  --   < > 3.5*  < > 2.7*  < > 4.7 4.3 4.4  < > 3.3* 3.3* 3.1* 3.8  CL  --   < > 95*  < > 82*  < > 94* 86* 90*  --   --   --   --   --   CO2  --   < > 27  < > 42*  < > 36* 35* 36*  --   --   --   --   --   GLUCOSE  --   < > 92  < > 88  < > 102* 86 92  --   --   --  99  --   BUN  --   < > 13  < > 19  < > 17 18 20   < > 32* 35*  --  44*  CREATININE  --   < > 0.60  < > 0.63  < > 0.58 0.70 0.69  < > 0.9 1.0  --  1.3*  CALCIUM  --   < > 7.9*  < > 8.1*  < > 8.9 8.2* 8.2*  --   --   --   --   --   MG  --   --  1.7  --  1.6  --   --   --   --   --   --   --   --   --   TSH 2.580  --   --   --   --   --   --   --   --   --   --   --   --   --   < > = values in this interval not displayed. Liver Function Tests:  Recent Labs  11/09/13 0658 11/14/13 0410 11/17/13 0322  AST 23 28 21   ALT 18 24 19   ALKPHOS 80 68 70  BILITOT 1.0 0.4 0.6  PROT 7.5 6.5 6.9  ALBUMIN 2.9* 2.5* 2.6*   No results for input(s): LIPASE, AMYLASE in the last 8760 hours. No results for input(s): AMMONIA in the last 8760  hours. CBC:  Recent Labs  11/17/13 0322 11/18/13 0435 11/19/13 0253 11/30/13 12/23/13 0935  WBC 8.4 9.2 10.2 9.1  --   NEUTROABS 6.0 5.9 6.4  --   --   HGB 10.9* 10.5* 10.9* 9.7* 13.6  HCT 32.6* 31.8* 31.0* 29* 40.0  MCV 92.9 90.6 89.6  --   --   PLT 491* 427* 442* 360  --    Lipid Panel:  Recent Labs  03/22/13 09/25/13 0321  CHOL 229* 191  HDL 63 86  LDLCALC 148 95  TRIG 92 48  CHOLHDL  --  2.2    Past Procedures:  09/30/13 CXR  IMPRESSION: No active cardiopulmonary disease.  10/01/13   Echocardiogram transesophageal  LV EF: 55% -   60%  10/11/13 CT chest:  IMPRESSION:  Patchy bilateral airspace process with nodularity most prominent  over the right upper lobe likely due to infection. Patchy  bronchiectatic change as described. Small bilateral pleural  effusions. 1.3 cm pretracheal lymph node likely reactive. Recommend  followup CT in 6-8 weeks to re-evaluate the areas of nodularity.  Cardiomegaly with calcification of the mitral valve annulus and left  atrial enlargement. Atherosclerotic coronary artery disease. Small  amount of pericardial fluid.  1.1 cm liver cyst unchanged.  11/17/13 CXR   IMPRESSION:  Mild bilateral pleural effusions are noted with left greater than  right. Stable bilateral lung opacities are noted consistent with  pneumonia or edema, with right worse than left.      Assessment/Plan Atrial fibrillation 10/05/13 EKG SN[facility] Afib-vent rate 100-110.  11/30/13 HR 90s. Continued Eliquis 5mg  bid.  12/03/13 EKG vent rate 90s. QT/QTC 384/431 12/21/13 Cardiology decreased Amiodarone 200mg  daily. Dc Metolazone  12/28/13 TEE cardioversion.    Acute diastolic heart failure Normal LVF by echo Aug 2015 12/21/13 Cardiology: dc Metolazone. Decreased Amiodarone to 200mg  daily.  12/24/13 only trace edema BLE   Adequate anticoagulation on anticoagulant therapy No bleeding.  Continue eliquis   Anemia of chronic  disease 11/30/13 Hgb 9.7   Depression 11/05/13 Sertraline 25mg  daily.  12/24/13 positive response.    Hypoxemia O2 dependent, O2 sat 90%  Hypokalemia 12/01/13 K 3.4 12/02/13 K 3.0 12/03/13 Kcl 48meq daily, 80mg  T +F with Metolazone since 12/06/13 12/06/13 K 3.5  12/13/13 K 3.3-continue Kcl 52meq daily 12/17/13 K 3.3-increase Kcl to 175meq qd 12/23/13 K 3.1-addition Kcl 80meq x1 12/24/13 K 3.8   HTN (hypertension) Controlled.      Edema Improved. Only trace edema BLE    Family/ Staff Communication: observe the patient.   Goals of Care: AL  Labs/tests ordered: BMP

## 2013-12-24 NOTE — Assessment & Plan Note (Signed)
11/05/13 Sertraline 25mg  daily.  12/24/13 positive response.

## 2013-12-24 NOTE — Assessment & Plan Note (Signed)
Improved. Only trace edema BLE

## 2013-12-24 NOTE — Assessment & Plan Note (Signed)
No bleeding.  Continue eliquis

## 2013-12-24 NOTE — Assessment & Plan Note (Signed)
Normal LVF by echo Aug 2015 12/21/13 Cardiology: dc Metolazone. Decreased Amiodarone to 200mg  daily.  12/24/13 only trace edema BLE

## 2013-12-24 NOTE — Assessment & Plan Note (Deleted)
Improved. Only trace edema BLE

## 2013-12-24 NOTE — Assessment & Plan Note (Signed)
O2 dependent, O2 sat 90%

## 2013-12-24 NOTE — Assessment & Plan Note (Signed)
11/30/13 Hgb 9.7

## 2013-12-27 LAB — BASIC METABOLIC PANEL
BUN: 78 mg/dL — AB (ref 4–21)
CREATININE: 1 mg/dL (ref 0.5–1.1)
GLUCOSE: 78 mg/dL
Potassium: 3.1 mmol/L — AB (ref 3.4–5.3)
SODIUM: 139 mmol/L (ref 137–147)

## 2013-12-28 ENCOUNTER — Ambulatory Visit (HOSPITAL_COMMUNITY): Admission: RE | Admit: 2013-12-28 | Payer: Medicare Other | Source: Ambulatory Visit | Admitting: Cardiology

## 2013-12-28 ENCOUNTER — Non-Acute Institutional Stay (SKILLED_NURSING_FACILITY): Payer: Medicare Other | Admitting: Nurse Practitioner

## 2013-12-28 ENCOUNTER — Encounter (HOSPITAL_COMMUNITY): Admission: RE | Payer: Self-pay | Source: Ambulatory Visit

## 2013-12-28 ENCOUNTER — Encounter (HOSPITAL_COMMUNITY): Payer: Self-pay | Admitting: Anesthesiology

## 2013-12-28 ENCOUNTER — Encounter: Payer: Self-pay | Admitting: Nurse Practitioner

## 2013-12-28 ENCOUNTER — Telehealth: Payer: Self-pay | Admitting: Cardiology

## 2013-12-28 DIAGNOSIS — Z7901 Long term (current) use of anticoagulants: Secondary | ICD-10-CM

## 2013-12-28 DIAGNOSIS — J9601 Acute respiratory failure with hypoxia: Secondary | ICD-10-CM

## 2013-12-28 DIAGNOSIS — I10 Essential (primary) hypertension: Secondary | ICD-10-CM

## 2013-12-28 DIAGNOSIS — F329 Major depressive disorder, single episode, unspecified: Secondary | ICD-10-CM

## 2013-12-28 DIAGNOSIS — I5031 Acute diastolic (congestive) heart failure: Secondary | ICD-10-CM

## 2013-12-28 DIAGNOSIS — E876 Hypokalemia: Secondary | ICD-10-CM

## 2013-12-28 DIAGNOSIS — R609 Edema, unspecified: Secondary | ICD-10-CM

## 2013-12-28 DIAGNOSIS — I482 Chronic atrial fibrillation, unspecified: Secondary | ICD-10-CM

## 2013-12-28 DIAGNOSIS — D638 Anemia in other chronic diseases classified elsewhere: Secondary | ICD-10-CM

## 2013-12-28 DIAGNOSIS — F32A Depression, unspecified: Secondary | ICD-10-CM

## 2013-12-28 SURGERY — ECHOCARDIOGRAM, TRANSESOPHAGEAL
Anesthesia: Monitor Anesthesia Care

## 2013-12-28 NOTE — CV Procedure (Signed)
The patient didn't show up for the procedure.  Julie Haas 12/28/2013

## 2013-12-28 NOTE — Assessment & Plan Note (Signed)
SOB, O2 dependent, afebrile. 12/01/13 CXR showed multiple bilateral pneumonia-completed Avelox 400mg  qd x 2 weeks started 12/02/13. At her baseline.

## 2013-12-28 NOTE — H&P (View-Only) (Signed)
Patient ID: Julie Haas, female   DOB: Jan 18, 1926, 80 y.o.   MRN: [id number]    [facility] Room Number: N31  Place of Service: SNF (31) OFFICE   Allergies  Allergen Reactions  . Codeine     Unknown  . Cucumber Extract Nausea And Vomiting  . Diuretic [Buchu-Cornsilk-Ch Grass-Hydran]     Unknown  . Lipitor [Atorvastatin Calcium]     Unknown  . Penicillins Other (See Comments)    unknown  . Pravachol     Unknown    Chief Complaint  Patient presents with  . Medical Management of Chronic Issues    AF, hypoxia, hypokalemia    HPI:  Was scheduled to have electrical cardioversion today, but it was cancelled due to K+ 3.1. She is disappointed. AF rate is controlled at present.  She is much improved in the dyspnea and edema of lower legs since I last saw her The metolazone was stopped 12/21/13. She is on 139mEq of KCL daily. She remains on high dose Lasix.  Medications: Patient's Medications  New Prescriptions   No medications on file  Previous Medications   ALBUTEROL (PROVENTIL) (2.5 MG/3ML) 0.083% NEBULIZER SOLUTION    Take 2.5 mg by nebulization every 6 (six) hours as needed for wheezing or shortness of breath.   AMIODARONE (PACERONE) 200 MG TABLET    Take 200 mg by mouth daily.   APIXABAN (ELIQUIS) 5 MG TABS TABLET    Take 1 tablet (5 mg total) by mouth 2 (two) times daily.   FUROSEMIDE (LASIX) 80 MG TABLET    Take 80 mg by mouth 2 (two) times daily.   GUAIFENESIN-DEXTROMETHORPHAN (ROBITUSSIN DM) 100-10 MG/5ML SYRUP    Take 10 mLs by mouth every 6 (six) hours as needed for cough.   IPRATROPIUM (ATROVENT) 0.02 % NEBULIZER SOLUTION    Inhale 0.2 mg into the lungs every 4 (four) hours as needed for wheezing or shortness of breath.    LEVALBUTEROL (XOPENEX) 0.63 MG/3ML NEBULIZER SOLUTION    Take 3 mLs (0.63 mg total) by nebulization every 4 (four) hours as needed for wheezing or shortness of breath.   METOPROLOL (LOPRESSOR) 50 MG TABLET    Take 1  tablet (50 mg total) by mouth 2 (two) times daily.   MULTIPLE VITAMINS-MINERALS (PRESERVISION AREDS) CAPS    Take 1 capsule by mouth 2 (two) times daily.   POTASSIUM CHLORIDE SA (KLOR-CON M20) 20 MEQ TABLET    Take 100 mEq by mouth daily.    SERTRALINE (ZOLOFT) 25 MG TABLET    Take 0.5 tablets (12.5 mg total) by mouth daily.   VITAMIN E 1000 UNIT CAPSULE    Take 1,000 Units by mouth daily.  Modified Medications   No medications on file  Discontinued Medications   No medications on file     Review of Systems  Constitutional: Positive for fatigue. Negative for fever, activity change and appetite change.  Eyes: Positive for visual disturbance (Corrective lenses).  Respiratory: Positive for cough and shortness of breath. Negative for chest tightness and wheezing.        Hypoxia; using O2  Cardiovascular: Positive for leg swelling. Negative for chest pain and palpitations.       AF  Gastrointestinal: Negative.   Endocrine: Negative.   Genitourinary: Negative.   Musculoskeletal: Negative.   Neurological: Positive for weakness.       Mild confusion  Hematological:       Anemia  Psychiatric/Behavioral: Positive for confusion.  Filed Vitals:   12/23/13 1641  BP: 112/88  Pulse: 80  Temp: 97 F (36.1 C)  Resp: 18  SpO2: 97%   There is no weight on file to calculate BMI.  Physical Exam  Constitutional:  overwewight  HENT:  Right Ear: External ear normal.  Left Ear: External ear normal.  Nose: Nose normal.  Eyes:  Corrective lenses.  Neck: Normal range of motion. Neck supple. No JVD present. No tracheal deviation present. No thyromegaly present.  Cardiovascular: Intact distal pulses.  Exam reveals no gallop and no friction rub.   Murmur heard. Varicose leg veins. 1+ edema LLE>RLE. HR 90s irregular. Systolic murmur apex of heart 2-3/6 noted.   Pulmonary/Chest: No respiratory distress. She has no wheezes. She has rales (bronchial rattle).  Moist rales posterior lower lungs.    Abdominal: Soft. Bowel sounds are normal. She exhibits no distension and no mass. There is no tenderness.  Musculoskeletal: Normal range of motion. She exhibits edema. She exhibits no tenderness.  1+edema BLE L>R Unstable gait  Lymphadenopathy:    She has no cervical adenopathy.  Neurological: She is alert. No cranial nerve deficit. Coordination normal.  Skin: No rash noted. No erythema. No pallor.  Open area is about 18mm by 14 mm. Some swelling of the leg above this. Small hematoma at superior end of the injury.  Psychiatric: Her behavior is normal. Judgment and thought content normal.  Depressive and anxious mood.      Labs reviewed: Admission on 12/23/2013, Discharged on 12/23/2013  Component Date Value Ref Range Status  . Sodium 12/23/2013 138  137 - 147 mEq/L Final  . Potassium 12/23/2013 3.1* 3.7 - 5.3 mEq/L Final  . Glucose, Bld 12/23/2013 99  70 - 99 mg/dL Final  . HCT 12/23/2013 40.0  36.0 - 46.0 % Final  . Hemoglobin 12/23/2013 13.6  12.0 - 15.0 g/dL Final  Lab on 12/17/2013  Component Date Value Ref Range Status  . Glucose 12/17/2013 81   Final  . BUN 12/17/2013 35* 4 - 21 mg/dL Final  . Creatinine 12/17/2013 1.0  0.5 - 1.1 mg/dL Final  . Potassium 12/17/2013 3.3* 3.4 - 5.3 mmol/L Final  . Sodium 12/17/2013 142  137 - 147 mmol/L Final  [facility]ursing Home on 12/14/2013  Component Date Value Ref Range Status  . Glucose 12/13/2013 70   Final  . BUN 12/13/2013 32* 4 - 21 mg/dL Final  . Creatinine 12/13/2013 0.9  0.5 - 1.1 mg/dL Final  . Potassium 12/13/2013 3.3* 3.4 - 5.3 mmol/L Final  . Sodium 12/13/2013 140  137 - 147 mmol/L Final  Lab on 12/07/2013  Component Date Value Ref Range Status  . Glucose 12/06/2013 127   Final  . BUN 12/06/2013 29* 4 - 21 mg/dL Final  . Creatinine 12/06/2013 0.8  0.5 - 1.1 mg/dL Final  . Potassium 12/06/2013 3.5  3.4 - 5.3 mmol/L Final  . Sodium 12/06/2013 138  137 - 147 mmol/L Final  Nursing Home on 12/03/2013  Component Date Value Ref  Range Status  . Glucose 12/01/2013 83   Final  . BUN 12/01/2013 15  4 - 21 mg/dL Final  . Creatinine 12/01/2013 0.7  0.5 - 1.1 mg/dL Final  . Potassium 12/01/2013 3.4  3.4 - 5.3 mmol/L Final  . Sodium 12/01/2013 137  137 - 147 mmol/L Final  . Glucose 12/02/2013 90   Final  . BUN 12/02/2013 14  4 - 21 mg/dL Final  . Creatinine 12/02/2013 0.7  0.5 - 1.1  mg/dL Final  . Potassium 12/02/2013 3.0* 3.4 - 5.3 mmol/L Final  . Sodium 12/02/2013 138  137 - 147 mmol/L Final  [facility]ursing Home on 11/30/2013  Component Date Value Ref Range Status  . Hemoglobin 11/30/2013 9.7* 12.0 - 16.0 g/dL Final  . HCT 11/30/2013 29* 36 - 46 % Final  . Platelets 11/30/2013 360  150 - 399 K/L Final  . WBC 11/30/2013 9.1   Final  Admission on 11/09/2013, Discharged on 11/22/2013  No results displayed because visit has over 200 results.    Nursing Home on 11/05/2013  Component Date Value Ref Range Status  . Glucose 11/03/2013 93   Final  . BUN 11/03/2013 17  4 - 21 mg/dL Final  . Creatinine 11/03/2013 0.6  0.5 - 1.1 mg/dL Final  . Potassium 11/03/2013 3.2* 3.4 - 5.3 mmol/L Final  . Sodium 11/03/2013 139  137 - 147 mmol/L Final  Appointment on 10/29/2013  Component Date Value Ref Range Status  . Sed Rate 10/29/2013 75* 0 - 22 mm/hr Final  . Pro B Natriuretic peptide (BNP) 10/29/2013 444.0* 0.0 - 100.0 pg/mL Final  . WBC 10/29/2013 16.1* 4.0 - 10.5 K/uL Final  . RBC 10/29/2013 3.72* 3.87 - 5.11 Mil/uL Final  . Hemoglobin 10/29/2013 11.6* 12.0 - 15.0 g/dL Final  . HCT 10/29/2013 34.9* 36.0 - 46.0 % Final  . MCV 10/29/2013 93.9  78.0 - 100.0 fl Final  . MCHC 10/29/2013 33.2  30.0 - 36.0 g/dL Final  . RDW 10/29/2013 14.8  11.5 - 15.5 % Final  . Platelets 10/29/2013 301.0  150.0 - 400.0 K/uL Final  . Neutrophils Relative % 10/29/2013 91.1* 43.0 - 77.0 % Final   smear reviewed  . Lymphocytes Relative 10/29/2013 4.9* 12.0 - 46.0 % Final  . Monocytes Relative 10/29/2013 3.6  3.0 - 12.0 % Final  . Eosinophils Relative  10/29/2013 0.1  0.0 - 5.0 % Final  . Basophils Relative 10/29/2013 0.3  0.0 - 3.0 % Final  . Neutro Abs 10/29/2013 14.6* 1.4 - 7.7 K/uL Final  . Lymphs Abs 10/29/2013 0.8  0.7 - 4.0 K/uL Final  . Monocytes Absolute 10/29/2013 0.6  0.1 - 1.0 K/uL Final  . Eosinophils Absolute 10/29/2013 0.0  0.0 - 0.7 K/uL Final  . Basophils Absolute 10/29/2013 0.0  0.0 - 0.1 K/uL Final  Admission on 10/11/2013, Discharged on 10/20/2013  No results displayed because visit has over 200 results.    There may be more visits with results that are not included.   Assessment/Plan  1. Hypoxemia She wants to stop O2. Previous attempts have only led to desaturation soon afterwards. We will try again to wean here off O2 since she is breeathing easier and the edema is improved.  2. Persistent atrial fibrillation Cardiology will reconsider cardioversion once K+ is normal and stable  3. Hypokalemia Continue current meds. -BMP, future  4. Essential hypertension controlled  5. Edema improved

## 2013-12-28 NOTE — Progress Notes (Signed)
Patient ID: Julie Haas, female   DOB: 11-22-1925, 80 y.o.   MRN: [id number]    Code Status: DNR, living will.   Allergies  Allergen Reactions  . Codeine     Unknown  . Cucumber Extract Nausea And Vomiting  . Diuretic [Buchu-Cornsilk-Ch Grass-Hydran]     Unknown  . Lipitor [Atorvastatin Calcium]     Unknown  . Penicillins Other (See Comments)    unknown  . Pravachol     Unknown    Chief Complaint  Patient presents with  . Medical Management of Chronic Issues  . Acute Visit    hypokalemia    HPI: Patient is a 80 y.o. female seen in the SNF at [facility] today for evaluation of hypokalemia, depression, and other chronic medical conditions.     Hospitalized 11/09/13-11/22/13 Acute respiratory failure with hypoxia HTN (hypertension) Hyperlipidemia Atrial fibrillation with rapid ventricular response Acute diastolic heart failure HCAP (healthcare-associated pneumonia) Lung nodule Hypokalemia Depression Respiratory distress    Hospitalized 10/11/2013-10/20/2013 for HCAP (healthcare-associated pneumonia) Atrial fibrillation Acute diastolic heart failure Hypoxemia Acute respiratory failure with hypoxia.  The patient had recent admission to hospital for PNA s/p TEE/DCCV (discharged on 8/15) and presented to [facility] ED with main concern of progressively worsening shortness of breath, mostly exertional but occasionally present at rest. Associated with productive cough of yellow sputum, subjective fevers, chills, LE edema and 2 pillow orthopnea      Hospitalized 09/24/2013-10/02/2013 for Acute diastolic heart failure HTN (hypertension) Hyperlipidemia Wound of left leg(since Nov 2014-f/u [facility]) Bilateral moderate carotid disease by doppler Atrial fibrillation-conversion 10/01/13 Mild aortic stenosis with normal LVF Pneumonia(Levaquin 750mg  daily) Acute resp failure     Presented ED with SOB. She had a prior history of PVCs but no confirmed AF per her recollection. She was noted in AF  with rates in the >150 range. She was given diltiazem en route. In the ED, she was given additional diltiazem and placed on an infusion with better control of her HR in the 110s-120s. She was admitted to hospital for AF-RVR and continue on IV dilt. IV heparin added but later changed to Eliquis. She was given 20mg  of IV lasix for volume overload. She ultimately diuresed -14L net. TEE/DCCV was completed on Aug 14. Fortaz and vancomycin started Monday, Aug 10 for PNA. WBC decreased. She was changed to PO Levaquin 750 mg a day x 5 days(dc 10/07/13)   Problem List Items Addressed This Visit    Hypokalemia (Chronic)    12/01/13 K 3.4 12/02/13 K 3.0 12/03/13 Kcl 24meq daily, 80mg  T +F with Metolazone since 12/06/13 12/06/13 K 3.5  12/13/13 K 3.3-continue Kcl 59meq daily 12/17/13 K 3.3-increase Kcl to 163meq qd 12/23/13 K 3.1-addition Kcl 53meq x1 12/24/13 K 3.8 12/27/13 K 3.1-Kcl 80mg  bid-BMP     HTN (hypertension) (Chronic)    Controlled.       Edema (Chronic)    Improved. Only trace edema BLE     Depression    11/05/13 Sertraline 25mg  daily.  12/28/13 dc Sertraline per POA's request-causing the patient's confusion.      Atrial fibrillation - Primary (Chronic)    10/05/13 EKG SNF [facility] Afib-vent rate 100-110.  11/15/13 EET cardioversion 11/30/13 HR 90s. Continued Eliquis 5mg  bid.  12/03/13 EKG vent rate 90s. QT/QTC 384/431 12/21/13 Cardiology decreased Amiodarone 200mg  daily. Dc Metolazone  12/28/13 TEE cardioversion-the patient declined.      Anemia of chronic disease    11/30/13 Hgb 9.7  Adequate anticoagulation on anticoagulant therapy    No bleeding.  Continue eliquis     Acute respiratory failure with hypoxia    SOB, O2 dependent, afebrile. 12/01/13 CXR showed multiple bilateral pneumonia-completed Avelox 400mg  qd x 2 weeks started 12/02/13. At her baseline.       Acute diastolic heart failure    Normal LVF by echo Aug 2015 12/21/13 Cardiology: dc Metolazone. Decreased  Amiodarone to 200mg  daily       Review of Systems:  Review of Systems  Constitutional: Negative for fever, chills, weight loss, malaise/fatigue and diaphoresis.  HENT: Positive for hearing loss. Negative for congestion, ear discharge, ear pain, nosebleeds, sore throat and tinnitus.   Eyes: Negative for blurred vision, double vision, photophobia, pain, discharge and redness.  Respiratory: Positive for cough. Negative for hemoptysis, sputum production, shortness of breath, wheezing and stridor.        Hacking  Cardiovascular: Positive for leg swelling and PND. Negative for chest pain, palpitations, orthopnea and claudication.       BLE L>R. trace  Gastrointestinal: Negative for heartburn, nausea, vomiting, abdominal pain, diarrhea, constipation, blood in stool and melena.  Genitourinary: Negative for dysuria, urgency, frequency, hematuria and flank pain.  Musculoskeletal: Positive for falls. Negative for myalgias, back pain, joint pain and neck pain.       Assisted to floor when transferring to toilet-no apparent injury.   Skin: Negative for itching and rash.       Left lower leg wound since Nov 2014-healed.   Neurological: Negative for dizziness, tingling, tremors, sensory change, speech change, focal weakness, seizures, loss of consciousness, weakness and headaches.  Endo/Heme/Allergies: Negative for environmental allergies and polydipsia. Does not bruise/bleed easily.  Psychiatric/Behavioral: Positive for depression. Negative for suicidal ideas, hallucinations, memory loss and substance abuse. The patient is nervous/anxious. The patient does not have insomnia.        Slept better since Furosemide 80mg  bid 10/29/13     Past Medical History  Diagnosis Date  . Hyperlipidemia   . Arrhythmia     PVC  . Retinal detachment   . Renal disorder   . Allergy   . Occlusion and stenosis of carotid artery without mention of cerebral infarction 03/24/2012  . Pneumonia, organism unspecified  11/192013  . Depressive disorder, not elsewhere classified 03/05/2011  . Other premature beats 03/05/2011  . Other seborrheic keratosis 11/20/2010  . Syncope and collapse 10/02/2010  . Palpitations 11/21/2009  . Unspecified essential hypertension 06/27/2009  . Cardiomegaly 06/27/2009  . Other generalized ischemic cerebrovascular disease 06/27/2009  . Dizziness and giddiness 06/27/2009  . Unspecified vitamin D deficiency 05/16/2009  . Family history of sudden cardiac death (SCD) 12/11/2008  . Rash and other nonspecific skin eruption 07/05/2008  . Edema 03/15/2008  . Obesity, unspecified 07/25/2006  . Varicose veins of lower extremities with inflammation 03/16/2003  . Shortness of breath    Past Surgical History  Procedure Laterality Date  . Cholecystectomy  1983  . Cataract extraction Bilateral     Dr. [name]  . Tonsillectomy    . Keratosis    . Retinal detachment surgery Left   . Cardioversion N/A 10/01/2013    Procedure: CARDIOVERSION;  Surgeon: Sanda Klein, MD;  Location: [facility] ENDOSCOPY;  Service: Cardiovascular;  Laterality: N/A;  . Tee without cardioversion N/A 10/01/2013    Procedure: TRANSESOPHAGEAL ECHOCARDIOGRAM (TEE);  Surgeon: Sanda Klein, MD;  Location: [facility] ENDOSCOPY;  Service: Cardiovascular;  Laterality: N/A;  . Tee without cardioversion N/A 11/15/2013    Procedure: TRANSESOPHAGEAL  ECHOCARDIOGRAM (TEE);  Surgeon: Fay Records, MD;  Location: [facility] ENDOSCOPY;  Service: Cardiovascular;  Laterality: N/A;   Social History:   reports that she has never smoked. She has never used smokeless tobacco. She reports that she does not drink alcohol or use illicit drugs.  Family History  Problem Relation Age of Onset  . Arrhythmia      Long QT in first degree relatives,  . Heart attack Father   . Heart disease Father   . Heart attack Brother   . Heart disease Brother   . Pneumonia Mother     Medications: Patient's Medications  New Prescriptions   No medications on file  Previous  Medications   ALBUTEROL (PROVENTIL) (2.5 MG/3ML) 0.083% NEBULIZER SOLUTION    Take 2.5 mg by nebulization every 6 (six) hours as needed for wheezing or shortness of breath.   AMIODARONE (PACERONE) 200 MG TABLET    Take 200 mg by mouth daily.   APIXABAN (ELIQUIS) 5 MG TABS TABLET    Take 1 tablet (5 mg total) by mouth 2 (two) times daily.   FUROSEMIDE (LASIX) 80 MG TABLET    Take 80 mg by mouth 2 (two) times daily.   GUAIFENESIN-DEXTROMETHORPHAN (ROBITUSSIN DM) 100-10 MG/5ML SYRUP    Take 10 mLs by mouth every 6 (six) hours as needed for cough.   IPRATROPIUM (ATROVENT) 0.02 % NEBULIZER SOLUTION    Inhale 0.2 mg into the lungs every 4 (four) hours as needed for wheezing or shortness of breath.    LEVALBUTEROL (XOPENEX) 0.63 MG/3ML NEBULIZER SOLUTION    Take 3 mLs (0.63 mg total) by nebulization every 4 (four) hours as needed for wheezing or shortness of breath.   METOPROLOL (LOPRESSOR) 50 MG TABLET    Take 1 tablet (50 mg total) by mouth 2 (two) times daily.   MULTIPLE VITAMINS-MINERALS (PRESERVISION AREDS) CAPS    Take 1 capsule by mouth 2 (two) times daily.   POTASSIUM CHLORIDE SA (KLOR-CON M20) 20 MEQ TABLET    Take 100 mEq by mouth daily.    SERTRALINE (ZOLOFT) 25 MG TABLET    Take 0.5 tablets (12.5 mg total) by mouth daily.   VITAMIN E 1000 UNIT CAPSULE    Take 1,000 Units by mouth daily.  Modified Medications   No medications on file  Discontinued Medications   No medications on file     Physical Exam: Physical Exam  Constitutional:  overwewight  HENT:  Right Ear: External ear normal.  Left Ear: External ear normal.  Nose: Nose normal.  Eyes:  Corrective lenses.  Neck: Normal range of motion. Neck supple. No JVD present. No tracheal deviation present. No thyromegaly present.  Cardiovascular: Intact distal pulses.  Exam reveals no gallop and no friction rub.   Murmur heard. Varicose leg veins. 2+ edema LLE>RLE. HR 90s irregular. Systolic murmur apex of heart 2-3/6 noted.     Pulmonary/Chest: No respiratory distress. She has no wheezes. She has rales (bronchial rattle).  Moist rales posterior lower lungs.   Abdominal: Soft. Bowel sounds are normal. She exhibits no distension and no mass. There is no tenderness.  Musculoskeletal: Normal range of motion. She exhibits edema. She exhibits no tenderness.  1+edema BLE L>R  Lymphadenopathy:    She has no cervical adenopathy.  Neurological: She is alert. No cranial nerve deficit. Coordination normal.  Skin: No rash noted. No erythema. No pallor.  Open area is about 26mm by 14 mm. Some swelling of the leg above this. Small hematoma at  superior end of the injury.  Psychiatric: Her behavior is normal. Judgment and thought content normal.  Depressive and anxious mood.     Filed Vitals:   12/28/13 0926  BP: 110/86  Pulse: 88  Temp: 98 F (36.7 C)  TempSrc: Tympanic  Resp: 20      Labs reviewed: Basic Metabolic Panel:  Recent Labs  09/24/13 0834  10/12/13 0404  11/12/13 0520  11/20/13 7425 11/21/13 0328 11/22/13 0306  12/17/13 12/23/13 0935 12/24/13 12/27/13  NA  --   < > 137  < > 135*  < > 137 131* 134*  < > 142 138 140 139  K  --   < > 3.5*  < > 2.7*  < > 4.7 4.3 4.4  < > 3.3* 3.1* 3.8 3.1*  CL  --   < > 95*  < > 82*  < > 94* 86* 90*  --   --   --   --   --   CO2  --   < > 27  < > 42*  < > 36* 35* 36*  --   --   --   --   --   GLUCOSE  --   < > 92  < > 88  < > 102* 86 92  --   --  99  --   --   BUN  --   < > 13  < > 19  < > 17 18 20   < > 35*  --  44* 78*  CREATININE  --   < > 0.60  < > 0.63  < > 0.58 0.70 0.69  < > 1.0  --  1.3* 1.0  CALCIUM  --   < > 7.9*  < > 8.1*  < > 8.9 8.2* 8.2*  --   --   --   --   --   MG  --   --  1.7  --  1.6  --   --   --   --   --   --   --   --   --   TSH 2.580  --   --   --   --   --   --   --   --   --   --   --   --   --   < > = values in this interval not displayed. Liver Function Tests:  Recent Labs  11/09/13 0658 11/14/13 0410 11/17/13 0322  AST 23 28 21    ALT 18 24 19   ALKPHOS 80 68 70  BILITOT 1.0 0.4 0.6  PROT 7.5 6.5 6.9  ALBUMIN 2.9* 2.5* 2.6*   No results for input(s): LIPASE, AMYLASE in the last 8760 hours. No results for input(s): AMMONIA in the last 8760 hours. CBC:  Recent Labs  11/17/13 0322 11/18/13 0435 11/19/13 0253 11/30/13 12/23/13 0935  WBC 8.4 9.2 10.2 9.1  --   NEUTROABS 6.0 5.9 6.4  --   --   HGB 10.9* 10.5* 10.9* 9.7* 13.6  HCT 32.6* 31.8* 31.0* 29* 40.0  MCV 92.9 90.6 89.6  --   --   PLT 491* 427* 442* 360  --    Lipid Panel:  Recent Labs  03/22/13 09/25/13 0321  CHOL 229* 191  HDL 63 86  LDLCALC 148 95  TRIG 92 48  CHOLHDL  --  2.2    Past Procedures:  09/30/13 CXR  IMPRESSION: No active cardiopulmonary disease.  10/01/13   Echocardiogram transesophageal                             LV EF: 55% -   60%  10/11/13 CT chest:  IMPRESSION:  Patchy bilateral airspace process with nodularity most prominent  over the right upper lobe likely due to infection. Patchy  bronchiectatic change as described. Small bilateral pleural  effusions. 1.3 cm pretracheal lymph node likely reactive. Recommend  followup CT in 6-8 weeks to re-evaluate the areas of nodularity.  Cardiomegaly with calcification of the mitral valve annulus and left  atrial enlargement. Atherosclerotic coronary artery disease. Small  amount of pericardial fluid.  1.1 cm liver cyst unchanged.  11/17/13 CXR   IMPRESSION:  Mild bilateral pleural effusions are noted with left greater than  right. Stable bilateral lung opacities are noted consistent with  pneumonia or edema, with right worse than left.      Assessment/Plan Atrial fibrillation 10/05/13 EKG SN[facility] Afib-vent rate 100-110.  11/15/13 EET cardioversion 11/30/13 HR 90s. Continued Eliquis 5mg  bid.  12/03/13 EKG vent rate 90s. QT/QTC 384/431 12/21/13 Cardiology decreased Amiodarone 200mg  daily. Dc Metolazone  12/28/13 TEE cardioversion-the patient declined.     Hypokalemia 12/01/13 K 3.4 12/02/13 K 3.0 12/03/13 Kcl 54meq daily, 80mg  T +F with Metolazone since 12/06/13 12/06/13 K 3.5  12/13/13 K 3.3-continue Kcl 27meq daily 12/17/13 K 3.3-increase Kcl to 13meq qd 12/23/13 K 3.1-addition Kcl 28meq x1 12/24/13 K 3.8 12/27/13 K 3.1-Kcl 80mg  bid-BMP   Depression 11/05/13 Sertraline 25mg  daily.  12/28/13 dc Sertraline per POA's request-causing the patient's confusion.    Acute diastolic heart failure Normal LVF by echo Aug 2015 12/21/13 Cardiology: dc Metolazone. Decreased Amiodarone to 200mg  daily  Acute respiratory failure with hypoxia SOB, O2 dependent, afebrile. 12/01/13 CXR showed multiple bilateral pneumonia-completed Avelox 400mg  qd x 2 weeks started 12/02/13. At her baseline.     Adequate anticoagulation on anticoagulant therapy No bleeding.  Continue eliquis   Anemia of chronic disease 11/30/13 Hgb 9.7    Edema Improved. Only trace edema BLE   HTN (hypertension) Controlled.       Family/ Staff Communication: observe the patient.   Goals of Care: AL  Labs/tests ordered: BMP

## 2013-12-28 NOTE — Assessment & Plan Note (Signed)
12/01/13 K 3.4 12/02/13 K 3.0 12/03/13 Kcl 71meq daily, 80mg  T +F with Metolazone since 12/06/13 12/06/13 K 3.5  12/13/13 K 3.3-continue Kcl 28meq daily 12/17/13 K 3.3-increase Kcl to 159meq qd 12/23/13 K 3.1-addition Kcl 66meq x1 12/24/13 K 3.8 12/27/13 K 3.1-Kcl 80mg  bid-BMP

## 2013-12-28 NOTE — Interval H&P Note (Signed)
History and Physical Interval Note:  12/28/2013 7:52 AM  Julie Haas  has presented today for surgery, with the diagnosis of afib  The various methods of treatment have been discussed with the patient and family. After consideration of risks, benefits and other options for treatment, the patient has consented to  Procedure(s): TRANSESOPHAGEAL ECHOCARDIOGRAM (TEE) (N/A) CARDIOVERSION (N/A) as a surgical intervention .  The patient's history has been reviewed, patient examined, no change in status, stable for surgery.  I have reviewed the patient's chart and labs.  Questions were answered to the patient's satisfaction.     Dorothy Spark

## 2013-12-28 NOTE — Assessment & Plan Note (Signed)
11/30/13 Hgb 9.7

## 2013-12-28 NOTE — Assessment & Plan Note (Signed)
Controlled.  

## 2013-12-28 NOTE — Assessment & Plan Note (Signed)
No bleeding.  Continue eliquis

## 2013-12-28 NOTE — Assessment & Plan Note (Signed)
Normal LVF by echo Aug 2015 12/21/13 Cardiology: dc Metolazone. Decreased Amiodarone to 200mg  daily

## 2013-12-28 NOTE — Assessment & Plan Note (Signed)
11/05/13 Sertraline 25mg  daily.  12/28/13 dc Sertraline per POA's request-causing the patient's confusion.

## 2013-12-28 NOTE — Assessment & Plan Note (Signed)
10/05/13 EKG [facility] Afib-vent rate 100-110.  11/15/13 EET cardioversion 11/30/13 HR 90s. Continued Eliquis 5mg  bid.  12/03/13 EKG vent rate 90s. QT/QTC 384/431 12/21/13 Cardiology decreased Amiodarone 200mg  daily. Dc Metolazone  12/28/13 TEE cardioversion-the patient declined.

## 2013-12-28 NOTE — Assessment & Plan Note (Signed)
Improved. Only trace edema BLE

## 2013-12-30 ENCOUNTER — Encounter: Payer: Self-pay | Admitting: Cardiology

## 2013-12-30 ENCOUNTER — Telehealth: Payer: Self-pay | Admitting: Cardiology

## 2013-12-30 NOTE — Telephone Encounter (Signed)
Spoke with Enhaut. She wanted Dr. Percival Spanish to be updated on patient's most recent labs (reiceved and scanned into Dr. Rosezella Florida in-basket). They are decreasing patient's K+ from 160mg  to 100mg  r/t potassium of 5.1 and will recheck BMP on Monday 11/16 (this change made by NP @ [facility]). Zaroxlyn was d/c'ed on 11/3 - they wanted to check to ensure these changes are OK.   Labs from 12/30/13 (BMP w/GFR and CBC with Diff) Na 141 K 5.1 Cl 99 CO2 29 Gluc 105 BUN 24 Creat 0.9 Calc 8.5 Hgb 11.3  Patient also had CXR  >> minimal to mid cardiomegaly. Mild to moderate pulmonary vascular congestion. At least small basilar pleural effusions. Bilateral extensive pulmonary infiltrates in context of these findings more likely pulmonary edema, but cannot rule out coexisting or alternative pneumonia at this point.  > Impression: combined findings consistent with ongoing congestive heart failure, but please see above commend. Clinical correlation and continued follow up recommended.    Will defer to Dr. Percival Spanish to advise.   Paper fax and information in Dr. Rosezella Florida mail box for review.

## 2013-12-30 NOTE — Telephone Encounter (Signed)
[name] is calling about Mrs. Julie Haas was supposed to have a Cardioversion last Tuesday , and she did not go because potassium level was at 3.1 and today it is at 5.1. The last time she saw the PA they deleted her Zaroxolyn and they will reopeat her BNP on Monday and is increasing her Potassium to 100mg  daily . [name] will fax labs..    Thanks

## 2013-12-31 ENCOUNTER — Telehealth: Payer: Self-pay | Admitting: Cardiology

## 2013-12-31 ENCOUNTER — Encounter: Payer: Self-pay | Admitting: *Deleted

## 2013-12-31 ENCOUNTER — Encounter: Payer: Self-pay | Admitting: Nurse Practitioner

## 2013-12-31 ENCOUNTER — Non-Acute Institutional Stay (SKILLED_NURSING_FACILITY): Payer: Medicare Other | Admitting: Nurse Practitioner

## 2013-12-31 DIAGNOSIS — Z7901 Long term (current) use of anticoagulants: Secondary | ICD-10-CM

## 2013-12-31 DIAGNOSIS — I5031 Acute diastolic (congestive) heart failure: Secondary | ICD-10-CM

## 2013-12-31 DIAGNOSIS — E876 Hypokalemia: Secondary | ICD-10-CM

## 2013-12-31 DIAGNOSIS — I480 Paroxysmal atrial fibrillation: Secondary | ICD-10-CM

## 2013-12-31 DIAGNOSIS — F32A Depression, unspecified: Secondary | ICD-10-CM

## 2013-12-31 DIAGNOSIS — I10 Essential (primary) hypertension: Secondary | ICD-10-CM

## 2013-12-31 DIAGNOSIS — D638 Anemia in other chronic diseases classified elsewhere: Secondary | ICD-10-CM

## 2013-12-31 DIAGNOSIS — R609 Edema, unspecified: Secondary | ICD-10-CM

## 2013-12-31 DIAGNOSIS — F329 Major depressive disorder, single episode, unspecified: Secondary | ICD-10-CM

## 2013-12-31 DIAGNOSIS — J9601 Acute respiratory failure with hypoxia: Secondary | ICD-10-CM

## 2013-12-31 NOTE — Assessment & Plan Note (Signed)
SOB, O2 dependent, afebrile. 12/01/13 CXR showed multiple bilateral pneumonia-completed Avelox 400mg  qd x 2 weeks started 12/02/13 12/30/13 CXR consistent with ongoing congestive heart failure.

## 2013-12-31 NOTE — Assessment & Plan Note (Signed)
10/05/13 EKG [facility] Afib-vent rate 100-110.  11/15/13 EET cardioversion 11/30/13 HR 90s. Continued Eliquis 5mg  bid.  12/03/13 EKG vent rate 90s. QT/QTC 384/431 12/21/13 Cardiology decreased Amiodarone 200mg  daily. Dc Metolazone  12/28/13 TEE cardioversion-the patient declined.  12/30/13 tachycardia episode.

## 2013-12-31 NOTE — Assessment & Plan Note (Signed)
12/01/13 K 3.4 12/02/13 K 3.0 12/03/13 Kcl 49meq daily, 80mg  T +F with Metolazone since 12/06/13 12/06/13 K 3.5  12/13/13 K 3.3-continue Kcl 23meq daily 12/17/13 K 3.3-increase Kcl to 191meq qd 12/23/13 K 3.1-addition Kcl 34meq x1 12/24/13 K 3.8 12/27/13 K 3.1-Kcl 80mg  bid-BMP 12/30/13 K 5.1-Kcl down to 100mg , N/V associated with Kcl-better with liquid form-will update BMP weekly.

## 2013-12-31 NOTE — Assessment & Plan Note (Signed)
edema BLE 1+

## 2013-12-31 NOTE — Assessment & Plan Note (Signed)
Controlled.  

## 2013-12-31 NOTE — Assessment & Plan Note (Signed)
11/30/13 Hgb 9.7 12/30/13 Hgb 11.3

## 2013-12-31 NOTE — Assessment & Plan Note (Signed)
Normal LVF by echo Aug 2015 12/21/13 Cardiology: dc Metolazone. Decreased Amiodarone to 200mg  daily 11/131/5 tachycardia episode 115-135 12/30/13-HR 80-90s 12/31/13. C/o SOB. CXR 12/30/13 consistent with ongoing CHF-will f/u Cardiology.

## 2013-12-31 NOTE — Telephone Encounter (Signed)
Julie Haas says that she spoke with a nurse yesterday about putting some orders in for the pt and she was following up on that. Please call  thanks

## 2013-12-31 NOTE — Progress Notes (Signed)
Patient ID: Julie Haas, female   DOB: [birth year], 80 y.o.   MRN: [id number]    Code Status: DNR, living will.   Allergies  Allergen Reactions  . Codeine     Unknown  . Cucumber Extract Nausea And Vomiting  . Diuretic [Buchu-[place]sil[place]-Ch Grass-Hydran]     Unknown  . Lipitor [Atorvastatin Calcium]     Unknown  . Penicillins Other (See Comments)    unknown  . Pravachol     Unknown    Chief Complaint  Patient presents with  . Medical Management of Chronic Issues  . Acute Visit    tachycardia    HPI: Patient is a 80 y.o. female seen in the SNF at [facility] today for evaluation of SOB/tachycardia episode 12/30/13 and other chronic medical conditions.     Hospitalized 11/09/13-11/22/13 Acute respiratory failure with hypoxia HTN (hypertension) Hyperlipidemia Atrial fibrillation with rapid ventricular response Acute diastolic heart failure HCAP (healthcare-associated pneumonia) Lung nodule Hypokalemia Depression Respiratory distress    Hospitalized 10/11/2013-10/20/2013 for HCAP (healthcare-associated pneumonia) Atrial fibrillation Acute diastolic heart failure Hypoxemia Acute respiratory failure with hypoxia.  The patient had recent admission to hospital for PNA s/p TEE/DCCV (discharged on 8/15) and presented to [facility] ED with main concern of progressively worsening shortness of breath, mostly exertional but occasionally present at rest. Associated with productive cough of yellow sputum, subjective fevers, chills, LE edema and 2 pillow orthopnea      Hospitalized 09/24/2013-10/02/2013 for Acute diastolic heart failure HTN (hypertension) Hyperlipidemia Wound of left leg(since Nov 2014-f/u [place]) Bilateral moderate carotid disease by doppler Atrial fibrillation-conversion 10/01/13 Mild aortic stenosis with normal LVF Pneumonia(Levaquin 750mg  daily) Acute resp failure     Presented ED with SOB. She had a prior history of PVCs but no confirmed AF per her recollection. She was noted  in AF with rates in the >150 range. She was given diltiazem en route. In the ED, she was given additional diltiazem and placed on an infusion with better control of her HR in the 110s-120s. She was admitted to hospital for AF-RVR and continue on IV dilt. IV heparin added but later changed to Eliquis. She was given 20mg  of IV lasix for volume overload. She ultimately diuresed -14L net. TEE/DCCV was completed on Aug 14. Fortaz and vancomycin started Monday, Aug 10 for PNA. WBC decreased. She was changed to PO Levaquin 750 mg a day x 5 days(dc 10/07/13)   Problem List Items Addressed This Visit    Hypokalemia (Chronic)    12/01/13 K 3.4 12/02/13 K 3.0 12/03/13 Kcl 68meq daily, 80mg  T +F with Metolazone since 12/06/13 12/06/13 K 3.5  12/13/13 K 3.3-continue Kcl 92meq daily 12/17/13 K 3.3-increase Kcl to 160meq qd 12/23/13 K 3.1-addition Kcl 58meq x1 12/24/13 K 3.8 12/27/13 K 3.1-Kcl 80mg  bid-BMP 12/30/13 K 5.1-Kcl down to 100mg , N/V associated with Kcl-better with liquid form-will update BMP weekly.       HTN (hypertension) (Chronic)    Controlled.      Edema (Chronic)     edema BLE 1+      Depression    11/05/13 Sertraline 25mg  daily.  12/28/13 dc Sertraline per POA's request-causing the patient's confusion.  12/31/13 stable.      Atrial fibrillation (Chronic)    10/05/13 EKG SNF [facility] Afib-vent rate 100-110.  11/15/13 EET cardioversion 11/30/13 HR 90s. Continued Eliquis 5mg  bid.  12/03/13 EKG vent rate 90s. QT/QTC 384/431 12/21/13 Cardiology decreased Amiodarone 200mg  daily. Dc Metolazone  12/28/13 TEE cardioversion-the patient declined.  12/30/13 tachycardia episode.       Anemia of chronic disease    11/30/13 Hgb 9.7 12/30/13 Hgb 11.3     Adequate anticoagulation on anticoagulant therapy    No bleeding.  Continue eliquis     Acute respiratory failure with hypoxia    SOB, O2 dependent, afebrile. 12/01/13 CXR showed multiple bilateral pneumonia-completed Avelox 400mg  qd x 2  weeks started 12/02/13 12/30/13 CXR consistent with ongoing congestive heart failure.            Acute diastolic heart failure - Primary    Normal LVF by echo Aug 2015 12/21/13 Cardiology: dc Metolazone. Decreased Amiodarone to 200mg  daily 11/131/5 tachycardia episode 115-135 12/30/13-HR 80-90s 12/31/13. C/o SOB. CXR 12/30/13 consistent with ongoing CHF-will f/u Cardiology.         Review of Systems:  Review of Systems  Constitutional: Negative for fever, chills, weight loss, malaise/fatigue and diaphoresis.  HENT: Positive for hearing loss. Negative for congestion, ear discharge, ear pain, nosebleeds, sore throat and tinnitus.   Eyes: Negative for blurred vision, double vision, photophobia, pain, discharge and redness.  Respiratory: Positive for cough. Negative for hemoptysis, sputum production, shortness of breath, wheezing and stridor.        Hacking  Cardiovascular: Positive for leg swelling and PND. Negative for chest pain, palpitations, orthopnea and claudication.       BLE L>R. 1+  Gastrointestinal: Positive for nausea and vomiting. Negative for heartburn, abdominal pain, diarrhea, constipation, blood in stool and melena.       Associated with Kcl   Genitourinary: Negative for dysuria, urgency, frequency, hematuria and flank pain.  Musculoskeletal: Positive for falls. Negative for myalgias, back pain, joint pain and neck pain.       Assisted to floor when transferring to toilet-no apparent injury.   Skin: Negative for itching and rash.       Left lower leg wound since Nov 2014-healed.   Neurological: Negative for dizziness, tingling, tremors, sensory change, speech change, focal weakness, seizures, loss of consciousness, weakness and headaches.  Endo/Heme/Allergies: Negative for environmental allergies and polydipsia. Does not bruise/bleed easily.  Psychiatric/Behavioral: Positive for depression. Negative for suicidal ideas, hallucinations, memory loss and substance abuse.  The patient is nervous/anxious. The patient does not have insomnia.      Past Medical History  Diagnosis Date  . Hyperlipidemia   . Arrhythmia     PVC  . Retinal detachment   . Renal disorder   . Allergy   . Occlusion and stenosis of carotid artery without mention of cerebral infarction 03/24/2012  . Pneumonia, organism unspecified 11/192013  . Depressive disorder, not elsewhere classified 03/05/2011  . Other premature beats 03/05/2011  . Other seborrheic keratosis 11/20/2010  . Syncope and collapse 10/02/2010  . Palpitations 11/21/2009  . Unspecified essential hypertension 06/27/2009  . Cardiomegaly 06/27/2009  . Other generalized ischemic cerebrovascular disease 06/27/2009  . Dizziness and giddiness 06/27/2009  . Unspecified vitamin D deficiency 05/16/2009  . Family history of sudden cardiac death (SCD) Dec 19, 2008  . Rash and other nonspecific skin eruption 07/05/2008  . Edema 03/15/2008  . Obesity, unspecified 07/25/2006  . Varicose veins of lower extremities with inflammation 03/16/2003  . Shortness of breath    Past Surgical History  Procedure Laterality Date  . Cholecystectomy  1983  . Cataract extraction Bilateral     Dr. [name]  . Tonsillectomy    . Keratosis    . Retinal detachment surgery Left   . Cardioversion N/A 10/01/2013    Procedure:  CARDIOVERSION;  Surgeon: Sanda Klein, MD;  Location: [facility] ENDOSCOPY;  Service: Cardiovascular;  Laterality: N/A;  . Tee without cardioversion N/A 10/01/2013    Procedure: TRANSESOPHAGEAL ECHOCARDIOGRAM (TEE);  Surgeon: Sanda Klein, MD;  Location: [facility] ENDOSCOPY;  Service: Cardiovascular;  Laterality: N/A;  . Tee without cardioversion N/A 11/15/2013    Procedure: TRANSESOPHAGEAL ECHOCARDIOGRAM (TEE);  Surgeon: Fay Records, MD;  Location: [facility] ENDOSCOPY;  Service: Cardiovascular;  Laterality: N/A;   Social History:   reports that she has never smoked. She has never used smokeless tobacco. She reports that she does not drink alcohol or use  illicit drugs.  Family History  Problem Relation Age of Onset  . Arrhythmia      Long QT in first degree relatives,  . Heart attack Father   . Heart disease Father   . Heart attack Brother   . Heart disease Brother   . Pneumonia Mother     Medications: Patient's Medications  New Prescriptions   No medications on file  Previous Medications   ALBUTEROL (PROVENTIL) (2.5 MG/3ML) 0.083% NEBULIZER SOLUTION    Take 2.5 mg by nebulization every 6 (six) hours as needed for wheezing or shortness of breath.   AMIODARONE (PACERONE) 200 MG TABLET    Take 200 mg by mouth daily.   APIXABAN (ELIQUIS) 5 MG TABS TABLET    Take 1 tablet (5 mg total) by mouth 2 (two) times daily.   FUROSEMIDE (LASIX) 80 MG TABLET    Take 80 mg by mouth 2 (two) times daily.   GUAIFENESIN-DEXTROMETHORPHAN (ROBITUSSIN DM) 100-10 MG/5ML SYRUP    Take 10 mLs by mouth every 6 (six) hours as needed for cough.   IPRATROPIUM (ATROVENT) 0.02 % NEBULIZER SOLUTION    Inhale 0.2 mg into the lungs every 4 (four) hours as needed for wheezing or shortness of breath.    LEVALBUTEROL (XOPENEX) 0.63 MG/3ML NEBULIZER SOLUTION    Take 3 mLs (0.63 mg total) by nebulization every 4 (four) hours as needed for wheezing or shortness of breath.   METOPROLOL (LOPRESSOR) 50 MG TABLET    Take 1 tablet (50 mg total) by mouth 2 (two) times daily.   MULTIPLE VITAMINS-MINERALS (PRESERVISION AREDS) CAPS    Take 1 capsule by mouth 2 (two) times daily.   POTASSIUM CHLORIDE SA (KLOR-CON M20) 20 MEQ TABLET    Take 100 mEq by mouth daily.    SERTRALINE (ZOLOFT) 25 MG TABLET    Take 0.5 tablets (12.5 mg total) by mouth daily.   VITAMIN E 1000 UNIT CAPSULE    Take 1,000 Units by mouth daily.  Modified Medications   No medications on file  Discontinued Medications   No medications on file     Physical Exam: Physical Exam  Constitutional:  overwewight  HENT:  Right Ear: External ear normal.  Left Ear: External ear normal.  Nose: Nose normal.  Eyes:    Corrective lenses.  Neck: Normal range of motion. Neck supple. No JVD present. No tracheal deviation present. No thyromegaly present.  Cardiovascular: Intact distal pulses.  Exam reveals no gallop and no friction rub.   Murmur heard. Varicose leg veins. 2+ edema LLE>RLE. HR 90s irregular. Systolic murmur apex of heart 2-3/6 noted.   Pulmonary/Chest: No respiratory distress. She has no wheezes. She has rales (bronchial rattle).  Moist rales posterior lower lungs.   Abdominal: Soft. Bowel sounds are normal. She exhibits no distension and no mass. There is no tenderness.  Musculoskeletal: Normal range of motion. She exhibits edema.  She exhibits no tenderness.  1+edema BLE L>R  Lymphadenopathy:    She has no cervical adenopathy.  Neurological: She is alert. No cranial nerve deficit. Coordination normal.  Skin: No rash noted. No erythema. No pallor.  Open area is about 36mm by 14 mm. Some swelling of the leg above this. Small hematoma at superior end of the injury.  Psychiatric: Her behavior is normal. Judgment and thought content normal.  Depressive and anxious mood.     Filed Vitals:   12/31/13 1152  BP: 104/76  Pulse: 88  Temp: 99.6 F (37.6 C)  TempSrc: Tympanic  Resp: 18      Labs reviewed: Basic Metabolic Panel:  Recent Labs  09/24/13 0834  10/12/13 0404  11/12/13 0520  11/20/13 0338 11/21/13 0328 11/22/13 0306  12/17/13 12/23/13 0935 12/24/13 12/27/13  NA  --   < > 137  < > 135*  < > 137 131* 134*  < > 142 138 140 139  K  --   < > 3.5*  < > 2.7*  < > 4.7 4.3 4.4  < > 3.3* 3.1* 3.8 3.1*  CL  --   < > 95*  < > 82*  < > 94* 86* 90*  --   --   --   --   --   CO2  --   < > 27  < > 42*  < > 36* 35* 36*  --   --   --   --   --   GLUCOSE  --   < > 92  < > 88  < > 102* 86 92  --   --  99  --   --   BUN  --   < > 13  < > 19  < > 17 18 20   < > 35*  --  44* 78*  CREATININE  --   < > 0.60  < > 0.63  < > 0.58 0.70 0.69  < > 1.0  --  1.3* 1.0  CALCIUM  --   < > 7.9*  < > 8.1*  <  > 8.9 8.2* 8.2*  --   --   --   --   --   MG  --   --  1.7  --  1.6  --   --   --   --   --   --   --   --   --   TSH 2.580  --   --   --   --   --   --   --   --   --   --   --   --   --   < > = values in this interval not displayed. Liver Function Tests:  Recent Labs  11/09/13 0658 11/14/13 0410 11/17/13 0322  AST 23 28 21   ALT 18 24 19   ALKPHOS 80 68 70  BILITOT 1.0 0.4 0.6  PROT 7.5 6.5 6.9  ALBUMIN 2.9* 2.5* 2.6*   No results for input(s): LIPASE, AMYLASE in the last 8760 hours. No results for input(s): AMMONIA in the last 8760 hours. CBC:  Recent Labs  11/17/13 0322 11/18/13 0435 11/19/13 0253 11/30/13 12/23/13 0935  WBC 8.4 9.2 10.2 9.1  --   NEUTROABS 6.0 5.9 6.4  --   --   HGB 10.9* 10.5* 10.9* 9.7* 13.6  HCT 32.6* 31.8* 31.0* 29* 40.0  MCV 92.9 90.6 89.6  --   --  PLT 491* 427* 442* 360  --    Lipid Panel:  Recent Labs  03/22/13 09/25/13 0321  CHOL 229* 191  HDL 63 86  LDLCALC 148 95  TRIG 92 48  CHOLHDL  --  2.2    Past Procedures:  09/30/13 CXR  IMPRESSION: No active cardiopulmonary disease.  10/01/13   Echocardiogram transesophageal                             LV EF: 55% -   60%  10/11/13 CT chest:  IMPRESSION:  Patchy bilateral airspace process with nodularity most prominent  over the right upper lobe likely due to infection. Patchy  bronchiectatic change as described. Small bilateral pleural  effusions. 1.3 cm pretracheal lymph node likely reactive. Recommend  followup CT in 6-8 weeks to re-evaluate the areas of nodularity.  Cardiomegaly with calcification of the mitral valve annulus and left  atrial enlargement. Atherosclerotic coronary artery disease. Small  amount of pericardial fluid.  1.1 cm liver cyst unchanged.  11/17/13 CXR   IMPRESSION:  Mild bilateral pleural effusions are noted with left greater than  right. Stable bilateral lung opacities are noted consistent with  pneumonia or edema, with right worse than left.    12/30/13 CXR consistent with ongoing congestive heart failure.      Assessment/Plan Acute diastolic heart failure Normal LVF by echo Aug 2015 12/21/13 Cardiology: dc Metolazone. Decreased Amiodarone to 200mg  daily 11/131/5 tachycardia episode 115-135 12/30/13-HR 80-90s 12/31/13. C/o SOB. CXR 12/30/13 consistent with ongoing CHF-will f/u Cardiology.    Hypokalemia 12/01/13 K 3.4 12/02/13 K 3.0 12/03/13 Kcl 65meq daily, 80mg  T +F with Metolazone since 12/06/13 12/06/13 K 3.5  12/13/13 K 3.3-continue Kcl 62meq daily 12/17/13 K 3.3-increase Kcl to 136meq qd 12/23/13 K 3.1-addition Kcl 18meq x1 12/24/13 K 3.8 12/27/13 K 3.1-Kcl 80mg  bid-BMP 12/30/13 K 5.1-Kcl down to 100mg , N/V associated with Kcl-better with liquid form-will update BMP weekly.     Acute respiratory failure with hypoxia SOB, O2 dependent, afebrile. 12/01/13 CXR showed multiple bilateral pneumonia-completed Avelox 400mg  qd x 2 weeks started 12/02/13 12/30/13 CXR consistent with ongoing congestive heart failure.          Adequate anticoagulation on anticoagulant therapy No bleeding.  Continue eliquis   Anemia of chronic disease 11/30/13 Hgb 9.7 12/30/13 Hgb 11.3   Atrial fibrillation 10/05/13 EKG [facility] Afib-vent rate 100-110.  11/15/13 EET cardioversion 11/30/13 HR 90s. Continued Eliquis 5mg  bid.  12/03/13 EKG vent rate 90s. QT/QTC 384/431 12/21/13 Cardiology decreased Amiodarone 200mg  daily. Dc Metolazone  12/28/13 TEE cardioversion-the patient declined.  12/30/13 tachycardia episode.     Depression 11/05/13 Sertraline 25mg  daily.  12/28/13 dc Sertraline per POA's request-causing the patient's confusion.  12/31/13 stable.    Edema  edema BLE 1+    HTN (hypertension) Controlled.      Family/ Staff Communication: observe the patient.   Goals of Care: AL  Labs/tests ordered: BMP weekly.

## 2013-12-31 NOTE — Telephone Encounter (Signed)
Spoke to Floral - RN informed [name] that Dr. Percival Spanish has not commented as of yet.  She verbalized understanding.

## 2013-12-31 NOTE — Assessment & Plan Note (Signed)
No bleeding.  Continue eliquis

## 2013-12-31 NOTE — Assessment & Plan Note (Signed)
11/05/13 Sertraline 25mg  daily.  12/28/13 dc Sertraline per POA's request-causing the patient's confusion.  12/31/13 stable.

## 2014-01-03 LAB — BASIC METABOLIC PANEL
BUN: 14 mg/dL (ref 4–21)
Creatinine: 0.8 mg/dL (ref 0.5–1.1)
GLUCOSE: 122 mg/dL
Potassium: 3.6 mmol/L (ref 3.4–5.3)
SODIUM: 138 mmol/L (ref 137–147)

## 2014-01-04 ENCOUNTER — Non-Acute Institutional Stay (SKILLED_NURSING_FACILITY): Payer: Medicare Other | Admitting: Nurse Practitioner

## 2014-01-04 ENCOUNTER — Encounter: Payer: Self-pay | Admitting: Nurse Practitioner

## 2014-01-04 DIAGNOSIS — I10 Essential (primary) hypertension: Secondary | ICD-10-CM

## 2014-01-04 DIAGNOSIS — E876 Hypokalemia: Secondary | ICD-10-CM

## 2014-01-04 DIAGNOSIS — R609 Edema, unspecified: Secondary | ICD-10-CM

## 2014-01-04 DIAGNOSIS — J9601 Acute respiratory failure with hypoxia: Secondary | ICD-10-CM

## 2014-01-04 DIAGNOSIS — I5031 Acute diastolic (congestive) heart failure: Secondary | ICD-10-CM

## 2014-01-04 DIAGNOSIS — I482 Chronic atrial fibrillation, unspecified: Secondary | ICD-10-CM

## 2014-01-04 DIAGNOSIS — F329 Major depressive disorder, single episode, unspecified: Secondary | ICD-10-CM

## 2014-01-04 DIAGNOSIS — K219 Gastro-esophageal reflux disease without esophagitis: Secondary | ICD-10-CM

## 2014-01-04 DIAGNOSIS — F32A Depression, unspecified: Secondary | ICD-10-CM

## 2014-01-04 DIAGNOSIS — D638 Anemia in other chronic diseases classified elsewhere: Secondary | ICD-10-CM

## 2014-01-04 DIAGNOSIS — Z7901 Long term (current) use of anticoagulants: Secondary | ICD-10-CM

## 2014-01-04 NOTE — Assessment & Plan Note (Signed)
11/30/13 Hgb 9.7 12/30/13 Hgb 11.3

## 2014-01-04 NOTE — Assessment & Plan Note (Signed)
10/05/13 EKG [facility] Afib-vent rate 100-110.  11/15/13 EET cardioversion 11/30/13 HR 90s. Continued Eliquis 5mg  bid.  12/03/13 EKG vent rate 90s. QT/QTC 384/431 12/21/13 Cardiology decreased Amiodarone 200mg  daily. Dc Metolazone  12/28/13 TEE cardioversion-the patient declined.  12/30/13 tachycardia episode.  01/04/14 Heart rate is in control.

## 2014-01-04 NOTE — Assessment & Plan Note (Signed)
No bleeding.  Continue eliquis

## 2014-01-04 NOTE — Assessment & Plan Note (Signed)
12/01/13 K 3.4 12/02/13 K 3.0 12/03/13 Kcl 49meq daily, 80mg  T +F with Metolazone since 12/06/13 12/06/13 K 3.5  12/13/13 K 3.3-continue Kcl 51meq daily 12/17/13 K 3.3-increase Kcl to 149meq qd 12/23/13 K 3.1-addition Kcl 69meq x1 12/24/13 K 3.8 12/27/13 K 3.1-Kcl 80mg  bid-BMP 12/30/13 K 5.1-Kcl down to 100mg , N/V associated with Kcl-better with liquid form-will update BMP weekly.  11/116/15 K 3.6-continue BMP weekly.

## 2014-01-04 NOTE — Assessment & Plan Note (Signed)
Normal LVF by echo Aug 2015 12/21/13 Cardiology: dc Metolazone. Decreased Amiodarone to 200mg  daily 11/131/5 tachycardia episode 115-135 12/30/13-HR 80-90s 12/31/13. C/o SOB. CXR 12/30/13 consistent with ongoing CHF-will f/u Cardiology. 01/04/14 seems compensated clinically.

## 2014-01-04 NOTE — Assessment & Plan Note (Signed)
SOB, O2 dependent, afebrile. 12/01/13 CXR showed multiple bilateral pneumonia-completed Avelox 400mg  qd x 2 weeks started 12/02/13 12/30/13 CXR consistent with ongoing congestive heart failure.  01/04/14 clinically stabilized.

## 2014-01-04 NOTE — Telephone Encounter (Signed)
Changes seem reasonable.

## 2014-01-04 NOTE — Progress Notes (Signed)
Patient ID: Julie Haas, female   DOB: 1925/04/18, 80 y.o.   MRN: [id number]    Code Status: DNR, living will.   Allergies  Allergen Reactions  . Codeine     Unknown  . Cucumber Extract Nausea And Vomiting  . Diuretic [Buchu-Cornsilk-Ch Grass-Hydran]     Unknown  . Lipitor [Atorvastatin Calcium]     Unknown  . Penicillins Other (See Comments)    unknown  . Pravachol     Unknown    Chief Complaint  Patient presents with  . Medical Management of Chronic Issues  . Acute Visit    nausea, decreased appetite    HPI: Patient is a 80 y.o. female seen in the SNF at [facility] today for evaluation of nauseas/decreased appetite and other chronic medical conditions.     Hospitalized 11/09/13-11/22/13 Acute respiratory failure with hypoxia HTN (hypertension) Hyperlipidemia Atrial fibrillation with rapid ventricular response Acute diastolic heart failure HCAP (healthcare-associated pneumonia) Lung nodule Hypokalemia Depression Respiratory distress    Hospitalized 10/11/2013-10/20/2013 for HCAP (healthcare-associated pneumonia) Atrial fibrillation Acute diastolic heart failure Hypoxemia Acute respiratory failure with hypoxia.  The patient had recent admission to hospital for PNA s/p TEE/DCCV (discharged on 8/15) and presented to [facility] ED with main concern of progressively worsening shortness of breath, mostly exertional but occasionally present at rest. Associated with productive cough of yellow sputum, subjective fevers, chills, LE edema and 2 pillow orthopnea      Hospitalized 09/24/2013-10/02/2013 for Acute diastolic heart failure HTN (hypertension) Hyperlipidemia Wound of left leg(since Nov 2014-f/u [facility]) Bilateral moderate carotid disease by doppler Atrial fibrillation-conversion 10/01/13 Mild aortic stenosis with normal LVF Pneumonia(Levaquin 750mg  daily) Acute resp failure     Presented ED with SOB. She had a prior history of PVCs but no confirmed AF per her recollection. She  was noted in AF with rates in the >150 range. She was given diltiazem en route. In the ED, she was given additional diltiazem and placed on an infusion with better control of her HR in the 110s-120s. She was admitted to hospital for AF-RVR and continue on IV dilt. IV heparin added but later changed to Eliquis. She was given 20mg  of IV lasix for volume overload. She ultimately diuresed -14L net. TEE/DCCV was completed on Aug 14. Fortaz and vancomycin started Monday, Aug 10 for PNA. WBC decreased. She was changed to PO Levaquin 750 mg a day x 5 days(dc 10/07/13)   Problem List Items Addressed This Visit    Hypokalemia (Chronic)    12/01/13 K 3.4 12/02/13 K 3.0 12/03/13 Kcl 6meq daily, 80mg  T +F with Metolazone since 12/06/13 12/06/13 K 3.5  12/13/13 K 3.3-continue Kcl 63meq daily 12/17/13 K 3.3-increase Kcl to 123meq qd 12/23/13 K 3.1-addition Kcl 20meq x1 12/24/13 K 3.8 12/27/13 K 3.1-Kcl 80mg  bid-BMP 12/30/13 K 5.1-Kcl down to 100mg , N/V associated with Kcl-better with liquid form-will update BMP weekly.  11/116/15 K 3.6-continue BMP weekly.          HTN (hypertension) (Chronic)    Controlled.      GERD (gastroesophageal reflux disease) - Primary    C/o nausea ? Associated with Kcl-will try Omeprazole 20mg  po daily and prn Phenergan 12.5mg  q6h prn. Observe     Edema (Chronic)    edema BLE 1+     Depression    11/05/13 Sertraline 25mg  daily.  12/28/13 dc Sertraline per POA's request-causing the patient's confusion.  12/31/13 stable.  01/04/14 staff reported the patient has decreased appetite and nausea-? Associated  Kcl-will have prn Phenergan available to her and continue to monitor the patient.       Atrial fibrillation (Chronic)    10/05/13 EKG SNF [facility] Afib-vent rate 100-110.  11/15/13 EET cardioversion 11/30/13 HR 90s. Continued Eliquis 5mg  bid.  12/03/13 EKG vent rate 90s. QT/QTC 384/431 12/21/13 Cardiology decreased Amiodarone 200mg  daily. Dc Metolazone  12/28/13 TEE  cardioversion-the patient declined.  12/30/13 tachycardia episode.  01/04/14 Heart rate is in control.        Anemia of chronic disease    11/30/13 Hgb 9.7 12/30/13 Hgb 11.3     Adequate anticoagulation on anticoagulant therapy    No bleeding.  Continue eliquis     Acute respiratory failure with hypoxia    SOB, O2 dependent, afebrile. 12/01/13 CXR showed multiple bilateral pneumonia-completed Avelox 400mg  qd x 2 weeks started 12/02/13 12/30/13 CXR consistent with ongoing congestive heart failure.  01/04/14 clinically stabilized.     Acute diastolic heart failure    Normal LVF by echo Aug 2015 12/21/13 Cardiology: dc Metolazone. Decreased Amiodarone to 200mg  daily 11/131/5 tachycardia episode 115-135 12/30/13-HR 80-90s 12/31/13. C/o SOB. CXR 12/30/13 consistent with ongoing CHF-will f/u Cardiology. 01/04/14 seems compensated clinically.           Review of Systems:  Review of Systems  Constitutional: Negative for fever, chills, weight loss, malaise/fatigue and diaphoresis.  HENT: Positive for hearing loss. Negative for congestion, ear discharge, ear pain, nosebleeds, sore throat and tinnitus.   Eyes: Negative for blurred vision, double vision, photophobia, pain, discharge and redness.  Respiratory: Positive for cough. Negative for hemoptysis, sputum production, shortness of breath, wheezing and stridor.        Hacking  Cardiovascular: Positive for leg swelling and PND. Negative for chest pain, palpitations, orthopnea and claudication.       BLE L>R. 1+  Gastrointestinal: Positive for nausea. Negative for heartburn, vomiting, abdominal pain, diarrhea, constipation, blood in stool and melena.       Associated with Kcl   Genitourinary: Negative for dysuria, urgency, frequency, hematuria and flank pain.  Musculoskeletal: Positive for falls. Negative for myalgias, back pain, joint pain and neck pain.       Assisted to floor when transferring to toilet-no apparent injury.     Skin: Negative for itching and rash.       Left lower leg wound since Nov 2014-healed.   Neurological: Negative for dizziness, tingling, tremors, sensory change, speech change, focal weakness, seizures, loss of consciousness, weakness and headaches.  Endo/Heme/Allergies: Negative for environmental allergies and polydipsia. Does not bruise/bleed easily.  Psychiatric/Behavioral: Positive for depression. Negative for suicidal ideas, hallucinations, memory loss and substance abuse. The patient is nervous/anxious. The patient does not have insomnia.      Past Medical History  Diagnosis Date  . Hyperlipidemia   . Arrhythmia     PVC  . Retinal detachment   . Renal disorder   . Allergy   . Occlusion and stenosis of carotid artery without mention of cerebral infarction 03/24/2012  . Pneumonia, organism unspecified 11/192013  . Depressive disorder, not elsewhere classified 03/05/2011  . Other premature beats 03/05/2011  . Other seborrheic keratosis 11/20/2010  . Syncope and collapse 10/02/2010  . Palpitations 11/21/2009  . Unspecified essential hypertension 06/27/2009  . Cardiomegaly 06/27/2009  . Other generalized ischemic cerebrovascular disease 06/27/2009  . Dizziness and giddiness 06/27/2009  . Unspecified vitamin D deficiency 05/16/2009  . Family history of sudden cardiac death (SCD) 12-16-08  . Rash and other nonspecific skin eruption 07/05/2008  .  Edema 03/15/2008  . Obesity, unspecified 07/25/2006  . Varicose veins of lower extremities with inflammation 03/16/2003  . Shortness of breath    Past Surgical History  Procedure Laterality Date  . Cholecystectomy  1983  . Cataract extraction Bilateral     Dr. [name]  . Tonsillectomy    . Keratosis    . Retinal detachment surgery Left   . Cardioversion N/A 10/01/2013    Procedure: CARDIOVERSION;  Surgeon: Sanda Klein, MD;  Location: [facility] ENDOSCOPY;  Service: Cardiovascular;  Laterality: N/A;  . Tee without cardioversion N/A 10/01/2013     Procedure: TRANSESOPHAGEAL ECHOCARDIOGRAM (TEE);  Surgeon: Sanda Klein, MD;  Location: [facility] ENDOSCOPY;  Service: Cardiovascular;  Laterality: N/A;  . Tee without cardioversion N/A 11/15/2013    Procedure: TRANSESOPHAGEAL ECHOCARDIOGRAM (TEE);  Surgeon: Fay Records, MD;  Location: [facility] ENDOSCOPY;  Service: Cardiovascular;  Laterality: N/A;   Social History:   reports that she has never smoked. She has never used smokeless tobacco. She reports that she does not drink alcohol or use illicit drugs.  Family History  Problem Relation Age of Onset  . Arrhythmia      Long QT in first degree relatives,  . Heart attack Father   . Heart disease Father   . Heart attack Brother   . Heart disease Brother   . Pneumonia Mother     Medications: Patient's Medications  New Prescriptions   No medications on file  Previous Medications   ALBUTEROL (PROVENTIL) (2.5 MG/3ML) 0.083% NEBULIZER SOLUTION    Take 2.5 mg by nebulization every 6 (six) hours as needed for wheezing or shortness of breath.   AMIODARONE (PACERONE) 200 MG TABLET    Take 200 mg by mouth daily.   APIXABAN (ELIQUIS) 5 MG TABS TABLET    Take 1 tablet (5 mg total) by mouth 2 (two) times daily.   FUROSEMIDE (LASIX) 80 MG TABLET    Take 80 mg by mouth 2 (two) times daily.   GUAIFENESIN-DEXTROMETHORPHAN (ROBITUSSIN DM) 100-10 MG/5ML SYRUP    Take 10 mLs by mouth every 6 (six) hours as needed for cough.   IPRATROPIUM (ATROVENT) 0.02 % NEBULIZER SOLUTION    Inhale 0.2 mg into the lungs every 4 (four) hours as needed for wheezing or shortness of breath.    LEVALBUTEROL (XOPENEX) 0.63 MG/3ML NEBULIZER SOLUTION    Take 3 mLs (0.63 mg total) by nebulization every 4 (four) hours as needed for wheezing or shortness of breath.   METOLAZONE (ZAROXOLYN) 2.5 MG TABLET    Take 2.5 mg by mouth daily.   METOPROLOL (LOPRESSOR) 50 MG TABLET    Take 1 tablet (50 mg total) by mouth 2 (two) times daily.   MULTIPLE VITAMINS-MINERALS (PRESERVISION AREDS) CAPS    Take  1 capsule by mouth 2 (two) times daily.   POTASSIUM CHLORIDE SA (KLOR-CON M20) 20 MEQ TABLET    Take 100 mEq by mouth daily.    SERTRALINE (ZOLOFT) 25 MG TABLET    Take 0.5 tablets (12.5 mg total) by mouth daily.   VITAMIN E 1000 UNIT CAPSULE    Take 1,000 Units by mouth daily.  Modified Medications   No medications on file  Discontinued Medications   No medications on file     Physical Exam: Physical Exam  Constitutional:  overwewight  HENT:  Right Ear: External ear normal.  Left Ear: External ear normal.  Nose: Nose normal.  Eyes:  Corrective lenses.  Neck: Normal range of motion. Neck supple. No JVD present. No  tracheal deviation present. No thyromegaly present.  Cardiovascular: Intact distal pulses.  Exam reveals no gallop and no friction rub.   Murmur heard. Varicose leg veins. 2+ edema LLE>RLE. HR 90s irregular. Systolic murmur apex of heart 2-3/6 noted.   Pulmonary/Chest: No respiratory distress. She has no wheezes. She has rales (bronchial rattle).  Moist rales posterior lower lungs-improving  Abdominal: Soft. Bowel sounds are normal. She exhibits no distension and no mass. There is no tenderness.  Musculoskeletal: Normal range of motion. She exhibits edema. She exhibits no tenderness.  1+edema BLE L>R  Lymphadenopathy:    She has no cervical adenopathy.  Neurological: She is alert. No cranial nerve deficit. Coordination normal.  Skin: No rash noted. No erythema. No pallor.  Open area is about 91mm by 14 mm. Some swelling of the leg above this. Small hematoma at superior end of the injury.  Psychiatric: Her behavior is normal. Judgment and thought content normal.  Depressive and anxious mood.     Filed Vitals:   01/04/14 1332  BP: 118/82  Pulse: 90  Temp: 98.2 F (36.8 C)  TempSrc: Tympanic  Resp: 20      Labs reviewed: Basic Metabolic Panel:  Recent Labs  09/24/13 0834  10/12/13 0404  11/12/13 0520  11/20/13 0338 11/21/13 0328 11/22/13 0306   12/23/13 0935 12/24/13 12/27/13 01/03/14  NA  --   < > 137  < > 135*  < > 137 131* 134*  < > 138 140 139 138  K  --   < > 3.5*  < > 2.7*  < > 4.7 4.3 4.4  < > 3.1* 3.8 3.1* 3.6  CL  --   < > 95*  < > 82*  < > 94* 86* 90*  --   --   --   --   --   CO2  --   < > 27  < > 42*  < > 36* 35* 36*  --   --   --   --   --   GLUCOSE  --   < > 92  < > 88  < > 102* 86 92  --  99  --   --   --   BUN  --   < > 13  < > 19  < > 17 18 20   < >  --  44* 78* 14  CREATININE  --   < > 0.60  < > 0.63  < > 0.58 0.70 0.69  < >  --  1.3* 1.0 0.8  CALCIUM  --   < > 7.9*  < > 8.1*  < > 8.9 8.2* 8.2*  --   --   --   --   --   MG  --   --  1.7  --  1.6  --   --   --   --   --   --   --   --   --   TSH 2.580  --   --   --   --   --   --   --   --   --   --   --   --   --   < > = values in this interval not displayed. Liver Function Tests:  Recent Labs  11/09/13 0658 11/14/13 0410 11/17/13 0322  AST 23 28 21   ALT 18 24 19   ALKPHOS 80 68 70  BILITOT 1.0 0.4 0.6  PROT 7.5  6.5 6.9  ALBUMIN 2.9* 2.5* 2.6*   No results for input(s): LIPASE, AMYLASE in the last 8760 hours. No results for input(s): AMMONIA in the last 8760 hours. CBC:  Recent Labs  11/17/13 0322 11/18/13 0435 11/19/13 0253 11/30/13 12/23/13 0935  WBC 8.4 9.2 10.2 9.1  --   NEUTROABS 6.0 5.9 6.4  --   --   HGB 10.9* 10.5* 10.9* 9.7* 13.6  HCT 32.6* 31.8* 31.0* 29* 40.0  MCV 92.9 90.6 89.6  --   --   PLT 491* 427* 442* 360  --    Lipid Panel:  Recent Labs  03/22/13 09/25/13 0321  CHOL 229* 191  HDL 63 86  LDLCALC 148 95  TRIG 92 48  CHOLHDL  --  2.2    Past Procedures:  09/30/13 CXR  IMPRESSION: No active cardiopulmonary disease.  10/01/13   Echocardiogram transesophageal                             LV EF: 55% -   60%  10/11/13 CT chest:  IMPRESSION:  Patchy bilateral airspace process with nodularity most prominent  over the right upper lobe likely due to infection. Patchy  bronchiectatic change as described. Small  bilateral pleural  effusions. 1.3 cm pretracheal lymph node likely reactive. Recommend  followup CT in 6-8 weeks to re-evaluate the areas of nodularity.  Cardiomegaly with calcification of the mitral valve annulus and left  atrial enlargement. Atherosclerotic coronary artery disease. Small  amount of pericardial fluid.  1.1 cm liver cyst unchanged.  11/17/13 CXR   IMPRESSION:  Mild bilateral pleural effusions are noted with left greater than  right. Stable bilateral lung opacities are noted consistent with  pneumonia or edema, with right worse than left.   12/30/13 CXR consistent with ongoing congestive heart failure.      Assessment/Plan Acute diastolic heart failure Normal LVF by echo Aug 2015 12/21/13 Cardiology: dc Metolazone. Decreased Amiodarone to 200mg  daily 11/131/5 tachycardia episode 115-135 12/30/13-HR 80-90s 12/31/13. C/o SOB. CXR 12/30/13 consistent with ongoing CHF-will f/u Cardiology. 01/04/14 seems compensated clinically.      Adequate anticoagulation on anticoagulant therapy No bleeding.  Continue eliquis   Acute respiratory failure with hypoxia SOB, O2 dependent, afebrile. 12/01/13 CXR showed multiple bilateral pneumonia-completed Avelox 400mg  qd x 2 weeks started 12/02/13 12/30/13 CXR consistent with ongoing congestive heart failure.  01/04/14 clinically stabilized.   Anemia of chronic disease 11/30/13 Hgb 9.7 12/30/13 Hgb 11.3   Atrial fibrillation 10/05/13 EKG [facility] Afib-vent rate 100-110.  11/15/13 EET cardioversion 11/30/13 HR 90s. Continued Eliquis 5mg  bid.  12/03/13 EKG vent rate 90s. QT/QTC 384/431 12/21/13 Cardiology decreased Amiodarone 200mg  daily. Dc Metolazone  12/28/13 TEE cardioversion-the patient declined.  12/30/13 tachycardia episode.  01/04/14 Heart rate is in control.      Depression 11/05/13 Sertraline 25mg  daily.  12/28/13 dc Sertraline per POA's request-causing the patient's confusion.  12/31/13 stable.  01/04/14 staff  reported the patient has decreased appetite and nausea-? Associated Kcl-will have prn Phenergan available to her and continue to monitor the patient.     Edema edema BLE 1+   HTN (hypertension) Controlled.    Hypokalemia 12/01/13 K 3.4 12/02/13 K 3.0 12/03/13 Kcl 32meq daily, 80mg  T +F with Metolazone since 12/06/13 12/06/13 K 3.5  12/13/13 K 3.3-continue Kcl 53meq daily 12/17/13 K 3.3-increase Kcl to 119meq qd 12/23/13 K 3.1-addition Kcl 46meq x1 12/24/13 K 3.8 12/27/13 K 3.1-Kcl 80mg  bid-BMP 12/30/13 K  5.1-Kcl down to 100mg , N/V associated with Kcl-better with liquid form-will update BMP weekly.  11/116/15 K 3.6-continue BMP weekly.        GERD (gastroesophageal reflux disease) C/o nausea ? Associated with Kcl-will try Omeprazole 20mg  po daily and prn Phenergan 12.5mg  q6h prn. Observe     Family/ Staff Communication: observe the patient.   Goals of Care: AL  Labs/tests ordered: BMP weekly.

## 2014-01-04 NOTE — Assessment & Plan Note (Signed)
C/o nausea ? Associated with Kcl-will try Omeprazole 20mg  po daily and prn Phenergan 12.5mg  q6h prn. Observe

## 2014-01-04 NOTE — Assessment & Plan Note (Signed)
edema BLE 1+

## 2014-01-04 NOTE — Assessment & Plan Note (Signed)
11/05/13 Sertraline 25mg  daily.  12/28/13 dc Sertraline per POA's request-causing the patient's confusion.  12/31/13 stable.  01/04/14 staff reported the patient has decreased appetite and nausea-? Associated Kcl-will have prn Phenergan available to her and continue to monitor the patient.

## 2014-01-04 NOTE — Assessment & Plan Note (Signed)
Controlled.  

## 2014-01-05 NOTE — Telephone Encounter (Signed)
Notified [employer] that med changes made by their NP were OK.

## 2014-01-07 ENCOUNTER — Ambulatory Visit (INDEPENDENT_AMBULATORY_CARE_PROVIDER_SITE_OTHER): Payer: Medicare Other | Admitting: Cardiology

## 2014-01-07 ENCOUNTER — Encounter: Payer: Self-pay | Admitting: Cardiology

## 2014-01-07 ENCOUNTER — Emergency Department (HOSPITAL_COMMUNITY): Payer: Medicare Other

## 2014-01-07 ENCOUNTER — Telehealth: Payer: Self-pay | Admitting: Cardiology

## 2014-01-07 ENCOUNTER — Other Ambulatory Visit: Payer: Self-pay

## 2014-01-07 ENCOUNTER — Inpatient Hospital Stay (HOSPITAL_COMMUNITY)
Admission: EM | Admit: 2014-01-07 | Discharge: 2014-01-11 | DRG: 291 | Disposition: A | Discharge status: Skilled Nursing Facility | Payer: Medicare Other | Attending: Internal Medicine | Admitting: Internal Medicine

## 2014-01-07 ENCOUNTER — Encounter (HOSPITAL_COMMUNITY): Payer: Self-pay | Admitting: Emergency Medicine

## 2014-01-07 VITALS — BP 106/70 | HR 126

## 2014-01-07 DIAGNOSIS — E669 Obesity, unspecified: Secondary | ICD-10-CM | POA: Diagnosis present

## 2014-01-07 DIAGNOSIS — I8310 Varicose veins of unspecified lower extremity with inflammation: Secondary | ICD-10-CM | POA: Diagnosis present

## 2014-01-07 DIAGNOSIS — E785 Hyperlipidemia, unspecified: Secondary | ICD-10-CM | POA: Diagnosis present

## 2014-01-07 DIAGNOSIS — I8393 Asymptomatic varicose veins of bilateral lower extremities: Secondary | ICD-10-CM | POA: Diagnosis present

## 2014-01-07 DIAGNOSIS — Z888 Allergy status to other drugs, medicaments and biological substances status: Secondary | ICD-10-CM

## 2014-01-07 DIAGNOSIS — I10 Essential (primary) hypertension: Secondary | ICD-10-CM | POA: Diagnosis present

## 2014-01-07 DIAGNOSIS — I481 Persistent atrial fibrillation: Secondary | ICD-10-CM | POA: Diagnosis present

## 2014-01-07 DIAGNOSIS — I5033 Acute on chronic diastolic (congestive) heart failure: Secondary | ICD-10-CM | POA: Diagnosis not present

## 2014-01-07 DIAGNOSIS — Z880 Allergy status to penicillin: Secondary | ICD-10-CM

## 2014-01-07 DIAGNOSIS — D638 Anemia in other chronic diseases classified elsewhere: Secondary | ICD-10-CM | POA: Diagnosis present

## 2014-01-07 DIAGNOSIS — F32A Depression, unspecified: Secondary | ICD-10-CM | POA: Diagnosis present

## 2014-01-07 DIAGNOSIS — Z789 Other specified health status: Secondary | ICD-10-CM | POA: Diagnosis not present

## 2014-01-07 DIAGNOSIS — I4891 Unspecified atrial fibrillation: Secondary | ICD-10-CM

## 2014-01-07 DIAGNOSIS — I509 Heart failure, unspecified: Secondary | ICD-10-CM

## 2014-01-07 DIAGNOSIS — Z885 Allergy status to narcotic agent status: Secondary | ICD-10-CM

## 2014-01-07 DIAGNOSIS — I350 Nonrheumatic aortic (valve) stenosis: Secondary | ICD-10-CM | POA: Diagnosis present

## 2014-01-07 DIAGNOSIS — Y95 Nosocomial condition: Secondary | ICD-10-CM | POA: Diagnosis present

## 2014-01-07 DIAGNOSIS — Z6829 Body mass index (BMI) 29.0-29.9, adult: Secondary | ICD-10-CM

## 2014-01-07 DIAGNOSIS — Z515 Encounter for palliative care: Secondary | ICD-10-CM

## 2014-01-07 DIAGNOSIS — Z9841 Cataract extraction status, right eye: Secondary | ICD-10-CM

## 2014-01-07 DIAGNOSIS — K219 Gastro-esophageal reflux disease without esophagitis: Secondary | ICD-10-CM | POA: Diagnosis present

## 2014-01-07 DIAGNOSIS — I6529 Occlusion and stenosis of unspecified carotid artery: Secondary | ICD-10-CM

## 2014-01-07 DIAGNOSIS — N289 Disorder of kidney and ureter, unspecified: Secondary | ICD-10-CM | POA: Diagnosis present

## 2014-01-07 DIAGNOSIS — Z7901 Long term (current) use of anticoagulants: Secondary | ICD-10-CM

## 2014-01-07 DIAGNOSIS — Z66 Do not resuscitate: Secondary | ICD-10-CM | POA: Diagnosis present

## 2014-01-07 DIAGNOSIS — Z9842 Cataract extraction status, left eye: Secondary | ICD-10-CM

## 2014-01-07 DIAGNOSIS — F329 Major depressive disorder, single episode, unspecified: Secondary | ICD-10-CM | POA: Diagnosis present

## 2014-01-07 DIAGNOSIS — R0602 Shortness of breath: Secondary | ICD-10-CM | POA: Diagnosis not present

## 2014-01-07 DIAGNOSIS — J189 Pneumonia, unspecified organism: Secondary | ICD-10-CM | POA: Diagnosis present

## 2014-01-07 DIAGNOSIS — I5031 Acute diastolic (congestive) heart failure: Secondary | ICD-10-CM

## 2014-01-07 HISTORY — DX: Essential (primary) hypertension: I10

## 2014-01-07 HISTORY — DX: Chronic diastolic (congestive) heart failure: I50.32

## 2014-01-07 HISTORY — DX: Vitamin D deficiency, unspecified: E55.9

## 2014-01-07 HISTORY — DX: Occlusion and stenosis of unspecified carotid artery: I65.29

## 2014-01-07 LAB — PROTIME-INR
INR: 1.52 — ABNORMAL HIGH (ref 0.00–1.49)
PROTHROMBIN TIME: 18.4 s — AB (ref 11.6–15.2)

## 2014-01-07 LAB — BASIC METABOLIC PANEL
ANION GAP: 14 (ref 5–15)
BUN: 17 mg/dL (ref 6–23)
CHLORIDE: 94 meq/L — AB (ref 96–112)
CO2: 29 meq/L (ref 19–32)
Calcium: 9 mg/dL (ref 8.4–10.5)
Creatinine, Ser: 0.78 mg/dL (ref 0.50–1.10)
GFR calc non Af Amer: 74 mL/min — ABNORMAL LOW (ref >90)
GFR, EST AFRICAN AMERICAN: 85 mL/min — AB (ref >90)
Glucose, Bld: 108 mg/dL — ABNORMAL HIGH (ref 70–99)
Potassium: 5 mEq/L (ref 3.7–5.3)
Sodium: 137 mEq/L (ref 137–147)

## 2014-01-07 LAB — CBC
HEMATOCRIT: 34.1 % — AB (ref 36.0–46.0)
HEMOGLOBIN: 10.8 g/dL — AB (ref 12.0–15.0)
MCH: 29 pg (ref 26.0–34.0)
MCHC: 31.7 g/dL (ref 30.0–36.0)
MCV: 91.7 fL (ref 78.0–100.0)
Platelets: 485 10*3/uL — ABNORMAL HIGH (ref 150–400)
RBC: 3.72 MIL/uL — ABNORMAL LOW (ref 3.87–5.11)
RDW: 16.4 % — ABNORMAL HIGH (ref 11.5–15.5)
WBC: 11.7 10*3/uL — ABNORMAL HIGH (ref 4.0–10.5)

## 2014-01-07 LAB — I-STAT TROPONIN, ED: TROPONIN I, POC: 0.01 ng/mL (ref 0.00–0.08)

## 2014-01-07 MED ORDER — VANCOMYCIN HCL IN DEXTROSE 1-5 GM/200ML-% IV SOLN
1000.0000 mg | Freq: Once | INTRAVENOUS | Status: AC
  Administered 2014-01-08: 1000 mg via INTRAVENOUS
  Filled 2014-01-07: qty 200

## 2014-01-07 MED ORDER — DILTIAZEM HCL 100 MG IV SOLR
5.0000 mg/h | Freq: Once | INTRAVENOUS | Status: AC
  Administered 2014-01-07: 5 mg/h via INTRAVENOUS

## 2014-01-07 MED ORDER — LEVOFLOXACIN IN D5W 750 MG/150ML IV SOLN
750.0000 mg | Freq: Once | INTRAVENOUS | Status: AC
  Administered 2014-01-08: 750 mg via INTRAVENOUS
  Filled 2014-01-07: qty 150

## 2014-01-07 NOTE — ED Notes (Signed)
Pt released from hospital 2 weeks ago for new onset afib, symptomatic w/shortness of breath and palpitations.  Pt arrives awake, alert, oriented, nauseated.

## 2014-01-07 NOTE — Telephone Encounter (Signed)
NP from [facility] called stated pt. Was ashy in color and her heart rate was 130 bpm her BP was 110/60  The staff had started her on 02 @3  liters and her pulse ox was 98% , I gave them instructions to send her to the Hospital , but was told that pt. Didn't want to go to the hospital that she wanted to come to the office and be seen, I offered them an appt. To see Dr. [place] at 4:15 pm but was told by the staff that their driver couldn't bring her here that late.

## 2014-01-07 NOTE — Progress Notes (Signed)
01/07/2014 Julie Haas   1925/10/23  [id number]  Primary Physicia GREEN, Viviann Spare, MD Primary Cardiologist: Dr Percival Spanish  HPI:  Julie Haas is an 80 year old seen as a work in today for evaluation of atrial fibrillation with RVR and CHF. She has a history of  atrial fibrillation on Eliquis, previously hospitalized from 09/24/13 with pneumonia/A. fib with RVR, cardioverted on 10/01/13 successfully however reverted back to atrial fibrillation on 10/05/13. She is currently living at [facility]. She was admitted with respiratory failure and acute CHF on 11/09/13. She was hospitalized till 11/22/13. TEE during that admission showed LA smoke. Amiodarone was added with plans for eventual cardioversion. She was seen by Cecilie Kicks 12/06/13 and noted to be volume overloaded. Her wgt was 203. Metolazone 2.5 mg Tues/ Friday was added. She was seen by Kerin Ransom on 12/21/13 and was doing better. Weight was down to 173 lbs. Amiodarone dose was reduced to 400 mg daily. She was scheduled for a DCCV but subsequently refused to have it done. Since then amiodarone was reduced further to 200 mg daily. Metolazone was stopped.  She was sent from [facility] today when pulse noted to be elevated to 130. Patient is seen with her son. Increased dyspnea and edema noted. Patient refused to go to ED. States she wants to join her husband who has passed and doesn't want aggressive interventions.      Medication List       This list is accurate as of: 01/07/14  6:47 PM.  Always use your most recent med list.               albuterol (2.5 MG/3ML) 0.083% nebulizer solution  Commonly known as:  PROVENTIL  Take 2.5 mg by nebulization every 6 (six) hours as needed for wheezing or shortness of breath.     amiodarone 200 MG tablet  Commonly known as:  PACERONE  Take 200 mg by mouth daily.     apixaban 5 MG Tabs tablet  Commonly known as:  ELIQUIS  Take 1 tablet (5 mg total) by mouth 2 (two) times daily.       furosemide 80 MG tablet  Commonly known as:  LASIX  Take 80 mg by mouth 2 (two) times daily.     guaiFENesin-dextromethorphan 100-10 MG/5ML syrup  Commonly known as:  ROBITUSSIN DM  Take 10 mLs by mouth every 6 (six) hours as needed for cough.     ipratropium 0.02 % nebulizer solution  Commonly known as:  ATROVENT  Inhale 0.2 mg into the lungs every 4 (four) hours as needed for wheezing or shortness of breath.     KLOR-CON M20 20 MEQ tablet  Generic drug:  potassium chloride SA  Take 100 mEq by mouth daily.     levalbuterol 0.63 MG/3ML nebulizer solution  Commonly known as:  XOPENEX  Take 3 mLs (0.63 mg total) by nebulization every 4 (four) hours as needed for wheezing or shortness of breath.     metolazone 2.5 MG tablet  Commonly known as:  ZAROXOLYN  Take 2.5 mg by mouth daily.     metoprolol 50 MG tablet  Commonly known as:  LOPRESSOR  Take 1 tablet (50 mg total) by mouth 2 (two) times daily.     PRESERVISION AREDS Caps  Take 1 capsule by mouth 2 (two) times daily.     sertraline 25 MG tablet  Commonly known as:  ZOLOFT  Take 0.5 tablets (12.5 mg total) by mouth  daily.     vitamin E 1000 UNIT capsule  Take 1,000 Units by mouth daily.       Past Medical History  Diagnosis Date  . Hyperlipidemia   . Arrhythmia     PVC  . Retinal detachment   . Renal disorder   . Allergy   . Occlusion and stenosis of carotid artery without mention of cerebral infarction 03/24/2012  . Pneumonia, organism unspecified 11/192013  . Depressive disorder, not elsewhere classified 03/05/2011  . Other premature beats 03/05/2011  . Other seborrheic keratosis 11/20/2010  . Syncope and collapse 10/02/2010  . Palpitations 11/21/2009  . Unspecified essential hypertension 06/27/2009  . Cardiomegaly 06/27/2009  . Other generalized ischemic cerebrovascular disease 06/27/2009  . Dizziness and giddiness 06/27/2009  . Unspecified vitamin D deficiency 05/16/2009  . Family history of sudden cardiac  death (SCD) 24-Dec-2008  . Rash and other nonspecific skin eruption 07/05/2008  . Edema 03/15/2008  . Obesity, unspecified 07/25/2006  . Varicose veins of lower extremities with inflammation 03/16/2003  . Shortness of breath   . Atrial fibrillation, persistent      Allergies  Allergen Reactions  . Codeine     Unknown  . Cucumber Extract Nausea And Vomiting  . Diuretic [Buchu-Cornsilk-Ch Grass-Hydran]     Unknown  . Lipitor [Atorvastatin Calcium]     Unknown  . Penicillins Other (See Comments)    unknown  . Pravachol     Unknown    History   Social History  . Marital Status: Widowed    Spouse Name: N/A    Number of Children: N/A  . Years of Education: N/A   Occupational History  . Housewife    Social History Main Topics  . Smoking status: Never Smoker   . Smokeless tobacco: Never Used  . Alcohol Use: No  . Drug Use: No  . Sexual Activity: No   Other Topics Concern  . Not on file   Social History Narrative   The patient is widowed, husband died 03/20/2011.  She has two children and two     grandchildren.     She never smoked cigarettes    Does not drink alcohol.    Drinks 2 cups caffeine coffee,    Lives at [facility] since 09/03/2004   Living Will   Exercise: walks daily           Review of Systems: General: negative for chills, fever, night sweats. Weight this am up to 188 lbs.  Cardiovascular: positive for dyspnea on exertion, edema, orthopnea,  paroxysmal nocturnal dyspnea or shortness of breath Dermatological: negative for rash Respiratory: negative for cough or wheezing Urologic: negative for hematuria Abdominal: negative for nausea, vomiting, diarrhea, bright red blood per rectum, melena, or hematemesis Neurologic: negative for visual changes, syncope, or dizziness All other systems reviewed and are otherwise negative except as noted above.    Blood pressure 106/70, pulse 126.  GENERAL:  She is an elderly WF seen on a stretcher. She is  dyspneic at rest with oxygen on.  HEENT:  PERRL, EOMI, sclera are clear. Oropharynx is clear. NECK:  Jugular venous distention to 10 cm, carotid upstroke brisk and symmetric, no bruits, no thyromegaly or adenopathy LUNGS:  Bilateral diffuse rales 1/2 up lung fields.  CHEST:  Unremarkable HEART:  IRRR, tachy,  PMI not displaced or sustained,S1 and S2 within normal limits, no S3, no S4: no clicks, no rubs, no murmurs ABD:  Obese, Soft, nontender. BS +, no masses or bruits.  No hepatomegaly, no splenomegaly EXT:  2 + pulses throughout, 2+ edema, no cyanosis no clubbing SKIN:  Warm and dry.  No rashes NEURO:  Alert and oriented x 3. Cranial nerves II through XII intact. PSYCH:  Cognitively intact    EKG AF with VR 126, ST-T wave changes consistent with lateral ischemia.I have personally reviewed and interpreted this study.   Lab Results  Component Value Date   WBC 9.1 11/30/2013   HGB 13.6 12/23/2013   HCT 40.0 12/23/2013   PLT 360 11/30/2013   GLUCOSE 99 12/23/2013   CHOL 191 09/25/2013   TRIG 48 09/25/2013   HDL 86 09/25/2013   LDLCALC 95 09/25/2013   ALT 19 11/17/2013   AST 21 11/17/2013   NA 138 01/03/2014   K 3.6 01/03/2014   CL 90* 11/22/2013   CREATININE 0.8 01/03/2014   BUN 14 01/03/2014   CO2 36* 11/22/2013   TSH 2.580 09/24/2013   INR 1.86* 11/17/2013   Transthoracic Echocardiography  Patient:  Adalyn, Pennock MR #:    [id number] Study Date: 09/24/2013 Gender:   F Age:    81 Height:   157.5 cm Weight:   95.3 kg BSA:    2.09 m^2 Pt. Status: Room:    2W10C  SONOGRAPHER Mauricio Po, RDCS, CCT ATTENDING  Kate Sable, MD PERFORMING  [facility], Inpatient ADMITTING  Vora, Amit N ORDERING   Vora, Amit N  cc:  ------------------------------------------------------------------- LV EF: 55% -  60%  ------------------------------------------------------------------- Indications:   Atrial fibrillation - currently SR  427.31.  ------------------------------------------------------------------- History:  PMH:  Syncope. Risk factors: Hypertension. Dyslipidemia.  ------------------------------------------------------------------- Study Conclusions  - Left ventricle: The cavity size was normal. Wall thickness was increased in a pattern of mild LVH. Systolic function was normal. The estimated ejection fraction was in the range of 55% to 60%. - Aortic valve: There was mild stenosis. Valve area (VTI): 1.34 cm^2. Valve area (Vmax): 1.23 cm^2. - Mitral valve: Calcified annulus. There was mild regurgitation. - Left atrium: The atrium was moderately dilated. - Atrial septum: No defect or patent foramen ovale was identified.  Transthoracic echocardiography. M-mode, complete 2D, spectral Doppler, and color Doppler. Birthdate: Patient birthdate: August 19, 1925. Age: Patient is 81 yr old. Sex: Gender: female. BMI: 38.4 kg/m^2. Blood pressure:   122/60 Patient status: Inpatient. Study date: Study date: 09/24/2013. Study time: 04:53 PM. Location: Bedside.  -------------------------------------------------------------------  ------------------------------------------------------------------- Left ventricle: The cavity size was normal. Wall thickness was increased in a pattern of mild LVH. Systolic function was normal. The estimated ejection fraction was in the range of 55% to 60%.  ------------------------------------------------------------------- Aortic valve:  Moderately calcified leaflets. Doppler:  There was mild stenosis.   Valve area (VTI): 1.34 cm^2. Indexed valve area (VTI): 0.64 cm^2/m^2. Valve area (Vmax): 1.23 cm^2. Indexed valve area (Vmax): 0.59 cm^2/m^2.  Mean gradient (S): 14 mm Hg. Peak gradient (S): 22 mm Hg.  ------------------------------------------------------------------- Aorta: The aorta was normal, not dilated, and  non-diseased.  ------------------------------------------------------------------- Mitral valve:  Calcified annulus. Doppler: There was mild regurgitation.  ------------------------------------------------------------------- Left atrium: The atrium was moderately dilated.  ------------------------------------------------------------------- Atrial septum: No defect or patent foramen ovale was identified.  ------------------------------------------------------------------- Right ventricle: The cavity size was normal. Wall thickness was normal. Systolic function was normal.  ------------------------------------------------------------------- Pulmonic valve:  Structurally normal valve.  Cusp separation was normal. Doppler: Transvalvular velocity was within the normal range. There was trivial regurgitation.  ------------------------------------------------------------------- Tricuspid valve:  Doppler: There was mild regurgitation.  ------------------------------------------------------------------- Right atrium: The atrium was normal in  size.  ------------------------------------------------------------------- Pericardium: The pericardium was normal in appearance.  ------------------------------------------------------------------- Post procedure conclusions Ascending Aorta:  - The aorta was normal, not dilated, and non-diseased.  ------------------------------------------------------------------- Measurements  Left ventricle         Value     09/10/2010 Reference LV ID, ED, PLAX        43.2 mm    46.92   43 - 52 chordal LV PW thickness, ED      11  mm    14.06   --------- IVS/LV PW ratio, ED      1       0.9    <=1.3 Stroke volume, 2D       66  ml    ---------- --------- Stroke volume/bsa, 2D     32  ml/m^2  ---------- ---------  Ventricular septum       Value      09/10/2010 Reference IVS thickness, ED       11  mm    12.66   ---------  LVOT              Value     09/10/2010 Reference LVOT ID, S           23  mm    ---------- --------- LVOT area           4.15 cm^2   ---------- ---------  Aortic valve          Value     09/10/2010 Reference Aortic valve peak       237  cm/s   ---------- --------- velocity, S Aortic valve mean       172  cm/s   ---------- --------- velocity, S Aortic valve VTI, S      45.4 cm    ---------- --------- Aortic mean gradient,     14  mm Hg  ---------- --------- S Aortic peak gradient,     22  mm Hg  ---------- --------- S Aortic valve area, VTI     1.34 cm^2   ---------- --------- Aortic valve area/bsa,     0.64 cm^2/m^2 ---------- --------- VTI Aortic valve area,       1.23 cm^2   ---------- --------- peak velocity Aortic valve area/bsa,     0.59 cm^2/m^2 ---------- --------- peak velocity  Aorta             Value     09/10/2010 Reference Aortic root ID, ED       27  mm    29     ---------  Left atrium          Value     09/10/2010 Reference LA ID, A-P, ES         48  mm    32     --------- LA ID/bsa, A-P     (H)   2.3  cm/m^2  1.67    <=2.2 LA volume, S          75  ml    ---------- --------- LA volume/bsa, S        35.9 ml/m^2  ---------- ---------  Systemic veins         Value     09/10/2010 Reference Estimated CVP         3   mm Hg  ---------- ---------  Legend: (L) and (H) mark values outside specified reference range.  ------------------------------------------------------------------- Prepared and Electronically Authenticated by  Jenkins Rouge, M.D. 2015-08-07T18:03:45  ASSESSMENT AND  PLAN:  1. Atrial fibrillation with RVR. Patient refuses hospitalization. Does not want DCCV. Rate poorly controlled which is exacerbating her CHF. Will increase amiodarone back to 400 mg daily. Increase metoprolol to 75 mg bid. Continue Eliquis. Follow up early next week to see if we can convince her to undergo cardioversion.  2. Acute on chronic diastolic CHF. Markedly edematous and significant pulmonary edema on exam. Related to AFib with RVR. I have recommended hospitalization for rate control with IV cardizem, IV diuresis and DCCV. She refuses. States she is ready to die. Is willing to let me change her oral meds. I explained that if we could restore NSR this would significantly help her CHF. Will increase rate control as noted. Give metolazone 2.5 mg daily for the next 4 days. She previously had a good diuretic response to this. Follow up early next week with CBC, BMET and BNP. Continue lasix 80 mg bid.   3. DNR. Patient states she is ready to die. Does not want to go back to hospital. If above measures do not help would recommend comfort care and hospice. Unless we can treat arrhythmia with DCCV and manage CHF her prognosis is poor and she clearly understands this.

## 2014-01-07 NOTE — Telephone Encounter (Signed)
I called [facility] back and was told she was doing better that her heart rate had come down to 100 and she was breathing a lot easier , but still wanted to come in to be seen , Pt. to come in and be seen by Dr . [place] at 4:15 today. Staff at facility stated they would have her here at that time.

## 2014-01-07 NOTE — ED Provider Notes (Signed)
CSN: [id number]     Arrival date & time 01/07/14  2229 History  This chart was scribed for Julianne Rice, MD by Peyton Bottoms, ED Scribe. This patient was seen in room A04C/A04C and the patient's care was started at 11:16 PM.   Chief Complaint  Patient presents with  . Atrial Fibrillation   Patient is a 80 y.o. female presenting with atrial fibrillation. The history is provided by the patient. No language interpreter was used.  Atrial Fibrillation This is a new problem. The current episode started 3 to 5 hours ago. Associated symptoms include shortness of breath. Pertinent negatives include no chest pain, no abdominal pain and no headaches. Nothing aggravates the symptoms. Nothing relieves the symptoms. She has tried nothing for the symptoms.   HPI Comments: Julie Haas is a 80 y.o. female with a history of arrhythmia, pneumonia, palpitations, cardiomegaly and edema, who presents to the Emergency Department complaining of  Atrial Fibrillation that began this morning. She states that she went to her PCP this morning. She reports associated gradually worsening SOB, palpitations, rhinorrhea, and cough. She states that her symptoms have been similar in the past when she has had Afib before. She denies associated sore throat, and chills. She states that she uses oxygen at a baseline of 3 liters at home. She states that edema in her legs is baseline.   Past Medical History  Diagnosis Date  . Hyperlipidemia   . Arrhythmia     PVC  . Retinal detachment   . Renal disorder   . Allergy   . Occlusion and stenosis of carotid artery without mention of cerebral infarction 03/24/2012  . Pneumonia, organism unspecified 11/192013  . Depressive disorder, not elsewhere classified 03/05/2011  . Other premature beats 03/05/2011  . Other seborrheic keratosis 11/20/2010  . Syncope and collapse 10/02/2010  . Palpitations 11/21/2009  . Unspecified essential hypertension 06/27/2009  . Cardiomegaly 06/27/2009  .  Other generalized ischemic cerebrovascular disease 06/27/2009  . Dizziness and giddiness 06/27/2009  . Unspecified vitamin D deficiency 05/16/2009  . Family history of sudden cardiac death (SCD) 12/10/2008  . Rash and other nonspecific skin eruption 07/05/2008  . Edema 03/15/2008  . Obesity, unspecified 07/25/2006  . Varicose veins of lower extremities with inflammation 03/16/2003  . Shortness of breath   . Atrial fibrillation, persistent    Past Surgical History  Procedure Laterality Date  . Cholecystectomy  1983  . Cataract extraction Bilateral     Dr. [name]  . Tonsillectomy    . Keratosis    . Retinal detachment surgery Left   . Cardioversion N/A 10/01/2013    Procedure: CARDIOVERSION;  Surgeon: Sanda Klein, MD;  Location: [facility] ENDOSCOPY;  Service: Cardiovascular;  Laterality: N/A;  . Tee without cardioversion N/A 10/01/2013    Procedure: TRANSESOPHAGEAL ECHOCARDIOGRAM (TEE);  Surgeon: Sanda Klein, MD;  Location: [facility] ENDOSCOPY;  Service: Cardiovascular;  Laterality: N/A;  . Tee without cardioversion N/A 11/15/2013    Procedure: TRANSESOPHAGEAL ECHOCARDIOGRAM (TEE);  Surgeon: Fay Records, MD;  Location: [facility] ENDOSCOPY;  Service: Cardiovascular;  Laterality: N/A;   Family History  Problem Relation Age of Onset  . Arrhythmia      Long QT in first degree relatives,  . Heart attack Father   . Heart disease Father   . Heart attack Brother   . Heart disease Brother   . Pneumonia Mother    History  Substance Use Topics  . Smoking status: Never Smoker   . Smokeless tobacco: Never Used  .  Alcohol Use: No   OB History    No data available     Review of Systems  Constitutional: Negative for fever and chills.  HENT: Negative for rhinorrhea and sore throat.   Respiratory: Positive for cough and shortness of breath.   Cardiovascular: Positive for palpitations and leg swelling. Negative for chest pain.  Gastrointestinal: Negative for nausea, vomiting, abdominal pain, diarrhea and  constipation.  Musculoskeletal: Negative for myalgias, back pain, neck pain and neck stiffness.  Skin: Negative for rash and wound.  Neurological: Negative for dizziness, weakness, light-headedness, numbness and headaches.  All other systems reviewed and are negative.  Allergies  Codeine; Cucumber extract; Diuretic; Lipitor; Penicillins; and Pravachol  Home Medications   Prior to Admission medications   Medication Sig Start Date End Date Taking? Authorizing Provider  albuterol (PROVENTIL) (2.5 MG/3ML) 0.083% nebulizer solution Take 2.5 mg by nebulization every 6 (six) hours as needed for wheezing or shortness of breath.   Yes Historical Provider, MD  amiodarone (PACERONE) 200 MG tablet Take 200 mg by mouth daily.   Yes Historical Provider, MD  apixaban (ELIQUIS) 5 MG TABS tablet Take 1 tablet (5 mg total) by mouth 2 (two) times daily. 10/02/13  Yes [name], PA-C  furosemide (LASIX) 80 MG tablet Take 80 mg by mouth 2 (two) times daily.   Yes Historical Provider, MD  guaiFENesin-dextromethorphan (ROBITUSSIN DM) 100-10 MG/5ML syrup Take 10 mLs by mouth every 6 (six) hours as needed for cough.   Yes Historical Provider, MD  ipratropium (ATROVENT) 0.02 % nebulizer solution Inhale 0.2 mg into the lungs every 4 (four) hours as needed for wheezing or shortness of breath.  11/23/13  Yes Historical Provider, MD  levalbuterol (XOPENEX) 0.63 MG/3ML nebulizer solution Take 3 mLs (0.63 mg total) by nebulization every 4 (four) hours as needed for wheezing or shortness of breath. 10/20/13  Yes Geradine Girt, DO  metolazone (ZAROXOLYN) 2.5 MG tablet Take 2.5 mg by mouth daily.   Yes Historical Provider, MD  metoprolol (LOPRESSOR) 50 MG tablet Take 1 tablet (50 mg total) by mouth 2 (two) times daily. Patient taking differently: Take 75 mg by mouth 2 (two) times daily.  11/22/13  Yes Nita Sells, MD  omeprazole (PRILOSEC) 20 MG capsule Take 20 mg by mouth daily.   Yes Historical Provider, MD   potassium chloride SA (KLOR-CON M20) 20 MEQ tablet Take 100 mEq by mouth daily.    Yes Historical Provider, MD  promethazine (PHENERGAN) 12.5 MG tablet Take 12.5 mg by mouth every 6 (six) hours as needed for nausea or vomiting.   Yes Historical Provider, MD  vitamin E 1000 UNIT capsule Take 1,000 Units by mouth daily.   Yes Historical Provider, MD  Multiple Vitamins-Minerals (PRESERVISION AREDS) CAPS Take 1 capsule by mouth 2 (two) times daily.    Historical Provider, MD  sertraline (ZOLOFT) 25 MG tablet Take 0.5 tablets (12.5 mg total) by mouth daily. Patient taking differently: Take 25 mg by mouth daily.  11/22/13   Nita Sells, MD   Triage Vitals: BP 113/73 mmHg  Pulse 122  Temp(Src) 100.2 F (37.9 C) (Oral)  Resp 34  SpO2 91%  Physical Exam  Constitutional: She is oriented to person, place, and time. She appears well-developed and well-nourished. No distress.  HENT:  Head: Normocephalic and atraumatic.  Mouth/Throat: Oropharynx is clear and moist.  Eyes: EOM are normal. Pupils are equal, round, and reactive to light.  Neck: Normal range of motion. Neck supple.  Cardiovascular:  Tachycardia irregularly irregular rhythm  Pulmonary/Chest: She is in respiratory distress. She has no wheezes. She has rales (crackles in all lung fields.).  Speaking in full sentences. Tachypnea.  Abdominal: Soft. Bowel sounds are normal. She exhibits no distension and no mass. There is no tenderness. There is no rebound and no guarding.  Musculoskeletal: Normal range of motion. She exhibits edema (bilateral 2+ edema to mid calf.). She exhibits no tenderness.  Neurological: She is alert and oriented to person, place, and time.  Moves all extremities without deficit. Sensation is grossly intact.  Skin: Skin is warm and dry. No rash noted. No erythema.  Psychiatric: She has a normal mood and affect. Her behavior is normal.  Nursing note and vitals reviewed.   ED Course  Procedures (including  critical care time)  DIAGNOSTIC STUDIES: Oxygen Saturation is 91% on RA, low by my interpretation.    COORDINATION OF CARE: 11:18 PM- Discussed plans to order diagnostic CXR, EKG and lab work. Pt advised of plan for treatment and pt agrees.  Labs Review Labs Reviewed  BASIC METABOLIC PANEL - Abnormal; Notable for the following:    Chloride 94 (*)    Glucose, Bld 108 (*)    GFR calc non Af Amer 74 (*)    GFR calc Af Amer 85 (*)    All other components within normal limits  CBC - Abnormal; Notable for the following:    WBC 11.7 (*)    RBC 3.72 (*)    Hemoglobin 10.8 (*)    HCT 34.1 (*)    RDW 16.4 (*)    Platelets 485 (*)    All other components within normal limits  PRO B NATRIURETIC PEPTIDE - Abnormal; Notable for the following:    Pro B Natriuretic peptide (BNP) 4510.0 (*)    All other components within normal limits  PROTIME-INR - Abnormal; Notable for the following:    Prothrombin Time 18.4 (*)    INR 1.52 (*)    All other components within normal limits  HEPATIC FUNCTION PANEL - Abnormal; Notable for the following:    Albumin 2.6 (*)    AST 44 (*)    ALT 41 (*)    All other components within normal limits  URINALYSIS, ROUTINE W REFLEX MICROSCOPIC - Abnormal; Notable for the following:    Leukocytes, UA TRACE (*)    All other components within normal limits  URINE MICROSCOPIC-ADD ON - Abnormal; Notable for the following:    Casts HYALINE CASTS (*)    All other components within normal limits  COMPREHENSIVE METABOLIC PANEL - Abnormal; Notable for the following:    Sodium 135 (*)    Chloride 92 (*)    Glucose, Bld 113 (*)    Albumin 2.2 (*)    AST 43 (*)    ALT 36 (*)    GFR calc non Af Amer 75 (*)    GFR calc Af Amer 87 (*)    Anion gap 19 (*)    All other components within normal limits  CBC WITH DIFFERENTIAL - Abnormal; Notable for the following:    RBC 3.66 (*)    Hemoglobin 10.4 (*)    HCT 33.7 (*)    RDW 16.5 (*)    Lymphocytes Relative 11 (*)    All  other components within normal limits  CULTURE, BLOOD (ROUTINE X 2)  CULTURE, BLOOD (ROUTINE X 2)  URINE CULTURE  CULTURE, EXPECTORATED SPUTUM-ASSESSMENT  GRAM STAIN  RESPIRATORY VIRUS PANEL  TROPONIN I  STREP PNEUMONIAE URINARY ANTIGEN  TROPONIN I  TROPONIN I  LEGIONELLA ANTIGEN, URINE  I-STAT TROPOININ, ED  I-STAT CG4 LACTIC ACID, ED   Imaging Review Dg Chest Port 1 View  01/07/2014   CLINICAL DATA:  Shortness of breath and palpitations. Recent hospitalization for new onset atrial fibrillation. Subsequent encounter.  EXAM: PORTABLE CHEST - 1 VIEW  COMPARISON:  11/17/2013 and 11/10/2013 radiographs; CT 10/11/2013.  FINDINGS: 2255 hr. There is stable cardiomegaly and aortic atherosclerosis. Small bilateral pleural effusions are present. There are bilateral airspace opacities which are similar to the most recent examination. These have increased from radiographs obtained 3 months ago. These were evaluated by CT in August and appear progressive since then. Left glenohumeral degenerative changes are present. There are no acute osseous findings. Several telemetry leads overlie the chest.  IMPRESSION: Persistent diffuse bilateral airspace opacities and bilateral pleural effusions, similar to most recent examination of 6 weeks ago, but progressive over the last several months. Although there may be a component of edema, the findings are suspicious for an infectious or inflammatory process. Correlate clinically.   Electronically Signed   By: Camie Patience M.D.   On: 01/07/2014 23:11     EKG Interpretation None      CRITICAL CARE Performed by: Lita Mains, Cameran Pettey Total critical care time: 30 min Critical care time was exclusive of separately billable procedures and treating other patients. Critical care was necessary to treat or prevent imminent or life-threatening deterioration. Critical care was time spent personally by me on the following activities: development of treatment plan with patient  and/or surrogate as well as nursing, discussions with consultants, evaluation of patient's response to treatment, examination of patient, obtaining history from patient or surrogate, ordering and performing treatments and interventions, ordering and review of laboratory studies, ordering and review of radiographic studies, pulse oximetry and re-evaluation of patient's condition.  MDM   Final diagnoses:  Atrial fibrillation with RVR  Acute on chronic congestive heart failure, unspecified congestive heart failure type  Healthcare-associated pneumonia    Patient with mixed clinical picture. She has atrial fibrillation with RVR. Patient also appears to be mildly fluid overloaded. She also has and signs and symptoms of a probable pneumonia.  Patient started on BiPAP to assist with breathing. Discussed with [facility] hospitalist will admit to step down bed.  I personally performed the services described in this documentation, which was scribed in my presence. The recorded information has been reviewed and is accurate.  Julianne Rice, MD 01/08/14 [phone]

## 2014-01-08 DIAGNOSIS — I5033 Acute on chronic diastolic (congestive) heart failure: Principal | ICD-10-CM

## 2014-01-08 DIAGNOSIS — F329 Major depressive disorder, single episode, unspecified: Secondary | ICD-10-CM | POA: Diagnosis present

## 2014-01-08 DIAGNOSIS — N289 Disorder of kidney and ureter, unspecified: Secondary | ICD-10-CM | POA: Diagnosis present

## 2014-01-08 DIAGNOSIS — I350 Nonrheumatic aortic (valve) stenosis: Secondary | ICD-10-CM | POA: Diagnosis present

## 2014-01-08 DIAGNOSIS — I481 Persistent atrial fibrillation: Secondary | ICD-10-CM | POA: Diagnosis present

## 2014-01-08 DIAGNOSIS — Z7901 Long term (current) use of anticoagulants: Secondary | ICD-10-CM | POA: Diagnosis not present

## 2014-01-08 DIAGNOSIS — Z9842 Cataract extraction status, left eye: Secondary | ICD-10-CM | POA: Diagnosis not present

## 2014-01-08 DIAGNOSIS — R0602 Shortness of breath: Secondary | ICD-10-CM | POA: Diagnosis present

## 2014-01-08 DIAGNOSIS — I509 Heart failure, unspecified: Secondary | ICD-10-CM

## 2014-01-08 DIAGNOSIS — K219 Gastro-esophageal reflux disease without esophagitis: Secondary | ICD-10-CM | POA: Diagnosis present

## 2014-01-08 DIAGNOSIS — J189 Pneumonia, unspecified organism: Secondary | ICD-10-CM | POA: Diagnosis present

## 2014-01-08 DIAGNOSIS — Z888 Allergy status to other drugs, medicaments and biological substances status: Secondary | ICD-10-CM | POA: Diagnosis not present

## 2014-01-08 DIAGNOSIS — Z515 Encounter for palliative care: Secondary | ICD-10-CM | POA: Diagnosis not present

## 2014-01-08 DIAGNOSIS — I8393 Asymptomatic varicose veins of bilateral lower extremities: Secondary | ICD-10-CM | POA: Diagnosis present

## 2014-01-08 DIAGNOSIS — D638 Anemia in other chronic diseases classified elsewhere: Secondary | ICD-10-CM | POA: Diagnosis present

## 2014-01-08 DIAGNOSIS — Z9841 Cataract extraction status, right eye: Secondary | ICD-10-CM | POA: Diagnosis not present

## 2014-01-08 DIAGNOSIS — K210 Gastro-esophageal reflux disease with esophagitis: Secondary | ICD-10-CM

## 2014-01-08 DIAGNOSIS — Z885 Allergy status to narcotic agent status: Secondary | ICD-10-CM | POA: Diagnosis not present

## 2014-01-08 DIAGNOSIS — I5030 Unspecified diastolic (congestive) heart failure: Secondary | ICD-10-CM | POA: Insufficient documentation

## 2014-01-08 DIAGNOSIS — I4891 Unspecified atrial fibrillation: Secondary | ICD-10-CM

## 2014-01-08 DIAGNOSIS — Y95 Nosocomial condition: Secondary | ICD-10-CM | POA: Diagnosis present

## 2014-01-08 DIAGNOSIS — Z66 Do not resuscitate: Secondary | ICD-10-CM | POA: Diagnosis present

## 2014-01-08 DIAGNOSIS — E785 Hyperlipidemia, unspecified: Secondary | ICD-10-CM | POA: Diagnosis present

## 2014-01-08 DIAGNOSIS — E669 Obesity, unspecified: Secondary | ICD-10-CM | POA: Diagnosis present

## 2014-01-08 DIAGNOSIS — Z6829 Body mass index (BMI) 29.0-29.9, adult: Secondary | ICD-10-CM | POA: Diagnosis not present

## 2014-01-08 DIAGNOSIS — I10 Essential (primary) hypertension: Secondary | ICD-10-CM | POA: Diagnosis present

## 2014-01-08 DIAGNOSIS — Z880 Allergy status to penicillin: Secondary | ICD-10-CM | POA: Diagnosis not present

## 2014-01-08 LAB — COMPREHENSIVE METABOLIC PANEL
ALK PHOS: 78 U/L (ref 39–117)
ALT: 36 U/L — AB (ref 0–35)
AST: 43 U/L — ABNORMAL HIGH (ref 0–37)
Albumin: 2.2 g/dL — ABNORMAL LOW (ref 3.5–5.2)
Anion gap: 19 — ABNORMAL HIGH (ref 5–15)
BUN: 16 mg/dL (ref 6–23)
CHLORIDE: 92 meq/L — AB (ref 96–112)
CO2: 24 mEq/L (ref 19–32)
Calcium: 8.5 mg/dL (ref 8.4–10.5)
Creatinine, Ser: 0.74 mg/dL (ref 0.50–1.10)
GFR calc Af Amer: 87 mL/min — ABNORMAL LOW (ref >90)
GFR calc non Af Amer: 75 mL/min — ABNORMAL LOW (ref >90)
GLUCOSE: 113 mg/dL — AB (ref 70–99)
POTASSIUM: 4.5 meq/L (ref 3.7–5.3)
SODIUM: 135 meq/L — AB (ref 137–147)
TOTAL PROTEIN: 7 g/dL (ref 6.0–8.3)
Total Bilirubin: 0.5 mg/dL (ref 0.3–1.2)

## 2014-01-08 LAB — TROPONIN I
Troponin I: <0.3 ng/mL (ref <0.30)
Troponin I: <0.3 ng/mL (ref <0.30)
Troponin I: <0.3 ng/mL (ref <0.30)

## 2014-01-08 LAB — HEPATIC FUNCTION PANEL
ALT: 41 U/L — ABNORMAL HIGH (ref 0–35)
AST: 44 U/L — ABNORMAL HIGH (ref 0–37)
Albumin: 2.6 g/dL — ABNORMAL LOW (ref 3.5–5.2)
Alkaline Phosphatase: 91 U/L (ref 39–117)
TOTAL PROTEIN: 7.6 g/dL (ref 6.0–8.3)
Total Bilirubin: 0.5 mg/dL (ref 0.3–1.2)

## 2014-01-08 LAB — CBC WITH DIFFERENTIAL/PLATELET
BASOS PCT: 0 % (ref 0–1)
Basophils Absolute: 0 10*3/uL (ref 0.0–0.1)
EOS ABS: 0.1 10*3/uL (ref 0.0–0.7)
Eosinophils Relative: 1 % (ref 0–5)
HCT: 33.7 % — ABNORMAL LOW (ref 36.0–46.0)
Hemoglobin: 10.4 g/dL — ABNORMAL LOW (ref 12.0–15.0)
Lymphocytes Relative: 11 % — ABNORMAL LOW (ref 12–46)
Lymphs Abs: 1.1 10*3/uL (ref 0.7–4.0)
MCH: 28.4 pg (ref 26.0–34.0)
MCHC: 30.9 g/dL (ref 30.0–36.0)
MCV: 92.1 fL (ref 78.0–100.0)
MONO ABS: 1 10*3/uL (ref 0.1–1.0)
MONOS PCT: 11 % (ref 3–12)
NEUTROS PCT: 77 % (ref 43–77)
Neutro Abs: 7.7 10*3/uL (ref 1.7–7.7)
Platelets: 381 10*3/uL (ref 150–400)
RBC: 3.66 MIL/uL — ABNORMAL LOW (ref 3.87–5.11)
RDW: 16.5 % — ABNORMAL HIGH (ref 11.5–15.5)
WBC: 9.9 10*3/uL (ref 4.0–10.5)

## 2014-01-08 LAB — URINALYSIS, ROUTINE W REFLEX MICROSCOPIC
BILIRUBIN URINE: NEGATIVE
Glucose, UA: NEGATIVE mg/dL
Hgb urine dipstick: NEGATIVE
Ketones, ur: NEGATIVE mg/dL
NITRITE: NEGATIVE
Protein, ur: NEGATIVE mg/dL
SPECIFIC GRAVITY, URINE: 1.009 (ref 1.005–1.030)
Urobilinogen, UA: 1 mg/dL (ref 0.0–1.0)
pH: 6.5 (ref 5.0–8.0)

## 2014-01-08 LAB — URINE MICROSCOPIC-ADD ON

## 2014-01-08 LAB — PRO B NATRIURETIC PEPTIDE: Pro B Natriuretic peptide (BNP): 4510 pg/mL — ABNORMAL HIGH (ref 0–450)

## 2014-01-08 LAB — STREP PNEUMONIAE URINARY ANTIGEN: Strep Pneumo Urinary Antigen: NEGATIVE

## 2014-01-08 LAB — MRSA PCR SCREENING: MRSA BY PCR: NEGATIVE

## 2014-01-08 MED ORDER — DILTIAZEM HCL 100 MG IV SOLR
5.0000 mg/h | INTRAVENOUS | Status: DC
  Filled 2014-01-08: qty 100

## 2014-01-08 MED ORDER — AMIODARONE HCL 200 MG PO TABS
200.0000 mg | ORAL_TABLET | Freq: Every day | ORAL | Status: DC
Start: 2014-01-08 — End: 2014-01-08
  Administered 2014-01-08: 200 mg via ORAL
  Filled 2014-01-08: qty 1

## 2014-01-08 MED ORDER — SODIUM CHLORIDE 0.9 % IV SOLN: 250.0000 mL | INTRAVENOUS | Status: DC | PRN

## 2014-01-08 MED ORDER — AMIODARONE HCL 200 MG PO TABS
200.0000 mg | ORAL_TABLET | Freq: Once | ORAL | Status: AC
  Administered 2014-01-08: 200 mg via ORAL
  Filled 2014-01-08: qty 1

## 2014-01-08 MED ORDER — METOPROLOL TARTRATE 50 MG PO TABS
50.0000 mg | ORAL_TABLET | Freq: Two times a day (BID) | ORAL | Status: DC
Start: 2014-01-08 — End: 2014-01-08

## 2014-01-08 MED ORDER — GUAIFENESIN-DM 100-10 MG/5ML PO SYRP
10.0000 mL | ORAL_SOLUTION | Freq: Four times a day (QID) | ORAL | Status: DC | PRN
Start: 2014-01-08 — End: 2014-01-09

## 2014-01-08 MED ORDER — SERTRALINE HCL 25 MG PO TABS
12.5000 mg | ORAL_TABLET | Freq: Every day | ORAL | Status: DC
  Administered 2014-01-08 – 2014-01-09 (×2): 12.5 mg via ORAL
  Filled 2014-01-08 (×2): qty 0.5

## 2014-01-08 MED ORDER — OCUVITE-LUTEIN PO CAPS
1.0000 | ORAL_CAPSULE | Freq: Two times a day (BID) | ORAL | Status: DC
  Administered 2014-01-08 – 2014-01-11 (×5): 1 via ORAL
  Filled 2014-01-08 (×8): qty 1

## 2014-01-08 MED ORDER — SODIUM CHLORIDE 0.9 % IJ SOLN
3.0000 mL | INTRAMUSCULAR | Status: DC | PRN
  Administered 2014-01-09: 3 mL via INTRAVENOUS
  Filled 2014-01-08: qty 3

## 2014-01-08 MED ORDER — IMIPENEM-CILASTATIN 500 MG IV SOLR
500.0000 mg | Freq: Three times a day (TID) | INTRAVENOUS | Status: DC
Start: 2014-01-08 — End: 2014-01-11
  Administered 2014-01-08 – 2014-01-11 (×10): 500 mg via INTRAVENOUS
  Filled 2014-01-08 (×14): qty 500

## 2014-01-08 MED ORDER — SODIUM CHLORIDE 0.9 % IV SOLN: INTRAVENOUS | Status: DC

## 2014-01-08 MED ORDER — PANTOPRAZOLE SODIUM 40 MG PO TBEC
40.0000 mg | DELAYED_RELEASE_TABLET | Freq: Every day | ORAL | Status: DC
  Administered 2014-01-08 – 2014-01-11 (×4): 40 mg via ORAL
  Filled 2014-01-08 (×4): qty 1

## 2014-01-08 MED ORDER — AMIODARONE HCL 200 MG PO TABS
400.0000 mg | ORAL_TABLET | Freq: Every day | ORAL | Status: DC
  Administered 2014-01-09 – 2014-01-11 (×3): 400 mg via ORAL
  Filled 2014-01-08 (×3): qty 2

## 2014-01-08 MED ORDER — METOPROLOL TARTRATE 50 MG PO TABS
50.0000 mg | ORAL_TABLET | Freq: Two times a day (BID) | ORAL | Status: DC
  Administered 2014-01-09 – 2014-01-10 (×3): 50 mg via ORAL
  Filled 2014-01-08 (×6): qty 1

## 2014-01-08 MED ORDER — FUROSEMIDE 10 MG/ML IJ SOLN
80.0000 mg | Freq: Two times a day (BID) | INTRAMUSCULAR | Status: DC
  Administered 2014-01-08 – 2014-01-11 (×5): 80 mg via INTRAVENOUS
  Filled 2014-01-08 (×6): qty 8

## 2014-01-08 MED ORDER — IPRATROPIUM BROMIDE 0.02 % IN SOLN: 0.2000 mg | RESPIRATORY_TRACT | Status: DC | PRN

## 2014-01-08 MED ORDER — ALBUTEROL SULFATE (2.5 MG/3ML) 0.083% IN NEBU
2.5000 mg | INHALATION_SOLUTION | Freq: Four times a day (QID) | RESPIRATORY_TRACT | Status: DC | PRN
  Administered 2014-01-11: 2.5 mg via RESPIRATORY_TRACT
  Filled 2014-01-08: qty 3

## 2014-01-08 MED ORDER — LEVALBUTEROL HCL 0.63 MG/3ML IN NEBU: 0.6300 mg | INHALATION_SOLUTION | RESPIRATORY_TRACT | Status: DC | PRN

## 2014-01-08 MED ORDER — SODIUM CHLORIDE 0.9 % IJ SOLN
3.0000 mL | Freq: Two times a day (BID) | INTRAMUSCULAR | Status: DC
  Administered 2014-01-08 – 2014-01-11 (×8): 3 mL via INTRAVENOUS

## 2014-01-08 MED ORDER — VANCOMYCIN HCL IN DEXTROSE 1-5 GM/200ML-% IV SOLN
1000.0000 mg | Freq: Two times a day (BID) | INTRAVENOUS | Status: DC
  Administered 2014-01-08 – 2014-01-11 (×6): 1000 mg via INTRAVENOUS
  Filled 2014-01-08 (×7): qty 200

## 2014-01-08 MED ORDER — APIXABAN 5 MG PO TABS
5.0000 mg | ORAL_TABLET | Freq: Two times a day (BID) | ORAL | Status: DC
  Administered 2014-01-08 – 2014-01-11 (×7): 5 mg via ORAL
  Filled 2014-01-08 (×7): qty 1

## 2014-01-08 MED ORDER — PROMETHAZINE HCL 25 MG PO TABS: 12.5000 mg | ORAL_TABLET | Freq: Four times a day (QID) | ORAL | Status: DC | PRN

## 2014-01-08 MED ORDER — VITAMIN E 45 MG (100 UNIT) PO CAPS
1000.0000 [IU] | ORAL_CAPSULE | Freq: Every day | ORAL | Status: DC
  Administered 2014-01-08 – 2014-01-11 (×3): 1000 [IU] via ORAL
  Filled 2014-01-08 (×4): qty 2

## 2014-01-08 NOTE — Plan of Care (Signed)
Problem: Consults Goal: Tobacco Cessation referral if indicated Outcome: Not Applicable Date Met:  82/57/49 Goal: Nutrition Consult-if indicated Outcome: Not Applicable Date Met:  35/52/17 Goal: Diabetes Guidelines if Diabetic/Glucose > 140 If diabetic or lab glucose is > 140 mg/dl - Initiate Diabetes/Hyperglycemia Guidelines & Document Interventions  Outcome: Not Applicable Date Met:  47/15/95  Problem: Phase I Progression Outcomes Goal: Dyspnea controlled at rest (HF) Outcome: Completed/Met Date Met:  01/08/14 Goal: Up in chair, BRP Outcome: Completed/Met Date Met:  01/08/14 Goal: Initial discharge plan identified Outcome: Completed/Met Date Met:  01/08/14 Goal: Voiding-avoid urinary catheter unless indicated Outcome: Not Met (add Reason) F/C INSERTED D/T AGGRESSIVE DIURESING DAY #1 Goal: Hemodynamically stable Outcome: Completed/Met Date Met:  01/08/14

## 2014-01-08 NOTE — H&P (Signed)
[facility] History and Physical  Julie Haas WRU:[id number] DOB: 03/22/1925 DOA: 01/07/2014  Referring physician: ED physician PCP: Estill Dooms, MD  Specialists:   Chief Complaint: Worsening shortness of breath and mild dry cough  HPI: Julie Haas is a 80 y.o. female with PMH of HTN, diastolic CHF,  A Fib, hyperlipidemia, GERD, depression, who presents with the worsening shortness of breath and mild dry cough.  Patient reports that she has mild shortness of breath at baseline because of congestive heart failure. Since yesterday her shortness of breath has been getting worse. She also has mild dry cough. She states that she uses oxygen at baseline of 3 liters at home. She does not have fever, chills, chest pain. Does not have pain over her calf areas. He has A. Fib, currently is on Elliquis, no bleeding tendency. Reports that she is compliant to her diuretics, including Lasix 80 mg twice a day, and metolazone 2.5 mg daily. Patient does not have nausea, vomiting, abdominal pain, diarrhea. Denies any symptoms of UTI.  Work up in the ED demonstrates leukocytosis with WBC 11.7; temperature 100.2; negative troponin; and proBNP 4510. Patient is also found to have A. fib with RVR. Cardizem drip was started. Patient is admitted to inpatient for further evaluation and treatment.  Review of Systems: As presented in the history of presenting illness, rest negative.  Where does patient live?  At [facility] home Can patient participate in ADLs? none  Allergy:  Allergies  Allergen Reactions  . Codeine     Unknown  . Cucumber Extract Nausea And Vomiting  . Diuretic [Buchu-Cornsilk-Ch Grass-Hydran]     Unknown  . Lipitor [Atorvastatin Calcium]     Unknown  . Penicillins Other (See Comments)    unknown  . Pravachol     Unknown    Past Medical History  Diagnosis Date  . Hyperlipidemia   . Arrhythmia     PVC  . Retinal detachment   . Renal disorder   . Allergy   .  Occlusion and stenosis of carotid artery without mention of cerebral infarction 03/24/2012  . Pneumonia, organism unspecified 11/192013  . Depressive disorder, not elsewhere classified 03/05/2011  . Other premature beats 03/05/2011  . Other seborrheic keratosis 11/20/2010  . Syncope and collapse 10/02/2010  . Palpitations 11/21/2009  . Unspecified essential hypertension 06/27/2009  . Cardiomegaly 06/27/2009  . Other generalized ischemic cerebrovascular disease 06/27/2009  . Dizziness and giddiness 06/27/2009  . Unspecified vitamin D deficiency 05/16/2009  . Family history of sudden cardiac death (SCD) 2008/12/25  . Rash and other nonspecific skin eruption 07/05/2008  . Edema 03/15/2008  . Obesity, unspecified 07/25/2006  . Varicose veins of lower extremities with inflammation 03/16/2003  . Shortness of breath   . Atrial fibrillation, persistent     Past Surgical History  Procedure Laterality Date  . Cholecystectomy  1983  . Cataract extraction Bilateral     Dr. [name]  . Tonsillectomy    . Keratosis    . Retinal detachment surgery Left   . Cardioversion N/A 10/01/2013    Procedure: CARDIOVERSION;  Surgeon: Sanda Klein, MD;  Location: [facility] ENDOSCOPY;  Service: Cardiovascular;  Laterality: N/A;  . Tee without cardioversion N/A 10/01/2013    Procedure: TRANSESOPHAGEAL ECHOCARDIOGRAM (TEE);  Surgeon: Sanda Klein, MD;  Location: [facility] ENDOSCOPY;  Service: Cardiovascular;  Laterality: N/A;  . Tee without cardioversion N/A 11/15/2013    Procedure: TRANSESOPHAGEAL ECHOCARDIOGRAM (TEE);  Surgeon: Fay Records, MD;  Location: [place] [facility];  Service:  Cardiovascular;  Laterality: N/A;    Social History:  reports that she has never smoked. She has never used smokeless tobacco. She reports that she does not drink alcohol or use illicit drugs.  Family History:  Family History  Problem Relation Age of Onset  . Arrhythmia      Long QT in first degree relatives,  . Heart attack Father   . Heart disease  Father   . Heart attack Brother   . Heart disease Brother   . Pneumonia Mother      Prior to Admission medications   Medication Sig Start Date End Date Taking? Authorizing Provider  albuterol (PROVENTIL) (2.5 MG/3ML) 0.083% nebulizer solution Take 2.5 mg by nebulization every 6 (six) hours as needed for wheezing or shortness of breath.   Yes Historical Provider, MD  amiodarone (PACERONE) 200 MG tablet Take 200 mg by mouth daily.   Yes Historical Provider, MD  apixaban (ELIQUIS) 5 MG TABS tablet Take 1 tablet (5 mg total) by mouth 2 (two) times daily. 10/02/13  Yes [name], PA-C  furosemide (LASIX) 80 MG tablet Take 80 mg by mouth 2 (two) times daily.   Yes Historical Provider, MD  guaiFENesin-dextromethorphan (ROBITUSSIN DM) 100-10 MG/5ML syrup Take 10 mLs by mouth every 6 (six) hours as needed for cough.   Yes Historical Provider, MD  ipratropium (ATROVENT) 0.02 % nebulizer solution Inhale 0.2 mg into the lungs every 4 (four) hours as needed for wheezing or shortness of breath.  11/23/13  Yes Historical Provider, MD  levalbuterol (XOPENEX) 0.63 MG/3ML nebulizer solution Take 3 mLs (0.63 mg total) by nebulization every 4 (four) hours as needed for wheezing or shortness of breath. 10/20/13  Yes Geradine Girt, DO  metolazone (ZAROXOLYN) 2.5 MG tablet Take 2.5 mg by mouth daily.   Yes Historical Provider, MD  metoprolol (LOPRESSOR) 50 MG tablet Take 1 tablet (50 mg total) by mouth 2 (two) times daily. Patient taking differently: Take 75 mg by mouth 2 (two) times daily.  11/22/13  Yes Nita Sells, MD  omeprazole (PRILOSEC) 20 MG capsule Take 20 mg by mouth daily.   Yes Historical Provider, MD  potassium chloride SA (KLOR-CON M20) 20 MEQ tablet Take 100 mEq by mouth daily.    Yes Historical Provider, MD  promethazine (PHENERGAN) 12.5 MG tablet Take 12.5 mg by mouth every 6 (six) hours as needed for nausea or vomiting.   Yes Historical Provider, MD  vitamin E 1000 UNIT capsule Take 1,000  Units by mouth daily.   Yes Historical Provider, MD  Multiple Vitamins-Minerals (PRESERVISION AREDS) CAPS Take 1 capsule by mouth 2 (two) times daily.    Historical Provider, MD  sertraline (ZOLOFT) 25 MG tablet Take 0.5 tablets (12.5 mg total) by mouth daily. Patient taking differently: Take 25 mg by mouth daily.  11/22/13   Nita Sells, MD    Physical Exam: Filed Vitals:   01/07/14 2300 01/07/14 2315 01/08/14 0004 01/08/14 0020  BP: 108/78 101/76 104/71   Pulse: 129 136 127 121  Temp:      TempSrc:      Resp: 41 40 19 34  SpO2: 90% 94% 94% 98%   General: Not in acute distress HEENT:       Eyes: PERRL, EOMI, no scleral icterus       ENT: No discharge from the ears and nose, no pharynx injection, no tonsillar enlargement.        Neck: no bruit, no mass felt. Cardiac: S1/S2, RRR, No  murmurs, No gallops or rubs Pulm: Has diffused crackles bilaterally. Abd: Soft, nondistended, nontender, no rebound pain, no organomegaly, BS present Ext: 3+pitting edema bilaterally. 2+DP/PT pulse bilaterally Musculoskeletal: No joint deformities, erythema, or stiffness, ROM full Skin: No rashes.  Neuro: Alert and oriented X3, cranial nerves II-XII grossly intact, muscle strength 5/5 in all extremeties, sensation to light touch intact.  Psych: Patient is not psychotic, no suicidal or hemocidal ideation.  Labs on Admission:  Basic Metabolic Panel:  Recent Labs Lab 01/03/14 01/07/14 2246  NA 138 137  K 3.6 5.0  CL  --  94*  CO2  --  29  GLUCOSE  --  108*  BUN 14 17  CREATININE 0.8 0.78  CALCIUM  --  9.0   Liver Function Tests: No results for input(s): AST, ALT, ALKPHOS, BILITOT, PROT, ALBUMIN in the last 168 hours. No results for input(s): LIPASE, AMYLASE in the last 168 hours. No results for input(s): AMMONIA in the last 168 hours. CBC:  Recent Labs Lab 01/07/14 2246  WBC 11.7*  HGB 10.8*  HCT 34.1*  MCV 91.7  PLT 485*   Cardiac Enzymes: No results for input(s): CKTOTAL,  CKMB, CKMBINDEX, TROPONINI in the last 168 hours.  BNP (last 3 results)  Recent Labs  10/29/13 1619 11/09/13 0658 11/17/13 0322  PROBNP 444.0* 1801.0* 1747.0*   CBG: No results for input(s): GLUCAP in the last 168 hours.  Radiological Exams on Admission: Dg Chest Port 1 View  01/07/2014   CLINICAL DATA:  Shortness of breath and palpitations. Recent hospitalization for new onset atrial fibrillation. Subsequent encounter.  EXAM: PORTABLE CHEST - 1 VIEW  COMPARISON:  11/17/2013 and 11/10/2013 radiographs; CT 10/11/2013.  FINDINGS: 2255 hr. There is stable cardiomegaly and aortic atherosclerosis. Small bilateral pleural effusions are present. There are bilateral airspace opacities which are similar to the most recent examination. These have increased from radiographs obtained 3 months ago. These were evaluated by CT in August and appear progressive since then. Left glenohumeral degenerative changes are present. There are no acute osseous findings. Several telemetry leads overlie the chest.  IMPRESSION: Persistent diffuse bilateral airspace opacities and bilateral pleural effusions, similar to most recent examination of 6 weeks ago, but progressive over the last several months. Although there may be a component of edema, the findings are suspicious for an infectious or inflammatory process. Correlate clinically.   Electronically Signed   By: Camie Patience M.D.   On: 01/07/2014 23:11    EKG: Independently reviewed.   Assessment/Plan Principal Problem:   HCAP (healthcare-associated pneumonia) Active Problems:   HTN (hypertension)   Hyperlipidemia   Varicose veins of lower extremities with inflammation   Mild aortic stenosis with normal LVF   Depression   Anemia of chronic disease   GERD (gastroesophageal reflux disease)   Atrial fibrillation with RVR   DNR (do not resuscitate)   Diastolic congestive heart failure  Shortness of breath: Patient's shortness breath mostly due to combined  HCAP and congestive heart failure exacerbation as evidenced by infiltration on chest x-ray and elevated proBNP. Pulmonary embolism is unlikely given that patient is Elliquis. 2-D echo 10/01/13 showed EF 55-60%.  - will admit to sdu - treat with Vancomycin and Primaxin (patient is allergic to penicillin).  - blood culture - urine legionella and S. pneumococcal antigen - follow up blood culture x 2 - Start IV Lasix 80 mg twice a day - Continue metoprolol - BMP and magnesium level in morning - Troponin 3  -Albuterol and Atrovent  nebulizers   A Fib with RVR:  - cardizem gtt  - continue Elliquis  -Continue amiodarone  HTN, stable cont home regimen   GERD: PPI  Depression: Stable, continue Zoloft  DVT ppx: on Elliquis  Code Status: DNR Family Communication: None at bed side.    Disposition Plan: Admit to inpatient   Date of Service 01/08/2014    Ivor Costa [facility] Pager [phone]  If 7PM-7AM, please contact night-coverage [email or web address] Password [facility] 01/08/2014, 12:26 AM

## 2014-01-08 NOTE — Progress Notes (Signed)
PT Cancellation Note  Patient Details Name: WYATT GALVAN MRN: [id number] DOB: 10/12/1925   Cancelled Treatment:    Reason Eval/Treat Not Completed: Medical issues which prohibited therapy.  Patient currently on bedrest per MD orders.  MD:  Please write activity orders when appropriate for patient.  PT will initiate evaluation at that time.  Thank you!   Despina Pole 01/08/2014, 11:34 AM Carita Pian. Sanjuana Kava, [place] Pager [phone]

## 2014-01-08 NOTE — Progress Notes (Signed)
ANTIBIOTIC CONSULT NOTE - INITIAL  Pharmacy Consult for Vancocin and Primaxin Indication: rule out pneumonia  Allergies  Allergen Reactions  . Codeine     Unknown  . Cucumber Extract Nausea And Vomiting  . Diuretic [Buchu-Cornsilk-Ch Grass-Hydran]     Unknown  . Lipitor [Atorvastatin Calcium]     Unknown  . Penicillins Other (See Comments)    unknown  . Pravachol     Unknown    Patient Measurements: Height: 5\' 4"  (162.6 cm) Weight: 173 lb 4.5 oz (78.6 kg) IBW/kg (Calculated) : 54.7  Vital Signs: Temp: 100.2 F (37.9 C) (11/20 2242) Temp Source: Oral (11/20 2242) BP: 104/71 mmHg (11/21 0004) Pulse Rate: 121 (11/21 0020)  Labs:  Recent Labs  01/07/14 2246  WBC 11.7*  HGB 10.8*  PLT 485*  CREATININE 0.78   Estimated Creatinine Clearance: 51.2 mL/min (by C-G formula based on Cr of 0.78).  Medical History: Past Medical History  Diagnosis Date  . Hyperlipidemia   . Arrhythmia     PVC  . Retinal detachment   . Renal disorder   . Allergy   . Occlusion and stenosis of carotid artery without mention of cerebral infarction 03/24/2012  . Pneumonia, organism unspecified 11/192013  . Depressive disorder, not elsewhere classified 03/05/2011  . Other premature beats 03/05/2011  . Other seborrheic keratosis 11/20/2010  . Syncope and collapse 10/02/2010  . Palpitations 11/21/2009  . Unspecified essential hypertension 06/27/2009  . Cardiomegaly 06/27/2009  . Other generalized ischemic cerebrovascular disease 06/27/2009  . Dizziness and giddiness 06/27/2009  . Unspecified vitamin D deficiency 05/16/2009  . Family history of sudden cardiac death (SCD) 2008/12/17  . Rash and other nonspecific skin eruption 07/05/2008  . Edema 03/15/2008  . Obesity, unspecified 07/25/2006  . Varicose veins of lower extremities with inflammation 03/16/2003  . Shortness of breath   . Atrial fibrillation, persistent      Assessment: 80yo female NH resident was brought to PCP for SOB and tachycardia,  was found to be in Afib w/ RVR after previously cardioverting, initially pt refused to go to hospital, now being admitted, CXR similar to one from months ago but could represent infectious process, to begin IV ABX.  Goal of Therapy:  Vancomycin trough level 15-20 mcg/ml  Plan:  Rec'd Levaquin 750mg  in ED and vanc 1g has been ordered to give; will continue with vancomycin 1000mg  IV Q12H (based on recent trough information) as well as Primaxin 500mg  IV Q8H and monitor CBC, Cx, levels prn.  Wynona Neat, PharmD, BCPS  01/08/2014,1:37 AM

## 2014-01-08 NOTE — Progress Notes (Signed)
Patient B/P 95/55, asymptomatic, NP K.Kirby paged awaiting orders. Will continue to monitor patient.

## 2014-01-08 NOTE — Progress Notes (Addendum)
Patient admitted after midnight.  Please see H&P.  Cardiology consult- ? Options -HR low 100s -wean off bipap  Shortness of breath: Patient's shortness breath mostly due to combined HCAP and congestive heart failure exacerbation as evidenced by infiltration on chest x-ray and elevated proBNP. Pulmonary embolism is unlikely given that patient is Elliquis. 2-D echo 10/01/13 showed EF 55-60%.  - will admit to sdu - treat with Vancomycin and Primaxin (patient is allergic to penicillin).  - blood culture - urine legionella and S. pneumococcal antigen - follow up blood culture x 2 - IV Lasix 80 mg twice a day- not given this AM as BP low - Continue metoprolol - Troponin 3 -Albuterol and Atrovent nebulizers  A Fib with RVR:  - cardizem gtt- not able to tolerate due to BP low - continue Elliquis -Continue amiodarone  HTN, stable cont home regimen   Spoke with son  Eulogio Bear DO

## 2014-01-08 NOTE — Progress Notes (Signed)
**Note De-Identified Julie Haas Obfuscation** Patient removed from BIPAP and placed on 5L [place], RT to cont. To monitor

## 2014-01-08 NOTE — Consult Note (Signed)
Primary cardiologist: [place]  HPI: 80 year old female with past medical history of atrial fibrillation, diastolic congestive heart failure admitted with possible pneumonia with superimposed congestive heart failure for evaluation of atrial fibrillation. Last echocardiogram was performed in August 2015. This revealed normal LV function, mild aortic stenosis with a mean gradient of 14 mmHg, moderate LAE and mild MR. Patient has a history of successful TEE cardioversion in August 2015 but did not hold sinus rhythm. She was seen in the office for worsening congestive heart failure and amiodarone was added with the ultimate plan to proceed with cardioversion. Repeat TEE on 11/15/2013 per Dr. Harrington Challenger showed left atrial appendage thrombus. Ultimately plan was to proceed with cardioversion after anticoagulation. She was seen in the office yesterday by Dr. Martinique after heart rate was noted to be elevated. She refused hospitalization at that time and declined cardioversion. Her amiodarone was increased back to 400 mg daily and her metoprolol was increased. During the office visit patient apparently stated she was ready to die. She was admitted early this morning with worsening dyspnea. She has a cough that she states is nonproductive. No hemoptysis. No fevers or chills. She does describe pedal edema. She has had 3 brief sharp chest pains for 1 second but no other chest pain. She does feel palpitations.  Medications Prior to Admission  Medication Sig Dispense Refill  . albuterol (PROVENTIL) (2.5 MG/3ML) 0.083% nebulizer solution Take 2.5 mg by nebulization every 6 (six) hours as needed for wheezing or shortness of breath.    Marland Kitchen amiodarone (PACERONE) 200 MG tablet Take 200 mg by mouth daily.    Marland Kitchen apixaban (ELIQUIS) 5 MG TABS tablet Take 1 tablet (5 mg total) by mouth 2 (two) times daily. 60 tablet 11  . furosemide (LASIX) 80 MG tablet Take 80 mg by mouth 2 (two) times daily.    Marland Kitchen guaiFENesin-dextromethorphan  (ROBITUSSIN DM) 100-10 MG/5ML syrup Take 10 mLs by mouth every 6 (six) hours as needed for cough.    Marland Kitchen ipratropium (ATROVENT) 0.02 % nebulizer solution Inhale 0.2 mg into the lungs every 4 (four) hours as needed for wheezing or shortness of breath.   0  . levalbuterol (XOPENEX) 0.63 MG/3ML nebulizer solution Take 3 mLs (0.63 mg total) by nebulization every 4 (four) hours as needed for wheezing or shortness of breath. 3 mL 12  . metolazone (ZAROXOLYN) 2.5 MG tablet Take 2.5 mg by mouth daily.    . metoprolol (LOPRESSOR) 50 MG tablet Take 1 tablet (50 mg total) by mouth 2 (two) times daily. (Patient taking differently: Take 75 mg by mouth 2 (two) times daily. ) 60 tablet 0  . omeprazole (PRILOSEC) 20 MG capsule Take 20 mg by mouth daily.    . potassium chloride SA (KLOR-CON M20) 20 MEQ tablet Take 100 mEq by mouth daily.     . promethazine (PHENERGAN) 12.5 MG tablet Take 12.5 mg by mouth every 6 (six) hours as needed for nausea or vomiting.    . vitamin E 1000 UNIT capsule Take 1,000 Units by mouth daily.    . Multiple Vitamins-Minerals (PRESERVISION AREDS) CAPS Take 1 capsule by mouth 2 (two) times daily.    . sertraline (ZOLOFT) 25 MG tablet Take 0.5 tablets (12.5 mg total) by mouth daily. (Patient taking differently: Take 25 mg by mouth daily. ) 30 tablet 0    Allergies  Allergen Reactions  . Codeine     Unknown  . Cucumber Extract Nausea And Vomiting  . Diuretic [Buchu-Cornsilk-Ch Grass-Hydran]  Unknown  . Lipitor [Atorvastatin Calcium]     Unknown  . Penicillins Other (See Comments)    unknown  . Pravachol     Unknown    Past Medical History  Diagnosis Date  . Hyperlipidemia   . Arrhythmia     PVC  . Retinal detachment   . Renal disorder   . Allergy   . Occlusion and stenosis of carotid artery without mention of cerebral infarction 03/24/2012  . Pneumonia, organism unspecified 11/192013  . Depressive disorder, not elsewhere classified 03/05/2011  . Other premature beats  03/05/2011  . Other seborrheic keratosis 11/20/2010  . Syncope and collapse 10/02/2010  . Palpitations 11/21/2009  . Unspecified essential hypertension 06/27/2009  . Cardiomegaly 06/27/2009  . Other generalized ischemic cerebrovascular disease 06/27/2009  . Dizziness and giddiness 06/27/2009  . Unspecified vitamin D deficiency 05/16/2009  . Family history of sudden cardiac death (SCD) December 29, 2008  . Rash and other nonspecific skin eruption 07/05/2008  . Edema 03/15/2008  . Obesity, unspecified 07/25/2006  . Varicose veins of lower extremities with inflammation 03/16/2003  . Shortness of breath   . Atrial fibrillation, persistent     Past Surgical History  Procedure Laterality Date  . Cholecystectomy  1983  . Cataract extraction Bilateral     Dr. [name]  . Tonsillectomy    . Keratosis    . Retinal detachment surgery Left   . Cardioversion N/A 10/01/2013    Procedure: CARDIOVERSION;  Surgeon: Sanda Klein, MD;  Location: [facility] ENDOSCOPY;  Service: Cardiovascular;  Laterality: N/A;  . Tee without cardioversion N/A 10/01/2013    Procedure: TRANSESOPHAGEAL ECHOCARDIOGRAM (TEE);  Surgeon: Sanda Klein, MD;  Location: [facility] ENDOSCOPY;  Service: Cardiovascular;  Laterality: N/A;  . Tee without cardioversion N/A 11/15/2013    Procedure: TRANSESOPHAGEAL ECHOCARDIOGRAM (TEE);  Surgeon: Fay Records, MD;  Location: [facility] ENDOSCOPY;  Service: Cardiovascular;  Laterality: N/A;    History   Social History  . Marital Status: Widowed    Spouse Name: N/A    Number of Children: N/A  . Years of Education: N/A   Occupational History  . Housewife    Social History Main Topics  . Smoking status: Never Smoker   . Smokeless tobacco: Never Used  . Alcohol Use: No  . Drug Use: No  . Sexual Activity: No   Other Topics Concern  . Not on file   Social History Narrative   The patient is widowed, husband died 25-Mar-2011.  She has two children and two     grandchildren.     She never smoked cigarettes    Does not  drink alcohol.    Drinks 2 cups caffeine coffee,    Lives at [facility] since 09/03/2004   Living Will   Exercise: walks daily          Family History  Problem Relation Age of Onset  . Arrhythmia      Long QT in first degree relatives,  . Heart attack Father   . Heart disease Father   . Heart attack Brother   . Heart disease Brother   . Pneumonia Mother     ROS:  Nonproductive cough but no fevers or chills, hemoptysis, dysphasia, odynophagia, melena, hematochezia, dysuria, hematuria, rash, seizure activity, claudication. Remaining systems are negative.  Physical Exam:   Blood pressure 90/57, pulse 98, temperature 98.3 F (36.8 C), temperature source Oral, resp. rate 18, height 5' 4"  (1.626 m), weight 190 lb 8 oz (86.41 kg), SpO2 94 %.  General:  Well developed/obese in NAD Skin warm/dry, ecchymoses noted Patient not depressed No peripheral clubbing Back-normal HEENT-normal/normal eyelids Neck supple/normal carotid upstroke bilaterally; no bruits; no JVD; no thyromegaly chest - Diminished BS throughout CV - irregular and tachycardic/normal S1 and S2; no rubs or gallops;  PMI nondisplaced, 2/6 systolic murmur LSB Abdomen -NT/ND, no HSM, no mass, + bowel sounds, no bruit 2+ femoral pulses, no bruits Ext-1-2+ edema, no chords, diminished DP Neuro-grossly nonfocal  ECG Atrial fibrillation with a rapid ventricular response, occasional PVC, IVCD, nonspecific ST changes, prolonged QT.  Results for orders placed or performed during the hospital encounter of 01/07/14 (from the past 48 hour(s))  Basic metabolic panel     Status: Abnormal   Collection Time: 01/07/14 10:46 PM  Result Value Ref Range   Sodium 137 137 - 147 mEq/L   Potassium 5.0 3.7 - 5.3 mEq/L   Chloride 94 (L) 96 - 112 mEq/L   CO2 29 19 - 32 mEq/L   Glucose, Bld 108 (H) 70 - 99 mg/dL   BUN 17 6 - 23 mg/dL   Creatinine, Ser 0.78 0.50 - 1.10 mg/dL   Calcium 9.0 8.4 - 10.5 mg/dL   GFR calc non Af Amer 74  (L) >90 mL/min   GFR calc Af Amer 85 (L) >90 mL/min    Comment: (NOTE) The eGFR has been calculated using the CKD EPI equation. This calculation has not been validated in all clinical situations. eGFR's persistently <90 mL/min signify possible Chronic Kidney Disease.    Anion gap 14 5 - 15  CBC     Status: Abnormal   Collection Time: 01/07/14 10:46 PM  Result Value Ref Range   WBC 11.7 (H) 4.0 - 10.5 K/uL   RBC 3.72 (L) 3.87 - 5.11 MIL/uL   Hemoglobin 10.8 (L) 12.0 - 15.0 g/dL   HCT 34.1 (L) 36.0 - 46.0 %   MCV 91.7 78.0 - 100.0 fL   MCH 29.0 26.0 - 34.0 pg   MCHC 31.7 30.0 - 36.0 g/dL   RDW 16.4 (H) 11.5 - 15.5 %   Platelets 485 (H) 150 - 400 K/uL  Protime-INR - (only if patient is taking Coumadin)     Status: Abnormal   Collection Time: 01/07/14 10:46 PM  Result Value Ref Range   Prothrombin Time 18.4 (H) 11.6 - 15.2 seconds   INR 1.52 (H) 0.00 - 1.49  Pro b natriuretic peptide (BNP) - ONLY if shortness of breath has been DOCUMENTED in THIS visit     Status: Abnormal   Collection Time: 01/07/14 10:50 PM  Result Value Ref Range   Pro B Natriuretic peptide (BNP) 4510.0 (H) 0 - 450 pg/mL  I-stat troponin, ED (do not order at [facility])     Status: None   Collection Time: 01/07/14 10:56 PM  Result Value Ref Range   Troponin i, poc 0.01 0.00 - 0.08 ng/mL   Comment 3            Comment: Due to the release kinetics of cTnI, a negative result within the first hours of the onset of symptoms does not rule out myocardial infarction with certainty. If myocardial infarction is still suspected, repeat the test at appropriate intervals.   Hepatic function panel     Status: Abnormal   Collection Time: 01/07/14 11:30 PM  Result Value Ref Range   Total Protein 7.6 6.0 - 8.3 g/dL   Albumin 2.6 (L) 3.5 - 5.2 g/dL   AST 44 (H) 0 -  37 U/L   ALT 41 (H) 0 - 35 U/L   Alkaline Phosphatase 91 39 - 117 U/L   Total Bilirubin 0.5 0.3 - 1.2 mg/dL   Bilirubin, Direct <0.2 0.0 - 0.3 mg/dL   Indirect  Bilirubin NOT CALCULATED 0.3 - 0.9 mg/dL  Urinalysis, Routine w reflex microscopic     Status: Abnormal   Collection Time: 01/08/14 12:02 AM  Result Value Ref Range   Color, Urine YELLOW YELLOW   APPearance CLEAR CLEAR   Specific Gravity, Urine 1.009 1.005 - 1.030   pH 6.5 5.0 - 8.0   Glucose, UA NEGATIVE NEGATIVE mg/dL   Hgb urine dipstick NEGATIVE NEGATIVE   Bilirubin Urine NEGATIVE NEGATIVE   Ketones, ur NEGATIVE NEGATIVE mg/dL   Protein, ur NEGATIVE NEGATIVE mg/dL   Urobilinogen, UA 1.0 0.0 - 1.0 mg/dL   Nitrite NEGATIVE NEGATIVE   Leukocytes, UA TRACE (A) NEGATIVE  Urine microscopic-add on     Status: Abnormal   Collection Time: 01/08/14 12:02 AM  Result Value Ref Range   Squamous Epithelial / LPF RARE RARE   WBC, UA 3-6 <3 WBC/hpf   RBC / HPF 0-2 <3 RBC/hpf   Bacteria, UA RARE RARE   Casts HYALINE CASTS (A) NEGATIVE  Strep pneumoniae urinary antigen     Status: None   Collection Time: 01/08/14  4:01 AM  Result Value Ref Range   Strep Pneumo Urinary Antigen NEGATIVE NEGATIVE    Comment:        Infection due to S. pneumoniae cannot be absolutely ruled out since the antigen present may be below the detection limit of the test.   Troponin I (q 6hr x 3)     Status: None   Collection Time: 01/08/14  4:20 AM  Result Value Ref Range   Troponin I <0.30 <0.30 ng/mL    Comment:        Due to the release kinetics of cTnI, a negative result within the first hours of the onset of symptoms does not rule out myocardial infarction with certainty. If myocardial infarction is still suspected, repeat the test at appropriate intervals.   Comprehensive metabolic panel     Status: Abnormal   Collection Time: 01/08/14  4:20 AM  Result Value Ref Range   Sodium 135 (L) 137 - 147 mEq/L   Potassium 4.5 3.7 - 5.3 mEq/L   Chloride 92 (L) 96 - 112 mEq/L   CO2 24 19 - 32 mEq/L   Glucose, Bld 113 (H) 70 - 99 mg/dL   BUN 16 6 - 23 mg/dL   Creatinine, Ser 0.74 0.50 - 1.10 mg/dL   Calcium  8.5 8.4 - 10.5 mg/dL   Total Protein 7.0 6.0 - 8.3 g/dL   Albumin 2.2 (L) 3.5 - 5.2 g/dL   AST 43 (H) 0 - 37 U/L   ALT 36 (H) 0 - 35 U/L   Alkaline Phosphatase 78 39 - 117 U/L   Total Bilirubin 0.5 0.3 - 1.2 mg/dL   GFR calc non Af Amer 75 (L) >90 mL/min   GFR calc Af Amer 87 (L) >90 mL/min    Comment: (NOTE) The eGFR has been calculated using the CKD EPI equation. This calculation has not been validated in all clinical situations. eGFR's persistently <90 mL/min signify possible Chronic Kidney Disease.    Anion gap 19 (H) 5 - 15  CBC WITH DIFFERENTIAL     Status: Abnormal   Collection Time: 01/08/14  4:20 AM  Result Value Ref Range  WBC 9.9 4.0 - 10.5 K/uL   RBC 3.66 (L) 3.87 - 5.11 MIL/uL   Hemoglobin 10.4 (L) 12.0 - 15.0 g/dL   HCT 33.7 (L) 36.0 - 46.0 %   MCV 92.1 78.0 - 100.0 fL   MCH 28.4 26.0 - 34.0 pg   MCHC 30.9 30.0 - 36.0 g/dL   RDW 16.5 (H) 11.5 - 15.5 %   Platelets 381 150 - 400 K/uL   Neutrophils Relative % 77 43 - 77 %   Neutro Abs 7.7 1.7 - 7.7 K/uL   Lymphocytes Relative 11 (L) 12 - 46 %   Lymphs Abs 1.1 0.7 - 4.0 K/uL   Monocytes Relative 11 3 - 12 %   Monocytes Absolute 1.0 0.1 - 1.0 K/uL   Eosinophils Relative 1 0 - 5 %   Eosinophils Absolute 0.1 0.0 - 0.7 K/uL   Basophils Relative 0 0 - 1 %   Basophils Absolute 0.0 0.0 - 0.1 K/uL  MRSA PCR Screening     Status: None   Collection Time: 01/08/14  6:46 AM  Result Value Ref Range   MRSA by PCR NEGATIVE NEGATIVE    Comment:        The GeneXpert MRSA Assay (FDA approved for NASAL specimens only), is one component of a comprehensive MRSA colonization surveillance program. It is not intended to diagnose MRSA infection nor to guide or monitor treatment for MRSA infections.   Troponin I (q 6hr x 3)     Status: None   Collection Time: 01/08/14 10:34 AM  Result Value Ref Range   Troponin I <0.30 <0.30 ng/mL    Comment:        Due to the release kinetics of cTnI, a negative result within the first  hours of the onset of symptoms does not rule out myocardial infarction with certainty. If myocardial infarction is still suspected, repeat the test at appropriate intervals.     Dg Chest Port 1 View  01/07/2014   CLINICAL DATA:  Shortness of breath and palpitations. Recent hospitalization for new onset atrial fibrillation. Subsequent encounter.  EXAM: PORTABLE CHEST - 1 VIEW  COMPARISON:  11/17/2013 and 11/10/2013 radiographs; CT 10/11/2013.  FINDINGS: 2255 hr. There is stable cardiomegaly and aortic atherosclerosis. Small bilateral pleural effusions are present. There are bilateral airspace opacities which are similar to the most recent examination. These have increased from radiographs obtained 3 months ago. These were evaluated by CT in August and appear progressive since then. Left glenohumeral degenerative changes are present. There are no acute osseous findings. Several telemetry leads overlie the chest.  IMPRESSION: Persistent diffuse bilateral airspace opacities and bilateral pleural effusions, similar to most recent examination of 6 weeks ago, but progressive over the last several months. Although there may be a component of edema, the findings are suspicious for an infectious or inflammatory process. Correlate clinically.   Electronically Signed   By: Camie Patience M.D.   On: 01/07/2014 23:11    Assessment/Plan 1 atrial fibrillation-the patient remains in atrial fibrillation with elevated heart rate. Continue Cardizem and metoprolol. Increase amiodarone to 400 mg daily. Continue apixaban. Her atrial fibrillation is certainly contributing to her congestive heart failure. She is now agreeable to cardioversion. Given previous TEE demonstrating left atrial appendage thrombus we will plan TEE guided cardioversion on Monday. 2 acute on chronic diastolic congestive heart failure-the patient is volume overloaded on examination. Will treat with Lasix 80 mg IV twice a day and follow renal function. 3  aortic stenosis-mild on  previous transthoracic echocardiogram and transesophageal echocardiogram in August suggested moderate aortic stenosis. This is most likely contributing to congestive heart failure. 4 pneumonia-antibiotics per primary care. [place] MD 01/08/2014, 1:32 PM

## 2014-01-09 DIAGNOSIS — J189 Pneumonia, unspecified organism: Secondary | ICD-10-CM

## 2014-01-09 DIAGNOSIS — Z515 Encounter for palliative care: Secondary | ICD-10-CM

## 2014-01-09 DIAGNOSIS — Z789 Other specified health status: Secondary | ICD-10-CM | POA: Diagnosis not present

## 2014-01-09 LAB — CBC
HEMATOCRIT: 30.7 % — AB (ref 36.0–46.0)
HEMOGLOBIN: 9.7 g/dL — AB (ref 12.0–15.0)
MCH: 29 pg (ref 26.0–34.0)
MCHC: 31.6 g/dL (ref 30.0–36.0)
MCV: 91.9 fL (ref 78.0–100.0)
Platelets: 381 10*3/uL (ref 150–400)
RBC: 3.34 MIL/uL — ABNORMAL LOW (ref 3.87–5.11)
RDW: 16.1 % — AB (ref 11.5–15.5)
WBC: 9.5 10*3/uL (ref 4.0–10.5)

## 2014-01-09 LAB — URINE CULTURE
Colony Count: NO GROWTH
Culture: NO GROWTH

## 2014-01-09 LAB — BLOOD GAS, ARTERIAL
Acid-Base Excess: 11.4 mmol/L — ABNORMAL HIGH (ref 0.0–2.0)
BICARBONATE: 36.1 meq/L — AB (ref 20.0–24.0)
Drawn by: 28098
O2 Content: 5 L/min
O2 Saturation: 97 %
PATIENT TEMPERATURE: 98.6
PO2 ART: 79 mmHg — AB (ref 80.0–100.0)
TCO2: 37.7 mmol/L (ref 0–100)
pCO2 arterial: 53.4 mmHg — ABNORMAL HIGH (ref 35.0–45.0)
pH, Arterial: 7.445 (ref 7.350–7.450)

## 2014-01-09 LAB — BASIC METABOLIC PANEL
Anion gap: 11 (ref 5–15)
BUN: 14 mg/dL (ref 6–23)
CO2: 31 mEq/L (ref 19–32)
CREATININE: 0.66 mg/dL (ref 0.50–1.10)
Calcium: 8.4 mg/dL (ref 8.4–10.5)
Chloride: 93 mEq/L — ABNORMAL LOW (ref 96–112)
GFR calc non Af Amer: 78 mL/min — ABNORMAL LOW (ref >90)
GLUCOSE: 97 mg/dL (ref 70–99)
POTASSIUM: 3.8 meq/L (ref 3.7–5.3)
Sodium: 135 mEq/L — ABNORMAL LOW (ref 137–147)

## 2014-01-09 MED ORDER — MORPHINE SULFATE (CONCENTRATE) 10 MG/0.5ML PO SOLN
10.0000 mg | ORAL | Status: DC | PRN
Start: 2014-01-09 — End: 2014-01-11
  Administered 2014-01-10: 10 mg via ORAL
  Filled 2014-01-09: qty 0.5

## 2014-01-09 MED ORDER — SERTRALINE HCL 50 MG PO TABS
25.0000 mg | ORAL_TABLET | Freq: Every day | ORAL | Status: DC
  Administered 2014-01-10: 25 mg via ORAL
  Filled 2014-01-09: qty 1

## 2014-01-09 MED ORDER — GUAIFENESIN 100 MG/5ML PO SYRP
200.0000 mg | ORAL_SOLUTION | ORAL | Status: DC | PRN
Start: 2014-01-09 — End: 2014-01-11
  Filled 2014-01-09: qty 10

## 2014-01-09 NOTE — Plan of Care (Signed)
Problem: Phase I Progression Outcomes Goal: Pain controlled with appropriate interventions Outcome: Completed/Met Date Met:  01/09/14 Goal: EF % per last Echo/documented,Core Reminder form on chart Outcome: Completed/Met Date Met:  01/09/14     

## 2014-01-09 NOTE — Progress Notes (Signed)
Subjective:  Feels poorly today.  Complains of severe weakness and mild shortness of breath.  Mildly febrile yesterday.  Atrial fibrillation rate is somewhat better controlled today.  She tells me what's the use about having another cardioversion today.  Objective:  Vital Signs in the last 24 hours: BP 99/66 mmHg  Pulse 102  Temp(Src) 98.5 F (36.9 C) (Oral)  Resp 20  Ht 5\' 4"  (1.626 m)  Wt 78.699 kg (173 lb 8 oz)  BMI 29.77 kg/m2  SpO2 95%  Physical Exam: Elderly white female sitting up at bedside with eyes closed, complaining of severe fatigue, Lungs:  Adderall crackles  Cardiac:  Irregular rhythm, normal S1 and S2, no S3, 2/6 systolic murmur Extremities: 2+ edema present  Intake/Output from previous day: 11/21 0701 - 11/22 0700 In: 1066 [P.O.:660; I.V.:6; IV Piggyback:400] Out: 1000 [Urine:1000]  Weight Filed Weights   01/08/14 0100 01/08/14 0258 01/09/14 0439  Weight: 78.6 kg (173 lb 4.5 oz) 86.41 kg (190 lb 8 oz) 78.699 kg (173 lb 8 oz)    Lab Results: Basic Metabolic Panel:  Recent Labs  01/07/14 2246 01/08/14 0420  NA 137 135*  K 5.0 4.5  CL 94* 92*  CO2 29 24  GLUCOSE 108* 113*  BUN 17 16  CREATININE 0.78 0.74   CBC:  Recent Labs  01/07/14 2246 01/08/14 0420  WBC 11.7* 9.9  NEUTROABS  --  7.7  HGB 10.8* 10.4*  HCT 34.1* 33.7*  MCV 91.7 92.1  PLT 485* 381   Cardiac Enzymes:  Recent Labs  01/08/14 0420 01/08/14 1034 01/08/14 1442  TROPONINI <0.30 <0.30 <0.30    Telemetry: Atrial fibrillation rate is somewhat slower but elevated at times  Assessment/Plan:   1.  Persistent atrial fibrillation which is symptomatic 2.  Acute on chronic diastolic heart failure. 3.  Aortic stenosis. 4.  Pneumonia. 5.  End-of-life issues.  Recommendations:  The patient tells me today that she doesn't want to have a cardioversion done tomorrow.  I will keep her nothing by mouth's after midnight.  At her age and since she has been anticoagulated since  August, it may be best to just do a cardioversion without a TEE as this would be acceptable, according to guidelines.  If she is agreeable and wanted to have this done tomorrow.  Keep her nothing by mouth after midnight.  Continue diuresis.  She has a no CODE BLUE.  Kerry Hough  MD [facility]  01/09/2014, 9:56 AM

## 2014-01-09 NOTE — Progress Notes (Signed)
PT Cancellation Note  Patient Details Name: Julie Haas MRN: [id number] DOB: [birth year]   Cancelled Treatment:    Reason Eval/Treat Not Completed: Medical issues which prohibited therapy.  Spoke with RN and MD - hold PT today due to increased HR and BP issues.  Patient for cardioversion on Monday (tomorrow).  Will return Tuesday, 11/24 for PT evaluation.   Despina Pole 01/09/2014, 11:13 AM Carita Pian. Sanjuana Kava, [place] Pager [phone]

## 2014-01-09 NOTE — Consult Note (Signed)
Palliative Medicine Team at [facility]  Date: 01/09/2014   Patient Name: Julie Haas  DOB: Dec 05, 1925  MRN: [id number]  Age / Sex: 80 y.o., female   PCP: Estill Dooms, MD Referring Physician: Geradine Girt, DO  Active Problems: Principal Problem:   HCAP (healthcare-associated pneumonia) Active Problems:   POLST (Physician Orders for Life-Sustaining Treatment)   HTN (hypertension)   Hyperlipidemia   Varicose veins of lower extremities with inflammation   Mild aortic stenosis with normal LVF   Depression   Anemia of chronic disease   GERD (gastroesophageal reflux disease)   Atrial fibrillation with RVR   DNR (do not resuscitate)   Acute exacerbation of congestive heart failure   HPI/Reason for Consultation: [name] is a 80 y.o. female with 4th admission ins 6 months for CHF exacerbation. PMT consulted for goals of care- [name] has stated that she would like to not [facility] and desires for her family, providers and caregivers to accepts and respect her wishes to allow for a natural death to occur with out addition aggressive or extensive medical interventions.   Participants in Discussion: No HCPOA, two children. Patient herself has FULL CAPACITY.   Advance Directive: MOST form completed this admission   Code Status Orders        Start     Ordered   01/08/14 0103  Do not attempt resuscitation (DNR)   Continuous    Question Answer Comment  In the event of cardiac or respiratory ARREST Do not call a "code blue"   In the event of cardiac or respiratory ARREST Do not perform Intubation, CPR, defibrillation or ACLS   In the event of cardiac or respiratory ARREST Use medication by any route, position, wound care, and other measures to relive pain and suffering. May use oxygen, suction and manual treatment of airway obstruction as needed for comfort.      01/08/14 0102      I have reviewed the medical record,  interviewed the patient and family, and examined the patient. The following aspects are pertinent.  Past Medical History  Diagnosis Date  . Hyperlipidemia   . Arrhythmia     PVC  . Retinal detachment   . Renal disorder   . Allergy   . Occlusion and stenosis of carotid artery without mention of cerebral infarction 03/24/2012  . Pneumonia, organism unspecified 11/192013  . Depressive disorder, not elsewhere classified 03/05/2011  . Other premature beats 03/05/2011  . Other seborrheic keratosis 11/20/2010  . Syncope and collapse 10/02/2010  . Palpitations 11/21/2009  . Unspecified essential hypertension 06/27/2009  . Cardiomegaly 06/27/2009  . Other generalized ischemic cerebrovascular disease 06/27/2009  . Dizziness and giddiness 06/27/2009  . Unspecified vitamin D deficiency 05/16/2009  . Family history of sudden cardiac death (SCD) 12-04-08  . Rash and other nonspecific skin eruption 07/05/2008  . Edema 03/15/2008  . Obesity, unspecified 07/25/2006  . Varicose veins of lower extremities with inflammation 03/16/2003  . Shortness of breath   . Atrial fibrillation, persistent    History   Social History  . Marital Status: Widowed    Spouse Name: N/A    Number of Children: N/A  . Years of Education: N/A   Occupational History  . Housewife    Social History Main Topics  . Smoking status: Never Smoker   . Smokeless tobacco: Never Used  . Alcohol Use: No  . Drug Use: No  . Sexual Activity: No  Other Topics Concern  . None   Social History Narrative   The patient is widowed, husband died 2011-03-08.  She has two children and two     grandchildren.     She never smoked cigarettes    Does not drink alcohol.    Drinks 2 cups caffeine coffee,    Lives at [facility] since 09/03/2004   Living Will   Exercise: walks daily         Family History  Problem Relation Age of Onset  . Arrhythmia      Long QT in first degree relatives,  . Heart attack Father   . Heart disease Father    . Heart attack Brother   . Heart disease Brother   . Pneumonia Mother    Scheduled Meds: . amiodarone  400 mg Oral Daily  . apixaban  5 mg Oral BID  . furosemide  80 mg Intravenous BID  . imipenem-cilastatin  500 mg Intravenous 3 times per day  . metoprolol  50 mg Oral BID  . multivitamin-lutein  1 capsule Oral BID  . pantoprazole  40 mg Oral Daily  . [START ON 01/10/2014] sertraline  25 mg Oral Daily  . sodium chloride  3 mL Intravenous Q12H  . vancomycin  1,000 mg Intravenous Q12H  . vitamin E  1,000 Units Oral Daily   Continuous Infusions: . sodium chloride     PRN Meds:.sodium chloride, albuterol, guaifenesin, ipratropium, levalbuterol, morphine CONCENTRATE, promethazine, sodium chloride Allergies  Allergen Reactions  . Codeine     Unknown  . Cucumber Extract Nausea And Vomiting  . Diuretic [Buchu-Cornsilk-Ch Grass-Hydran]     Unknown  . Lipitor [Atorvastatin Calcium]     Unknown  . Penicillins Other (See Comments)    unknown  . Pravachol     Unknown   CBC:    Component Value Date/Time   WBC 9.5 01/09/2014 1054   WBC 9.1 11/30/2013   WBC 5.6 01/18/2013 1546   HGB 9.7* 01/09/2014 1054   HGB 12.9 01/18/2013 1546   HCT 30.7* 01/09/2014 1054   HCT 41.7 01/18/2013 1546   PLT 381 01/09/2014 1054   MCV 91.9 01/09/2014 1054   MCV 100.3* 01/18/2013 1546   NEUTROABS 7.7 01/08/2014 0420   LYMPHSABS 1.1 01/08/2014 0420   MONOABS 1.0 01/08/2014 0420   EOSABS 0.1 01/08/2014 0420   BASOSABS 0.0 01/08/2014 0420   Comprehensive Metabolic Panel:    Component Value Date/Time   NA 135* 01/09/2014 1054   NA 138 01/03/2014   K 3.8 01/09/2014 1054   CL 93* 01/09/2014 1054   CO2 31 01/09/2014 1054   BUN 14 01/09/2014 1054   BUN 14 01/03/2014   CREATININE 0.66 01/09/2014 1054   CREATININE 0.8 01/03/2014   CREATININE 0.71 07/20/2011 1510   GLUCOSE 97 01/09/2014 1054   CALCIUM 8.4 01/09/2014 1054   AST 43* 01/08/2014 0420   ALT 36* 01/08/2014 0420   ALKPHOS 78  01/08/2014 0420   BILITOT 0.5 01/08/2014 0420   PROT 7.0 01/08/2014 0420   ALBUMIN 2.2* 01/08/2014 0420    Vital Signs: BP 105/62 mmHg  Pulse 103  Temp(Src) 98.2 F (36.8 C) (Oral)  Resp 21  Ht _0  (1.626 m)  Wt 78.699 kg (173 lb 8 oz)  BMI 29.77 kg/m2  SpO2 92% Filed Weights   01/08/14 0100 01/08/14 0258 01/09/14 0439  Weight: 78.6 kg (173 lb 4.5 oz) 86.41 kg (190 lb 8 oz) 78.699 kg (173 lb 8 oz)  11/21 0701 - 11/22 0700 In: 1066 [P.O.:660; I.V.:6; IV Piggyback:400] Out: 1000 [Urine:1000]  Physical Exam:  AOX3 cooperative, chronically ill appearing. +rhonchi Very pale +edema IRIR   Summary of Established Goals of Care and Medical Treatment Preferences  Patient is very clear on the following:  No cardioversion now or in the future  No re-hospitalization  If she declines she wants comfort care only  She does not want her life prolonged in any circumstance  She wants to be discharged in the AM to [facility] and desires full hospice care.   Primary Diagnoses  1. Congestive Heart Failure 2. Refractory A. Fib with RVR 3. HCAP  Active Symptoms: 1. Severe Dyspnea 2. Grief  Psycho-social/Spiritual: I met with both patient and her son. They clearly disagree, but her son is trying support her decision to not pursue any additional medical interventions.  Prognosis: <6 months   Palliative Performance Scale: 20%  Recommendations:   Continue to aggressively diurese for comfort  Transition to oral antibiotics or even consider discontinuation of these on d/c  Continue oral cardiac medications  Support with O2 Prn  MOST form completed now and on the chart  Hospice referral  Roxanol PRN SL for pain and dyspnea    Time: 3;30PM-4:30PM Time Total: 60 min Greater than 50%  of this time was spent counseling and coordinating care related to the above assessment and plan.  Signed by: Roma Schanz, DO  01/09/2014, 4:27  PM  Please contact Palliative Medicine Team phone at [phone] for questions and concerns.

## 2014-01-09 NOTE — Plan of Care (Signed)
Problem: Phase II Progression Outcomes Goal: Pain controlled Outcome: Completed/Met Date Met:  01/09/14 Goal: Dyspnea controlled with activity Outcome: Not Progressing Goal: Walk in hall or up in chair TID Outcome: Not Progressing Goal: Discharge plan established Outcome: Completed/Met Date Met:  01/09/14 Goal: Tolerating diet Outcome: Not Progressing Poor appetite taking small amount fluids refusing to eat solids Goal: Fluid volume status improved Outcome: Progressing 2,000cc output after Lasix dose at 10am

## 2014-01-09 NOTE — Progress Notes (Signed)
Utilization Review Completed.Julie Haas T11/22/2015  

## 2014-01-09 NOTE — Progress Notes (Signed)
PT refused AM meds at 10 Lasix given IV. Reoffered meds at lunch and pt refused. Reoffered meds at 1430 pt willing to take Amiodarone, Zoloft, Protonix,  B/P 95/ 65. PT reports she just wants to go back to [place] she doesn't care about anything else she just wants to go back there. TC MD new orders noted. MD here talked extensively with pt attempted to call son left message. Attempted to call daughter no number. Son here upon Dr. Delanna Ahmadi arrival. [facility] to SW  Re: MOST form and need for hospice referral to be set up with coordinating pt discharge  With palliative care to [facility] in AM

## 2014-01-09 NOTE — Discharge Instructions (Signed)

## 2014-01-09 NOTE — Progress Notes (Signed)
Came back to speak with patient after nurse states that patient wants to go back to [place].  Attempted to call daughter at patient's request- does not remember the number.  Patient is resistant to cardioversion tomm.  Appears very weak.  Will check ABG.  Palliative care consult requested with patient's blessing.    Eulogio Bear

## 2014-01-09 NOTE — Progress Notes (Signed)
PROGRESS NOTE  Julie Haas PIR:[id number] DOB: 1925-11-16 DOA: 01/07/2014 PCP: Estill Dooms, MD  Assessment/Plan: Shortness of breath: Patient's shortness breath mostly due to combined HCAP and congestive heart failure exacerbation as evidenced by infiltration on chest x-ray and elevated proBNP. Pulmonary embolism is unlikely given that patient is Elliquis. 2-D echo 10/01/13 showed EF 55-60%. - sdu - treat with Vancomycin and Primaxin (patient is allergic to penicillin).  - blood culture pending - urine legionella pending and S. pneumococcal antigen negative - IV Lasix 80 mg twice a day ordered but patient's BP is low so not able to get - Continue metoprolol - Troponin 3 -Albuterol and Atrovent nebulizers  A Fib with RVR:  - continue Elliquis -Continue amiodarone- increased by cardiology -? For TEE and cardioversion tomm  HTN, stable cont home regimen    Code Status: DNR Family Communication: patient Disposition Plan:    Consultants:  cardiology  Procedures:      HPI/Subjective: Does not look well this AM- says she is tired  Objective: Filed Vitals:   01/09/14 0817  BP: 99/66  Pulse: 102  Temp: 98.5 F (36.9 C)  Resp: 20    Intake/Output Summary (Last 24 hours) at 01/09/14 0953 Last data filed at 01/09/14 6301  Gross per 24 hour  Intake   1066 ml  Output   1000 ml  Net     66 ml   Filed Weights   01/08/14 0100 01/08/14 0258 01/09/14 0439  Weight: 78.6 kg (173 lb 4.5 oz) 86.41 kg (190 lb 8 oz) 78.699 kg (173 lb 8 oz)    Exam:   General:  Sleepy- will awaken and answer questions- does not want Bipap back on  Cardiovascular: irr  Respiratory: coarse breath soudns  Abdomen: +Bs, soft  Musculoskeletal: LE edema   Data Reviewed: Basic Metabolic Panel:  Recent Labs Lab 01/03/14 01/07/14 2246 01/08/14 0420  NA 138 137 135*  K 3.6 5.0 4.5  CL  --  94* 92*  CO2  --  29 24  GLUCOSE  --  108* 113*  BUN 14 17 16   CREATININE 0.8  0.78 0.74  CALCIUM  --  9.0 8.5   Liver Function Tests:  Recent Labs Lab 01/07/14 2330 01/08/14 0420  AST 44* 43*  ALT 41* 36*  ALKPHOS 91 78  BILITOT 0.5 0.5  PROT 7.6 7.0  ALBUMIN 2.6* 2.2*   No results for input(s): LIPASE, AMYLASE in the last 168 hours. No results for input(s): AMMONIA in the last 168 hours. CBC:  Recent Labs Lab 01/07/14 2246 01/08/14 0420  WBC 11.7* 9.9  NEUTROABS  --  7.7  HGB 10.8* 10.4*  HCT 34.1* 33.7*  MCV 91.7 92.1  PLT 485* 381   Cardiac Enzymes:  Recent Labs Lab 01/08/14 0420 01/08/14 1034 01/08/14 1442  TROPONINI <0.30 <0.30 <0.30   BNP (last 3 results)  Recent Labs  11/09/13 0658 11/17/13 0322 01/07/14 2250  PROBNP 1801.0* 1747.0* 4510.0*   CBG: No results for input(s): GLUCAP in the last 168 hours.  Recent Results (from the past 240 hour(s))  MRSA PCR Screening     Status: None   Collection Time: 01/08/14  6:46 AM  Result Value Ref Range Status   MRSA by PCR NEGATIVE NEGATIVE Final    Comment:        The GeneXpert MRSA Assay (FDA approved for NASAL specimens only), is one component of a comprehensive MRSA colonization surveillance program. It is not intended to diagnose MRSA  infection nor to guide or monitor treatment for MRSA infections.      Studies: Dg Chest Port 1 View  01/07/2014   CLINICAL DATA:  Shortness of breath and palpitations. Recent hospitalization for new onset atrial fibrillation. Subsequent encounter.  EXAM: PORTABLE CHEST - 1 VIEW  COMPARISON:  11/17/2013 and 11/10/2013 radiographs; CT 10/11/2013.  FINDINGS: 2255 hr. There is stable cardiomegaly and aortic atherosclerosis. Small bilateral pleural effusions are present. There are bilateral airspace opacities which are similar to the most recent examination. These have increased from radiographs obtained 3 months ago. These were evaluated by CT in August and appear progressive since then. Left glenohumeral degenerative changes are present. There  are no acute osseous findings. Several telemetry leads overlie the chest.  IMPRESSION: Persistent diffuse bilateral airspace opacities and bilateral pleural effusions, similar to most recent examination of 6 weeks ago, but progressive over the last several months. Although there may be a component of edema, the findings are suspicious for an infectious or inflammatory process. Correlate clinically.   Electronically Signed   By: Camie Patience M.D.   On: 01/07/2014 23:11    Scheduled Meds: . amiodarone  400 mg Oral Daily  . apixaban  5 mg Oral BID  . furosemide  80 mg Intravenous BID  . imipenem-cilastatin  500 mg Intravenous 3 times per day  . metoprolol  50 mg Oral BID  . multivitamin-lutein  1 capsule Oral BID  . pantoprazole  40 mg Oral Daily  . sertraline  12.5 mg Oral Daily  . sodium chloride  3 mL Intravenous Q12H  . vancomycin  1,000 mg Intravenous Q12H  . vitamin E  1,000 Units Oral Daily   Continuous Infusions: . sodium chloride     Antibiotics Given (last 72 hours)    Date/Time Action Medication Dose Rate   01/08/14 0549 Given   imipenem-cilastatin (PRIMAXIN) 500 mg in sodium chloride 0.9 % 100 mL IVPB 500 mg 200 mL/hr   01/08/14 1216 Given   vancomycin (VANCOCIN) IVPB 1000 mg/200 mL premix 1,000 mg 200 mL/hr   01/08/14 1533 Given   imipenem-cilastatin (PRIMAXIN) 500 mg in sodium chloride 0.9 % 100 mL IVPB 500 mg 200 mL/hr   01/08/14 2148 Given   imipenem-cilastatin (PRIMAXIN) 500 mg in sodium chloride 0.9 % 100 mL IVPB 500 mg 200 mL/hr   01/08/14 2340 Given   vancomycin (VANCOCIN) IVPB 1000 mg/200 mL premix 1,000 mg 200 mL/hr   01/09/14 0622 Given   imipenem-cilastatin (PRIMAXIN) 500 mg in sodium chloride 0.9 % 100 mL IVPB 500 mg 200 mL/hr      Principal Problem:   HCAP (healthcare-associated pneumonia) Active Problems:   HTN (hypertension)   Hyperlipidemia   Varicose veins of lower extremities with inflammation   Mild aortic stenosis with normal LVF   Depression    Anemia of chronic disease   GERD (gastroesophageal reflux disease)   Atrial fibrillation with RVR   DNR (do not resuscitate)   Acute exacerbation of congestive heart failure    Time spent: 35 min    Eshaal Duby  [facility] Pager [phone] If 7PM-7AM, please contact night-coverage at [email or web address], password [facility] 01/09/2014, 9:53 AM  LOS: 2 days

## 2014-01-09 NOTE — Progress Notes (Signed)
Called daughter to update- had to leave message Janett Billow

## 2014-01-10 ENCOUNTER — Encounter (HOSPITAL_COMMUNITY): Payer: Self-pay | Admitting: Physician Assistant

## 2014-01-10 DIAGNOSIS — Z66 Do not resuscitate: Secondary | ICD-10-CM

## 2014-01-10 LAB — RESPIRATORY VIRUS PANEL
Adenovirus: NOT DETECTED
Influenza A H1: NOT DETECTED
Influenza A H3: NOT DETECTED
Influenza A: NOT DETECTED
Influenza B: NOT DETECTED
Metapneumovirus: NOT DETECTED
PARAINFLUENZA 3 A: NOT DETECTED
Parainfluenza 1: NOT DETECTED
Parainfluenza 2: NOT DETECTED
RHINOVIRUS: NOT DETECTED
Respiratory Syncytial Virus A: NOT DETECTED
Respiratory Syncytial Virus B: NOT DETECTED

## 2014-01-10 LAB — LEGIONELLA ANTIGEN, URINE

## 2014-01-10 LAB — CG4 I-STAT (LACTIC ACID): Lactic Acid, Venous: 1.39 mmol/L (ref 0.5–2.2)

## 2014-01-10 NOTE — Clinical Social Work Psychosocial (Signed)
     Clinical Social Work Department BRIEF PSYCHOSOCIAL ASSESSMENT 01/10/2014  Patient:  Julie Haas, Julie Haas     Account Number:  [id number]     Admit date:  01/07/2014  Clinical Social Worker:  Adair Laundry  Date/Time:  01/10/2014 01:14 PM  Referred by:  Physician  Date Referred:  01/10/2014 Referred for  SNF Placement   Other Referral:   Interview type:  Patient Other interview type:    PSYCHOSOCIAL DATA Living Status:  FACILITY Admitted from facility:  [facility] [place] Level of care:  [place] Primary support name:  Lasonya Hubner Primary support relationship to patient:  CHILD, ADULT Degree of support available:   Pt has strong support    CURRENT CONCERNS Current Concerns  Post-Acute Placement   Other Concerns:    SOCIAL WORK ASSESSMENT / PLAN CSW visited pt room and had brief discussion about dc plan. Pt not wanting to talk much with CSW and reported to [place] "it's all the same I don't have anything more to talk about". CSW confirmed with pt that she would like to return to [facility] tomorrow. Pt expressed excitement about being able to return to facility tomorrow. Pt informed CSW she will need non-emergent ambulance transport. Pt reported at this point there was nothing else she wanted to discuss. CSW to visit pt tomorrow and assist with discharge.   Assessment/plan status:  Psychosocial Support/Ongoing Assessment of Needs Other assessment/ plan:   Information/referral to community resources:   None needed    PATIENTS/FAMILYS RESPONSE TO PLAN OF CARE: Pt is ready to return to SNF

## 2014-01-10 NOTE — Plan of Care (Signed)
Problem: Phase I Progression Outcomes Goal: Voiding-avoid urinary catheter unless indicated Outcome: Not Met (add Reason) Pt to be d/c'd with foley. Goal: Other Phase I Outcomes/Goals Outcome: Completed/Met Date Met:  01/10/14  Problem: Phase II Progression Outcomes Goal: Tolerating diet Outcome: Completed/Met Date Met:  01/10/14 Goal: Fluid volume status improved Outcome: Completed/Met Date Met:  01/10/14 Goal: Case manager referral Outcome: Completed/Met Date Met:  01/10/14 Goal: Begin discharge teaching Outcome: Completed/Met Date Met:  01/10/14

## 2014-01-10 NOTE — Progress Notes (Signed)
PROGRESS NOTE  Julie Haas YYT:[id number] DOB: 27-Nov-1925 DOA: 01/07/2014 PCP: Estill Dooms, MD  Assessment/Plan: Shortness of breath: Patient's shortness breath mostly due to combined HCAP and congestive heart failure exacerbation as evidenced by infiltration on chest x-ray and elevated proBNP. Pulmonary embolism is unlikely given that patient is Elliquis. 2-D echo 10/01/13 showed EF 55-60%. - treat with Vancomycin and Primaxin (patient is allergic to penicillin).  - blood culture NGTD - urine legionella pending and S. pneumococcal antigen negative - IV Lasix 80 mg twice a day if BP tolerates - Continue metoprolol - Troponin 3 -Albuterol and Atrovent nebulizers  A Fib with RVR:  - continue Elliquis -Continue amiodarone- increased by cardiology Refusing TEE/cardioversion  HTN, stable cont home regimen    Code Status: DNR Family Communication: patient/spoke with daughter on phone Disposition Plan:    Consultants:  Cardiology  Palliative care  Procedures:      HPI/Subjective: anxious to get back [facility]- but agreeable to stay of 1 more day of IV lasix  Objective: Filed Vitals:   01/10/14 1010  BP: 94/53  Pulse: 96  Temp:   Resp: 29    Intake/Output Summary (Last 24 hours) at 01/10/14 1225 Last data filed at 01/10/14 0900  Gross per 24 hour  Intake   1026 ml  Output   1145 ml  Net   -119 ml   Filed Weights   01/08/14 0100 01/08/14 0258 01/09/14 0439  Weight: 78.6 kg (173 lb 4.5 oz) 86.41 kg (190 lb 8 oz) 78.699 kg (173 lb 8 oz)    Exam:   General:  Sleepy- will awaken and answer questions- does not want Bipap back on  Cardiovascular: irr  Respiratory: no wheezing  Abdomen: +Bs, soft  Musculoskeletal: LE edema   Data Reviewed: Basic Metabolic Panel:  Recent Labs Lab 01/07/14 2246 01/08/14 0420 01/09/14 1054  NA 137 135* 135*  K 5.0 4.5 3.8  CL 94* 92* 93*  CO2 29 24 31   GLUCOSE 108* 113* 97  BUN 17 16 14    CREATININE 0.78 0.74 0.66  CALCIUM 9.0 8.5 8.4   Liver Function Tests:  Recent Labs Lab 01/07/14 2330 01/08/14 0420  AST 44* 43*  ALT 41* 36*  ALKPHOS 91 78  BILITOT 0.5 0.5  PROT 7.6 7.0  ALBUMIN 2.6* 2.2*   No results for input(s): LIPASE, AMYLASE in the last 168 hours. No results for input(s): AMMONIA in the last 168 hours. CBC:  Recent Labs Lab 01/07/14 2246 01/08/14 0420 01/09/14 1054  WBC 11.7* 9.9 9.5  NEUTROABS  --  7.7  --   HGB 10.8* 10.4* 9.7*  HCT 34.1* 33.7* 30.7*  MCV 91.7 92.1 91.9  PLT 485* 381 381   Cardiac Enzymes:  Recent Labs Lab 01/08/14 0420 01/08/14 1034 01/08/14 1442  TROPONINI <0.30 <0.30 <0.30   BNP (last 3 results)  Recent Labs  11/09/13 0658 11/17/13 0322 01/07/14 2250  PROBNP 1801.0* 1747.0* 4510.0*   CBG: No results for input(s): GLUCAP in the last 168 hours.  Recent Results (from the past 240 hour(s))  Culture, blood (routine x 2)     Status: None (Preliminary result)   Collection Time: 01/07/14 11:44 PM  Result Value Ref Range Status   Specimen Description BLOOD RIGHT FOREARM  Final   Special Requests BOTTLES DRAWN AEROBIC AND ANAEROBIC 10CC  Final   Culture  Setup Time   Final    01/08/2014 03:09 Performed at [employer] [facility]  Final           BLOOD CULTURE RECEIVED NO GROWTH TO DATE CULTURE WILL BE HELD FOR 5 DAYS BEFORE ISSUING A FINAL NEGATIVE REPORT Performed at [facility]    Report Status PENDING  Incomplete  Culture, blood (routine x 2)     Status: None (Preliminary result)   Collection Time: 01/07/14 11:50 PM  Result Value Ref Range Status   Specimen Description BLOOD RIGHT ANTECUBITAL  Final   Special Requests BOTTLES DRAWN AEROBIC AND ANAEROBIC 5CC  Final   Culture  Setup Time   Final    01/08/2014 04:21 Performed at [facility]    Culture   Final           BLOOD CULTURE RECEIVED NO GROWTH TO DATE CULTURE WILL BE HELD FOR 5 DAYS BEFORE ISSUING A FINAL NEGATIVE  REPORT Performed at [facility]    Report Status PENDING  Incomplete  Urine culture     Status: None   Collection Time: 01/08/14  4:01 AM  Result Value Ref Range Status   Specimen Description URINE, CATHETERIZED  Final   Special Requests NONE  Final   Culture  Setup Time   Final    01/08/2014 14:53 Performed at [place] Performed at [facility]   Final   Culture NO GROWTH Performed at [facility]   Final   Report Status 01/09/2014 FINAL  Final  MRSA PCR Screening     Status: None   Collection Time: 01/08/14  6:46 AM  Result Value Ref Range Status   MRSA by PCR NEGATIVE NEGATIVE Final    Comment:        The GeneXpert MRSA Assay (FDA approved for NASAL specimens only), is one component of a comprehensive MRSA colonization surveillance program. It is not intended to diagnose MRSA infection nor to guide or monitor treatment for MRSA infections.      Studies: No results found.  Scheduled Meds: . amiodarone  400 mg Oral Daily  . apixaban  5 mg Oral BID  . furosemide  80 mg Intravenous BID  . imipenem-cilastatin  500 mg Intravenous 3 times per day  . metoprolol  50 mg Oral BID  . multivitamin-lutein  1 capsule Oral BID  . pantoprazole  40 mg Oral Daily  . sodium chloride  3 mL Intravenous Q12H  . vancomycin  1,000 mg Intravenous Q12H  . vitamin E  1,000 Units Oral Daily   Continuous Infusions: . sodium chloride Stopped (01/10/14 1000)   Antibiotics Given (last 72 hours)    Date/Time Action Medication Dose Rate   01/08/14 0549 Given   imipenem-cilastatin (PRIMAXIN) 500 mg in sodium chloride 0.9 % 100 mL IVPB 500 mg 200 mL/hr   01/08/14 1216 Given   vancomycin (VANCOCIN) IVPB 1000 mg/200 mL premix 1,000 mg 200 mL/hr   01/08/14 1533 Given   imipenem-cilastatin (PRIMAXIN) 500 mg in sodium chloride 0.9 % 100 mL IVPB 500 mg 200 mL/hr   01/08/14 2148 Given   imipenem-cilastatin (PRIMAXIN) 500 mg in  sodium chloride 0.9 % 100 mL IVPB 500 mg 200 mL/hr   01/08/14 2340 Given   vancomycin (VANCOCIN) IVPB 1000 mg/200 mL premix 1,000 mg 200 mL/hr   01/09/14 0622 Given   imipenem-cilastatin (PRIMAXIN) 500 mg in sodium chloride 0.9 % 100 mL IVPB 500 mg 200 mL/hr   01/09/14 1300 Given   vancomycin (VANCOCIN) IVPB 1000 mg/200 mL premix 1,000  mg 200 mL/hr   01/09/14 1420 Given   imipenem-cilastatin (PRIMAXIN) 500 mg in sodium chloride 0.9 % 100 mL IVPB 500 mg 200 mL/hr   01/09/14 2200 Given   imipenem-cilastatin (PRIMAXIN) 500 mg in sodium chloride 0.9 % 100 mL IVPB 500 mg 200 mL/hr   01/10/14 0054 Given   vancomycin (VANCOCIN) IVPB 1000 mg/200 mL premix 1,000 mg 200 mL/hr   01/10/14 0549 Given   imipenem-cilastatin (PRIMAXIN) 500 mg in sodium chloride 0.9 % 100 mL IVPB 500 mg 200 mL/hr      Principal Problem:   HCAP (healthcare-associated pneumonia) Active Problems:   HTN (hypertension)   Hyperlipidemia   Varicose veins of lower extremities with inflammation   Mild aortic stenosis with normal LVF   Depression   Anemia of chronic disease   GERD (gastroesophageal reflux disease)   Atrial fibrillation with RVR   DNR (do not resuscitate)   Acute exacerbation of congestive heart failure   POLST (Physician Orders for Life-Sustaining Treatment)    Time spent: 35 min    VANN, JESSICA  [facility] Pager [phone] If 7PM-7AM, please contact night-coverage at [email or web address], password [facility] 01/10/2014, 12:25 PM  LOS: 3 days

## 2014-01-10 NOTE — Progress Notes (Signed)
OT Cancellation Note and Discharge  Patient Details Name: JOANANN MIES MRN: [id number] DOB: [birth year]   Cancelled Treatment:    Reason Eval/Treat Not Completed: Other (comment). Noted Pallative Care note with pt wanting to D/C back to [facility] with full hospice care today. Pt will not get OT at SNF due to pt choosing full hospice services, thus we will not evaluate here. Acute OT will sign off.  Almon Register [phone] 01/10/2014, 7:35 AM

## 2014-01-10 NOTE — Progress Notes (Signed)
   01/10/14 1400  Clinical Encounter Type  Visited With Patient;Other (Comment) Doristine Bosworth)  Visit Type Initial;Spiritual support  Referral From Social work  Consult/Referral To [employer] visited pt at suggestion of Education officer, museum. Pt's pastor was present. Pt did not need chaplain support at this time.  Vanetta Mulders 01/10/2014 2:40 PM

## 2014-01-10 NOTE — Progress Notes (Signed)
Patient Name: Julie Haas Date of Encounter: 01/10/2014     Principal Problem:   HCAP (healthcare-associated pneumonia) Active Problems:   HTN (hypertension)   Hyperlipidemia   Varicose veins of lower extremities with inflammation   Mild aortic stenosis with normal LVF   Depression   Anemia of chronic disease   GERD (gastroesophageal reflux disease)   Atrial fibrillation with RVR   DNR (do not resuscitate)   Acute exacerbation of congestive heart failure   POLST (Physician Orders for Life-Sustaining Treatment)    SUBJECTIVE  Feeling fine. Still does not want DCCV. No CP or SOB, although continues to sleep in arm chair due to orthopnea.   CURRENT MEDS . amiodarone  400 mg Oral Daily  . apixaban  5 mg Oral BID  . furosemide  80 mg Intravenous BID  . imipenem-cilastatin  500 mg Intravenous 3 times per day  . metoprolol  50 mg Oral BID  . multivitamin-lutein  1 capsule Oral BID  . pantoprazole  40 mg Oral Daily  . sertraline  25 mg Oral Daily  . sodium chloride  3 mL Intravenous Q12H  . vancomycin  1,000 mg Intravenous Q12H  . vitamin E  1,000 Units Oral Daily   . sodium chloride Stopped (01/10/14 1000)    OBJECTIVE  Filed Vitals:   01/09/14 2202 01/10/14 0321 01/10/14 0550 01/10/14 0651  BP: 100/58 82/55 89/50  103/60  Pulse: 108 96 88 90  Temp:   98.3 F (36.8 C)   TempSrc:   Oral   Resp:  22 24 26   Height:      Weight:      SpO2:  96% 93% 95%    Intake/Output Summary (Last 24 hours) at 01/10/14 0757 Last data filed at 01/10/14 0659  Gross per 24 hour  Intake    789 ml  Output   1145 ml  Net   -356 ml   Filed Weights   01/08/14 0100 01/08/14 0258 01/09/14 0439  Weight: 173 lb 4.5 oz (78.6 kg) 190 lb 8 oz (86.41 kg) 173 lb 8 oz (78.699 kg)    PHYSICAL EXAM  General: Pleasant, NAD. Neuro: Alert and oriented X 3. Moves all extremities spontaneously. Psych: Normal affect. HEENT:  Normal  Neck: Supple without bruits or JVD. Lungs:  Resp  regular and unlabored, mild crackles at bases.  Heart: irreg irreg. no s3, s4, or murmurs. Abdomen: Soft, non-tender, non-distended, BS + x 4.  Extremities: No clubbing, cyanosis. 1+ pitting edema. DP/PT/Radials 2+ and equal bilaterally.  Accessory Clinical Findings  CBC  Recent Labs  01/08/14 0420 01/09/14 1054  WBC 9.9 9.5  NEUTROABS 7.7  --   HGB 10.4* 9.7*  HCT 33.7* 30.7*  MCV 92.1 91.9  PLT 381 458   Basic Metabolic Panel  Recent Labs  01/08/14 0420 01/09/14 1054  NA 135* 135*  K 4.5 3.8  CL 92* 93*  CO2 24 31  GLUCOSE 113* 97  BUN 16 14  CREATININE 0.74 0.66  CALCIUM 8.5 8.4   Liver Function Tests  Recent Labs  01/07/14 2330 01/08/14 0420  AST 44* 43*  ALT 41* 36*  ALKPHOS 91 78  BILITOT 0.5 0.5  PROT 7.6 7.0  ALBUMIN 2.6* 2.2*    Cardiac Enzymes  Recent Labs  01/08/14 0420 01/08/14 1034 01/08/14 1442  TROPONINI <0.30 <0.30 <0.30    TELE  afib with CVR, freq PVCs  Radiology/Studies  Dg Chest Port 1 View  01/07/2014   CLINICAL DATA:  Shortness of breath and palpitations. Recent hospitalization for new onset atrial fibrillation. Subsequent encounter.  EXAM: PORTABLE CHEST - 1 VIEW  COMPARISON:  11/17/2013 and 11/10/2013 radiographs; CT 10/11/2013.  FINDINGS: 2255 hr. There is stable cardiomegaly and aortic atherosclerosis. Small bilateral pleural effusions are present. There are bilateral airspace opacities which are similar to the most recent examination. These have increased from radiographs obtained 3 months ago. These were evaluated by CT in August and appear progressive since then. Left glenohumeral degenerative changes are present. There are no acute osseous findings. Several telemetry leads overlie the chest.  IMPRESSION: Persistent diffuse bilateral airspace opacities and bilateral pleural effusions, similar to most recent examination of 6 weeks ago, but progressive over the last several months. Although there may be a component of  edema, the findings are suspicious for an infectious or inflammatory process. Correlate clinically.   Electronically Signed   By: Camie Patience M.D.   On: 01/07/2014 23:11      ASSESSMENT AND PLAN  DAVIANNA DEUTSCHMAN is a 80 y.o. female with a history of chronic diastolic CHF, persistent atrial fibrillation, HLD, HTN, carotid artery stenosis, and aortic stenosis who presented to [facility] on 01/08/14 with SOB and cough. She was found to have acute on chronic diastolic CHF, HCAP and afib with RVR.  Persistent atrial fibrillation which is symptomatic- currently rate controlled in 90s- low 100s.  -- Patient does not DCCV and is feeling symptomatically a little better today.  -- Continue Eliquis 5mg  BID, amiodarone 400mg  qd and Lopressor 50mg  BID  Acute on chronic diastolic heart failure -- Still with LE edema and some mild crackles on exam.  -- Continue IV Lasix 80mg  BID. Creat stable   Aortic stenosis- mild on 2D ECHO 09/2013. Valve area 1.23  Cm^2  Pneumonia- per IM  End-of-life issues -- Seen by palliative care with new established goals  Patient is very clear on the following:  No cardioversion now or in the future  No re-hospitalization  If she declines she wants comfort care only  She does not want her life prolonged in any circumstance  She wants to be discharged in the AM to [facility] and desires full hospice care.  Judy Pimple PA-C   Pager [phone]   The patient was seen, examined and discussed with Lorretta Harp, PA-C and I agree with the above.   80 year old female admitted with persistent a-fib admitted with acute on chronic CHF in the settings of CAP and a-fib with RVR. The patient doesn't wish to undergo DCCV and prefers comfort measures only - no aggressive treatment. She is now rate controlled with increased doses of amiodarone and ongoing metoprolol. Still fluid overloaded, so far -1400 cc in 48 hours. Limiting factor is hypotension. Normal Crea. We  will continue high doses of iv Lasix 80 mg po BID and reevaluate in the morning.  On antibiotics for pneumonia.  Dorothy Spark 01/10/2014

## 2014-01-11 ENCOUNTER — Non-Acute Institutional Stay (SKILLED_NURSING_FACILITY): Payer: Medicare Other | Admitting: Nurse Practitioner

## 2014-01-11 ENCOUNTER — Encounter: Payer: Self-pay | Admitting: Nurse Practitioner

## 2014-01-11 DIAGNOSIS — J189 Pneumonia, unspecified organism: Secondary | ICD-10-CM

## 2014-01-11 DIAGNOSIS — K219 Gastro-esophageal reflux disease without esophagitis: Secondary | ICD-10-CM

## 2014-01-11 DIAGNOSIS — F32A Depression, unspecified: Secondary | ICD-10-CM

## 2014-01-11 DIAGNOSIS — I482 Chronic atrial fibrillation, unspecified: Secondary | ICD-10-CM

## 2014-01-11 DIAGNOSIS — R609 Edema, unspecified: Secondary | ICD-10-CM

## 2014-01-11 DIAGNOSIS — I10 Essential (primary) hypertension: Secondary | ICD-10-CM

## 2014-01-11 DIAGNOSIS — I5031 Acute diastolic (congestive) heart failure: Secondary | ICD-10-CM

## 2014-01-11 DIAGNOSIS — F329 Major depressive disorder, single episode, unspecified: Secondary | ICD-10-CM

## 2014-01-11 DIAGNOSIS — Z7901 Long term (current) use of anticoagulants: Secondary | ICD-10-CM

## 2014-01-11 DIAGNOSIS — D638 Anemia in other chronic diseases classified elsewhere: Secondary | ICD-10-CM

## 2014-01-11 MED ORDER — LEVOFLOXACIN IN D5W 750 MG/150ML IV SOLN
750.0000 mg | INTRAVENOUS | Status: DC
  Filled 2014-01-11: qty 150

## 2014-01-11 MED ORDER — METOPROLOL TARTRATE 25 MG PO TABS: 25.0000 mg | ORAL_TABLET | Freq: Two times a day (BID) | ORAL | Status: DC

## 2014-01-11 MED ORDER — LEVOFLOXACIN 750 MG PO TABS
750.0000 mg | ORAL_TABLET | Freq: Every day | ORAL | Status: DC
Start: 2014-01-11 — End: 2014-01-20

## 2014-01-11 MED ORDER — METOPROLOL TARTRATE 50 MG PO TABS
50.0000 mg | ORAL_TABLET | Freq: Two times a day (BID) | ORAL | Status: DC
  Administered 2014-01-11: 25 mg via ORAL

## 2014-01-11 MED ORDER — ONDANSETRON HCL 4 MG/2ML IJ SOLN
4.0000 mg | Freq: Four times a day (QID) | INTRAMUSCULAR | Status: DC | PRN
  Administered 2014-01-11: 4 mg via INTRAVENOUS
  Filled 2014-01-11: qty 2

## 2014-01-11 MED ORDER — AMIODARONE HCL 400 MG PO TABS: 400.0000 mg | ORAL_TABLET | Freq: Every day | ORAL | Status: DC

## 2014-01-11 MED ORDER — POTASSIUM CHLORIDE CRYS ER 20 MEQ PO TBCR: 40.0000 meq | EXTENDED_RELEASE_TABLET | Freq: Once | ORAL | Status: DC

## 2014-01-11 MED ORDER — FUROSEMIDE 10 MG/ML IJ SOLN
40.0000 mg | Freq: Once | INTRAMUSCULAR | Status: AC
  Administered 2014-01-11: 40 mg via INTRAVENOUS
  Filled 2014-01-11: qty 4

## 2014-01-11 MED ORDER — LEVOFLOXACIN 750 MG PO TABS
750.0000 mg | ORAL_TABLET | Freq: Every day | ORAL | Status: DC
  Administered 2014-01-11: 750 mg via ORAL
  Filled 2014-01-11: qty 1

## 2014-01-11 MED ORDER — MORPHINE SULFATE (CONCENTRATE) 10 MG/0.5ML PO SOLN: 10.0000 mg | ORAL | Status: DC | PRN

## 2014-01-11 NOTE — Assessment & Plan Note (Signed)
11/30/13 Hgb 9.7 12/30/13 Hgb 11.3 01/09/14 Hgb 9.7 01/11/14 update CBC in am.

## 2014-01-11 NOTE — Assessment & Plan Note (Signed)
Complete Levaquin at [facility]

## 2014-01-11 NOTE — Assessment & Plan Note (Signed)
Controlled. Takes Metoprolol 25mg  bid and Furosemide 80mg  bid.

## 2014-01-11 NOTE — Progress Notes (Signed)
PT Cancellation Note  Patient Details Name: Julie Haas MRN: [id number] DOB: 30-Aug-1925   Cancelled Treatment:    Reason Eval/Treat Not Completed: Medical issues which prohibited therapy.  Pt told nurse that she did not want PT.  She wants to go to [facility] with Hospice care.  Will sign off per pt request.  Thanks.   Irwin Brakeman F 01/11/2014, 11:17 AM  [facility] [phone] (pager)

## 2014-01-11 NOTE — Assessment & Plan Note (Signed)
C/o nausea ? Associated with Kcl-stable-continue Omeprazole 20mg  po daily and prn Phenergan 12.5mg  q6h prn. Observe

## 2014-01-11 NOTE — Progress Notes (Signed)
Patient with difficulty breathing and O2 sats 83%, coughing up small amount of white, frothy sputum.  Lung sounds--faint crackles with expiratory wheezing.  Albuterol nebs given, RT notified.  O2 sats and SOB improved after nebs, sats 93%; however, lungs sound increasingly wet with crackles more pronounced in upper lobes bilaterally.  Dr. Carles Collet notified.  Will continue to monitor.  Report given to oncoming RN.

## 2014-01-11 NOTE — Progress Notes (Signed)
CSW (Clinical Social Worker) prepared pt dc packet and placed with shadow chart. CSW arranged non-emergent ambulance transport. Pt, pt family, pt nurse, and facility informed. CSW signing off.  Julie Haas, LCSWA [phone]  

## 2014-01-11 NOTE — Plan of Care (Signed)
Problem: Consults Goal: Heart Failure Patient Education (See Patient Education module for education specifics.)  Outcome: Completed/Met Date Met:  01/11/14 Goal: Skin Care Protocol Initiated - if Braden Score 18 or less If consults are not indicated, leave blank or document N/A  Outcome: Not Applicable Date Met:  85/46/27  Problem: Phase II Progression Outcomes Goal: Dyspnea controlled with activity Outcome: Adequate for Discharge Goal: Walk in hall or up in chair TID Outcome: Not Met (add Reason) Goal: Other Phase II Outcomes/Goals Outcome: Completed/Met Date Met:  01/11/14  Problem: Phase III Progression Outcomes Goal: Pain controlled on oral analgesia Outcome: Completed/Met Date Met:  01/11/14 Goal: Activity at appropriate level-compared to baseline (UP IN CHAIR FOR HEMODIALYSIS)  Outcome: Completed/Met Date Met:  01/11/14 Goal: Tolerating diet Outcome: Completed/Met Date Met:  01/11/14 Goal: Dyspnea controlled with activity Outcome: Not Met (add Reason) Goal: Discharge plan remains appropriate-arrangements made Outcome: Completed/Met Date Met:  01/11/14 Goal: Fluid volume status improved Outcome: Adequate for Discharge Goal: Other Phase III Outcomes/Goals Outcome: Completed/Met Date Met:  01/11/14

## 2014-01-11 NOTE — Progress Notes (Signed)
Patient Name: Julie Haas Date of Encounter: 01/11/2014  Principal Problem:   HCAP (healthcare-associated pneumonia) Active Problems:   HTN (hypertension)   Hyperlipidemia   Varicose veins of lower extremities with inflammation   Mild aortic stenosis with normal LVF   Depression   Anemia of chronic disease   GERD (gastroesophageal reflux disease)   Atrial fibrillation with RVR   DNR (do not resuscitate)   Acute exacerbation of congestive heart failure   POLST (Physician Orders for Life-Sustaining Treatment)   Primary Cardiologist: Dr. Martinique  Patient Profile: 80 y.o. female with a history of chronic diastolic CHF, persistent atrial fibrillation, HLD, HTN, carotid artery stenosis, and aortic stenosis, presented to [facility] on 01/08/14 with SOB and cough. She was found to have acute on chronic diastolic CHF, HCAP and afib with RVR.  SUBJECTIVE: SOB, very weak, wants to go back to [facility], when asked about Comfort Care, said "You make the decision". Does not know if she has a HCPOA, thinks it might be her son.  OBJECTIVE Filed Vitals:   01/11/14 0040 01/11/14 0045 01/11/14 0120 01/11/14 0521  BP: 96/62 92/55 95/60  99/68  Pulse: 112 121 109 100  Temp:    98 F (36.7 C)  TempSrc:    Oral  Resp: 33 33 25 27  Height:      Weight:      SpO2: 93% 94% 97% 98%    Intake/Output Summary (Last 24 hours) at 01/11/14 1035 Last data filed at 01/11/14 0900  Gross per 24 hour  Intake   1000 ml  Output   2400 ml  Net  -1400 ml   Filed Weights   01/08/14 0100 01/08/14 0258 01/09/14 0439  Weight: 173 lb 4.5 oz (78.6 kg) 190 lb 8 oz (86.41 kg) 173 lb 8 oz (78.699 kg)    PHYSICAL EXAM General: Well developed, well nourished, female in no acute distress. Head: Normocephalic, atraumatic.  Neck: Supple without bruits, JVD 9-10 cm. Lungs:  Resp regular and unlabored, dense rales. Heart: Irreg irreg, S1, S2, no S3, S4, + systolic murmur; no rub. Abdomen: Soft, non-tender,  non-distended, BS + x 4.  Extremities: No clubbing, cyanosis, trace edema.  Neuro: Alert and oriented X 2. Moves all extremities spontaneously. Psych: Normal affect.  LABS: CBC: Recent Labs  01/09/14 1054  WBC 9.5  HGB 9.7*  HCT 30.7*  MCV 91.9  PLT 315   Basic Metabolic Panel: Recent Labs  01/09/14 1054  NA 135*  K 3.8  CL 93*  CO2 31  GLUCOSE 97  BUN 14  CREATININE 0.66  CALCIUM 8.4   Cardiac Enzymes: Recent Labs  01/08/14 1442  TROPONINI <0.30   BNP: PRO B NATRIURETIC PEPTIDE (BNP)  Date/Time Value Ref Range Status  01/07/2014 10:50 PM 4510.0* 0 - 450 pg/mL Final  11/17/2013 03:22 AM 1747.0* 0 - 450 pg/mL Final   TELE: Atrial fib, RVR at times.  Current Medications:  . amiodarone  400 mg Oral Daily  . apixaban  5 mg Oral BID  . furosemide  80 mg Intravenous BID  . imipenem-cilastatin  500 mg Intravenous 3 times per day  . metoprolol  50 mg Oral BID  . multivitamin-lutein  1 capsule Oral BID  . pantoprazole  40 mg Oral Daily  . sodium chloride  3 mL Intravenous Q12H  . vancomycin  1,000 mg Intravenous Q12H  . vitamin E  1,000 Units Oral Daily   . sodium chloride Stopped (01/10/14 1000)  ASSESSMENT AND PLAN: Principal Problem:   HCAP (healthcare-associated pneumonia) - per IM  Active Problems:   HTN (hypertension)- per IM, having problems with hypotension now, will put parameters on metoprolol and decrease dose as needed.    Hyperlipidemia - per IM    Varicose veins of lower extremities with inflammation- per IM    Mild aortic stenosis with normal LVF - follow with IV Lasix    Depression- per IM    Anemia of chronic disease- per IM    GERD (gastroesophageal reflux disease)- per IM    Atrial fibrillation with RVR - continue BB as BP will permit, currently diuresis is foremost problem, so will have to limit BB    DNR (do not resuscitate)- per IM, appropriate, Palliative Care consult called.    Acute exacerbation of congestive heart  failure- needs IV Lasix till d/c, then PO rx.    POLST (Physician Orders for Life-Sustaining Treatment)- per IM  Signed, Rosaria Ferries , PA-C 10:35 AM 01/11/2014  The patient was seen, examined and discussed with Rosaria Ferries, PA-C and I agree with the above.   80 year old female admitted with persistent a-fib admitted with acute on chronic CHF in the settings of CAP and a-fib with RVR.  The patient is DNR and doesn't wish to undergo DCCV and prefers comfort measures only - no aggressive treatment. She wishes to go home.  Our recommendation for discharge:  1. Switch iv Lasix to PO Lasix 80 mg PO BID, follow Crea 2. Continue amiodarone 400 mg PO daily for another week then switch to 200 mg po daily 3. Continue metoprolol 50 mg po BID 4. Continue Eliquis for anticoagulation  We will for an outpatient follow up with Dr. Martinique.    Dorothy Spark , MD 01/11/2014

## 2014-01-11 NOTE — Progress Notes (Signed)
ANTIBIOTIC CONSULT NOTE - Initial  Pharmacy Consult for Levaquin  Indication: HCAP  Allergies  Allergen Reactions  . Codeine     Unknown  . Cucumber Extract Nausea And Vomiting  . Diuretic [Buchu-Cornsil[place]-Ch Grass-Hydran]     Unknown  . Lipitor [Atorvastatin Calcium]     Unknown  . Penicillins Other (See Comments)    unknown  . Pravachol     Unknown    Patient Measurements: Height: 5\' 4"  (162.6 cm) Weight:  (pt in chair unable to stand) IBW/kg (Calculated) : 54.7  Vital Signs: Temp: 98 F (36.7 C) (11/24 0521) Temp Source: Oral (11/24 0521) BP: 102/56 mmHg (11/24 1057) Pulse Rate: 102 (11/24 1057)  Labs:  Recent Labs  01/09/14 1054  WBC 9.5  HGB 9.7*  PLT 381  CREATININE 0.66   Estimated Creatinine Clearance: 51.2 mL/min (by C-G formula based on Cr of 0.66).  Medical History: Past Medical History  Diagnosis Date  . Hyperlipidemia   . Retinal detachment   . Carotid artery stenosis 03/24/2012  . HTN (hypertension) 06/27/2009  . Other generalized ischemic cerebrovascular disease 06/27/2009  . Vitamin D deficiency 05/16/2009  . Family history of sudden cardiac death (SCD) 12/15/2008  . Varicose veins of lower extremities with inflammation 03/16/2003  . Atrial fibrillation, persistent   . Chronic diastolic CHF (congestive heart failure)      Assessment: 80yo female with HCAP currently on Primaxin and Vancomycin, now being discharged on Levaquin. WBC wnl. Pt remains afebrile. CrCl ~ 50-55 mL/min   Vanc 11/20>>11/24 Primaxin 11/20>>11/24 Levaquin 11/24>>  BCx x2 11/20>>ngtd  Urine Cx 11/21>>Neg  Goal of Therapy:  Vancomycin trough level 15-20 mcg/ml  Plan:  -Stop Vancomycin and Primaxin -Start Levaquin 750 mg by mouth daily -Monitor CBC, renal fx, cultures and patient's clinical progress  Albertina Parr, PharmD.  Clinical Pharmacist Pager [phone]

## 2014-01-11 NOTE — Progress Notes (Signed)
Pt discharged to [facility] with Hospice per MD order. Pt received and reviewed all discharge instructions and medication information including follow-up appointments and prescription information. Pt verbalized understanding. Pt alert and oriented at discharge with no complaints of pain. Pt IV and telemetry box removed prior to discharge. Report called to receiving RN at [facility]. Pt escorted to [facility] [place] via non-emergent ambulance transport. All belongings sent with pt. Lenna Sciara

## 2014-01-11 NOTE — Assessment & Plan Note (Addendum)
   No cardioversion now or in the future  No re-hospitalization  If she declines she wants comfort care only  She does not want her life prolonged in any circumstance  She wants to be discharged to [facility] and desires full hospice care.  Continue Lasix, off Metolazone and Kcl, will update CMP and BNP in am.   Morphine 10mg  q2hr prn available to her.

## 2014-01-11 NOTE — Assessment & Plan Note (Signed)
No bleeding.  Continue eliquis

## 2014-01-11 NOTE — Assessment & Plan Note (Signed)
edema BLE 1+ 01/11/14 continue Furosemide 80mg  bid-update CMP and BNP in am.

## 2014-01-11 NOTE — Progress Notes (Signed)
Spoke with patient and nurse.  Patient clear that she wants to go back to [facility] and understands her days are limited due to her heart and lungs.  She does not want to be re-hospitalized.  She spoke of her husband and says she had a good life because of him.   Eulogio Bear DO

## 2014-01-11 NOTE — Discharge Summary (Signed)
Physician Discharge Summary  Julie Haas EVO:[id number] DOB: 1926/02/19 DOA: 01/07/2014  PCP: Estill Dooms, MD  Admit date: 01/07/2014 Discharge date: 01/11/2014  Time spent: 35 minutes  Recommendations for Outpatient Follow-up:  Patient is very clear on the following:  No cardioversion now or in the future  No re-hospitalization  If she declines she wants comfort care only  She does not want her life prolonged in any circumstance  She wants to be discharged to [facility] and desires full hospice care.  Continue amiodarone 400 mg PO daily for another week then switch to 200 mg po daily   Discharge Diagnoses:  Principal Problem:   HCAP (healthcare-associated pneumonia) Active Problems:   HTN (hypertension)   Hyperlipidemia   Varicose veins of lower extremities with inflammation   Mild aortic stenosis with normal LVF   Depression   Anemia of chronic disease   GERD (gastroesophageal reflux disease)   Atrial fibrillation with RVR   DNR (do not resuscitate)   Acute exacerbation of congestive heart failure   POLST (Physician Orders for Life-Sustaining Treatment)   Discharge Condition: stable  Diet recommendation: cardiac  Filed Weights   01/08/14 0100 01/08/14 0258 01/09/14 0439  Weight: 78.6 kg (173 lb 4.5 oz) 86.41 kg (190 lb 8 oz) 78.699 kg (173 lb 8 oz)    History of present illness:  Julie Haas is a 80 y.o. female with PMH of HTN, diastolic CHF, A Fib, hyperlipidemia, GERD, depression, who presents with the worsening shortness of breath and mild dry cough.  Patient reports that she has mild shortness of breath at baseline because of congestive heart failure. Since yesterday her shortness of breath has been getting worse. She also has mild dry cough. She states that she uses oxygen at baseline of 3 liters at home. She does not have fever, chills, chest pain. Does not have pain over her calf areas. He has A. Fib, currently is on Elliquis, no bleeding  tendency. Reports that she is compliant to her diuretics, including Lasix 80 mg twice a day, and metolazone 2.5 mg daily. Patient does not have nausea, vomiting, abdominal pain, diarrhea. Denies any symptoms of UTI.  Work up in the ED demonstrates leukocytosis with WBC 11.7; temperature 100.2; negative troponin; and proBNP 4510. Patient is also found to have A. fib with RVR. Cardizem drip was started. Patient is admitted to inpatient for further evaluation and treatment.   Hospital Course:  80 year old female admitted with persistent a-fib admitted with acute on chronic CHF in the settings of CAP and a-fib with RVR.  The patient is DNR and doesn't wish to undergo DCCV and prefers comfort measures only - no aggressive treatment. She wishes to go home.  PO Lasix 80 mg PO BID Continue amiodarone 400 mg PO daily for another week then switch to 200 mg po daily Continue metoprolol 25 mg po BID Continue Eliquis for anticoagulation -if patient worsens, change to comfort care  Procedures:    Consultations:  Cardiology  Palliative care  Discharge Exam: Filed Vitals:   01/11/14 1057  BP: 102/56  Pulse: 102  Temp:   Resp:       Discharge Instructions You were cared for by a hospitalist during your hospital stay. If you have any questions about your discharge medications or the care you received while you were in the hospital after you are discharged, you can call the unit and asked to speak with the hospitalist on call if the hospitalist that  took care of you is not available. Once you are discharged, your primary care physician will handle any further medical issues. Please note that NO REFILLS for any discharge medications will be authorized once you are discharged, as it is imperative that you return to your primary care physician (or establish a relationship with a primary care physician if you do not have one) for your aftercare needs so that they can reassess your need for medications  and monitor your lab values.  Discharge Instructions    Diet - low sodium heart healthy    Complete by:  As directed      Discharge instructions    Complete by:  As directed   Patient is very clear on the following:     No cardioversion now or in the future     No re-hospitalization     If she declines she wants comfort care only     She does not want her life prolonged in any circumstance     She wants to be discharged to [facility] and desires full hospice     Increase activity slowly    Complete by:  As directed           Current Discharge Medication List    START taking these medications   Details  levofloxacin (LEVAQUIN) 750 MG tablet Take 1 tablet (750 mg total) by mouth daily. Qty: 3 tablet, Refills: 0    Morphine Sulfate (MORPHINE CONCENTRATE) 10 MG/0.5ML SOLN concentrated solution Take 0.5 mLs (10 mg total) by mouth every 2 (two) hours as needed for moderate pain, severe pain, anxiety or shortness of breath. Qty: 30 mL, Refills: 0      CONTINUE these medications which have CHANGED   Details  amiodarone (PACERONE) 400 MG tablet Take 1 tablet (400 mg total) by mouth daily.    metoprolol tartrate (LOPRESSOR) 25 MG tablet Take 1 tablet (25 mg total) by mouth 2 (two) times daily.      CONTINUE these medications which have NOT CHANGED   Details  albuterol (PROVENTIL) (2.5 MG/3ML) 0.083% nebulizer solution Take 2.5 mg by nebulization every 6 (six) hours as needed for wheezing or shortness of breath.    apixaban (ELIQUIS) 5 MG TABS tablet Take 1 tablet (5 mg total) by mouth 2 (two) times daily. Qty: 60 tablet, Refills: 11    furosemide (LASIX) 80 MG tablet Take 80 mg by mouth 2 (two) times daily.    guaiFENesin-dextromethorphan (ROBITUSSIN DM) 100-10 MG/5ML syrup Take 10 mLs by mouth every 6 (six) hours as needed for cough.    ipratropium (ATROVENT) 0.02 % nebulizer solution Inhale 0.2 mg into the lungs every 4 (four) hours as needed for wheezing or shortness of  breath.  Refills: 0    levalbuterol (XOPENEX) 0.63 MG/3ML nebulizer solution Take 3 mLs (0.63 mg total) by nebulization every 4 (four) hours as needed for wheezing or shortness of breath. Qty: 3 mL, Refills: 12    omeprazole (PRILOSEC) 20 MG capsule Take 20 mg by mouth daily.    promethazine (PHENERGAN) 12.5 MG tablet Take 12.5 mg by mouth every 6 (six) hours as needed for nausea or vomiting.    vitamin E 1000 UNIT capsule Take 1,000 Units by mouth daily.    Multiple Vitamins-Minerals (PRESERVISION AREDS) CAPS Take 1 capsule by mouth 2 (two) times daily.      STOP taking these medications     metolazone (ZAROXOLYN) 2.5 MG tablet      potassium chloride SA (  KLOR-CON M20) 20 MEQ tablet      sertraline (ZOLOFT) 25 MG tablet        Allergies  Allergen Reactions  . Codeine     Unknown  . Cucumber Extract Nausea And Vomiting  . Diuretic [Buchu-Cornsilk-Ch Grass-Hydran]     Unknown  . Lipitor [Atorvastatin Calcium]     Unknown  . Penicillins Other (See Comments)    unknown  . Pravachol     Unknown   Follow-up Information    Follow up with Minus Breeding, MD On 02/09/2014.   Specialty:  Cardiology   Why:  at 2:15 pm   Contact information:   [place] [id number] [phone]       Follow up with GREEN, Viviann Spare, MD In 1 week.   Specialty:  Internal Medicine   Contact information:   [place] [id number] [phone]        The results of significant diagnostics from this hospitalization (including imaging, microbiology, ancillary and laboratory) are listed below for reference.    Significant Diagnostic Studies: Dg Chest Port 1 View  01/07/2014   CLINICAL DATA:  Shortness of breath and palpitations. Recent hospitalization for new onset atrial fibrillation. Subsequent encounter.  EXAM: PORTABLE CHEST - 1 VIEW  COMPARISON:  11/17/2013 and 11/10/2013 radiographs; CT 10/11/2013.  FINDINGS: 2255 hr. There is stable  cardiomegaly and aortic atherosclerosis. Small bilateral pleural effusions are present. There are bilateral airspace opacities which are similar to the most recent examination. These have increased from radiographs obtained 3 months ago. These were evaluated by CT in August and appear progressive since then. Left glenohumeral degenerative changes are present. There are no acute osseous findings. Several telemetry leads overlie the chest.  IMPRESSION: Persistent diffuse bilateral airspace opacities and bilateral pleural effusions, similar to most recent examination of 6 weeks ago, but progressive over the last several months. Although there may be a component of edema, the findings are suspicious for an infectious or inflammatory process. Correlate clinically.   Electronically Signed   By: Camie Patience M.D.   On: 01/07/2014 23:11    Microbiology: Recent Results (from the past 240 hour(s))  Culture, blood (routine x 2)     Status: None (Preliminary result)   Collection Time: 01/07/14 11:44 PM  Result Value Ref Range Status   Specimen Description BLOOD RIGHT FOREARM  Final   Special Requests BOTTLES DRAWN AEROBIC AND ANAEROBIC 10CC  Final   Culture  Setup Time   Final    01/08/2014 03:09 Performed at [facility]    Culture   Final           BLOOD CULTURE RECEIVED NO GROWTH TO DATE CULTURE WILL BE HELD FOR 5 DAYS BEFORE ISSUING A FINAL NEGATIVE REPORT Performed at [facility]    Report Status PENDING  Incomplete  Culture, blood (routine x 2)     Status: None (Preliminary result)   Collection Time: 01/07/14 11:50 PM  Result Value Ref Range Status   Specimen Description BLOOD RIGHT ANTECUBITAL  Final   Special Requests BOTTLES DRAWN AEROBIC AND ANAEROBIC 5CC  Final   Culture  Setup Time   Final    01/08/2014 04:21 Performed at [facility]    Culture   Final           BLOOD CULTURE RECEIVED NO GROWTH TO DATE CULTURE WILL BE HELD FOR 5 DAYS BEFORE ISSUING A FINAL  NEGATIVE REPORT Performed at [facility]    Report Status PENDING  Incomplete  Urine culture     Status: None   Collection Time: 01/08/14  4:01 AM  Result Value Ref Range Status   Specimen Description URINE, CATHETERIZED  Final   Special Requests NONE  Final   Culture  Setup Time   Final    01/08/2014 14:53 Performed at [place] Performed at [facility]   Final   Culture NO GROWTH Performed at [facility]   Final   Report Status 01/09/2014 FINAL  Final  MRSA PCR Screening     Status: None   Collection Time: 01/08/14  6:46 AM  Result Value Ref Range Status   MRSA by PCR NEGATIVE NEGATIVE Final    Comment:        The GeneXpert MRSA Assay (FDA approved for NASAL specimens only), is one component of a comprehensive MRSA colonization surveillance program. It is not intended to diagnose MRSA infection nor to guide or monitor treatment for MRSA infections.   Respiratory virus panel     Status: None   Collection Time: 01/08/14  8:40 PM  Result Value Ref Range Status   Source - RVPAN NASOPHARYNGEAL  Final   Respiratory Syncytial Virus A NOT DETECTED  Final   Respiratory Syncytial Virus B NOT DETECTED  Final   Influenza A NOT DETECTED  Final   Influenza B NOT DETECTED  Final   Parainfluenza 1 NOT DETECTED  Final   Parainfluenza 2 NOT DETECTED  Final   Parainfluenza 3 NOT DETECTED  Final   Metapneumovirus NOT DETECTED  Final   Rhinovirus NOT DETECTED  Final   Adenovirus NOT DETECTED  Final   Influenza A H1 NOT DETECTED  Final   Influenza A H3 NOT DETECTED  Final    Comment: (NOTE)       Normal Reference Range for each Analyte: NOT DETECTED Testing performed using the Luminex xTAG Respiratory Viral Panel test kit. The analytical performance characteristics of this assay have been determined by [facility].  The modifications have not been cleared or approved by the FDA. This assay has been validated  pursuant to the CLIA regulations and is used for clinical purposes. Performed at [facility]: Basic Metabolic Panel:  Recent Labs Lab 01/07/14 2246 01/08/14 0420 01/09/14 1054  NA 137 135* 135*  K 5.0 4.5 3.8  CL 94* 92* 93*  CO2 29 24 31   GLUCOSE 108* 113* 97  BUN 17 16 14   CREATININE 0.78 0.74 0.66  CALCIUM 9.0 8.5 8.4   Liver Function Tests:  Recent Labs Lab 01/07/14 2330 01/08/14 0420  AST 44* 43*  ALT 41* 36*  ALKPHOS 91 78  BILITOT 0.5 0.5  PROT 7.6 7.0  ALBUMIN 2.6* 2.2*   No results for input(s): LIPASE, AMYLASE in the last 168 hours. No results for input(s): AMMONIA in the last 168 hours. CBC:  Recent Labs Lab 01/07/14 2246 01/08/14 0420 01/09/14 1054  WBC 11.7* 9.9 9.5  NEUTROABS  --  7.7  --   HGB 10.8* 10.4* 9.7*  HCT 34.1* 33.7* 30.7*  MCV 91.7 92.1 91.9  PLT 485* 381 381   Cardiac Enzymes:  Recent Labs Lab 01/08/14 0420 01/08/14 1034 01/08/14 1442  TROPONINI <0.30 <0.30 <0.30   BNP: BNP (last 3 results)  Recent Labs  11/09/13 0658 11/17/13 0322 01/07/14 2250  PROBNP 1801.0* 1747.0* 4510.0*  CBG: No results for input(s): GLUCAP in the last 168 hours.     SignedEulogio Bear  [facility] 01/11/2014, 12:23 PM

## 2014-01-11 NOTE — Assessment & Plan Note (Signed)
11/05/13 Sertraline 25mg  daily.  12/28/13 dc Sertraline per POA's request-causing the patient's confusion.  12/31/13 stable.  01/04/14 staff reported the patient has decreased appetite and nausea-? Associated Kcl-will have prn Phenergan available to her and continue to monitor the patient.

## 2014-01-11 NOTE — Progress Notes (Signed)
Page by RN due to wet lung sounds Went to eval patient.  Patient c/o a little sob, but was better than earlier today.  1800 dose of lasix 80mg  IV was held due to low BP.  Presently not in any distress.  Denies any cp, n/v.  HR104--RR26--96/62--96% on 5L CV--IRRR Lung--bilateral crackles  abd--soft/+BS Ext--1+LE edema  Clinically still fluid overloaded.  Diuresis limited by BP.  Pt is DNR with plans of d/c back to [facility]  Palliative medicine involved. --may have to tolerated SBP in mid 80s --presently sob, but not in distress --give lasix 40mg  IV x 1 and re-assess in am  DTat

## 2014-01-11 NOTE — Assessment & Plan Note (Addendum)
10/05/13 EKG SNF [facility] Afib-vent rate 100-110.  11/15/13 EET cardioversion 11/30/13 HR 90s. Continued Eliquis 5mg  bid.  12/03/13 EKG vent rate 90s. QT/QTC 384/431 12/21/13 Cardiology decreased Amiodarone 200mg  daily. Dc Metolazone  12/28/13 TEE cardioversion-the patient declined.  01/07/14 Cardiology: increased Metoprolol 75mg  bid and Amiodarone 400mg  daily. Metolazone 2.5mg  daily x 4 days.  01/11/14 continued Metoprolol 50mg  bid, Amiodarone 400mg  x 1week-then 200mg  daily, Lasix 80mg  bid, off Metolazone and Kcl, update CMP and BNP in am. HR 100s at rest.

## 2014-01-11 NOTE — Progress Notes (Signed)
Patient ID: Julie Haas, female   DOB: 1925/10/08, 80 y.o.   MRN: [id number]    Code Status: DNR, living will, comfort measures.    Allergies  Allergen Reactions  . Codeine     Unknown  . Cucumber Extract Nausea And Vomiting  . Diuretic [Buchu-[place]sil[place]-Ch Grass-Hydran]     Unknown  . Lipitor [Atorvastatin Calcium]     Unknown  . Penicillins Other (See Comments)    unknown  . Pravachol     Unknown    Chief Complaint  Patient presents with  . Medical Management of Chronic Issues  . Hospitalization Follow-up    CHF, PNA, comfort measures.     HPI: Patient is a 80 y.o. female seen in the SNF at [facility] today for evaluation of nauseas/decreased appetite and other chronic medical conditions.     Hospitalized 01/07/2014-01/11/2014 Afib RVR, PNA, CHF, HTN, venous stasis dermatitis, Mild aortic stenosis with normal LVF Depression Anemia of chronic disease GERD (gastroesophageal reflux disease) POLST (Physician Orders for Life-Sustaining Treatment)   Presented to ED with SOB-workup demonstrates leukocytosis with WBC 11.7; temperature 100.2; negative troponin; and proBNP 4510. Patient is also found to have A. fib with RVR. Cardizem drip was started.     Hospitalized 11/09/13-11/22/13 Acute respiratory failure with hypoxia HTN (hypertension) Hyperlipidemia Atrial fibrillation with rapid ventricular response Acute diastolic heart failure HCAP (healthcare-associated pneumonia) Lung nodule Hypokalemia Depression Respiratory distress    Hospitalized 10/11/2013-10/20/2013 for HCAP (healthcare-associated pneumonia) Atrial fibrillation Acute diastolic heart failure Hypoxemia Acute respiratory failure with hypoxia.  The patient had recent admission to hospital for PNA s/p TEE/DCCV (discharged on 8/15) and presented to [facility] ED with main concern of progressively worsening shortness of breath, mostly exertional but occasionally present at rest. Associated with productive cough of yellow sputum,  subjective fevers, chills, LE edema and 2 pillow orthopnea      Hospitalized 09/24/2013-10/02/2013 for Acute diastolic heart failure HTN (hypertension) Hyperlipidemia Wound of left leg(since Nov 2014-f/u [place]) Bilateral moderate carotid disease by doppler Atrial fibrillation-conversion 10/01/13 Mild aortic stenosis with normal LVF Pneumonia(Levaquin 750mg  daily) Acute resp failure     Presented ED with SOB. She had a prior history of PVCs but no confirmed AF per her recollection. She was noted in AF with rates in the >150 range. She was given diltiazem en route. In the ED, she was given additional diltiazem and placed on an infusion with better control of her HR in the 110s-120s. She was admitted to hospital for AF-RVR and continue on IV dilt. IV heparin added but later changed to Eliquis. She was given 20mg  of IV lasix for volume overload. She ultimately diuresed -14L net. TEE/DCCV was completed on Aug 14. Fortaz and vancomycin started Monday, Aug 10 for PNA. WBC decreased. She was changed to PO Levaquin 750 mg a day x 5 days(dc 10/07/13)   Problem List Items Addressed This Visit    HTN (hypertension) (Chronic)    Controlled. Takes Metoprolol 25mg  bid and Furosemide 80mg  bid.     HCAP (healthcare-associated pneumonia)    Complete Levaquin at SNF    GERD (gastroesophageal reflux disease)    C/o nausea ? Associated with Kcl-stable-continue Omeprazole 20mg  po daily and prn Phenergan 12.5mg  q6h prn. Observe      Edema (Chronic)    edema BLE 1+ 01/11/14 continue Furosemide 80mg  bid-update CMP and BNP in am.     Depression    11/05/13 Sertraline 25mg  daily.  12/28/13 dc Sertraline per POA's request-causing the  patient's confusion.  12/31/13 stable.  01/04/14 staff reported the patient has decreased appetite and nausea-? Associated Kcl-will have prn Phenergan available to her and continue to monitor the patient.        Atrial fibrillation (Chronic)    10/05/13 EKG SNF [facility] Afib-vent  rate 100-110.  11/15/13 EET cardioversion 11/30/13 HR 90s. Continued Eliquis 5mg  bid.  12/03/13 EKG vent rate 90s. QT/QTC 384/431 12/21/13 Cardiology decreased Amiodarone 200mg  daily. Dc Metolazone  12/28/13 TEE cardioversion-the patient declined.  01/07/14 Cardiology: increased Metoprolol 75mg  bid and Amiodarone 400mg  daily. Metolazone 2.5mg  daily x 4 days.  01/11/14 continued Metoprolol 50mg  bid, Amiodarone 400mg  x 1week-then 200mg  daily, Lasix 80mg  bid, off Metolazone and Kcl, update CMP and BNP in am. HR 100s at rest.      Anemia of chronic disease    11/30/13 Hgb 9.7 12/30/13 Hgb 11.3 01/09/14 Hgb 9.7 01/11/14 update CBC in am.       Adequate anticoagulation on anticoagulant therapy    No bleeding.  Continue eliquis      Acute diastolic heart failure - Primary     No cardioversion now or in the future  No re-hospitalization  If she declines she wants comfort care only  She does not want her life prolonged in any circumstance  She wants to be discharged to [facility] and desires full hospice care.  Continue Lasix, off Metolazone and Kcl, will update CMP and BNP in am.   Morphine 10mg  q2hr prn available to her.          Review of Systems:  Review of Systems  Constitutional: Negative for fever, chills, weight loss, malaise/fatigue and diaphoresis.  HENT: Positive for hearing loss. Negative for congestion, ear discharge, ear pain, nosebleeds, sore throat and tinnitus.   Eyes: Negative for blurred vision, double vision, photophobia, pain, discharge and redness.  Respiratory: Positive for cough and shortness of breath. Negative for hemoptysis, sputum production, wheezing and stridor.        Hacking  Cardiovascular: Positive for leg swelling and PND. Negative for chest pain, palpitations, orthopnea and claudication.       BLE L>R. 1+  Gastrointestinal: Positive for nausea. Negative for heartburn, vomiting, abdominal pain, diarrhea, constipation, blood in stool and  melena.       Associated with Kcl   Genitourinary: Negative for dysuria, urgency, frequency, hematuria and flank pain.  Musculoskeletal: Positive for falls. Negative for myalgias, back pain, joint pain and neck pain.       Assisted to floor when transferring to toilet-no apparent injury.   Skin: Negative for itching and rash.       Left lower leg wound since Nov 2014-healed.   Neurological: Negative for dizziness, tingling, tremors, sensory change, speech change, focal weakness, seizures, loss of consciousness, weakness and headaches.  Endo/Heme/Allergies: Negative for environmental allergies and polydipsia. Does not bruise/bleed easily.  Psychiatric/Behavioral: Positive for depression. Negative for suicidal ideas, hallucinations, memory loss and substance abuse. The patient is nervous/anxious. The patient does not have insomnia.      Past Medical History  Diagnosis Date  . Hyperlipidemia   . Retinal detachment   . Carotid artery stenosis 03/24/2012  . HTN (hypertension) 06/27/2009  . Other generalized ischemic cerebrovascular disease 06/27/2009  . Vitamin D deficiency 05/16/2009  . Family history of sudden cardiac death (SCD) 12/19/08  . Varicose veins of lower extremities with inflammation 03/16/2003  . Atrial fibrillation, persistent   . Chronic diastolic CHF (congestive heart failure)    Past Surgical History  Procedure Laterality Date  . Cholecystectomy  1983  . Cataract extraction Bilateral     Dr. [name]  . Tonsillectomy    . Keratosis    . Retinal detachment surgery Left   . Cardioversion N/A 10/01/2013    Procedure: CARDIOVERSION;  Surgeon: Sanda Klein, MD;  Location: [facility] ENDOSCOPY;  Service: Cardiovascular;  Laterality: N/A;  . Tee without cardioversion N/A 10/01/2013    Procedure: TRANSESOPHAGEAL ECHOCARDIOGRAM (TEE);  Surgeon: Sanda Klein, MD;  Location: [facility] ENDOSCOPY;  Service: Cardiovascular;  Laterality: N/A;  . Tee without cardioversion N/A 11/15/2013     Procedure: TRANSESOPHAGEAL ECHOCARDIOGRAM (TEE);  Surgeon: Fay Records, MD;  Location: [facility] ENDOSCOPY;  Service: Cardiovascular;  Laterality: N/A;   Social History:   reports that she has never smoked. She has never used smokeless tobacco. She reports that she does not drink alcohol or use illicit drugs.  Family History  Problem Relation Age of Onset  . Arrhythmia      Long QT in first degree relatives,  . Heart attack Father   . Heart disease Father   . Heart attack Brother   . Heart disease Brother   . Pneumonia Mother     Medications: Patient's Medications  New Prescriptions   No medications on file  Previous Medications   ALBUTEROL (PROVENTIL) (2.5 MG/3ML) 0.083% NEBULIZER SOLUTION    Take 2.5 mg by nebulization every 6 (six) hours as needed for wheezing or shortness of breath.   AMIODARONE (PACERONE) 400 MG TABLET    Take 1 tablet (400 mg total) by mouth daily.   APIXABAN (ELIQUIS) 5 MG TABS TABLET    Take 1 tablet (5 mg total) by mouth 2 (two) times daily.   FUROSEMIDE (LASIX) 80 MG TABLET    Take 80 mg by mouth 2 (two) times daily.   GUAIFENESIN-DEXTROMETHORPHAN (ROBITUSSIN DM) 100-10 MG/5ML SYRUP    Take 10 mLs by mouth every 6 (six) hours as needed for cough.   IPRATROPIUM (ATROVENT) 0.02 % NEBULIZER SOLUTION    Inhale 0.2 mg into the lungs every 4 (four) hours as needed for wheezing or shortness of breath.    LEVALBUTEROL (XOPENEX) 0.63 MG/3ML NEBULIZER SOLUTION    Take 3 mLs (0.63 mg total) by nebulization every 4 (four) hours as needed for wheezing or shortness of breath.   LEVOFLOXACIN (LEVAQUIN) 750 MG TABLET    Take 1 tablet (750 mg total) by mouth daily.   METOPROLOL TARTRATE (LOPRESSOR) 25 MG TABLET    Take 1 tablet (25 mg total) by mouth 2 (two) times daily.   MORPHINE SULFATE (MORPHINE CONCENTRATE) 10 MG/0.5ML SOLN CONCENTRATED SOLUTION    Take 0.5 mLs (10 mg total) by mouth every 2 (two) hours as needed for moderate pain, severe pain, anxiety or shortness of breath.     OMEPRAZOLE (PRILOSEC) 20 MG CAPSULE    Take 20 mg by mouth daily.   PROMETHAZINE (PHENERGAN) 12.5 MG TABLET    Take 12.5 mg by mouth every 6 (six) hours as needed for nausea or vomiting.  Modified Medications   No medications on file  Discontinued Medications   MULTIPLE VITAMINS-MINERALS (PRESERVISION AREDS) CAPS    Take 1 capsule by mouth 2 (two) times daily.   VITAMIN E 1000 UNIT CAPSULE    Take 1,000 Units by mouth daily.     Physical Exam: Physical Exam  Constitutional:  overwewight  HENT:  Right Ear: External ear normal.  Left Ear: External ear normal.  Nose: Nose normal.  Eyes:  Corrective  lenses.  Neck: Normal range of motion. Neck supple. No JVD present. No tracheal deviation present. No thyromegaly present.  Cardiovascular: Intact distal pulses.  Exam reveals no gallop and no friction rub.   Murmur heard. Varicose leg veins. 2+ edema LLE>RLE. HR 100s irregular. Systolic murmur apex of heart 2-3/6 noted.   Pulmonary/Chest: No respiratory distress. She has no wheezes. She has rales (bronchial rattle).  Moist rales diffused R+L lungs  Abdominal: Soft. Bowel sounds are normal. She exhibits no distension and no mass. There is no tenderness.  Genitourinary:  Foley Cath-the patient desires  Musculoskeletal: Normal range of motion. She exhibits edema. She exhibits no tenderness.  1+edema BLE L>R  Lymphadenopathy:    She has no cervical adenopathy.  Neurological: She is alert. No cranial nerve deficit. Coordination normal.  Skin: No rash noted. No erythema. No pallor.  Open area is about 13mm by 14 mm. Some swelling of the leg above this. Small hematoma at superior end of the injury.  Psychiatric: Her behavior is normal. Judgment and thought content normal.  Depressive and anxious mood.     Filed Vitals:   01/11/14 1446  BP: 100/68  Pulse: 106  Temp: 97.2 F (36.2 C)  TempSrc: Tympanic  Resp: 20  SpO2: 94%      Labs reviewed: Basic Metabolic Panel:  Recent  Labs  09/24/13 0834  10/12/13 0404  11/12/13 0520  01/07/14 2246 01/08/14 0420 01/09/14 1054  NA  --   < > 137  < > 135*  < > 137 135* 135*  K  --   < > 3.5*  < > 2.7*  < > 5.0 4.5 3.8  CL  --   < > 95*  < > 82*  < > 94* 92* 93*  CO2  --   < > 27  < > 42*  < > 29 24 31   GLUCOSE  --   < > 92  < > 88  < > 108* 113* 97  BUN  --   < > 13  < > 19  < > 17 16 14   CREATININE  --   < > 0.60  < > 0.63  < > 0.78 0.74 0.66  CALCIUM  --   < > 7.9*  < > 8.1*  < > 9.0 8.5 8.4  MG  --   --  1.7  --  1.6  --   --   --   --   TSH 2.580  --   --   --   --   --   --   --   --   < > = values in this interval not displayed. Liver Function Tests:  Recent Labs  11/17/13 0322 01/07/14 2330 01/08/14 0420  AST 21 44* 43*  ALT 19 41* 36*  ALKPHOS 70 91 78  BILITOT 0.6 0.5 0.5  PROT 6.9 7.6 7.0  ALBUMIN 2.6* 2.6* 2.2*   No results for input(s): LIPASE, AMYLASE in the last 8760 hours. No results for input(s): AMMONIA in the last 8760 hours. CBC:  Recent Labs  11/18/13 0435 11/19/13 0253  01/07/14 2246 01/08/14 0420 01/09/14 1054  WBC 9.2 10.2  < > 11.7* 9.9 9.5  NEUTROABS 5.9 6.4  --   --  7.7  --   HGB 10.5* 10.9*  < > 10.8* 10.4* 9.7*  HCT 31.8* 31.0*  < > 34.1* 33.7* 30.7*  MCV 90.6 89.6  --  91.7 92.1 91.9  PLT 427*  442*  < > 485* 381 381  < > = values in this interval not displayed. Lipid Panel:  Recent Labs  03/22/13 09/25/13 0321  CHOL 229* 191  HDL 63 86  LDLCALC 148 95  TRIG 92 48  CHOLHDL  --  2.2    Past Procedures:  09/30/13 CXR  IMPRESSION: No active cardiopulmonary disease.  10/01/13   Echocardiogram transesophageal                             LV EF: 55% -   60%  10/11/13 CT chest:  IMPRESSION:  Patchy bilateral airspace process with nodularity most prominent  over the right upper lobe likely due to infection. Patchy  bronchiectatic change as described. Small bilateral pleural  effusions. 1.3 cm pretracheal lymph node likely reactive. Recommend    followup CT in 6-8 weeks to re-evaluate the areas of nodularity.  Cardiomegaly with calcification of the mitral valve annulus and left  atrial enlargement. Atherosclerotic coronary artery disease. Small  amount of pericardial fluid.  1.1 cm liver cyst unchanged.  11/17/13 CXR   IMPRESSION:  Mild bilateral pleural effusions are noted with left greater than  right. Stable bilateral lung opacities are noted consistent with  pneumonia or edema, with right worse than left.   12/30/13 CXR consistent with ongoing congestive heart failure.   01/07/14 CXR  IMPRESSION: Persistent diffuse bilateral airspace opacities and bilateral pleural effusions, similar to most recent examination of 6 weeks ago, but progressive over the last several months. Although there may be a component of edema, the findings are suspicious for an infectious or inflammatory process.     Assessment/Plan Acute diastolic heart failure  No cardioversion now or in the future  No re-hospitalization  If she declines she wants comfort care only  She does not want her life prolonged in any circumstance  She wants to be discharged to [facility] and desires full hospice care.  Continue Lasix, off Metolazone and Kcl, will update CMP and BNP in am.   Morphine 10mg  q2hr prn available to her.     Atrial fibrillation 10/05/13 EKG SNF [facility] Afib-vent rate 100-110.  11/15/13 EET cardioversion 11/30/13 HR 90s. Continued Eliquis 5mg  bid.  12/03/13 EKG vent rate 90s. QT/QTC 384/431 12/21/13 Cardiology decreased Amiodarone 200mg  daily. Dc Metolazone  12/28/13 TEE cardioversion-the patient declined.  01/07/14 Cardiology: increased Metoprolol 75mg  bid and Amiodarone 400mg  daily. Metolazone 2.5mg  daily x 4 days.  01/11/14 continued Metoprolol 50mg  bid, Amiodarone 400mg  x 1week-then 200mg  daily, Lasix 80mg  bid, off Metolazone and Kcl, update CMP and BNP in am. HR 100s at rest.    Adequate anticoagulation on anticoagulant  therapy No bleeding.  Continue eliquis    Anemia of chronic disease 11/30/13 Hgb 9.7 12/30/13 Hgb 11.3 01/09/14 Hgb 9.7 01/11/14 update CBC in am.     Depression 11/05/13 Sertraline 25mg  daily.  12/28/13 dc Sertraline per POA's request-causing the patient's confusion.  12/31/13 stable.  01/04/14 staff reported the patient has decreased appetite and nausea-? Associated Kcl-will have prn Phenergan available to her and continue to monitor the patient.      Edema edema BLE 1+ 01/11/14 continue Furosemide 80mg  bid-update CMP and BNP in am.   HCAP (healthcare-associated pneumonia) Complete Levaquin at SNF  HTN (hypertension) Controlled. Takes Metoprolol 25mg  bid and Furosemide 80mg  bid.   GERD (gastroesophageal reflux disease) C/o nausea ? Associated with Kcl-stable-continue Omeprazole 20mg  po daily and prn Phenergan 12.5mg  q6h prn. Observe  Family/ Staff Communication: observe the patient. Hospice consult.   Goals of Care: SNF  Labs/tests ordered: CBC. CMP, BNP

## 2014-01-12 LAB — BASIC METABOLIC PANEL
BUN: 11 mg/dL (ref 4–21)
CREATININE: 0.7 mg/dL (ref 0.5–1.1)
Glucose: 100 mg/dL
Potassium: 2.8 mmol/L — AB (ref 3.4–5.3)
SODIUM: 137 mmol/L (ref 137–147)

## 2014-01-12 LAB — CBC AND DIFFERENTIAL
HEMATOCRIT: 30 % — AB (ref 36–46)
Hemoglobin: 10 g/dL — AB (ref 12.0–16.0)
Platelets: 514 10*3/uL — AB (ref 150–399)
WBC: 13.6 10^3/mL

## 2014-01-12 LAB — HEPATIC FUNCTION PANEL
ALK PHOS: 88 U/L (ref 25–125)
ALT: 30 U/L (ref 7–35)
AST: 48 U/L — AB (ref 13–35)
Bilirubin, Total: 0.4 mg/dL

## 2014-01-14 LAB — CULTURE, BLOOD (ROUTINE X 2)
CULTURE: NO GROWTH
Culture: NO GROWTH

## 2014-01-17 ENCOUNTER — Telehealth: Payer: Self-pay | Admitting: Cardiology

## 2014-01-17 LAB — BASIC METABOLIC PANEL: Potassium: 4.2 mmol/L (ref 3.4–5.3)

## 2014-01-17 NOTE — Telephone Encounter (Signed)
Pt. Has decided not to move forward with the cardioversion, Dr. Lorrin Goodell came into the home and talked with the pt.

## 2014-01-17 NOTE — Telephone Encounter (Signed)
Julie Haas is calling about [name] to tell you to disregard anything that you and her discussed earlier . Thanks

## 2014-01-17 NOTE — Telephone Encounter (Signed)
I will try and get pt. An appt. This week to talk about a cardioversion

## 2014-01-17 NOTE — Telephone Encounter (Signed)
Julie Haas is calling because [name] now wants to see someone regarding a Cardioversion . Please call ..  thanks

## 2014-01-18 ENCOUNTER — Encounter (HOSPITAL_COMMUNITY): Payer: Self-pay

## 2014-01-18 ENCOUNTER — Inpatient Hospital Stay (HOSPITAL_COMMUNITY)
Admission: EM | Admit: 2014-01-18 | Discharge: 2014-01-20 | DRG: 291 | Disposition: A | Discharge status: Skilled Nursing Facility | Attending: Internal Medicine | Admitting: Internal Medicine

## 2014-01-18 ENCOUNTER — Other Ambulatory Visit: Payer: Self-pay | Admitting: Nurse Practitioner

## 2014-01-18 ENCOUNTER — Emergency Department (HOSPITAL_COMMUNITY)

## 2014-01-18 DIAGNOSIS — E785 Hyperlipidemia, unspecified: Secondary | ICD-10-CM | POA: Diagnosis present

## 2014-01-18 DIAGNOSIS — E559 Vitamin D deficiency, unspecified: Secondary | ICD-10-CM | POA: Diagnosis present

## 2014-01-18 DIAGNOSIS — I5033 Acute on chronic diastolic (congestive) heart failure: Principal | ICD-10-CM | POA: Diagnosis present

## 2014-01-18 DIAGNOSIS — E873 Alkalosis: Secondary | ICD-10-CM | POA: Diagnosis present

## 2014-01-18 DIAGNOSIS — R0602 Shortness of breath: Secondary | ICD-10-CM | POA: Diagnosis not present

## 2014-01-18 DIAGNOSIS — Z885 Allergy status to narcotic agent status: Secondary | ICD-10-CM

## 2014-01-18 DIAGNOSIS — R627 Adult failure to thrive: Secondary | ICD-10-CM | POA: Diagnosis present

## 2014-01-18 DIAGNOSIS — I509 Heart failure, unspecified: Secondary | ICD-10-CM | POA: Diagnosis not present

## 2014-01-18 DIAGNOSIS — I513 Intracardiac thrombosis, not elsewhere classified: Secondary | ICD-10-CM | POA: Diagnosis present

## 2014-01-18 DIAGNOSIS — Z880 Allergy status to penicillin: Secondary | ICD-10-CM

## 2014-01-18 DIAGNOSIS — Z9849 Cataract extraction status, unspecified eye: Secondary | ICD-10-CM | POA: Diagnosis not present

## 2014-01-18 DIAGNOSIS — J189 Pneumonia, unspecified organism: Secondary | ICD-10-CM | POA: Diagnosis present

## 2014-01-18 DIAGNOSIS — D473 Essential (hemorrhagic) thrombocythemia: Secondary | ICD-10-CM | POA: Diagnosis present

## 2014-01-18 DIAGNOSIS — I10 Essential (primary) hypertension: Secondary | ICD-10-CM | POA: Diagnosis present

## 2014-01-18 DIAGNOSIS — Z515 Encounter for palliative care: Secondary | ICD-10-CM

## 2014-01-18 DIAGNOSIS — E876 Hypokalemia: Secondary | ICD-10-CM

## 2014-01-18 DIAGNOSIS — I350 Nonrheumatic aortic (valve) stenosis: Secondary | ICD-10-CM | POA: Diagnosis present

## 2014-01-18 DIAGNOSIS — I679 Cerebrovascular disease, unspecified: Secondary | ICD-10-CM | POA: Diagnosis present

## 2014-01-18 DIAGNOSIS — I959 Hypotension, unspecified: Secondary | ICD-10-CM | POA: Diagnosis present

## 2014-01-18 DIAGNOSIS — Z66 Do not resuscitate: Secondary | ICD-10-CM | POA: Diagnosis present

## 2014-01-18 DIAGNOSIS — R0600 Dyspnea, unspecified: Secondary | ICD-10-CM

## 2014-01-18 DIAGNOSIS — Z888 Allergy status to other drugs, medicaments and biological substances status: Secondary | ICD-10-CM | POA: Diagnosis not present

## 2014-01-18 DIAGNOSIS — Y95 Nosocomial condition: Secondary | ICD-10-CM | POA: Diagnosis present

## 2014-01-18 DIAGNOSIS — J9601 Acute respiratory failure with hypoxia: Secondary | ICD-10-CM | POA: Diagnosis not present

## 2014-01-18 DIAGNOSIS — D649 Anemia, unspecified: Secondary | ICD-10-CM | POA: Diagnosis present

## 2014-01-18 DIAGNOSIS — I481 Persistent atrial fibrillation: Secondary | ICD-10-CM | POA: Diagnosis present

## 2014-01-18 DIAGNOSIS — I4891 Unspecified atrial fibrillation: Secondary | ICD-10-CM

## 2014-01-18 DIAGNOSIS — N179 Acute kidney failure, unspecified: Secondary | ICD-10-CM | POA: Diagnosis present

## 2014-01-18 DIAGNOSIS — I213 ST elevation (STEMI) myocardial infarction of unspecified site: Secondary | ICD-10-CM

## 2014-01-18 DIAGNOSIS — I5030 Unspecified diastolic (congestive) heart failure: Secondary | ICD-10-CM

## 2014-01-18 DIAGNOSIS — D75839 Thrombocytosis, unspecified: Secondary | ICD-10-CM

## 2014-01-18 LAB — BASIC METABOLIC PANEL
Anion gap: 14 (ref 5–15)
BUN: 33 mg/dL — ABNORMAL HIGH (ref 6–23)
CO2: 35 meq/L — AB (ref 19–32)
CREATININE: 1.41 mg/dL — AB (ref 0.50–1.10)
Calcium: 9 mg/dL (ref 8.4–10.5)
Chloride: 85 mEq/L — ABNORMAL LOW (ref 96–112)
GFR calc Af Amer: 38 mL/min — ABNORMAL LOW (ref >90)
GFR calc non Af Amer: 33 mL/min — ABNORMAL LOW (ref >90)
Glucose, Bld: 111 mg/dL — ABNORMAL HIGH (ref 70–99)
Potassium: 5 mEq/L (ref 3.7–5.3)
Sodium: 134 mEq/L — ABNORMAL LOW (ref 137–147)

## 2014-01-18 LAB — CBC WITH DIFFERENTIAL/PLATELET
BASOS PCT: 0 % (ref 0–1)
Basophils Absolute: 0 10*3/uL (ref 0.0–0.1)
EOS ABS: 0 10*3/uL (ref 0.0–0.7)
Eosinophils Relative: 0 % (ref 0–5)
HCT: 33.9 % — ABNORMAL LOW (ref 36.0–46.0)
HEMOGLOBIN: 10.8 g/dL — AB (ref 12.0–15.0)
Lymphocytes Relative: 8 % — ABNORMAL LOW (ref 12–46)
Lymphs Abs: 1.5 10*3/uL (ref 0.7–4.0)
MCH: 28.5 pg (ref 26.0–34.0)
MCHC: 31.9 g/dL (ref 30.0–36.0)
MCV: 89.4 fL (ref 78.0–100.0)
MONO ABS: 1.6 10*3/uL — AB (ref 0.1–1.0)
Monocytes Relative: 9 % (ref 3–12)
NEUTROS PCT: 83 % — AB (ref 43–77)
Neutro Abs: 15.1 10*3/uL — ABNORMAL HIGH (ref 1.7–7.7)
Platelets: 521 10*3/uL — ABNORMAL HIGH (ref 150–400)
RBC: 3.79 MIL/uL — ABNORMAL LOW (ref 3.87–5.11)
RDW: 16.7 % — ABNORMAL HIGH (ref 11.5–15.5)
WBC: 18.1 10*3/uL — ABNORMAL HIGH (ref 4.0–10.5)

## 2014-01-18 LAB — BLOOD GAS, ARTERIAL
Acid-Base Excess: 13.9 mmol/L — ABNORMAL HIGH (ref 0.0–2.0)
Bicarbonate: 38.8 mEq/L — ABNORMAL HIGH (ref 20.0–24.0)
DRAWN BY: 35849
FIO2: 1 %
O2 Saturation: 99.8 %
PH ART: 7.45 (ref 7.350–7.450)
Patient temperature: 98.6
TCO2: 40.5 mmol/L (ref 0–100)
pCO2 arterial: 56.6 mmHg — ABNORMAL HIGH (ref 35.0–45.0)
pO2, Arterial: 172 mmHg — ABNORMAL HIGH (ref 80.0–100.0)

## 2014-01-18 LAB — TROPONIN I: Troponin I: <0.3 ng/mL (ref <0.30)

## 2014-01-18 LAB — MRSA PCR SCREENING: MRSA by PCR: NEGATIVE

## 2014-01-18 LAB — TSH: TSH: 2.55 u[IU]/mL (ref 0.350–4.500)

## 2014-01-18 LAB — PRO B NATRIURETIC PEPTIDE: Pro B Natriuretic peptide (BNP): 5012 pg/mL — ABNORMAL HIGH (ref 0–450)

## 2014-01-18 MED ORDER — FUROSEMIDE 10 MG/ML IJ SOLN
80.0000 mg | Freq: Once | INTRAMUSCULAR | Status: AC
  Administered 2014-01-18: 80 mg via INTRAVENOUS
  Filled 2014-01-18: qty 8

## 2014-01-18 MED ORDER — DILTIAZEM HCL 100 MG IV SOLR
5.0000 mg/h | Freq: Once | INTRAVENOUS | Status: AC
  Administered 2014-01-18: 5 mg/h via INTRAVENOUS

## 2014-01-18 MED ORDER — HYDROCODONE-ACETAMINOPHEN 5-325 MG PO TABS: 1.0000 | ORAL_TABLET | ORAL | Status: DC | PRN

## 2014-01-18 MED ORDER — ACETAMINOPHEN 650 MG RE SUPP: 650.0000 mg | Freq: Four times a day (QID) | RECTAL | Status: DC | PRN

## 2014-01-18 MED ORDER — DILTIAZEM HCL 25 MG/5ML IV SOLN
15.0000 mg | Freq: Once | INTRAVENOUS | Status: AC
  Administered 2014-01-18: 15 mg via INTRAVENOUS
  Filled 2014-01-18: qty 5

## 2014-01-18 MED ORDER — DEXTROSE 5 % IV SOLN
5.0000 mg/h | INTRAVENOUS | Status: DC
  Filled 2014-01-18: qty 100

## 2014-01-18 MED ORDER — SODIUM CHLORIDE 0.9 % IJ SOLN
3.0000 mL | Freq: Two times a day (BID) | INTRAMUSCULAR | Status: DC
  Administered 2014-01-19 (×2): 3 mL via INTRAVENOUS

## 2014-01-18 MED ORDER — DIGOXIN 0.25 MG/ML IJ SOLN
0.1250 mg | Freq: Once | INTRAMUSCULAR | Status: AC
  Administered 2014-01-18: 0.125 mg via INTRAVENOUS
  Filled 2014-01-18 (×2): qty 0.5

## 2014-01-18 MED ORDER — MORPHINE SULFATE (CONCENTRATE) 10 MG/0.5ML PO SOLN
5.0000 mg | ORAL | Status: DC | PRN
  Administered 2014-01-18: 5 mg via ORAL
  Filled 2014-01-18: qty 0.5

## 2014-01-18 MED ORDER — MORPHINE SULFATE 2 MG/ML IJ SOLN
1.0000 mg | INTRAMUSCULAR | Status: DC | PRN
  Administered 2014-01-18 – 2014-01-19 (×4): 1 mg via INTRAVENOUS
  Filled 2014-01-18 (×5): qty 1

## 2014-01-18 MED ORDER — AMIODARONE HCL 200 MG PO TABS
400.0000 mg | ORAL_TABLET | Freq: Every day | ORAL | Status: DC
  Administered 2014-01-18 – 2014-01-19 (×2): 400 mg via ORAL
  Filled 2014-01-18 (×2): qty 2

## 2014-01-18 MED ORDER — ACETAMINOPHEN 325 MG PO TABS: 650.0000 mg | ORAL_TABLET | Freq: Four times a day (QID) | ORAL | Status: DC | PRN

## 2014-01-18 MED ORDER — APIXABAN 5 MG PO TABS
5.0000 mg | ORAL_TABLET | Freq: Two times a day (BID) | ORAL | Status: DC
  Administered 2014-01-18 – 2014-01-19 (×3): 5 mg via ORAL
  Filled 2014-01-18 (×3): qty 1

## 2014-01-18 MED ORDER — METOPROLOL TARTRATE 25 MG PO TABS
25.0000 mg | ORAL_TABLET | Freq: Two times a day (BID) | ORAL | Status: DC
  Administered 2014-01-19 (×2): 25 mg via ORAL
  Filled 2014-01-18 (×2): qty 1

## 2014-01-18 MED ORDER — POLYETHYLENE GLYCOL 3350 17 G PO PACK: 17.0000 g | PACK | Freq: Every day | ORAL | Status: DC

## 2014-01-18 MED ORDER — FUROSEMIDE 10 MG/ML IJ SOLN
60.0000 mg | Freq: Two times a day (BID) | INTRAMUSCULAR | Status: DC
  Administered 2014-01-18 – 2014-01-19 (×3): 60 mg via INTRAVENOUS
  Filled 2014-01-18 (×4): qty 6

## 2014-01-18 NOTE — ED Notes (Signed)
Per EDP stop cardizem drip for 10 min until BP improves.

## 2014-01-18 NOTE — Care Management Note (Signed)
    Page 1 of 1   01/20/2014     12:45:24 PM CARE MANAGEMENT NOTE 01/20/2014  Patient:  Julie Haas, Julie Haas   Account Number:  [id number]  Date Initiated:  01/18/2014  Documentation initiated by:  GRAVES-BIGELOW,Larrie Fraizer  Subjective/Objective Assessment:   Pt admitted for worsening SOB and afib. Initiated on cardizem gtt, however bp too low and stopped. Pt is from [facility] with Hospice Care.     Action/Plan:   CM will continue ot monitor.   Anticipated DC Date:  01/19/2014   Anticipated DC Plan:  [place]  CM consult      Choice offered to / List presented to:             Status of service:  In process, will continue to follow Medicare Important Message given?  NA - LOS <3 / Initial given by admissions (If response is "NO", the following Medicare IM given date fields will be blank) Date Medicare IM given:   Medicare IM given by:   Date Additional Medicare IM given:   Additional Medicare IM given by:    Discharge Disposition:  [place]  Per UR Regulation:  Reviewed for med. necessity/level of care/duration of stay  If discussed at [place] of Stay Meetings, dates discussed:    Comments:  Pt returned to [place] with Hospice care. Jacqlyn Krauss, RN,BSN [phone]

## 2014-01-18 NOTE — ED Notes (Signed)
Pt comes friends home via [facility], c/o SOB , staff tried to console, pt refused meds at nursing home. Pt requesting to come to hospital

## 2014-01-18 NOTE — H&P (Signed)
[facility] History and Physical  Julie Haas WUJ:[id number] DOB: [birth year] DOA: 01/18/2014  Referring physician: EDP PCP: Estill Dooms, MD   Chief Complaint: SOB  HPI: Julie Haas is a 80 y.o. female with past medical history of A. fib with RVR, chronic diastolic CHF and atrial thrombus on Eliquis anticoagulation. Patient was recently in the hospital discharged on 11/24 with intention of full comfort, she was brought back to the hospital for SOB and respiratory distress. Per family since discharge she was progressively deteriorating, 1 day PTA she was seen by the hospice MD and her oral morphine dose was changed. In the AM of admission she was very SOB and she asked nursing staff to send her to hospital. In the ED she was found to have A fib with RVR, acute on chronic CHF , ARF and respiratory failure. She was admitted to the hospital for further evaluation.  Review of Systems:  Unobtainable, pt was in respiratory distress and was not able to speak   Past Medical History  Diagnosis Date  . Hyperlipidemia   . Retinal detachment   . Carotid artery stenosis 03/24/2012  . HTN (hypertension) 06/27/2009  . Other generalized ischemic cerebrovascular disease 06/27/2009  . Vitamin D deficiency 05/16/2009  . Family history of sudden cardiac death (SCD) 12/08/2008  . Varicose veins of lower extremities with inflammation 03/16/2003  . Atrial fibrillation, persistent   . Chronic diastolic CHF (congestive heart failure)    Past Surgical History  Procedure Laterality Date  . Cholecystectomy  1983  . Cataract extraction Bilateral     Dr. [name]  . Tonsillectomy    . Keratosis    . Retinal detachment surgery Left   . Cardioversion N/A 10/01/2013    Procedure: CARDIOVERSION;  Surgeon: Sanda Klein, MD;  Location: [facility] ENDOSCOPY;  Service: Cardiovascular;  Laterality: N/A;  . Tee without cardioversion N/A 10/01/2013    Procedure: TRANSESOPHAGEAL ECHOCARDIOGRAM (TEE);  Surgeon:  Sanda Klein, MD;  Location: [facility] ENDOSCOPY;  Service: Cardiovascular;  Laterality: N/A;  . Tee without cardioversion N/A 11/15/2013    Procedure: TRANSESOPHAGEAL ECHOCARDIOGRAM (TEE);  Surgeon: Fay Records, MD;  Location: [facility] ENDOSCOPY;  Service: Cardiovascular;  Laterality: N/A;   Social History:   reports that she has never smoked. She has never used smokeless tobacco. She reports that she does not drink alcohol or use illicit drugs.  Allergies  Allergen Reactions  . Codeine     Unknown  . Cucumber Extract Nausea And Vomiting  . Diuretic [Buchu-Cornsilk-Ch Grass-Hydran]     Unknown  . Lipitor [Atorvastatin Calcium]     Unknown  . Penicillins Other (See Comments)    unknown  . Pravachol     Unknown    Family History  Problem Relation Age of Onset  . Arrhythmia      Long QT in first degree relatives,  . Heart attack Father   . Heart disease Father   . Heart attack Brother   . Heart disease Brother   . Pneumonia Mother      Prior to Admission medications   Medication Sig Start Date End Date Taking? Authorizing Provider  amiodarone (PACERONE) 400 MG tablet Take 1 tablet (400 mg total) by mouth daily. 01/11/14  Yes Geradine Girt, DO  apixaban (ELIQUIS) 5 MG TABS tablet Take 1 tablet (5 mg total) by mouth 2 (two) times daily. 10/02/13  Yes [name], PA-C  furosemide (LASIX) 80 MG tablet Take 80 mg by mouth 2 (two) times  daily.   Yes Historical Provider, MD  guaiFENesin-dextromethorphan (ROBITUSSIN DM) 100-10 MG/5ML syrup Take 10 mLs by mouth every 6 (six) hours as needed for cough.   Yes Historical Provider, MD  levalbuterol (XOPENEX) 0.63 MG/3ML nebulizer solution Take 3 mLs (0.63 mg total) by nebulization every 4 (four) hours as needed for wheezing or shortness of breath. 10/20/13  Yes Geradine Girt, DO  metoprolol tartrate (LOPRESSOR) 25 MG tablet Take 1 tablet (25 mg total) by mouth 2 (two) times daily. 01/11/14  Yes Geradine Girt, DO  Morphine Sulfate (MORPHINE  CONCENTRATE) 10 MG/0.5ML SOLN concentrated solution Take 0.5 mLs (10 mg total) by mouth every 2 (two) hours as needed for moderate pain, severe pain, anxiety or shortness of breath. 01/11/14  Yes Geradine Girt, DO  omeprazole (PRILOSEC) 20 MG capsule Take 20 mg by mouth daily.   Yes Historical Provider, MD  OXYGEN Inhale 5 L into the lungs continuous.   Yes Historical Provider, MD  polyethylene glycol (MIRALAX / GLYCOLAX) packet Take 17 g by mouth daily.   Yes Historical Provider, MD  potassium chloride SA (K-DUR,KLOR-CON) 20 MEQ tablet Take 40 mEq by mouth daily.   Yes Historical Provider, MD  promethazine (PHENERGAN) 12.5 MG tablet Take 12.5 mg by mouth every 6 (six) hours as needed for nausea or vomiting.   Yes Historical Provider, MD  senna (SENOKOT) 8.6 MG tablet Take 2 tablets by mouth 2 (two) times daily.   Yes Historical Provider, MD  albuterol (PROVENTIL) (2.5 MG/3ML) 0.083% nebulizer solution Take 2.5 mg by nebulization every 6 (six) hours as needed for wheezing or shortness of breath.    Historical Provider, MD  ipratropium (ATROVENT) 0.02 % nebulizer solution Inhale 0.2 mg into the lungs every 4 (four) hours as needed for wheezing or shortness of breath.  11/23/13   Historical Provider, MD  levofloxacin (LEVAQUIN) 750 MG tablet Take 1 tablet (750 mg total) by mouth daily. 01/11/14   Geradine Girt, DO   Physical Exam: Filed Vitals:   01/18/14 0815  BP: 105/63  Pulse: 126  Temp:   Resp: 21   Constitutional: Alert in moderate to severe respiratory distress  Head: Normocephalic and atraumatic.  Nose: Nose normal.  Mouth/Throat: Uvula is midline, oropharynx is clear and moist and mucous membranes are normal.  Eyes: Conjunctivae and EOM are normal. Pupils are equal, round, and reactive to light.  Neck: Trachea normal and normal range of motion. Neck supple.  Cardiovascular: Normal rate, regular rhythm, S1 normal, S2 normal, normal heart sounds and intact distal pulses.     Pulmonary/Chest: bilateral crackles, labored breathing Abdominal: Soft. Bowel sounds are normal. There is no hepatosplenomegaly. There is no tenderness.  Musculoskeletal: Normal range of motion.  Neurological: Alert and oriented to person, place, and time. Has normal strength. No cranial nerve deficit or sensory deficit.  Skin: Skin is warm, dry and intact.  Psychiatric: Has a normal mood and affect. Speech is normal and behavior is normal.   Labs on Admission:  Basic Metabolic Panel:  Recent Labs Lab 01/18/14 0207  NA 134*  K 5.0  CL 85*  CO2 35*  GLUCOSE 111*  BUN 33*  CREATININE 1.41*  CALCIUM 9.0   Liver Function Tests: No results for input(s): AST, ALT, ALKPHOS, BILITOT, PROT, ALBUMIN in the last 168 hours. No results for input(s): LIPASE, AMYLASE in the last 168 hours. No results for input(s): AMMONIA in the last 168 hours. CBC:  Recent Labs Lab 01/18/14 0207  WBC  18.1*  NEUTROABS 15.1*  HGB 10.8*  HCT 33.9*  MCV 89.4  PLT 521*   Cardiac Enzymes:  Recent Labs Lab 01/18/14 0207  TROPONINI <0.30    BNP (last 3 results)  Recent Labs  11/17/13 0322 01/07/14 2250 01/18/14 0207  PROBNP 1747.0* 4510.0* 5012.0*   CBG: No results for input(s): GLUCAP in the last 168 hours.  Radiological Exams on Admission: Dg Chest Port 1 View  01/18/2014   CLINICAL DATA:  Shortness of breath.  Initial encounter.  EXAM: PORTABLE CHEST - 1 VIEW  COMPARISON:  01/07/2014  FINDINGS: Cardiomegaly with bilateral airspace opacities again noted.  Increasing a left lower lung consolidation/atelectasis noted.  Bilateral pleural effusions are again identified, likely increased on the left.  There is no evidence of pneumothorax.  IMPRESSION: Increased left lower lung consolidations/atelectasis.  Unchanged bilateral airspace opacities, cardiomegaly and small right pleural effusion. Question increased left pleural effusion.   Electronically Signed   By: Hassan Rowan M.D.   On: 01/18/2014  02:26    EKG: Independently reviewed.   Assessment/Plan Principal Problem:   Acute respiratory failure with hypoxia Active Problems:   HTN (hypertension)   Atrial thrombus on TEE 11/15/13   Atrial fibrillation with RVR   Acute exacerbation of congestive heart failure   Atrial fibrillation with rapid ventricular response       Acute respiratory failure with hypoxia Admit to step down, d/W family at bed side, DNR/DNI. She is actively in hospice program, [facility] [place] to be notified. Provide oxygen, respiratory failure is likely partly if not entirely caused by CHF.    Acute exacerbation of congestive heart failure Presented with fluid overload, started on IV Lasix. Continue home medications. Cardiology consulted.    HTN (hypertension) Presented with reasonable BP of 105/60, dropped after diuretics and antiarrythmic started. Monitor closely.    Atrial thrombus on TEE 11/15/13 On Anticoagulation with Eliquis.    Atrial fibrillation with RVR Pt and family mentioned cardioversion more than one time for her A fib. I explained to them cardioversion is the least of her concerns right now, if her BP low cardioversion will be considered a resuscitation. Currently BP is low, RVR, instable hemodynamically can not use other antiarrythmic b/c of the low BP  Cardiology consulted.    End-of-life discussion Explained to the son and daughter at bedside that her condition is critical. Confirmed that she is DNR/DNI. Their goal is stabilize her and to send her back the SNF.  Code Status: DNR/DNI confirmed with Daughter and son of bedside. Family Communication: plan discussed with the patient in the presence of daughter and son at bedside. Disposition Plan: SDU  Time spent: 70 minutes  [facility] Pager [phone]

## 2014-01-18 NOTE — ED Notes (Signed)
EDP made aware of pt's current BP and heart rate, ordered to leave cardizem drip off until BP increases.

## 2014-01-18 NOTE — Progress Notes (Signed)
Inpatient RN visit- Jaquia Benedicto [facility] 3W Room 24 -[facility] RN Visit-Karen Alford [place] RN Related admission to [facility] diagnosis of Heart failure.  Pt is DNR code.   Pt at seen bedside, lying supine, eyes closed, nonrebreather mask in place. Staff RN Larene Beach present in room. Pt appears to have increased work of breathing, Larene Beach reports pt had just recvd IV morphine 1mg , BP remains low at 78/51. Discussed need for liquid morphine for symptom management, as patient had been recvg this on a scheduled basis prior to this hospital admission. Writer spoke with attending physician Dr. Hartford Poli, who was in agreement with liquid morphine 5mg  po/sl Q 2 hrs PRN and will place order. Pt cleaned and repositioned, with staff RN, nonrebreather changed to nasal cannula @ 4L. Oxygen saturation remained between 90-93%. Pt was minimally responsive, was able to deny pain and assist weakly with turns during cleaning. Mouth care provided, patient tolerated well. Patient's son Winona Legato in later and Probation officer returned to room to speak with him. He voiced that he and his sister Rod Holler want thiermother to be comfortable and are in agreement with the use of the liquid morphine for management of dyspnea/pain. Writer also discussed the possible need for antianxiety medication if this symptom arises, [place] voiced agreement with that as well.  [facility] will continue to follow daily and collaborate with hospital staff and attending physician regarding symptom management and patient/family needs.  Patient's home medication list and OOF DNR, MOST form in place on shadow chart.  Please call [facility] @ [phone]-with any hospice needs.   Thank you. Tracey Harries, RN, BSN, [facility] Liaison  [phone])

## 2014-01-18 NOTE — Progress Notes (Signed)
Switched patient to partial nonrebreather mask. Pt current sats 100% on full nonrebreather. Will continue to monitor.

## 2014-01-18 NOTE — Progress Notes (Signed)
Pt lethargic, BP 83/52, HR 120's, Pt on nonrebreather sats 100%. Dr. Mare Ferrari notified new orders received. Will continue to monitor patient.

## 2014-01-18 NOTE — ED Notes (Signed)
Foley Cath Placed by Toni Arthurs, RN

## 2014-01-18 NOTE — Consult Note (Signed)
CARDIOLOGY CONSULT NOTE   Patient ID: Julie Haas MRN: [id number], DOB/AGE: 1925/09/15   Admit date: 01/18/2014 Date of Consult: 01/18/2014   Primary Physician: Estill Dooms, MD Primary Cardiologist: Dr. Percival Spanish  Pt. Profile  80 year old woman with atrial fibrillation with rapid ventricular response.  Patient is on hospice.  Problem List  Past Medical History  Diagnosis Date  . Hyperlipidemia   . Retinal detachment   . Carotid artery stenosis 03/24/2012  . HTN (hypertension) 06/27/2009  . Other generalized ischemic cerebrovascular disease 06/27/2009  . Vitamin D deficiency 05/16/2009  . Family history of sudden cardiac death (SCD) 12-14-2008  . Varicose veins of lower extremities with inflammation 03/16/2003  . Atrial fibrillation, persistent   . Chronic diastolic CHF (congestive heart failure)     Past Surgical History  Procedure Laterality Date  . Cholecystectomy  1983  . Cataract extraction Bilateral     Dr. [name]  . Tonsillectomy    . Keratosis    . Retinal detachment surgery Left   . Cardioversion N/A 10/01/2013    Procedure: CARDIOVERSION;  Surgeon: Sanda Klein, MD;  Location: [facility] ENDOSCOPY;  Service: Cardiovascular;  Laterality: N/A;  . Tee without cardioversion N/A 10/01/2013    Procedure: TRANSESOPHAGEAL ECHOCARDIOGRAM (TEE);  Surgeon: Sanda Klein, MD;  Location: [facility] ENDOSCOPY;  Service: Cardiovascular;  Laterality: N/A;  . Tee without cardioversion N/A 11/15/2013    Procedure: TRANSESOPHAGEAL ECHOCARDIOGRAM (TEE);  Surgeon: Fay Records, MD;  Location: [facility] ENDOSCOPY;  Service: Cardiovascular;  Laterality: N/A;     Allergies  Allergies  Allergen Reactions  . Codeine     Unknown  . Cucumber Extract Nausea And Vomiting  . Diuretic [Buchu-Cornsilk-Ch Grass-Hydran]     Unknown  . Lipitor [Atorvastatin Calcium]     Unknown  . Penicillins Other (See Comments)    unknown  . Pravachol     Unknown    HPI   This 80 year old woman comes to the  emergency room last night with worsening dyspnea.  She has a history of atrial fibrillation.  She has been ambivalent as to whether she once to proceed with cardioversion or not.  She is a resident of [facility] [place].  She has had multiple admissions to the hospital since August 2015 for exacerbations of chronic diastolic heart failure and atrial fibrillation with rapid ventricular response.  She was last hospitalized on 01/07/14 and discharged on 01/11/14.  At the time of her discharge the following was noted:  No cardioversion now or in the future  No re-hospitalization  If she declines she wants comfort care only  She does not want her life prolonged in any circumstance  She wants to be discharged to [facility] and desires full hospice care.  Last night her breathing became worse.  She was more short of breath.  She then told her family that she would be willing to undergo cardioversion.  She has been on Apixaban for a satisfactory period of time.  She was initially cardioverted on 10/01/13 successfully however reverted back to atrial fibrillation on 10/05/13.She was admitted with respiratory failure and acute CHF on 11/09/13. She was hospitalized till 11/22/13. TEE during that admission showed LA smoke. Amiodarone was added with plans for eventual cardioversion. She was seen by Cecilie Kicks 12/06/13 and noted to be volume overloaded. Her wgt was 203. Metolazone 2.5 mg Tues/ Friday was added. She was seen by Kerin Ransom on 12/21/13 and was doing better. Weight was down to  173 lbs. Amiodarone dose was reduced to 400 mg daily. She was scheduled for a DCCV but subsequently refused to have it done. Since then amiodarone was reduced further to 200 mg daily. Metolazone was stopped.  She was seen in the office by Dr. Martinique on 01/07/14 prior to her last hospitalization and at that point her amiodarone dose was increased to 400 mg daily. Inpatient Medications    Family  History Family History  Problem Relation Age of Onset  . Arrhythmia      Long QT in first degree relatives,  . Heart attack Father   . Heart disease Father   . Heart attack Brother   . Heart disease Brother   . Pneumonia Mother      Social History History   Social History  . Marital Status: Widowed    Spouse Name: N/A    Number of Children: N/A  . Years of Education: N/A   Occupational History  . Housewife    Social History Main Topics  . Smoking status: Never Smoker   . Smokeless tobacco: Never Used  . Alcohol Use: No  . Drug Use: No  . Sexual Activity: No   Other Topics Concern  . Not on file   Social History Narrative   The patient is widowed, husband died March 06, 2011.  She has two children and two     grandchildren.     She never smoked cigarettes    Does not drink alcohol.    Drinks 2 cups caffeine coffee,    L[facility] at [facility] since 09/03/2004   Living Will   Exercise: walks daily           Review of Systems  General:  No chills, fever, night sweats or weight changes.  Cardiovascular:  No chest pain, dyspnea on exertion, edema, orthopnea, palpitations, paroxysmal nocturnal dyspnea. Dermatological: No rash, lesions/masses Respiratory: Positive for cough with sputum Urologic: No hematuria, dysuria Abdominal:   No nausea, vomiting, diarrhea, bright red blood per rectum, melena, or hematemesis Neurologic:  No visual changes, wkns, changes in mental status. All other systems reviewed and are otherwise negative except as noted above.  Physical Exam  Blood pressure 97/53, pulse 126, temperature 98 F (36.7 C), temperature source Oral, resp. rate 22, height 5\' 4"  (1.626 m), weight 185 lb (83.915 kg), SpO2 100 %.  General: Pleasant, NAD.  On oxygen by mask Psych: Normal affect. Neuro: Alert and oriented X 3. Moves all extremities spontaneously. HEENT: Normal  Neck: Supple without bruits or JVD. Lungs:  Prominent rales and rhonchi bilaterally Heart:  Rapid irregular heart rate, no s3, s4, or murmurs. Abdomen: Soft, non-tender, non-distended, BS + x 4.  Extremities: Bilateral pretibial and pedal edema.  There is erythema of both lower legs.  Labs   Recent Labs  01/18/14 0207  TROPONINI <0.30   Lab Results  Component Value Date   WBC 18.1* 01/18/2014   HGB 10.8* 01/18/2014   HCT 33.9* 01/18/2014   MCV 89.4 01/18/2014   PLT 521* 01/18/2014     Recent Labs Lab 01/18/14 0207  NA 134*  K 5.0  CL 85*  CO2 35*  BUN 33*  CREATININE 1.41*  CALCIUM 9.0  GLUCOSE 111*   Lab Results  Component Value Date   CHOL 191 09/25/2013   HDL 86 09/25/2013   LDLCALC 95 09/25/2013   TRIG 48 09/25/2013   No results found for: [facility]  Radiology/Studies  Dg Chest Port 1 View  01/18/2014  CLINICAL DATA:  Shortness of breath.  Initial encounter.  EXAM: PORTABLE CHEST - 1 VIEW  COMPARISON:  01/07/2014  FINDINGS: Cardiomegaly with bilateral airspace opacities again noted.  Increasing a left lower lung consolidation/atelectasis noted.  Bilateral pleural effusions are again identified, likely increased on the left.  There is no evidence of pneumothorax.  IMPRESSION: Increased left lower lung consolidations/atelectasis.  Unchanged bilateral airspace opacities, cardiomegaly and small right pleural effusion. Question increased left pleural effusion.   Electronically Signed   By: Hassan Rowan M.D.   On: 01/18/2014 02:26   Dg Chest Port 1 View  01/07/2014   CLINICAL DATA:  Shortness of breath and palpitations. Recent hospitalization for new onset atrial fibrillation. Subsequent encounter.  EXAM: PORTABLE CHEST - 1 VIEW  COMPARISON:  11/17/2013 and 11/10/2013 radiographs; CT 10/11/2013.  FINDINGS: 2255 hr. There is stable cardiomegaly and aortic atherosclerosis. Small bilateral pleural effusions are present. There are bilateral airspace opacities which are similar to the most recent examination. These have increased from radiographs obtained 3 months ago.  These were evaluated by CT in August and appear progressive since then. Left glenohumeral degenerative changes are present. There are no acute osseous findings. Several telemetry leads overlie the chest.  IMPRESSION: Persistent diffuse bilateral airspace opacities and bilateral pleural effusions, similar to most recent examination of 6 weeks ago, but progressive over the last several months. Although there may be a component of edema, the findings are suspicious for an infectious or inflammatory process. Correlate clinically.   Electronically Signed   By: Camie Patience M.D.   On: 01/07/2014 23:11    ECG  Atrial fibrillation with rapid ventricular response Nonspecific intraventricular conduction delay Repol abnrm suggests ischemia, lateral leads Low voltage QRS When compared with ECG of 01/10/2014, HEART RATE has increased Confirmed by [facility] MD, DAVID ([id number]) on 01/18/2014 2:06:08 AM  ASSESSMENT AND PLAN  1.  Atrial fibrillation with rapid ventricular response.  She has been adequately anticoagulated with Apixaban 2.  Acute on chronic diastolic heart failure 3.  Mild renal insufficiency 4.  Probable left lower lobe pneumonia with leukocytosis 5.  Low blood pressure 6.  Presently a hospice patient  Disposition: The patient has been ambivalent previously as to whether she wants to proceed with direct current cardioversion or not.  At the present time she has evidence of worsening pneumonia with leukocytosis and white count 18,000 which is exacerbating her rapid heart rate response to her atrial fib.  She has evidence of worsening CHF by chest x-ray.  In the past she has been able to be cardioverted in August 2015 but reverted to atrial fib after only 4 days.  However now she has been preloaded with amiodarone.  I would suggest aggressive treatment of her underlying pneumonia and treatment of her acute on chronic exacerbation of diastolic heart failure with IV Lasix to try to optimize her cardiac  status and then proceed with direct-current cardioversion once her pneumonia has been stabilized.  Continue amiodarone, Apixaban, and beta blocker. Will follow with you  Signed, Darlin Coco, MD  01/18/2014, 8:14 AM

## 2014-01-18 NOTE — ED Provider Notes (Signed)
CSN: [id number]     Arrival date & time 01/18/14  0149 History  This chart was scribed for Julie Fuel, MD by Starleen Arms, ED Scribe. This patient was seen in room A06C/A06C and the patient's care was started at 1:54 AM.   No chief complaint on file.  The history is provided by the patient. No language interpreter was used.  HPI Comments: Julie Haas is a 80 y.o. female brought in by ambulance, who presents to the Emergency Department complaining of difficulty breathing with an associated cough productive of clear mucous onset several hours ago.  Patient denies CP, chest heaviness/tightness/pressure/palpitations.    Past Medical History  Diagnosis Date  . Hyperlipidemia   . Retinal detachment   . Carotid artery stenosis 03/24/2012  . HTN (hypertension) 06/27/2009  . Other generalized ischemic cerebrovascular disease 06/27/2009  . Vitamin D deficiency 05/16/2009  . Family history of sudden cardiac death (SCD) 2008/12/23  . Varicose veins of lower extremities with inflammation 03/16/2003  . Atrial fibrillation, persistent   . Chronic diastolic CHF (congestive heart failure)    Past Surgical History  Procedure Laterality Date  . Cholecystectomy  1983  . Cataract extraction Bilateral     Dr. [name]  . Tonsillectomy    . Keratosis    . Retinal detachment surgery Left   . Cardioversion N/A 10/01/2013    Procedure: CARDIOVERSION;  Surgeon: Sanda Klein, MD;  Location: [facility] ENDOSCOPY;  Service: Cardiovascular;  Laterality: N/A;  . Tee without cardioversion N/A 10/01/2013    Procedure: TRANSESOPHAGEAL ECHOCARDIOGRAM (TEE);  Surgeon: Sanda Klein, MD;  Location: [facility] ENDOSCOPY;  Service: Cardiovascular;  Laterality: N/A;  . Tee without cardioversion N/A 11/15/2013    Procedure: TRANSESOPHAGEAL ECHOCARDIOGRAM (TEE);  Surgeon: Fay Records, MD;  Location: [facility] ENDOSCOPY;  Service: Cardiovascular;  Laterality: N/A;   Family History  Problem Relation Age of Onset  . Arrhythmia      Long QT in  first degree relatives,  . Heart attack Father   . Heart disease Father   . Heart attack Brother   . Heart disease Brother   . Pneumonia Mother    History  Substance Use Topics  . Smoking status: Never Smoker   . Smokeless tobacco: Never Used  . Alcohol Use: No   OB History    No data available     Review of Systems  All other systems reviewed and are negative.     Allergies  Codeine; Cucumber extract; Diuretic; Lipitor; Penicillins; and Pravachol  Home Medications   Prior to Admission medications   Medication Sig Start Date End Date Taking? Authorizing Provider  albuterol (PROVENTIL) (2.5 MG/3ML) 0.083% nebulizer solution Take 2.5 mg by nebulization every 6 (six) hours as needed for wheezing or shortness of breath.    Historical Provider, MD  amiodarone (PACERONE) 400 MG tablet Take 1 tablet (400 mg total) by mouth daily. 01/11/14   Geradine Girt, DO  apixaban (ELIQUIS) 5 MG TABS tablet Take 1 tablet (5 mg total) by mouth 2 (two) times daily. 10/02/13   [name], PA-C  furosemide (LASIX) 80 MG tablet Take 80 mg by mouth 2 (two) times daily.    Historical Provider, MD  guaiFENesin-dextromethorphan (ROBITUSSIN DM) 100-10 MG/5ML syrup Take 10 mLs by mouth every 6 (six) hours as needed for cough.    Historical Provider, MD  ipratropium (ATROVENT) 0.02 % nebulizer solution Inhale 0.2 mg into the lungs every 4 (four) hours as needed for wheezing or shortness of breath.  11/23/13   Historical Provider, MD  levalbuterol Penne Lash) 0.63 MG/3ML nebulizer solution Take 3 mLs (0.63 mg total) by nebulization every 4 (four) hours as needed for wheezing or shortness of breath. 10/20/13   Geradine Girt, DO  levofloxacin (LEVAQUIN) 750 MG tablet Take 1 tablet (750 mg total) by mouth daily. 01/11/14   Geradine Girt, DO  metoprolol tartrate (LOPRESSOR) 25 MG tablet Take 1 tablet (25 mg total) by mouth 2 (two) times daily. 01/11/14   Geradine Girt, DO  Morphine Sulfate (MORPHINE CONCENTRATE)  10 MG/0.5ML SOLN concentrated solution Take 0.5 mLs (10 mg total) by mouth every 2 (two) hours as needed for moderate pain, severe pain, anxiety or shortness of breath. 01/11/14   Geradine Girt, DO  omeprazole (PRILOSEC) 20 MG capsule Take 20 mg by mouth daily.    Historical Provider, MD  promethazine (PHENERGAN) 12.5 MG tablet Take 12.5 mg by mouth every 6 (six) hours as needed for nausea or vomiting.    Historical Provider, MD   BP 92/63 mmHg  Pulse 120  Temp(Src) 98 F (36.7 C) (Oral)  Resp 18  Ht 5\' 4"  (1.626 m)  Wt 185 lb (83.915 kg)  BMI 31.74 kg/m2  SpO2 100% Physical Exam  Constitutional: She is oriented to person, place, and time. She appears well-developed and well-nourished. No distress.  HENT:  Head: Normocephalic and atraumatic.  Eyes: Conjunctivae and EOM are normal. Pupils are equal, round, and reactive to light.  Neck: Normal range of motion. Neck supple. No JVD present. No tracheal deviation present.  Cardiovascular:  No murmur heard. Tachycardic and irregular.   Pulmonary/Chest: Effort normal. No respiratory distress. She has no wheezes.  Lungs have rales at both bases.    Abdominal: Soft. Bowel sounds are normal. She exhibits no distension and no mass. There is no tenderness.  Musculoskeletal: Normal range of motion. She exhibits edema.  1+ edema  Lymphadenopathy:    She has no cervical adenopathy.  Neurological: She is alert and oriented to person, place, and time. She has normal reflexes. No cranial nerve deficit.  Skin: Skin is warm and dry. No rash noted.  Psychiatric: She has a normal mood and affect. Her behavior is normal.  Nursing note and vitals reviewed.   ED Course  Procedures (including critical care time)  DIAGNOSTIC STUDIES: Oxygen Saturation is 93% on RA, adequate by my interpretation.    COORDINATION OF CARE:  2:00 AM Patient is currently a client of [facility] and had previously stated that no aggressive measures be taken, however, she  changed her mind today.     Labs Review Results for orders placed or performed during the hospital encounter of 35/57/32  Basic metabolic panel  Result Value Ref Range   Sodium 134 (L) 137 - 147 mEq/L   Potassium 5.0 3.7 - 5.3 mEq/L   Chloride 85 (L) 96 - 112 mEq/L   CO2 35 (H) 19 - 32 mEq/L   Glucose, Bld 111 (H) 70 - 99 mg/dL   BUN 33 (H) 6 - 23 mg/dL   Creatinine, Ser 1.41 (H) 0.50 - 1.10 mg/dL   Calcium 9.0 8.4 - 10.5 mg/dL   GFR calc non Af Amer 33 (L) >90 mL/min   GFR calc Af Amer 38 (L) >90 mL/min   Anion gap 14 5 - 15  Pro b natriuretic peptide (BNP)  Result Value Ref Range   Pro B Natriuretic peptide (BNP) 5012.0 (H) 0 - 450 pg/mL  CBC with Differential  Result Value Ref Range   WBC 18.1 (H) 4.0 - 10.5 K/uL   RBC 3.79 (L) 3.87 - 5.11 MIL/uL   Hemoglobin 10.8 (L) 12.0 - 15.0 g/dL   HCT 33.9 (L) 36.0 - 46.0 %   MCV 89.4 78.0 - 100.0 fL   MCH 28.5 26.0 - 34.0 pg   MCHC 31.9 30.0 - 36.0 g/dL   RDW 16.7 (H) 11.5 - 15.5 %   Platelets 521 (H) 150 - 400 K/uL   Neutrophils Relative % 83 (H) 43 - 77 %   Neutro Abs 15.1 (H) 1.7 - 7.7 K/uL   Lymphocytes Relative 8 (L) 12 - 46 %   Lymphs Abs 1.5 0.7 - 4.0 K/uL   Monocytes Relative 9 3 - 12 %   Monocytes Absolute 1.6 (H) 0.1 - 1.0 K/uL   Eosinophils Relative 0 0 - 5 %   Eosinophils Absolute 0.0 0.0 - 0.7 K/uL   Basophils Relative 0 0 - 1 %   Basophils Absolute 0.0 0.0 - 0.1 K/uL  Troponin I  Result Value Ref Range   Troponin I <0.30 <0.30 ng/mL   Dg Chest Port 1 View  01/18/2014   CLINICAL DATA:  Shortness of breath.  Initial encounter.  EXAM: PORTABLE CHEST - 1 VIEW  COMPARISON:  01/07/2014  FINDINGS: Cardiomegaly with bilateral airspace opacities again noted.  Increasing a left lower lung consolidation/atelectasis noted.  Bilateral pleural effusions are again identified, likely increased on the left.  There is no evidence of pneumothorax.  IMPRESSION: Increased left lower lung consolidations/atelectasis.  Unchanged  bilateral airspace opacities, cardiomegaly and small right pleural effusion. Question increased left pleural effusion.   Electronically Signed   By: Hassan Rowan M.D.   On: 01/18/2014 02:26   Dg Chest Port 1 View  01/07/2014   CLINICAL DATA:  Shortness of breath and palpitations. Recent hospitalization for new onset atrial fibrillation. Subsequent encounter.  EXAM: PORTABLE CHEST - 1 VIEW  COMPARISON:  11/17/2013 and 11/10/2013 radiographs; CT 10/11/2013.  FINDINGS: 2255 hr. There is stable cardiomegaly and aortic atherosclerosis. Small bilateral pleural effusions are present. There are bilateral airspace opacities which are similar to the most recent examination. These have increased from radiographs obtained 3 months ago. These were evaluated by CT in August and appear progressive since then. Left glenohumeral degenerative changes are present. There are no acute osseous findings. Several telemetry leads overlie the chest.  IMPRESSION: Persistent diffuse bilateral airspace opacities and bilateral pleural effusions, similar to most recent examination of 6 weeks ago, but progressive over the last several months. Although there may be a component of edema, the findings are suspicious for an infectious or inflammatory process. Correlate clinically.   Electronically Signed   By: Camie Patience M.D.   On: 01/07/2014 23:11     Imaging Review Dg Chest Port 1 View  01/18/2014   CLINICAL DATA:  Shortness of breath.  Initial encounter.  EXAM: PORTABLE CHEST - 1 VIEW  COMPARISON:  01/07/2014  FINDINGS: Cardiomegaly with bilateral airspace opacities again noted.  Increasing a left lower lung consolidation/atelectasis noted.  Bilateral pleural effusions are again identified, likely increased on the left.  There is no evidence of pneumothorax.  IMPRESSION: Increased left lower lung consolidations/atelectasis.  Unchanged bilateral airspace opacities, cardiomegaly and small right pleural effusion. Question increased left  pleural effusion.   Electronically Signed   By: Hassan Rowan M.D.   On: 01/18/2014 02:26     EKG Interpretation   Date/Time:  Tuesday January 18 2014  02:02:47 EST Ventricular Rate:  129 PR Interval:    QRS Duration: 122 QT Interval:  331 QTC Calculation: 485 R Axis:   115 Text Interpretation:  Atrial fibrillation with rapid ventricular response  Nonspecific intraventricular conduction delay Repol abnrm suggests  ischemia, lateral leads Low voltage QRS When compared with ECG of  01/10/2014, HEART RATE has increased Confirmed by [facility]  MD, Yacoub Diltz ([id number])  on 01/18/2014 2:06:08 AM      CRITICAL CARE Performed by: FGHWE,XHBZJ Total critical care time: 60 minutes Critical care time was exclusive of separately billable procedures and treating other patients. Critical care was necessary to treat or prevent imminent or life-threatening deterioration. Critical care was time spent personally by me on the following activities: development of treatment plan with patient and/or surrogate as well as nursing, discussions with consultants, evaluation of patient's response to treatment, examination of patient, obtaining history from patient or surrogate, ordering and performing treatments and interventions, ordering and review of laboratory studies, ordering and review of radiographic studies, pulse oximetry and re-evaluation of patient's condition.  MDM   Final diagnoses:  Dyspnea  Atrial fibrillation with rapid ventricular response  CHF exacerbation  Acute kidney injury  Normochromic anemia  Thrombocytosis  Metabolic alkalosis    Atrial fibrillation with rapid ventricular response. Dyspnea which appears to be exacerbation of congestive heart failure. This may be related to the atrial fibrillation. Old records are reviewed and she had recently refused cardioversion and she has been placed on hospice and comfort care. Patient tells me, however, that she has changed her mind and is now willing to  consider aggressive treatment including cardioversion. She started on diltiazem for rate control, but she did become hypotensive and diltiazem had to be stopped. Heart rate did come down with diltiazem. Cardiology has been consulted to consider for possible elective cardioversion today. She will need to be admitted to the hospitalist service. Case has been discussed with Dr. Hartford Poli of [facility] who agrees to admit the patient.  I personally performed the services described in this documentation, which was scribed in my presence. The recorded information has been reviewed and is accurate.     Julie Fuel, MD 69/67/89 [id number]

## 2014-01-18 NOTE — ED Notes (Signed)
Watery stool present. Pt washed by RNs and EMT. Chucks and sheets changed.

## 2014-01-19 DIAGNOSIS — J189 Pneumonia, unspecified organism: Secondary | ICD-10-CM

## 2014-01-19 LAB — COMPREHENSIVE METABOLIC PANEL
ALT: 23 U/L (ref 0–35)
AST: 35 U/L (ref 0–37)
Albumin: 2.1 g/dL — ABNORMAL LOW (ref 3.5–5.2)
Alkaline Phosphatase: 82 U/L (ref 39–117)
Anion gap: 13 (ref 5–15)
BUN: 32 mg/dL — ABNORMAL HIGH (ref 6–23)
CALCIUM: 8.5 mg/dL (ref 8.4–10.5)
CO2: 36 mEq/L — ABNORMAL HIGH (ref 19–32)
Chloride: 92 mEq/L — ABNORMAL LOW (ref 96–112)
Creatinine, Ser: 1.18 mg/dL — ABNORMAL HIGH (ref 0.50–1.10)
GFR calc Af Amer: 47 mL/min — ABNORMAL LOW (ref >90)
GFR calc non Af Amer: 41 mL/min — ABNORMAL LOW (ref >90)
GLUCOSE: 84 mg/dL (ref 70–99)
Potassium: 4.3 mEq/L (ref 3.7–5.3)
SODIUM: 141 meq/L (ref 137–147)
Total Bilirubin: 0.8 mg/dL (ref 0.3–1.2)
Total Protein: 7.1 g/dL (ref 6.0–8.3)

## 2014-01-19 LAB — CBC
HCT: 33.7 % — ABNORMAL LOW (ref 36.0–46.0)
HEMOGLOBIN: 10.5 g/dL — AB (ref 12.0–15.0)
MCH: 27.7 pg (ref 26.0–34.0)
MCHC: 31.2 g/dL (ref 30.0–36.0)
MCV: 88.9 fL (ref 78.0–100.0)
Platelets: 434 10*3/uL — ABNORMAL HIGH (ref 150–400)
RBC: 3.79 MIL/uL — AB (ref 3.87–5.11)
RDW: 17.2 % — ABNORMAL HIGH (ref 11.5–15.5)
WBC: 19.5 10*3/uL — ABNORMAL HIGH (ref 4.0–10.5)

## 2014-01-19 LAB — HEMOGLOBIN A1C
Hgb A1c MFr Bld: 5.6 % (ref <5.7)
Mean Plasma Glucose: 114 mg/dL (ref <117)

## 2014-01-19 MED ORDER — ALPRAZOLAM 0.25 MG PO TABS
0.2500 mg | ORAL_TABLET | Freq: Three times a day (TID) | ORAL | Status: DC | PRN
  Administered 2014-01-19 (×2): 0.25 mg via ORAL
  Filled 2014-01-19 (×2): qty 1

## 2014-01-19 MED ORDER — LEVALBUTEROL HCL 1.25 MG/0.5ML IN NEBU
1.2500 mg | INHALATION_SOLUTION | Freq: Once | RESPIRATORY_TRACT | Status: AC
  Administered 2014-01-19: 1.25 mg via RESPIRATORY_TRACT
  Filled 2014-01-19: qty 0.5

## 2014-01-19 MED ORDER — MORPHINE SULFATE 2 MG/ML IJ SOLN
2.0000 mg | Freq: Once | INTRAMUSCULAR | Status: AC
  Administered 2014-01-19: 2 mg via INTRAVENOUS
  Filled 2014-01-19: qty 1

## 2014-01-19 MED ORDER — MORPHINE SULFATE 2 MG/ML IJ SOLN: 2.0000 mg | Freq: Once | INTRAMUSCULAR | Status: DC

## 2014-01-19 MED ORDER — AMIODARONE HCL 200 MG PO TABS
400.0000 mg | ORAL_TABLET | Freq: Two times a day (BID) | ORAL | Status: DC
  Administered 2014-01-19: 400 mg via ORAL
  Filled 2014-01-19: qty 2

## 2014-01-19 MED ORDER — VANCOMYCIN HCL 10 G IV SOLR
1500.0000 mg | INTRAVENOUS | Status: DC
  Administered 2014-01-19: 1500 mg via INTRAVENOUS
  Filled 2014-01-19 (×2): qty 1500

## 2014-01-19 MED ORDER — DEXTROSE 5 % IV SOLN
1.0000 g | INTRAVENOUS | Status: DC
  Administered 2014-01-19: 1 g via INTRAVENOUS
  Filled 2014-01-19 (×2): qty 1

## 2014-01-19 NOTE — Progress Notes (Addendum)
Patient Name: Julie Haas Date of Encounter: 01/19/2014     Principal Problem:   Acute respiratory failure with hypoxia Active Problems:   HTN (hypertension)   Atrial thrombus on TEE 11/15/13   Atrial fibrillation with RVR   Acute exacerbation of congestive heart failure   Atrial fibrillation with rapid ventricular response    SUBJECTIVE  Very uncomfortable. SOB. She is frustrated because she did not think she would end up in the hospital again. DAughter very upset and crying. They want to proceed with DCCV now.   CURRENT MEDS . amiodarone  400 mg Oral Daily  . apixaban  5 mg Oral BID  . furosemide  60 mg Intravenous Q12H  . metoprolol tartrate  25 mg Oral BID  . polyethylene glycol  17 g Oral Daily  . sodium chloride  3 mL Intravenous Q12H    OBJECTIVE  Filed Vitals:   01/19/14 0500 01/19/14 0501 01/19/14 0630 01/19/14 0818  BP: 101/59 101/59  123/87  Pulse: 117 117 120 122  Temp:    97.8 F (36.6 C)  TempSrc:    Oral  Resp: 25 25 23 20   Height:      Weight:      SpO2: 91%  92% 92%    Intake/Output Summary (Last 24 hours) at 01/19/14 1020 Last data filed at 01/19/14 0420  Gross per 24 hour  Intake      0 ml  Output    750 ml  Net   -750 ml   Filed Weights   01/18/14 0158 01/19/14 0400  Weight: 185 lb (83.915 kg) 184 lb 8.4 oz (83.7 kg)    PHYSICAL EXAM  General: Pleasant, NAD. On oxygen by mask Psych: Normal affect. Neuro: Alert and oriented X 3. Moves all extremities spontaneously. HEENT: Normal Neck: Supple without bruits or + JVD. Lungs: Prominent rales and rhonchi bilaterally Heart: Rapid irregular heart rate, no s3, s4, or murmurs. tachycardic Abdomen: Soft, non-tender, non-distended, BS + x 4.  Extremities: Bilateral pretibial and pedal edema. There is erythema of both lower legs.  Accessory Clinical Findings  CBC  Recent Labs  01/18/14 0207 01/19/14 0408  WBC 18.1* 19.5*  NEUTROABS 15.1*  --   HGB 10.8* 10.5*    HCT 33.9* 33.7*  MCV 89.4 88.9  PLT 521* 626*   Basic Metabolic Panel  Recent Labs  01/18/14 0207 01/19/14 0408  NA 134* 141  K 5.0 4.3  CL 85* 92*  CO2 35* 36*  GLUCOSE 111* 84  BUN 33* 32*  CREATININE 1.41* 1.18*  CALCIUM 9.0 8.5   Liver Function Tests  Recent Labs  01/19/14 0408  AST 35  ALT 23  ALKPHOS 82  BILITOT 0.8  PROT 7.1  ALBUMIN 2.1*    Cardiac Enzymes  Recent Labs  01/18/14 0207  TROPONINI <0.30    Thyroid Function Tests  Recent Labs  01/18/14 1000  TSH 2.550    TELE  afib with RVR HR 130s  Radiology/Studies  Dg Chest Port 1 View  01/18/2014   CLINICAL DATA:  Shortness of breath.  Initial encounter.  EXAM: PORTABLE CHEST - 1 VIEW  COMPARISON:  01/07/2014  FINDINGS: Cardiomegaly with bilateral airspace opacities again noted.  Increasing a left lower lung consolidation/atelectasis noted.  Bilateral pleural effusions are again identified, likely increased on the left.  There is no evidence of pneumothorax.  IMPRESSION: Increased left lower lung consolidations/atelectasis.  Unchanged bilateral airspace opacities, cardiomegaly and small right pleural effusion. Question increased left  pleural effusion.   Electronically Signed   By: Hassan Rowan M.D.   On: 01/18/2014 02:26   Dg Chest Port 1 View  01/07/2014   CLINICAL DATA:  Shortness of breath and palpitations. Recent hospitalization for new onset atrial fibrillation. Subsequent encounter.  EXAM: PORTABLE CHEST - 1 VIEW  COMPARISON:  11/17/2013 and 11/10/2013 radiographs; CT 10/11/2013.  FINDINGS: 2255 hr. There is stable cardiomegaly and aortic atherosclerosis. Small bilateral pleural effusions are present. There are bilateral airspace opacities which are similar to the most recent examination. These have increased from radiographs obtained 3 months ago. These were evaluated by CT in August and appear progressive since then. Left glenohumeral degenerative changes are present. There are no acute osseous  findings. Several telemetry leads overlie the chest.  IMPRESSION: Persistent diffuse bilateral airspace opacities and bilateral pleural effusions, similar to most recent examination of 6 weeks ago, but progressive over the last several months. Although there may be a component of edema, the findings are suspicious for an infectious or inflammatory process. Correlate clinically.   Electronically Signed   By: Camie Patience M.D.   On: 01/07/2014 23:11    ASSESSMENT AND PLAN  Julie Haas is a 80 y.o. female with a history of chronic diastolic CHF, persistent atrial fibrillation, HLD, HTN, carotid artery stenosis, aortic stenosis, recent admission (last week) for acute on chronic diastolic CHF, HCAP and afib with RVR who was re-admitted yesterday with worsening dyspnea. She is found to have acute PNA with leukocytosis, acute on chronic diastolic CHF and afib with RVR.   Persistent atrial fibrillation with RVR. The patient has been adamant about not wanting to do DCCV on previous admissions.In the past she has been able to be cardioverted in August 2015 but reverted to atrial fib after only 4 days. However, now she has been preloaded with amiodarone.Dr. Mare Ferrari suggested aggressive treatment of her underlying pneumonia and treatment of her acute on chronic exacerbation of diastolic heart failure with IV Lasix to try to optimize her cardiac status and then proceed with DCCV once her pneumonia/ acute CHF has been stabilized.She has been adequately anticoagulated with Apixaban.  -- Continue amiodarone, Apixaban, and beta blocker. -- Patient now willing to proceed with DCCV for symptomatic relief.   Acute on chronic diastolic heart failure  -- BNP 5K and CXR with CHF -- Continue IV Lasix 60mg  IV BID. Net neg 750 -- Still significantly volume overloaded  Mild renal insufficiency- improved 1.41--> 1.18. Continue to monitor with diuresis  Probable left lower lobe pneumonia with leukocytosis- At the  present time she has evidence of worsening pneumonia with leukocytosis and white count 19,000 which is exacerbating her rapid heart rate response to her atrial fib. She has evidence of worsening CHF by chest x-ray. -- Abx per IM  Hypotension- now resolved.   Aortic stenosis- mild on 2D ECHO 09/2013.  Dispo- Presently a hospice patient -- Seen by palliative care    Signed, Eileen Stanford PA-C  Pager [phone] [place] seen  Agree with findings as noted by Kathlene November above  Remains in atrial fibrillation  Rates not optimally controlled  Limited by BP  Will increase amio for better control    Continue lasix    Dorris Carnes

## 2014-01-19 NOTE — Progress Notes (Addendum)
Inpatient RN visit- [facility] 3W Room 24 -[facility] RN Visit-Karen Alford Highland RN  Related admission to [facility] diagnosis of Heart Failure. Pt is DNR  code.   Pt seen at bedside, lying supine in bed, eyes open, patient more alert and interactive at this visit. Denied pain. Daughter Rod Holler present. Education provided to Rod Holler regarding hospice services/hospice benefit as she was not present for the hospice admission last week. Rod Holler shared and chart notes indicate that patient has changed her mind about the cardioversion and is now considering having it. Her blood pressure has improved and she has been able to take her po medications. Appetite remains poor, patient mostly drinking water and eating ice chips. Rod Holler also reports that she has witnessed patient having "anxiety attacks" since she has been with her. Per chart notes patient did have a dose of IV morphine at 950 this am, for dyspnea. Discussed the benefit of trying an antianxiety medication, Rod Holler voiced her agreement, the patient was also agreeable. Staff RN Josh alerted and requested an order from attending physician. Patient did recv xanax 0.25mg  prior to the end of the visit. Writer also addressed the benefits of using the liquid morphine over the IV with both Rod Holler and the patient, both voiced understanding. [facility] SW Cathy Hoff in at end of nursing visit. Patient cleaned and repositioned for comfort with staff aide and appeared to be resting with her eyes closed at end of visit. She denied pain or dyspnea.  [facility] will continue to follow and collaborate with hospital staff and attending physician regarding symptom management and patient needs. Patient's home medication list, transfer summary and OOF DNR/MOST in place on shadow chart.  Please call [facility] @ [phone]  with any hospice needs.   Thank you. Tracey Harries, RN  [facility]  Hospice Liaison  [phone])

## 2014-01-19 NOTE — Progress Notes (Signed)
PATIENT DETAILS Name: Julie Haas Age: 80 y.o. Sex: female Date of Birth: 09/23/1925 Admit Date: 01/18/2014 Admitting Physician Verlee Monte, MD JXB:[id number], Viviann Spare, MD  Subjective: Breathing better.Still in Afib RVR and with significant volume overload.  Assessment/Plan: Principal Problem:   Acute respiratory failure with hypoxia:multifactorial-HCAP/Acute Diastolic Failure. Continue IV Lasix, Abx. Follow Clinically  Active Problems:   Acute Diastolic NFA:OZHYQ with significant volumed overload. Continue Lasix, follow I&O's, weight.     Atrial fibrillation with RVR:on Amiodarone, BP unable to tolerate Cardizem gtt. Cards following. On Eliquis. Cards planning DC cardioversiononce more stable.    HCAP:significant leukocytosis, CXR suggests PNA, start Vanco/Cefepime per pharmacy    Ethics:similar numerous recent hospitalizations over the past few months. Clearly not doing well and with failure to thrive symptoms. Per last palliative care meeting, she did not want to be readmitted, and wanted to transition to full comfort measures if she deteriorated. Now again hospitalized, will attempt to reach out to family today. I think she is a good candidate for residential hospice-if she/family agreeable, I suspect she will not do good irrespective of DC Cardioversion.  Disposition: Remain inpatient  Antibiotics:  IV Vanco 12/2>>  IV Cefepime 12/2>>    Anti-infectives    None      DVT Prophylaxis: Eliquis  Code Status:  DNR  Family Communication Daughter at bedside this am  Procedures:  None  CONSULTS:  cardiology  Time spent 40 minutes-which includes 50% of the time with face-to-face with patient/ family and coordinating care related to the above assessment and plan.  MEDICATIONS: Scheduled Meds: . amiodarone  400 mg Oral Daily  . apixaban  5 mg Oral BID  . furosemide  60 mg Intravenous Q12H  . metoprolol tartrate  25 mg Oral BID  . polyethylene  glycol  17 g Oral Daily  . sodium chloride  3 mL Intravenous Q12H   Continuous Infusions: . diltiazem (CARDIZEM) infusion     PRN Meds:.acetaminophen **OR** acetaminophen, ALPRAZolam, HYDROcodone-acetaminophen, morphine injection, morphine CONCENTRATE    PHYSICAL EXAM: Vital signs in last 24 hours: Filed Vitals:   01/19/14 0500 01/19/14 0501 01/19/14 0630 01/19/14 0818  BP: 101/59 101/59  123/87  Pulse: 117 117 120 122  Temp:    97.8 F (36.6 C)  TempSrc:    Oral  Resp: 25 25 23 20   Height:      Weight:      SpO2: 91%  92% 92%    Weight change: -0.215 kg (-7.6 oz) Filed Weights   01/18/14 0158 01/19/14 0400  Weight: 83.915 kg (185 lb) 83.7 kg (184 lb 8.4 oz)   Body mass index is 31.66 kg/(m^2).   Gen Exam: Awake and alert, but somewhat lethargic Neck: Supple, No JVD.   Chest: Bibasilar rales all over  CVS: S1 S2 irregular-tachy Abdomen: soft, BS +, non tender, non distended.  Extremities: ++ edema, lower extremities warm to touch. Neurologic: Non Focal-but with gen weakness Skin: No Rash.   Wounds: N/A.   Intake/Output from previous day:  Intake/Output Summary (Last 24 hours) at 01/19/14 1343 Last data filed at 01/19/14 0420  Gross per 24 hour  Intake      0 ml  Output    750 ml  Net   -750 ml     LAB RESULTS: CBC  Recent Labs Lab 01/18/14 0207 01/19/14 0408  WBC 18.1* 19.5*  HGB 10.8* 10.5*  HCT 33.9* 33.7*  PLT 521* 434*  MCV 89.4  88.9  MCH 28.5 27.7  MCHC 31.9 31.2  RDW 16.7* 17.2*  LYMPHSABS 1.5  --   MONOABS 1.6*  --   EOSABS 0.0  --   BASOSABS 0.0  --     Chemistries   Recent Labs Lab 01/17/14 01/18/14 0207 01/19/14 0408  NA  --  134* 141  K 4.2 5.0 4.3  CL  --  85* 92*  CO2  --  35* 36*  GLUCOSE  --  111* 84  BUN  --  33* 32*  CREATININE  --  1.41* 1.18*  CALCIUM  --  9.0 8.5    CBG: No results for input(s): GLUCAP in the last 168 hours.  GFR Estimated Creatinine Clearance: 35.8 mL/min (by C-G formula based on Cr of  1.18).  Coagulation profile No results for input(s): INR, PROTIME in the last 168 hours.  Cardiac Enzymes  Recent Labs Lab 01/18/14 0207  TROPONINI <0.30    Invalid input(s): POCBNP No results for input(s): DDIMER in the last 72 hours.  Recent Labs  01/18/14 1000  HGBA1C 5.6   No results for input(s): CHOL, HDL, LDLCALC, TRIG, CHOLHDL, LDLDIRECT in the last 72 hours.  Recent Labs  01/18/14 1000  TSH 2.550   No results for input(s): VITAMINB12, FOLATE, FERRITIN, TIBC, IRON, RETICCTPCT in the last 72 hours. No results for input(s): LIPASE, AMYLASE in the last 72 hours.  Urine Studies No results for input(s): UHGB, CRYS in the last 72 hours.  Invalid input(s): UACOL, UAPR, USPG, UPH, UTP, UGL, UKET, UBIL, UNIT, UROB, ULEU, UEPI, UWBC, URBC, UBAC, CAST, UCOM, BILUA  MICROBIOLOGY: Recent Results (from the past 240 hour(s))  MRSA PCR Screening     Status: None   Collection Time: 01/18/14  9:05 AM  Result Value Ref Range Status   MRSA by PCR NEGATIVE NEGATIVE Final    Comment:        The GeneXpert MRSA Assay (FDA approved for NASAL specimens only), is one component of a comprehensive MRSA colonization surveillance program. It is not intended to diagnose MRSA infection nor to guide or monitor treatment for MRSA infections.     RADIOLOGY STUDIES/RESULTS: Dg Chest Port 1 View  01/18/2014   CLINICAL DATA:  Shortness of breath.  Initial encounter.  EXAM: PORTABLE CHEST - 1 VIEW  COMPARISON:  01/07/2014  FINDINGS: Cardiomegaly with bilateral airspace opacities again noted.  Increasing a left lower lung consolidation/atelectasis noted.  Bilateral pleural effusions are again identified, likely increased on the left.  There is no evidence of pneumothorax.  IMPRESSION: Increased left lower lung consolidations/atelectasis.  Unchanged bilateral airspace opacities, cardiomegaly and small right pleural effusion. Question increased left pleural effusion.   Electronically Signed    By: Hassan Rowan M.D.   On: 01/18/2014 02:26   Dg Chest Port 1 View  01/07/2014   CLINICAL DATA:  Shortness of breath and palpitations. Recent hospitalization for new onset atrial fibrillation. Subsequent encounter.  EXAM: PORTABLE CHEST - 1 VIEW  COMPARISON:  11/17/2013 and 11/10/2013 radiographs; CT 10/11/2013.  FINDINGS: 2255 hr. There is stable cardiomegaly and aortic atherosclerosis. Small bilateral pleural effusions are present. There are bilateral airspace opacities which are similar to the most recent examination. These have increased from radiographs obtained 3 months ago. These were evaluated by CT in August and appear progressive since then. Left glenohumeral degenerative changes are present. There are no acute osseous findings. Several telemetry leads overlie the chest.  IMPRESSION: Persistent diffuse bilateral airspace opacities and bilateral pleural effusions, similar  to most recent examination of 6 weeks ago, but progressive over the last several months. Although there may be a component of edema, the findings are suspicious for an infectious or inflammatory process. Correlate clinically.   Electronically Signed   By: Camie Patience M.D.   On: 01/07/2014 23:11    Oren Binet, MD  [facility] Pager:[phone]  If 7PM-7AM, please contact night-coverage [email or web address] Password [email or web address] 01/19/2014, 1:43 PM   LOS: 1 day

## 2014-01-19 NOTE — Progress Notes (Signed)
ANTIBIOTIC CONSULT NOTE - INITIAL  Pharmacy Consult for Vancomycin and Cefepime Indication: HCAP  Allergies  Allergen Reactions  . Codeine     Unknown - currently tolerating morphine  . Cucumber Extract Nausea And Vomiting  . Diuretic [Buchu-[place]sil[place]-Ch Grass-Hydran]     Unknown  . Lipitor [Atorvastatin Calcium]     Unknown  . Penicillins Other (See Comments)    unknown  . Pravachol     Unknown    Patient Measurements: Height: 5' 4"  (162.6 cm) Weight: 184 lb 8.4 oz (83.7 kg) IBW/kg (Calculated) : 54.7 Adjusted Body Weight:   Vital Signs: Temp: 97.8 F (36.6 C) (12/02 0818) Temp Source: Oral (12/02 0818) BP: 123/87 mmHg (12/02 0818) Pulse Rate: 122 (12/02 0818) Intake/Output from previous day: 12/01 0701 - 12/02 0700 In: -  Out: 750 [Urine:750] Intake/Output from this shift:    Labs:  Recent Labs  01/18/14 0207 01/19/14 0408  WBC 18.1* 19.5*  HGB 10.8* 10.5*  PLT 521* 434*  CREATININE 1.41* 1.18*   Estimated Creatinine Clearance: 35.8 mL/min (by C-G formula based on Cr of 1.18). No results for input(s): VANCOTROUGH, VANCOPEAK, VANCORANDOM, GENTTROUGH, GENTPEAK, GENTRANDOM, TOBRATROUGH, TOBRAPEAK, TOBRARND, AMIKACINPEAK, AMIKACINTROU, AMIKACIN in the last 72 hours.   Microbiology: Recent Results (from the past 720 hour(s))  Culture, blood (routine x 2)     Status: None   Collection Time: 01/07/14 11:44 PM  Result Value Ref Range Status   Specimen Description BLOOD RIGHT FOREARM  Final   Special Requests BOTTLES DRAWN AEROBIC AND ANAEROBIC 10CC  Final   Culture  Setup Time   Final    01/08/2014 03:09 Performed at [facility]    Culture   Final    NO GROWTH 5 DAYS Performed at [facility]    Report Status 01/14/2014 FINAL  Final  Culture, blood (routine x 2)     Status: None   Collection Time: 01/07/14 11:50 PM  Result Value Ref Range Status   Specimen Description BLOOD RIGHT ANTECUBITAL  Final   Special Requests BOTTLES DRAWN  AEROBIC AND ANAEROBIC 5CC  Final   Culture  Setup Time   Final    01/08/2014 04:21 Performed at [facility]    Culture   Final    NO GROWTH 5 DAYS Performed at [facility]    Report Status 01/14/2014 FINAL  Final  Urine culture     Status: None   Collection Time: 01/08/14  4:01 AM  Result Value Ref Range Status   Specimen Description URINE, CATHETERIZED  Final   Special Requests NONE  Final   Culture  Setup Time   Final    01/08/2014 14:53 Performed at [place] Performed at [facility]   Final   Culture NO GROWTH Performed at [facility]   Final   Report Status 01/09/2014 FINAL  Final  MRSA PCR Screening     Status: None   Collection Time: 01/08/14  6:46 AM  Result Value Ref Range Status   MRSA by PCR NEGATIVE NEGATIVE Final    Comment:        The GeneXpert MRSA Assay (FDA approved for NASAL specimens only), is one component of a comprehensive MRSA colonization surveillance program. It is not intended to diagnose MRSA infection nor to guide or monitor treatment for MRSA infections.   Respiratory virus panel     Status: None   Collection Time: 01/08/14  8:40 PM  Result  Value Ref Range Status   Source - RVPAN NASOPHARYNGEAL  Final   Respiratory Syncytial Virus A NOT DETECTED  Final   Respiratory Syncytial Virus B NOT DETECTED  Final   Influenza A NOT DETECTED  Final   Influenza B NOT DETECTED  Final   Parainfluenza 1 NOT DETECTED  Final   Parainfluenza 2 NOT DETECTED  Final   Parainfluenza 3 NOT DETECTED  Final   Metapneumovirus NOT DETECTED  Final   Rhinovirus NOT DETECTED  Final   Adenovirus NOT DETECTED  Final   Influenza A H1 NOT DETECTED  Final   Influenza A H3 NOT DETECTED  Final    Comment: (NOTE)       Normal Reference Range for each Analyte: NOT DETECTED Testing performed using the Luminex xTAG Respiratory Viral Panel test kit. The analytical performance characteristics of  this assay have been determined by [facility].  The modifications have not been cleared or approved by the FDA. This assay has been validated pursuant to the CLIA regulations and is used for clinical purposes. Performed at [place] PCR Screening     Status: None   Collection Time: 01/18/14  9:05 AM  Result Value Ref Range Status   MRSA by PCR NEGATIVE NEGATIVE Final    Comment:        The GeneXpert MRSA Assay (FDA approved for NASAL specimens only), is one component of a comprehensive MRSA colonization surveillance program. It is not intended to diagnose MRSA infection nor to guide or monitor treatment for MRSA infections.     Medical History: Past Medical History  Diagnosis Date  . Hyperlipidemia   . Retinal detachment   . Carotid artery stenosis 03/24/2012  . HTN (hypertension) 06/27/2009  . Other generalized ischemic cerebrovascular disease 06/27/2009  . Vitamin D deficiency 05/16/2009  . Family history of sudden cardiac death (SCD) 11/30/2008  . Varicose veins of lower extremities with inflammation 03/16/2003  . Atrial fibrillation, persistent   . Chronic diastolic CHF (congestive heart failure)     Medications:  Prescriptions prior to admission  Medication Sig Dispense Refill Last Dose  . amiodarone (PACERONE) 400 MG tablet Take 1 tablet (400 mg total) by mouth daily.   01/17/2014 at Unknown time  . apixaban (ELIQUIS) 5 MG TABS tablet Take 1 tablet (5 mg total) by mouth 2 (two) times daily. 60 tablet 11 01/17/2014 at Unknown time  . furosemide (LASIX) 80 MG tablet Take 80 mg by mouth 2 (two) times daily.   01/17/2014 at Unknown time  . guaiFENesin-dextromethorphan (ROBITUSSIN DM) 100-10 MG/5ML syrup Take 10 mLs by mouth every 6 (six) hours as needed for cough.   unknown  . levalbuterol (XOPENEX) 0.63 MG/3ML nebulizer solution Take 3 mLs (0.63 mg total) by nebulization every 4 (four) hours as needed for wheezing or shortness of breath. 3 mL 12  01/16/2014  . metoprolol tartrate (LOPRESSOR) 25 MG tablet Take 1 tablet (25 mg total) by mouth 2 (two) times daily.   01/17/2014 at 2000  . Morphine Sulfate (MORPHINE CONCENTRATE) 10 MG/0.5ML SOLN concentrated solution Take 0.5 mLs (10 mg total) by mouth every 2 (two) hours as needed for moderate pain, severe pain, anxiety or shortness of breath. 30 mL 0 01/17/2014 at Unknown time  . omeprazole (PRILOSEC) 20 MG capsule Take 20 mg by mouth daily.   01/17/2014 at Unknown time  . OXYGEN Inhale 5 L into the lungs continuous.   01/18/2014 at Unknown time  . polyethylene  glycol (MIRALAX / GLYCOLAX) packet Take 17 g by mouth daily.   01/17/2014 at Unknown time  . potassium chloride SA (K-DUR,KLOR-CON) 20 MEQ tablet Take 40 mEq by mouth daily.   01/17/2014 at Unknown time  . promethazine (PHENERGAN) 12.5 MG tablet Take 12.5 mg by mouth every 6 (six) hours as needed for nausea or vomiting.   01/15/2014  . senna (SENOKOT) 8.6 MG tablet Take 2 tablets by mouth 2 (two) times daily.   01/17/2014 at Unknown time  . albuterol (PROVENTIL) (2.5 MG/3ML) 0.083% nebulizer solution Take 2.5 mg by nebulization every 6 (six) hours as needed for wheezing or shortness of breath.   unknown  . ipratropium (ATROVENT) 0.02 % nebulizer solution Inhale 0.2 mg into the lungs every 4 (four) hours as needed for wheezing or shortness of breath.   0 Past Month at Unknown time  . levofloxacin (LEVAQUIN) 750 MG tablet Take 1 tablet (750 mg total) by mouth daily. 3 tablet 0 01/14/14   Assessment: 88yof to start on Vancomycin and Cefepime for HCAP. Patient was recently discharged on Levofloxacin to complete treatment for PNA. Patient has PCN allergy but has tolerated Cefepime during previous admissions (9/15). - SCr 1.18, CrCl 36  Goal of Therapy:  Vancomycin trough level 15-20 mcg/ml  Plan:  1. Vancomycin 1.5g IV q24h 2. Cefepime 1g IV q24h 3. Monitor renal function, cultures, clinical course and order Vancomycin trough at  [facility]  [phone] 01/19/2014,2:03 PM

## 2014-01-20 MED ORDER — ALBUTEROL SULFATE (2.5 MG/3ML) 0.083% IN NEBU: 2.5000 mg | INHALATION_SOLUTION | RESPIRATORY_TRACT | Status: AC | PRN

## 2014-01-20 MED ORDER — SCOPOLAMINE 1 MG/3DAYS TD PT72: 1.0000 | MEDICATED_PATCH | TRANSDERMAL | Status: AC

## 2014-01-20 MED ORDER — LORAZEPAM 2 MG/ML PO CONC: 1.0000 mg | ORAL | Status: AC | PRN

## 2014-01-20 MED ORDER — MORPHINE SULFATE (CONCENTRATE) 10 MG /0.5 ML PO SOLN: 5.0000 mg | ORAL | Status: AC | PRN

## 2014-01-20 MED ORDER — ATROPINE SULFATE 1 % OP SOLN: 2.0000 [drp] | Freq: Four times a day (QID) | OPHTHALMIC | Status: AC

## 2014-01-20 MED ORDER — SODIUM CHLORIDE 0.9 % IV SOLN
2.0000 mg/h | INTRAVENOUS | Status: DC
  Administered 2014-01-20: 2 mg/h via INTRAVENOUS
  Filled 2014-01-20: qty 10

## 2014-01-20 MED ORDER — MORPHINE SULFATE 2 MG/ML IJ SOLN
1.0000 mg | INTRAMUSCULAR | Status: DC | PRN
  Administered 2014-01-20: 1 mg via INTRAVENOUS

## 2014-01-20 MED ORDER — MORPHINE SULFATE (CONCENTRATE) 10 MG/0.5ML PO SOLN: 10.0000 mg | ORAL | Status: AC

## 2014-01-20 MED ORDER — MORPHINE SULFATE 2 MG/ML IJ SOLN
2.0000 mg | Freq: Once | INTRAMUSCULAR | Status: AC
  Administered 2014-01-20: 2 mg via INTRAVENOUS

## 2014-01-20 NOTE — Progress Notes (Signed)
CSW (Clinical Education officer, museum) prepared pt dc packet and placed with shadow chart. CSW arranged non-emergent ambulance transport. Pt, pt family, pt nurse, and facility informed. Pt daughter thanked CSW and confirmed she had no questions or concerns about transport or returning to SNF. CSW signing off.   [place], [place]

## 2014-01-20 NOTE — Progress Notes (Signed)
   Patient Name: CAYA SOBERANIS Date of Encounter: 01/20/2014     Principal Problem:   Acute respiratory failure with hypoxia Active Problems:   HTN (hypertension)   Atrial thrombus on TEE 11/15/13   Atrial fibrillation with RVR   Acute exacerbation of congestive heart failure   Atrial fibrillation with rapid ventricular response    SUBJECTIVE  Patient sleeping comfortably. Now on morphine gtt and only comfort care. Patient refused to wear face mask yesterday and very verbal about wanting to die. Labs and tele discontinued and only proceeding with morphine and lasix for comfort care.   CURRENT MEDS . amiodarone  400 mg Oral BID  . apixaban  5 mg Oral BID  . ceFEPime (MAXIPIME) IV  1 g Intravenous Q24H  . furosemide  60 mg Intravenous Q12H  . metoprolol tartrate  25 mg Oral BID  . polyethylene glycol  17 g Oral Daily  . sodium chloride  3 mL Intravenous Q12H  . vancomycin  1,500 mg Intravenous Q24H    OBJECTIVE  Filed Vitals:   01/19/14 2055 01/19/14 2123 01/20/14 0016 01/20/14 0442  BP: 100/61 110/63 103/77 103/65  Pulse: 127 116 127 131  Temp: 97.6 F (36.4 C)  97.6 F (36.4 C) 98.5 F (36.9 C)  TempSrc: Oral  Oral Oral  Resp:   30   Height:      Weight:    193 lb 3.2 oz (87.635 kg)  SpO2: 81%  72% 75%    Intake/Output Summary (Last 24 hours) at 01/20/14 0829 Last data filed at 01/20/14 0449  Gross per 24 hour  Intake    953 ml  Output    600 ml  Net    353 ml   Filed Weights   01/18/14 0158 01/19/14 0400 01/20/14 0442  Weight: 185 lb (83.915 kg) 184 lb 8.4 oz (83.7 kg) 193 lb 3.2 oz (87.635 kg)    C     Radiology/Studies   ASSESSMENT AND PLAN  CIELLA OBI is a 80 y.o. female with a history of chronic diastolic CHF, persistent atrial fibrillation, HLD, HTN, carotid artery stenosis, aortic stenosis, recent admission (last week) for acute on chronic diastolic CHF, HCAP and afib with RVR who was re-admitted  with worsening dyspnea. She is found  to have acute PNA with leukocytosis, acute on chronic diastolic CHF and afib with RVR.  Given her continued decline plan for palliation.  Patinet is off of telemetry.  Plan for comfort measures.  Will sign off.      Signed, Angelena Form R PA-C  Case reviewed  I have amended note above by Kathlene November to reflect my findings. Will be available as needed.  Dorris Carnes

## 2014-01-20 NOTE — Clinical Social Work Psychosocial (Signed)
     Clinical Social Work Department BRIEF PSYCHOSOCIAL ASSESSMENT 01/20/2014  Patient:  Julie Haas, Julie Haas     Account Number:  [id number]     Admit date:  01/18/2014  Clinical Social Worker:  Adair Laundry  Date/Time:  01/20/2014 11:11 AM  Referred by:  Physician  Date Referred:  01/20/2014 Referred for  SNF Placement   Other Referral:   Interview type:  Other - See comment Other interview type:   Spoke briefly with pt daughter; Spoke with hospice nurse liaison    PSYCHOSOCIAL DATA Living Status:  FACILITY Admitted from facility:  [facility] Level of care:  [place] Primary support name:  Manahil Vanzile Primary support relationship to patient:  CHILD, ADULT Degree of support available:   Pt has strong support    CURRENT CONCERNS Current Concerns  Post-Acute Placement   Other Concerns:    SOCIAL WORK ASSESSMENT / PLAN CSW familiar with pt from previous admissions. Pt admitted from [facility] and active with hospice. During previous admission, pt expressed wishes to remain at [facility] with hospice. CSW made aware by hospice liaison that family has chosen for pt to dc back to facility today. CSW did confirm this with pt daughter. CSW left voicemail for [facility] to notify. Hospice liaison also confirmed she has spoken with facility and confirmed they have necessary medications. CSW had limited contact with pt and pt family as hospice liaison and social worker already coordinating and providing support. CSW to confirm pt daughter has no questions for hospital CSW prior to arranging transport.   Assessment/plan status:  Psychosocial Support/Ongoing Assessment of Needs Other assessment/ plan:   Information/referral to community resources:   None needed    PATIENTS/FAMILYS RESPONSE TO PLAN OF CARE: Pt with limite responsiveness at time of assessment. Pt daughter at bedside and agreeable to plan.    [place], [place]

## 2014-01-20 NOTE — Progress Notes (Signed)
Report called and given to [facility] [place] at [facility], all questions and concerns answered and addressed. Will discontinue IV upon transports arrival.

## 2014-01-20 NOTE — Progress Notes (Signed)
Follow-up: Multiple calls from RN tonight regarding pt's wish to "die". Pt refuses to wear face mask required to maintain 02 sats > 90%. Son and daughter were called by sitter d/t pt's frustration and wish to be made comfortable. Notified again by RN that family wishes that focus of pt's care should be comfort. Went to bedside to speak with son Eula Fried) and daughter Rod Holler) regarding pt's (and their) wish for full comfort care at this time. Options discussed and they have opted for the morphine drip. They do not want to force her to wear the mask if she declines. They also want lab draws d/c'd. Pt, though lethargic is able to respond and indicate what she wants. It was agreed that other medications would continue for now and that the rounding MD would re-address in am. I explained that a Palliative Care Consult would be placed for tomorrow and they were agreeable. Orders have been placed to comply w/ pt and family wishes for full comfort. Will start Morphine drip at 2mg /hr and titrate as needed for comfort. AM labs d/c'd as well as continuous telemetry monitoring. Will make changes to plan to assure pt's comfort. Palliative care consult request placed.   Jeryl [place], NP-C [facility] Pager [phone]

## 2014-01-20 NOTE — Discharge Summary (Signed)
PATIENT DETAILS Name: Julie Haas Age: 80 y.o. Sex: female Date of Birth: [birth year] MRN: [id number]. Admitting Physician: Verlee Monte, MD TZG:[id number], Viviann Spare, MD  Admit Date: 01/18/2014 Discharge date: 01/20/2014  Recommendations for Outpatient Follow-up:  1. Full comfort measures, do not re-hospitalize unless comfort needs cannot be met at the facility.  PRIMARY DISCHARGE DIAGNOSIS:  Principal Problem:   Acute respiratory failure with hypoxia Active Problems:   HTN (hypertension)   Atrial thrombus on TEE 11/15/13   Atrial fibrillation with RVR   Acute exacerbation of congestive heart failure   Atrial fibrillation with rapid ventricular response      PAST MEDICAL HISTORY: Past Medical History  Diagnosis Date  . Hyperlipidemia   . Retinal detachment   . Carotid artery stenosis 03/24/2012  . HTN (hypertension) 06/27/2009  . Other generalized ischemic cerebrovascular disease 06/27/2009  . Vitamin D deficiency 05/16/2009  . Family history of sudden cardiac death (SCD) 03-Dec-2008  . Varicose veins of lower extremities with inflammation 03/16/2003  . Atrial fibrillation, persistent   . Chronic diastolic CHF (congestive heart failure)     DISCHARGE MEDICATIONS: Current Discharge Medication List    START taking these medications   Details  atropine 1 % ophthalmic solution Place 2 drops under the tongue 4 (four) times daily. Qty: 2 mL, Refills: 12    LORazepam (ATIVAN) 2 MG/ML concentrated solution Take 0.5 mLs (1 mg total) by mouth every 4 (four) hours as needed for anxiety. Qty: 30 mL, Refills: 0    scopolamine (TRANSDERM-SCOP) 1 MG/3DAYS Place 1 patch (1.5 mg total) onto the skin every 3 (three) days. Qty: 10 patch, Refills: 0      CONTINUE these medications which have CHANGED   Details  albuterol (PROVENTIL) (2.5 MG/3ML) 0.083% nebulizer solution Take 3 mLs (2.5 mg total) by nebulization every 2 (two) hours as needed for wheezing or shortness of breath. Qty: 75  mL, Refills: 0    !! Morphine Sulfate (MORPHINE CONCENTRATE) 10 mg / 0.5 ml concentrated solution Take 0.25 mLs (5 mg total) by mouth every 2 (two) hours as needed for severe pain. Qty: 30 mL, Refills: 0    !! Morphine Sulfate (MORPHINE CONCENTRATE) 10 MG/0.5ML SOLN concentrated solution Take 0.5 mLs (10 mg total) by mouth every 4 (four) hours. Qty: 30 mL, Refills: 0     !! - Potential duplicate medications found. Please discuss with provider.    CONTINUE these medications which have NOT CHANGED   Details  furosemide (LASIX) 80 MG tablet Take 80 mg by mouth 2 (two) times daily.      STOP taking these medications     amiodarone (PACERONE) 400 MG tablet      apixaban (ELIQUIS) 5 MG TABS tablet      guaiFENesin-dextromethorphan (ROBITUSSIN DM) 100-10 MG/5ML syrup      levalbuterol (XOPENEX) 0.63 MG/3ML nebulizer solution      metoprolol tartrate (LOPRESSOR) 25 MG tablet      omeprazole (PRILOSEC) 20 MG capsule      OXYGEN      polyethylene glycol (MIRALAX / GLYCOLAX) packet      potassium chloride SA (K-DUR,KLOR-CON) 20 MEQ tablet      promethazine (PHENERGAN) 12.5 MG tablet      senna (SENOKOT) 8.6 MG tablet      ipratropium (ATROVENT) 0.02 % nebulizer solution      levofloxacin (LEVAQUIN) 750 MG tablet         ALLERGIES:   Allergies  Allergen Reactions  . Codeine  Unknown - currently tolerating morphine  . Cucumber Extract Nausea And Vomiting  . Diuretic [Buchu-Cornsilk-Ch Grass-Hydran]     Unknown  . Lipitor [Atorvastatin Calcium]     Unknown  . Penicillins Other (See Comments)    unknown  . Pravachol     Unknown    BRIEF HPI:  See H&P, Labs, Consult and Test reports for all details in brief, patient was admitted for evaluation of worsening shortness of breath.   CONSULTATIONS:   cardiology  PERTINENT RADIOLOGIC STUDIES: Dg Chest Port 1 View  01/18/2014   CLINICAL DATA:  Shortness of breath.  Initial encounter.  EXAM: PORTABLE CHEST - 1 VIEW   COMPARISON:  01/07/2014  FINDINGS: Cardiomegaly with bilateral airspace opacities again noted.  Increasing a left lower lung consolidation/atelectasis noted.  Bilateral pleural effusions are again identified, likely increased on the left.  There is no evidence of pneumothorax.  IMPRESSION: Increased left lower lung consolidations/atelectasis.  Unchanged bilateral airspace opacities, cardiomegaly and small right pleural effusion. Question increased left pleural effusion.   Electronically Signed   By: Hassan Rowan M.D.   On: 01/18/2014 02:26   Dg Chest Port 1 View  01/07/2014   CLINICAL DATA:  Shortness of breath and palpitations. Recent hospitalization for new onset atrial fibrillation. Subsequent encounter.  EXAM: PORTABLE CHEST - 1 VIEW  COMPARISON:  11/17/2013 and 11/10/2013 radiographs; CT 10/11/2013.  FINDINGS: 2255 hr. There is stable cardiomegaly and aortic atherosclerosis. Small bilateral pleural effusions are present. There are bilateral airspace opacities which are similar to the most recent examination. These have increased from radiographs obtained 3 months ago. These were evaluated by CT in August and appear progressive since then. Left glenohumeral degenerative changes are present. There are no acute osseous findings. Several telemetry leads overlie the chest.  IMPRESSION: Persistent diffuse bilateral airspace opacities and bilateral pleural effusions, similar to most recent examination of 6 weeks ago, but progressive over the last several months. Although there may be a component of edema, the findings are suspicious for an infectious or inflammatory process. Correlate clinically.   Electronically Signed   By: Camie Patience M.D.   On: 01/07/2014 23:11     PERTINENT LAB RESULTS: CBC:  Recent Labs  01/18/14 0207 01/19/14 0408  WBC 18.1* 19.5*  HGB 10.8* 10.5*  HCT 33.9* 33.7*  PLT 521* 434*   CMET CMP     Component Value Date/Time   NA 141 01/19/2014 0408   NA 137 01/12/2014   K 4.3  01/19/2014 0408   CL 92* 01/19/2014 0408   CO2 36* 01/19/2014 0408   GLUCOSE 84 01/19/2014 0408   BUN 32* 01/19/2014 0408   BUN 11 01/12/2014   CREATININE 1.18* 01/19/2014 0408   CREATININE 0.7 01/12/2014   CREATININE 0.71 07/20/2011 1510   CALCIUM 8.5 01/19/2014 0408   PROT 7.1 01/19/2014 0408   ALBUMIN 2.1* 01/19/2014 0408   AST 35 01/19/2014 0408   ALT 23 01/19/2014 0408   ALKPHOS 82 01/19/2014 0408   BILITOT 0.8 01/19/2014 0408   GFRNONAA 41* 01/19/2014 0408   GFRAA 47* 01/19/2014 0408    GFR Estimated Creatinine Clearance: 36.7 mL/min (by C-G formula based on Cr of 1.18). No results for input(s): LIPASE, AMYLASE in the last 72 hours.  Recent Labs  01/18/14 0207  TROPONINI <0.30   Invalid input(s): POCBNP No results for input(s): DDIMER in the last 72 hours.  Recent Labs  01/18/14 1000  HGBA1C 5.6   No results for input(s): CHOL, HDL, LDLCALC,  TRIG, CHOLHDL, LDLDIRECT in the last 72 hours.  Recent Labs  01/18/14 1000  TSH 2.550   No results for input(s): VITAMINB12, FOLATE, FERRITIN, TIBC, IRON, RETICCTPCT in the last 72 hours. Coags: No results for input(s): INR in the last 72 hours.  Invalid input(s): PT Microbiology: Recent Results (from the past 240 hour(s))  MRSA PCR Screening     Status: None   Collection Time: 01/18/14  9:05 AM  Result Value Ref Range Status   MRSA by PCR NEGATIVE NEGATIVE Final    Comment:        The GeneXpert MRSA Assay (FDA approved for NASAL specimens only), is one component of a comprehensive MRSA colonization surveillance program. It is not intended to diagnose MRSA infection nor to guide or monitor treatment for MRSA infections.   Culture, blood (routine x 2)     Status: None (Preliminary result)   Collection Time: 01/19/14  3:03 PM  Result Value Ref Range Status   Specimen Description BLOOD RIGHT HAND  Final   Special Requests BOTTLES DRAWN AEROBIC AND ANAEROBIC 5 CC  Final   Culture  Setup Time   Final     01/19/2014 21:16 Performed at [facility]    Culture   Final           BLOOD CULTURE RECEIVED NO GROWTH TO DATE CULTURE WILL BE HELD FOR 5 DAYS BEFORE ISSUING A FINAL NEGATIVE REPORT Performed at [facility]    Report Status PENDING  Incomplete  Culture, blood (routine x 2)     Status: None (Preliminary result)   Collection Time: 01/19/14  3:15 PM  Result Value Ref Range Status   Specimen Description BLOOD LEFT HAND  Final   Special Requests BOTTLES DRAWN AEROBIC AND ANAEROBIC 5 CC  Final   Culture  Setup Time   Final    01/19/2014 21:17 Performed at [facility]    Culture   Final           BLOOD CULTURE RECEIVED NO GROWTH TO DATE CULTURE WILL BE HELD FOR 5 DAYS BEFORE ISSUING A FINAL NEGATIVE REPORT Performed at [facility]    Report Status PENDING  Incomplete     BRIEF HOSPITAL COURSE:   Principal Problem:   Acute respiratory failure with hypoxia:multifactorial-HCAP/Acute Diastolic Failure. Started on IV Lasix, IV Abx. Unfortunately continued to deteriorate, this MD spoke with daughter and other family members at bedside, explained poor overall prognoses. Family/ patient decided to transition to comfort care measures, started on IV Morphine gtt for comfort. Family met with [facility] who already was following the patient prior to this admission, family at this time desires to transfer back to [facility] for full comfort measures. All Abx stopped, continue Lasix if able to take oral intake. Focus primarily on comfort and symptom management.   Active Problems:   Acute Diastolic ZOX:WRUEA with significant volumed overload. Started on IV Lasix-see above. Full comfort measures as above    Atrial fibrillation with VWU:[id number] on Cardizem gtt but could not tolerated given low BP. Started on Amiodarone, cards consulted, briefly contemplated DCCV Cardioversion once medically better,now deteriorating. Family transitioning to full comfort measures. Since  focus on comfort, have stopped Eliquis, Amiodarone and other medications.     Ethics:very poor overall prognoses. On Morphine gtt, unresponsive, mottled today. Dying actively. [facility] following patient, RN at bedside, family wishes she go back [facility] with full comfort measures. DNR/DNI. Full comfort measures, no rehospitalization unless comfort needs  cannot be met at the facility.  TODAY-DAY OF DISCHARGE:  Subjective:   Julie Haas today remains unresponsive.  Objective:   Blood pressure 103/65, pulse 131, temperature 98.5 F (36.9 C), temperature source Oral, resp. rate 30, height 5' 4"  (1.626 m), weight 87.635 kg (193 lb 3.2 oz), SpO2 75 %.  Intake/Output Summary (Last 24 hours) at 01/20/14 1113 Last data filed at 01/20/14 0449  Gross per 24 hour  Intake    878 ml  Output    600 ml  Net    278 ml   Filed Weights   01/18/14 0158 01/19/14 0400 01/20/14 0442  Weight: 83.915 kg (185 lb) 83.7 kg (184 lb 8.4 oz) 87.635 kg (193 lb 3.2 oz)   DISCHARGE CONDITION: Stable  DISPOSITION: SNF with comfort measures  DISCHARGE INSTRUCTIONS:    Activity:  As tolerated   Diet recommendation: Heart Healthy diet  Discharge Instructions    Diet - low sodium heart healthy    Complete by:  As directed      Increase activity slowly    Complete by:  As directed            Follow-up Information    Follow up with GREEN, Viviann Spare, MD.   Specialty:  Internal Medicine   Why:  As needed   Contact information:   [place] [address] [phone]       Total Time spent on discharge equals 45 minutes.  SignedOren Binet 01/20/2014 11:13 AM

## 2014-01-20 NOTE — Progress Notes (Signed)
Inpatient RN visit- [facility] 3W Room 24 -[facility] RN Visit-Karen Alford [place] RN Related admission to [facility] diagnosis of Heart Failure  Pt is DNR code.     Pt seen with [facility] LCSW Cathy Hoff. Pt with significant decline, hands and feet cool/cyanotic, knees with mottling. Respirations desynchronosis. Patient did react to mouth care by opening her mouth and allowing the swab to brush her teeth. She had no verbal response. Patient appears to be actively dying.  Per chart review Morphine gtt started overnight for treatment of dyspnea at 2mg . Family and patient have chosen to pursue full comfort. After much discussion with patients daughter Rod Holler, present, and son Thorton via phone, plan is for patient to discharge as soon as possible back to [facility] for end of life care. Plan discussed with attending physician Dr. Sloan Leiter, prescriptions written for liquid morphine gv 10mg  po/sl Q 4 hrs and 5 mg po/sl q 2 hrs PRN for dyspnea/pain. Pt to discharge with additional comfort medication prescriptions for liquid ativan, scopolamine patch and atropine. Family aware that patient will transition from the continuous morphine to the liquid upon transfer. Education provided regarding change of administration, Rod Holler voiced her understanding. Writer contacted charge [employer], at [facility] to confirm that the liquid morphine was available at the facility. Staff RN Josh given number to call report  [phone] ask for [facility]) prior to patient's transfer. All above information shared with CMRN Hassan Rowan and CSW [facility]. [facility] to arrange transport. [facility] facility team notified of patient's planned return. Patient's home medication list, transfer summary and OOf DNR in place is on shadow chart.  Please call [facility] @ [phone]- with any hospice needs.   Thank you. Tracey Harries, RN  [facility]  Hospice Liaison  [phone])

## 2014-01-20 NOTE — Progress Notes (Signed)
Pt anxious and c/o shortness of breath xanax given @2145  . Pt continued to c/o difficulty breathing to sitter, nurse made aware, pt O2 sats in the 70's, non rebreather applied. NP K.Schorr made aware, order given for xopenex one time order,given at 2222 and 2 mg morphine IV one time order given at 2231.   Pt continued to c/o of shortness of breath and stated she wanted to die, sitter called son. Son and daughter arrived at hospital and NP K. Schorr made aware that patient is refusing to wear non rebreather mask, with multiple attempts from family and nurse to reapply. Patient wishes respected and nasal cannula applied 5L O2 sat 70's-80's.  NP K.Schorr made aware.  At [facility] NP K. Schorr discussed care with patient family and agreed on using morphine drip for comfort and discontinue all invasive care.  RN instructed by NP K.Schorr to continue with IV lasix as scheduled to assist with patient comfort.   Orders given to discontinue telemetry at 0100. Morphine drip going at 2mg /hr,patient comfortable,Family and sitter at the bedside. Will continue to monitor patient.

## 2014-01-21 ENCOUNTER — Encounter: Payer: Self-pay | Admitting: Internal Medicine

## 2014-01-25 LAB — CULTURE, BLOOD (ROUTINE X 2)
Culture: NO GROWTH
Culture: NO GROWTH

## 2014-02-09 ENCOUNTER — Ambulatory Visit: Payer: Medicare Other | Admitting: Cardiology

## 2014-02-18 DEATH — deceased

## 2014-03-03 ENCOUNTER — Encounter (HOSPITAL_COMMUNITY): Payer: Self-pay | Admitting: Internal Medicine

## 2014-03-12 NOTE — Progress Notes (Signed)
error 

## 2015-08-21 IMAGING — CR DG CHEST 1V PORT
1 series · 1 of 1 positions shown · non-contrast
Comparison: Chest radiograph performed 09/26/2013

CLINICAL DATA: Shortness of breath.

EXAM:
PORTABLE CHEST - 1 VIEW

[AP]
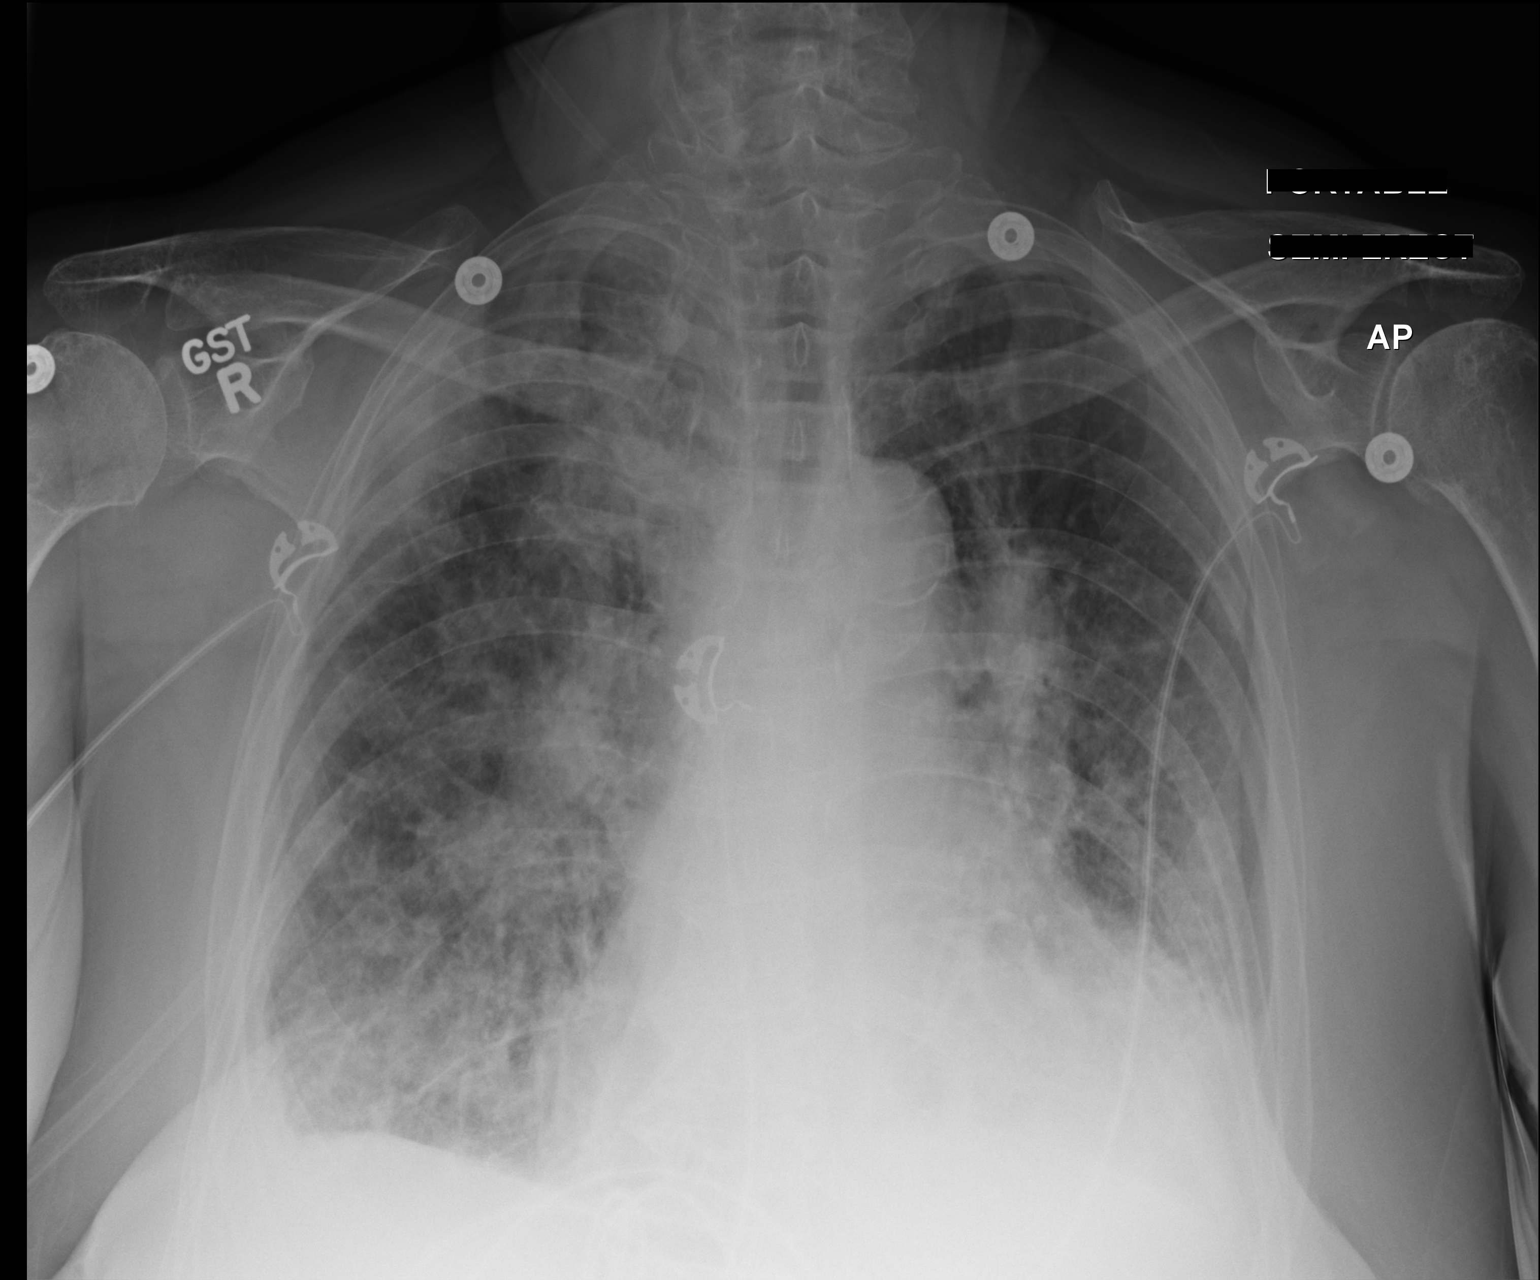

[1 of 1 positions shown; findings below may reference images not displayed]

FINDINGS: The lungs are well-aerated. Vascular congestion is noted, with
diffuse bilateral airspace opacities, possibly reflecting multifocal
pneumonia or pulmonary edema. Small bilateral pleural effusions are
seen. No pneumothorax is identified.

The cardiomediastinal silhouette is mildly enlarged. No acute
osseous abnormalities are seen.
IMPRESSION: Vascular congestion and mild cardiomegaly, with diffuse bilateral
airspace opacities, possibly reflecting multifocal pneumonia or
pulmonary edema. Small bilateral pleural effusions seen.

## 2015-09-04 IMAGING — CT CT CHEST W/O CM
2 of 5 series · 4 of 36 positions shown, 5 images · non-contrast
Comparison: CT 01/23/2009 and series if recent chest radiographs
September 2013.

CLINICAL DATA: Recurrent pneumonia.

EXAM:
CT CHEST WITHOUT CONTRAST
TECHNIQUE: Multidetector CT imaging of the chest was performed following the
standard protocol without IV contrast..

[Series 206: coronals · coronal · 0.50mm/px · 1 of 86 slices shown (1 of 2)]
[im 43/86  lung]
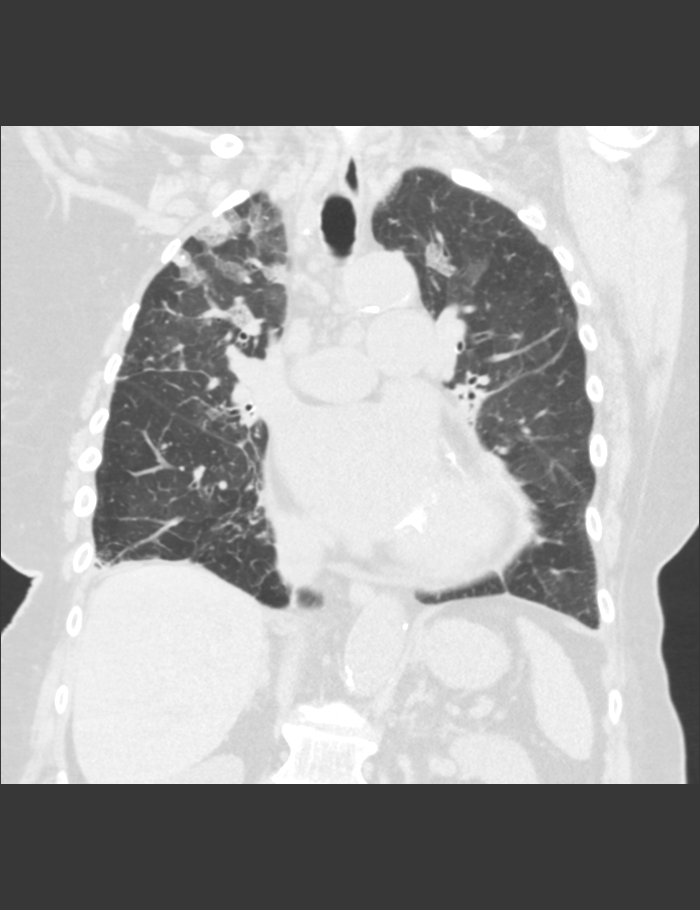

[Series 207: coronals · coronal · 0.50mm/px · 3 of 82 slices shown, 4 images (2 of 2)]
[im 1/82  mediastinal]
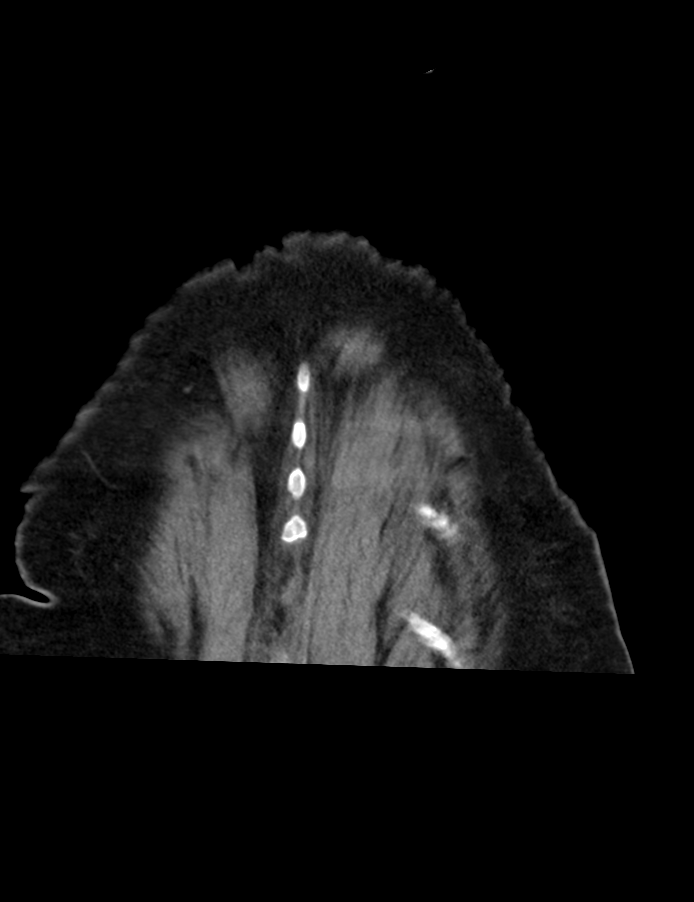
[im 1/82  lung]
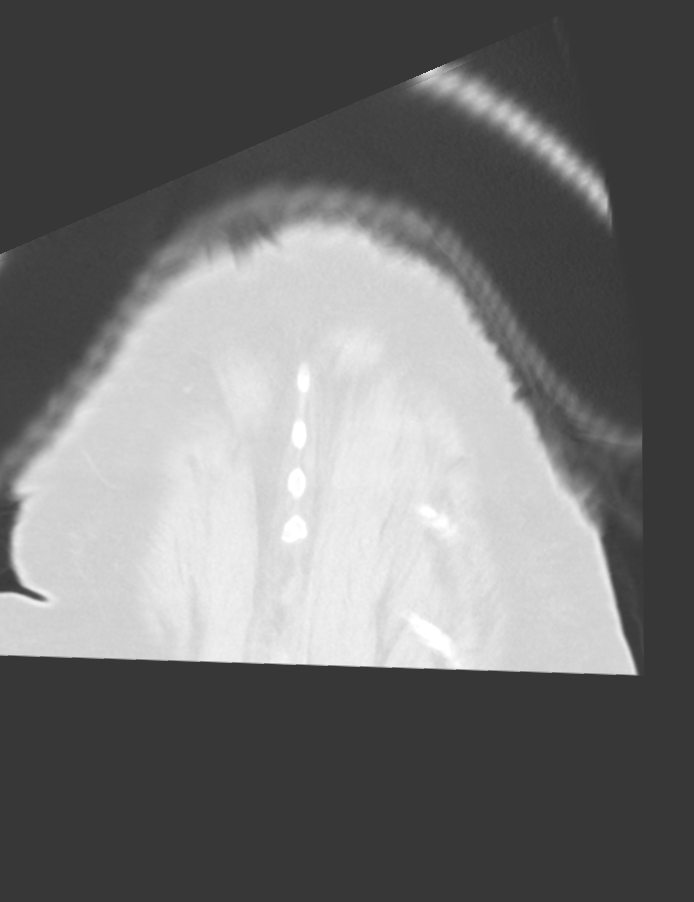
[im 41/82  lung]
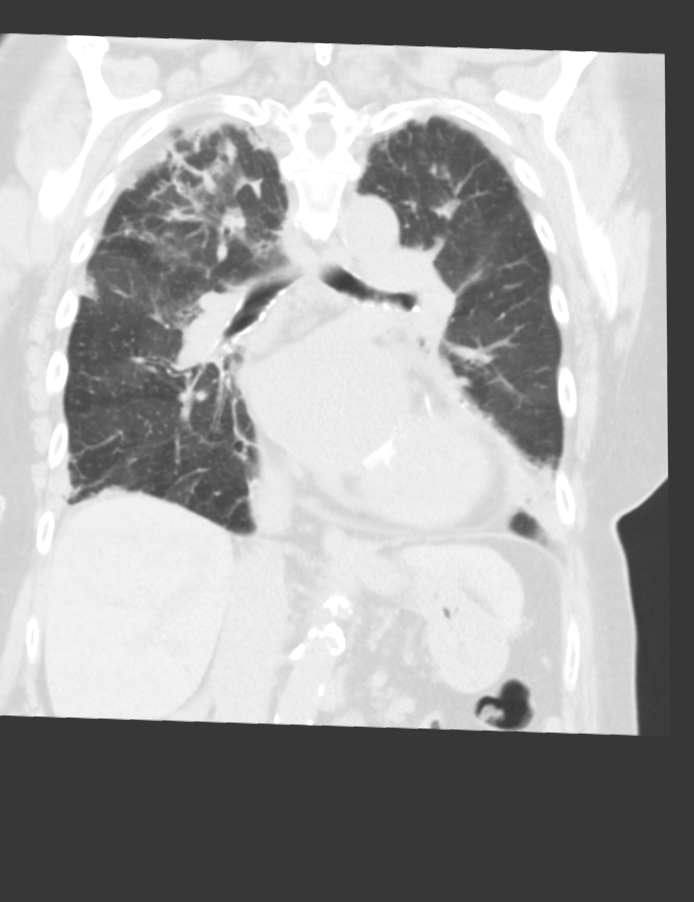
[im 82/82  lung]
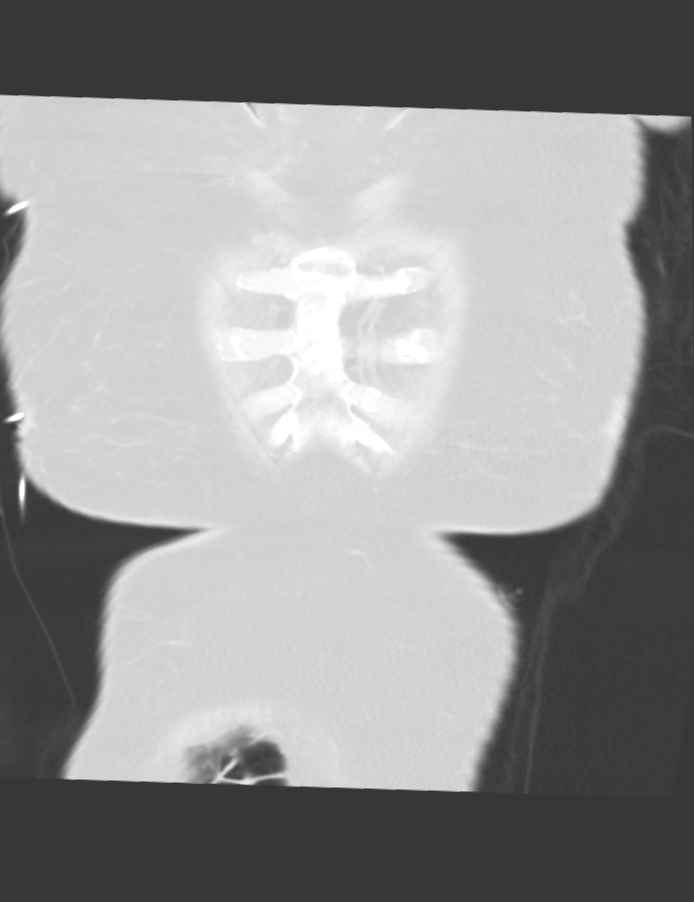

[4 of 36 positions shown; findings below may reference images not displayed]

FINDINGS: Lungs are adequately inflated demonstrate a very small amount of
bilateral pleural fluid. There is a patchy bilateral airspace
process with some nodularity worse over the right upper lobe.
Minimal patchy bronchiectatic change over the lingula, posterior
right upper lobe and left apex.

Mild cardiomegaly with a very small amount of pericardial fluid.
There is moderate calcific plaque involving the left main, left and
descending and lateral circumflex coronary arteries. Calcification
over the mitral valve annulus with mild left atrial enlargement mild
prominence of the main pulmonary arteries. There is a 1.3 cm
pretracheal lymph node slightly larger. There is calcified plaque
involving the thoracic aorta. Remaining mediastinal structures are
unremarkable.

Images through the upper abdomen demonstrated 1.1 cm hypodensity
over the left dome of the liver unchanged likely a cyst. Evidence of
a prior cholecystectomy. Remainder of the exam is unchanged
IMPRESSION: Patchy bilateral airspace process with nodularity most prominent
over the right upper lobe likely due to infection. Patchy
bronchiectatic change as described. Small bilateral pleural
effusions. 1.3 cm pretracheal lymph node likely reactive. Recommend
followup CT in 6-8 weeks to re-evaluate the areas of nodularity.

Cardiomegaly with calcification of the mitral valve annulus and left
atrial enlargement. Atherosclerotic coronary artery disease. Small
amount of pericardial fluid.

1.1 cm liver cyst unchanged.

## 2015-10-03 IMAGING — CR DG CHEST 1V PORT
1 series · 1 of 1 positions shown · non-contrast
Comparison: Prior radiograph from 10/29/2013

CLINICAL DATA: Shortness of breath

EXAM:
PORTABLE CHEST - 1 VIEW

[AP]
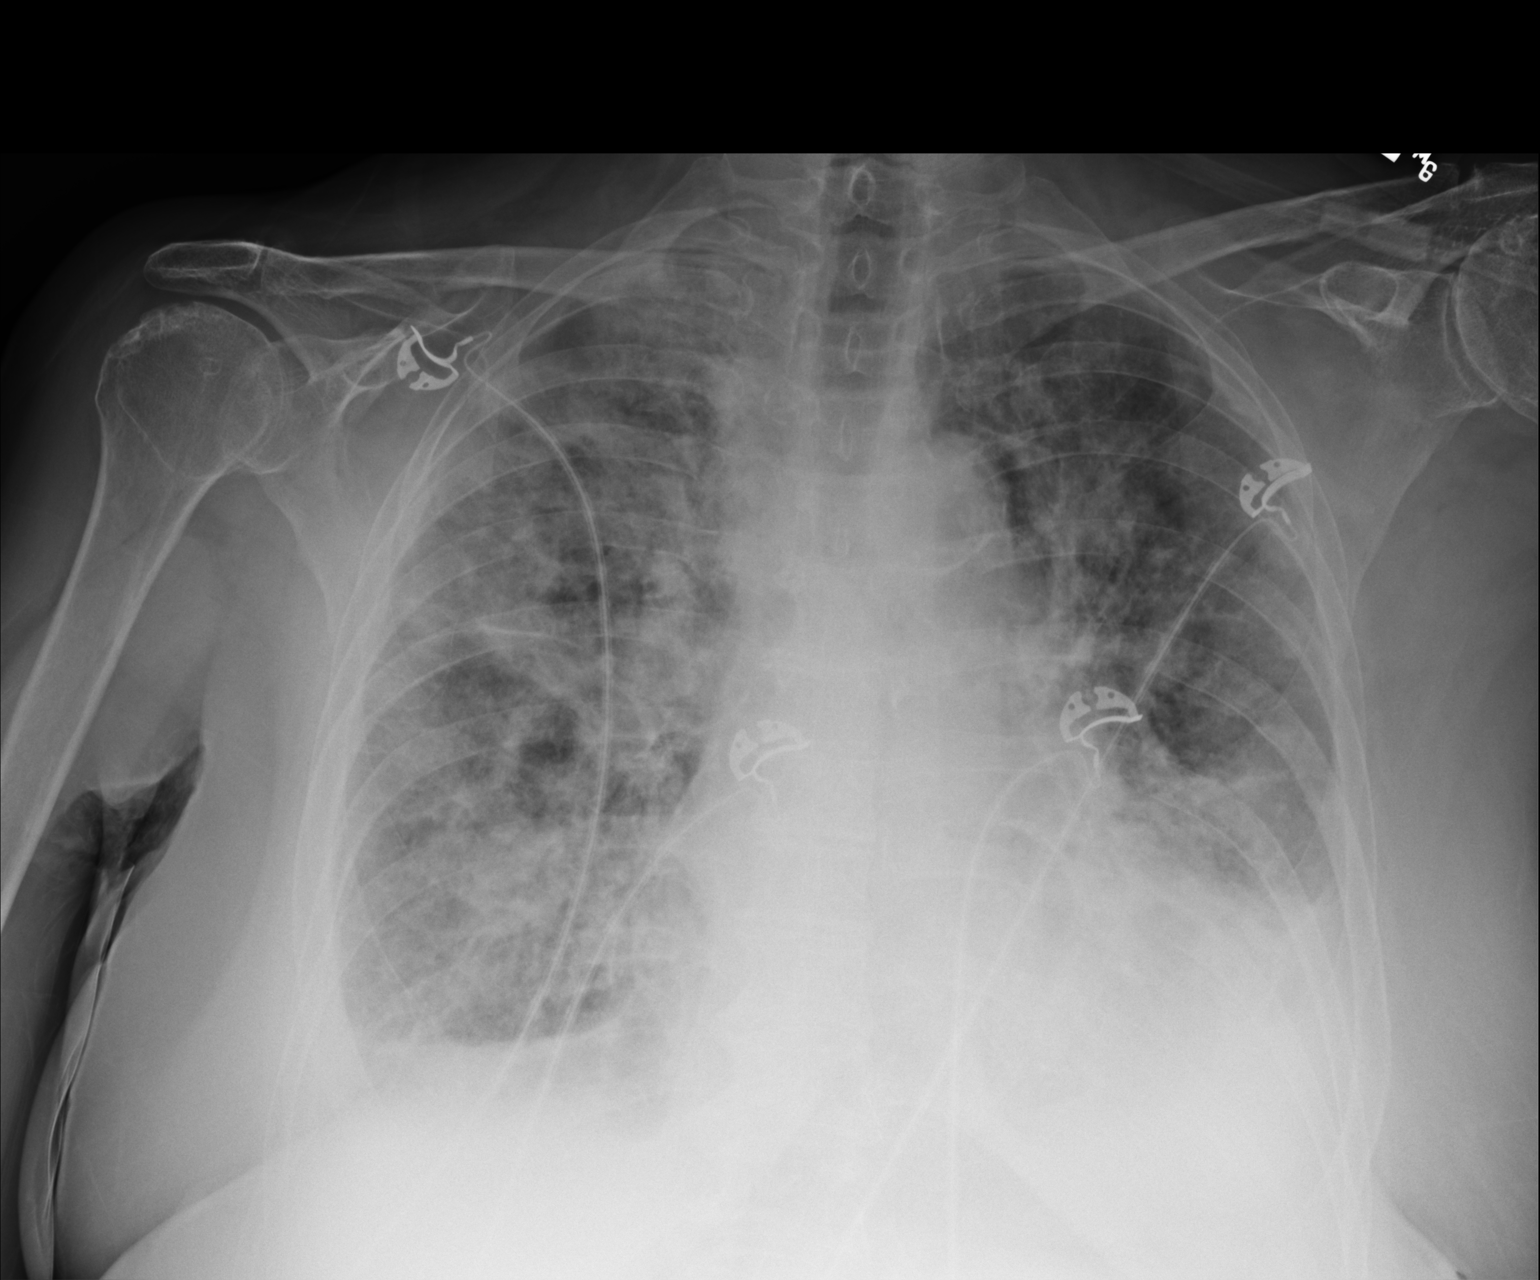

[1 of 1 positions shown; findings below may reference images not displayed]

FINDINGS: Cardiomegaly is grossly stable relative to prior exam. Mediastinal
silhouette within normal limits. Atherosclerotic calcifications
present within the aortic arch.

Lungs are mildly hypoinflated. Small bilateral pleural effusions are
present, left larger than right. There are widespread multi focal
parenchymal patchy opacities throughout both lungs, right worse than
left. Diffuse interstitial thickening with pulmonary vascular
congestion is present. Altogether, these findings are favored to
reflect diffuse pulmonary edema, although superimposed infection is
not entirely excluded. Bibasilar atelectasis is present.

No pneumothorax.

No acute osseus abnormality.
IMPRESSION: 1. Extensive multi focal irregular patchy airspace opacities
throughout the bilateral lungs, right greater than left, worsened as
compared to most recent radiograph from 10/29/2013. Findings are
favored to reflect moderate diffuse pulmonary edema, although
superimposed infection/pneumonia could also be considered in the
correct clinical setting.
2. Bilateral pleural effusions, left greater than right, with
associated bibasilar atelectasis.
3. Stable cardiomegaly.

## 2016-12-12 NOTE — Telephone Encounter (Signed)
No additional notes
